# Patient Record
Sex: Female | Born: 1935 | Race: White | Hispanic: No | Smoking: Never smoker
Health system: Southern US, Community
[De-identification: ages and names within clinical notes are randomized; demographics above are authoritative.]

## PROBLEM LIST (undated history)

## (undated) DIAGNOSIS — I6789 Other cerebrovascular disease: Secondary | ICD-10-CM

## (undated) DIAGNOSIS — R159 Full incontinence of feces: Secondary | ICD-10-CM

## (undated) DIAGNOSIS — K623 Rectal prolapse: Secondary | ICD-10-CM

## (undated) DIAGNOSIS — Z860101 Personal history of adenomatous and serrated colon polyps: Secondary | ICD-10-CM

## (undated) DIAGNOSIS — I10 Essential (primary) hypertension: Secondary | ICD-10-CM

## (undated) DIAGNOSIS — Z87440 Personal history of urinary (tract) infections: Secondary | ICD-10-CM

## (undated) DIAGNOSIS — E039 Hypothyroidism, unspecified: Secondary | ICD-10-CM

## (undated) DIAGNOSIS — E7800 Pure hypercholesterolemia, unspecified: Secondary | ICD-10-CM

## (undated) DIAGNOSIS — F329 Major depressive disorder, single episode, unspecified: Secondary | ICD-10-CM

## (undated) DIAGNOSIS — I639 Cerebral infarction, unspecified: Secondary | ICD-10-CM

## (undated) DIAGNOSIS — K760 Fatty (change of) liver, not elsewhere classified: Secondary | ICD-10-CM

## (undated) DIAGNOSIS — F32A Depression, unspecified: Secondary | ICD-10-CM

## (undated) DIAGNOSIS — Z86010 Personal history of colonic polyps: Secondary | ICD-10-CM

## (undated) DIAGNOSIS — G459 Transient cerebral ischemic attack, unspecified: Secondary | ICD-10-CM

## (undated) DIAGNOSIS — K648 Other hemorrhoids: Secondary | ICD-10-CM

## (undated) HISTORY — DX: Other hemorrhoids: K64.8

## (undated) HISTORY — PX: CHOLECYSTECTOMY: SHX55

## (undated) HISTORY — DX: Rectal prolapse: K62.3

## (undated) HISTORY — DX: Hypothyroidism, unspecified: E03.9

## (undated) HISTORY — DX: Personal history of adenomatous and serrated colon polyps: Z86.0101

## (undated) HISTORY — DX: Fatty (change of) liver, not elsewhere classified: K76.0

## (undated) HISTORY — DX: Transient cerebral ischemic attack, unspecified: G45.9

## (undated) HISTORY — DX: Full incontinence of feces: R15.9

## (undated) HISTORY — DX: Personal history of colonic polyps: Z86.010

## (undated) HISTORY — DX: Cerebral infarction, unspecified: I63.9

## (undated) HISTORY — PX: COLON SURGERY: SHX602

## (undated) HISTORY — PX: BACK SURGERY: SHX140

---

## 1998-02-06 ENCOUNTER — Other Ambulatory Visit: Admission: RE | Admit: 1998-02-06 | Discharge: 1998-02-06 | Payer: Self-pay | Admitting: Obstetrics and Gynecology

## 1999-04-25 ENCOUNTER — Other Ambulatory Visit: Admission: RE | Admit: 1999-04-25 | Discharge: 1999-04-25 | Payer: Self-pay | Admitting: Obstetrics and Gynecology

## 2000-01-14 ENCOUNTER — Emergency Department (HOSPITAL_COMMUNITY): Admission: EM | Admit: 2000-01-14 | Discharge: 2000-01-14 | Payer: Self-pay

## 2000-01-20 ENCOUNTER — Other Ambulatory Visit: Admission: RE | Admit: 2000-01-20 | Discharge: 2000-01-20 | Payer: Self-pay | Admitting: Obstetrics and Gynecology

## 2000-01-22 ENCOUNTER — Encounter: Payer: Self-pay | Admitting: Obstetrics and Gynecology

## 2000-01-22 ENCOUNTER — Encounter: Admission: RE | Admit: 2000-01-22 | Discharge: 2000-01-22 | Payer: Self-pay | Admitting: Obstetrics and Gynecology

## 2001-03-24 ENCOUNTER — Other Ambulatory Visit: Admission: RE | Admit: 2001-03-24 | Discharge: 2001-03-24 | Payer: Self-pay | Admitting: Obstetrics and Gynecology

## 2001-11-24 ENCOUNTER — Encounter: Payer: Self-pay | Admitting: Otolaryngology

## 2001-11-24 ENCOUNTER — Encounter: Admission: RE | Admit: 2001-11-24 | Discharge: 2001-11-24 | Payer: Self-pay | Admitting: Otolaryngology

## 2002-04-25 ENCOUNTER — Other Ambulatory Visit: Admission: RE | Admit: 2002-04-25 | Discharge: 2002-04-25 | Payer: Self-pay | Admitting: Gynecology

## 2003-04-15 ENCOUNTER — Emergency Department (HOSPITAL_COMMUNITY): Admission: EM | Admit: 2003-04-15 | Discharge: 2003-04-15 | Payer: Self-pay | Admitting: Emergency Medicine

## 2003-04-27 ENCOUNTER — Ambulatory Visit (HOSPITAL_COMMUNITY): Admission: RE | Admit: 2003-04-27 | Discharge: 2003-04-27 | Payer: Self-pay | Admitting: *Deleted

## 2003-04-27 ENCOUNTER — Encounter (INDEPENDENT_AMBULATORY_CARE_PROVIDER_SITE_OTHER): Payer: Self-pay | Admitting: Specialist

## 2003-04-30 ENCOUNTER — Emergency Department (HOSPITAL_COMMUNITY): Admission: EM | Admit: 2003-04-30 | Discharge: 2003-04-30 | Payer: Self-pay | Admitting: Emergency Medicine

## 2003-04-30 ENCOUNTER — Encounter: Payer: Self-pay | Admitting: Emergency Medicine

## 2004-05-01 ENCOUNTER — Other Ambulatory Visit: Admission: RE | Admit: 2004-05-01 | Discharge: 2004-05-01 | Payer: Self-pay | Admitting: Gynecology

## 2004-05-29 ENCOUNTER — Encounter: Admission: RE | Admit: 2004-05-29 | Discharge: 2004-05-29 | Payer: Self-pay | Admitting: Otolaryngology

## 2006-02-12 ENCOUNTER — Encounter (INDEPENDENT_AMBULATORY_CARE_PROVIDER_SITE_OTHER): Payer: Self-pay | Admitting: *Deleted

## 2006-02-13 ENCOUNTER — Encounter (INDEPENDENT_AMBULATORY_CARE_PROVIDER_SITE_OTHER): Payer: Self-pay | Admitting: Specialist

## 2006-02-13 ENCOUNTER — Inpatient Hospital Stay (HOSPITAL_COMMUNITY): Admission: EM | Admit: 2006-02-13 | Discharge: 2006-02-16 | Payer: Self-pay | Admitting: Emergency Medicine

## 2006-06-29 ENCOUNTER — Other Ambulatory Visit: Admission: RE | Admit: 2006-06-29 | Discharge: 2006-06-29 | Payer: Self-pay | Admitting: Gynecology

## 2006-09-01 HISTORY — PX: APPENDECTOMY: SHX54

## 2006-12-21 ENCOUNTER — Ambulatory Visit (HOSPITAL_BASED_OUTPATIENT_CLINIC_OR_DEPARTMENT_OTHER): Admission: RE | Admit: 2006-12-21 | Discharge: 2006-12-21 | Payer: Self-pay | Admitting: Gynecology

## 2006-12-21 ENCOUNTER — Encounter (INDEPENDENT_AMBULATORY_CARE_PROVIDER_SITE_OTHER): Payer: Self-pay | Admitting: Specialist

## 2007-01-14 ENCOUNTER — Emergency Department (HOSPITAL_COMMUNITY): Admission: EM | Admit: 2007-01-14 | Discharge: 2007-01-15 | Payer: Self-pay | Admitting: Emergency Medicine

## 2007-01-16 ENCOUNTER — Inpatient Hospital Stay (HOSPITAL_COMMUNITY): Admission: EM | Admit: 2007-01-16 | Discharge: 2007-01-19 | Payer: Self-pay | Admitting: Emergency Medicine

## 2007-05-03 HISTORY — PX: ABDOMINAL HYSTERECTOMY: SHX81

## 2007-05-17 ENCOUNTER — Encounter (INDEPENDENT_AMBULATORY_CARE_PROVIDER_SITE_OTHER): Payer: Self-pay | Admitting: Gynecology

## 2007-05-17 ENCOUNTER — Inpatient Hospital Stay (HOSPITAL_COMMUNITY): Admission: RE | Admit: 2007-05-17 | Discharge: 2007-05-18 | Payer: Self-pay | Admitting: Gynecology

## 2007-09-16 ENCOUNTER — Ambulatory Visit: Payer: Self-pay | Admitting: Internal Medicine

## 2007-09-21 ENCOUNTER — Ambulatory Visit: Payer: Self-pay | Admitting: Internal Medicine

## 2007-09-21 ENCOUNTER — Encounter: Payer: Self-pay | Admitting: Internal Medicine

## 2008-07-11 DIAGNOSIS — G4730 Sleep apnea, unspecified: Secondary | ICD-10-CM | POA: Insufficient documentation

## 2008-07-11 DIAGNOSIS — R159 Full incontinence of feces: Secondary | ICD-10-CM | POA: Insufficient documentation

## 2008-07-11 DIAGNOSIS — Z860100 Personal history of colon polyps, unspecified: Secondary | ICD-10-CM | POA: Insufficient documentation

## 2008-07-11 DIAGNOSIS — I10 Essential (primary) hypertension: Secondary | ICD-10-CM

## 2008-07-11 DIAGNOSIS — K623 Rectal prolapse: Secondary | ICD-10-CM | POA: Insufficient documentation

## 2008-07-11 DIAGNOSIS — E039 Hypothyroidism, unspecified: Secondary | ICD-10-CM

## 2008-07-14 ENCOUNTER — Ambulatory Visit: Payer: Self-pay | Admitting: Internal Medicine

## 2008-09-01 HISTORY — PX: HEMORRHOID SURGERY: SHX153

## 2008-09-10 ENCOUNTER — Encounter (INDEPENDENT_AMBULATORY_CARE_PROVIDER_SITE_OTHER): Payer: Self-pay | Admitting: Surgery

## 2008-09-11 ENCOUNTER — Ambulatory Visit (HOSPITAL_COMMUNITY): Admission: RE | Admit: 2008-09-11 | Discharge: 2008-09-12 | Payer: Self-pay | Admitting: Surgery

## 2009-06-04 ENCOUNTER — Encounter: Admission: RE | Admit: 2009-06-04 | Discharge: 2009-06-04 | Payer: Self-pay | Admitting: Neurology

## 2010-05-02 ENCOUNTER — Encounter: Payer: Self-pay | Admitting: Emergency Medicine

## 2010-05-03 ENCOUNTER — Encounter: Admission: RE | Admit: 2010-05-03 | Discharge: 2010-05-03 | Payer: Self-pay | Admitting: Neurology

## 2010-05-03 ENCOUNTER — Inpatient Hospital Stay (HOSPITAL_COMMUNITY): Admission: AD | Admit: 2010-05-03 | Discharge: 2010-05-04 | Payer: Self-pay | Admitting: Neurology

## 2010-05-04 ENCOUNTER — Encounter (INDEPENDENT_AMBULATORY_CARE_PROVIDER_SITE_OTHER): Payer: Self-pay | Admitting: Diagnostic Neuroimaging

## 2010-05-04 ENCOUNTER — Encounter (INDEPENDENT_AMBULATORY_CARE_PROVIDER_SITE_OTHER): Payer: Self-pay | Admitting: Neurology

## 2010-05-07 ENCOUNTER — Ambulatory Visit (HOSPITAL_COMMUNITY): Admission: RE | Admit: 2010-05-07 | Discharge: 2010-05-07 | Payer: Self-pay | Admitting: Neurology

## 2010-05-07 ENCOUNTER — Encounter (INDEPENDENT_AMBULATORY_CARE_PROVIDER_SITE_OTHER): Payer: Self-pay | Admitting: Neurology

## 2010-07-13 ENCOUNTER — Emergency Department (HOSPITAL_COMMUNITY): Admission: EM | Admit: 2010-07-13 | Discharge: 2010-07-13 | Payer: Self-pay | Admitting: Emergency Medicine

## 2010-11-14 LAB — LIPID PANEL
HDL: 56 mg/dL (ref >39)
LDL Cholesterol: 115 mg/dL — ABNORMAL HIGH (ref 0–99)
Triglycerides: 151 mg/dL — ABNORMAL HIGH (ref <150)
VLDL: 30 mg/dL (ref 0–40)

## 2010-11-14 LAB — COMPREHENSIVE METABOLIC PANEL
ALT: 17 U/L (ref 0–35)
AST: 26 U/L (ref 0–37)
Alkaline Phosphatase: 43 U/L (ref 39–117)
BUN: 14 mg/dL (ref 6–23)
CO2: 29 mEq/L (ref 19–32)
Calcium: 9.7 mg/dL (ref 8.4–10.5)
Chloride: 103 mEq/L (ref 96–112)
Creatinine, Ser: 0.78 mg/dL (ref 0.4–1.2)
GFR calc Af Amer: >60 mL/min (ref >60)
GFR calc non Af Amer: >60 mL/min (ref >60)
Glucose, Bld: 100 mg/dL — ABNORMAL HIGH (ref 70–99)
Glucose, Bld: 107 mg/dL — ABNORMAL HIGH (ref 70–99)
Potassium: 3.4 mEq/L — ABNORMAL LOW (ref 3.5–5.1)
Sodium: 138 mEq/L (ref 135–145)
Sodium: 139 mEq/L (ref 135–145)
Total Bilirubin: 0.9 mg/dL (ref 0.3–1.2)
Total Bilirubin: 0.6 mg/dL (ref 0.3–1.2)

## 2010-11-14 LAB — URINALYSIS, ROUTINE W REFLEX MICROSCOPIC
Bilirubin Urine: NEGATIVE
Glucose, UA: NEGATIVE mg/dL
Hgb urine dipstick: NEGATIVE
Ketones, ur: NEGATIVE mg/dL
Urobilinogen, UA: 0.2 mg/dL (ref 0.0–1.0)

## 2010-11-14 LAB — CBC
HCT: 38.4 % (ref 36.0–46.0)
Hemoglobin: 13.2 g/dL (ref 12.0–15.0)
MCH: 31.3 pg (ref 26.0–34.0)
MCV: 89.8 fL (ref 78.0–100.0)
Platelets: 187 10*3/uL (ref 150–400)
Platelets: 168 10*3/uL (ref 150–400)
WBC: 5.1 10*3/uL (ref 4.0–10.5)

## 2010-11-14 LAB — DIFFERENTIAL
Basophils Absolute: 0 10*3/uL (ref 0.0–0.1)
Eosinophils Absolute: 0.1 10*3/uL (ref 0.0–0.7)
Lymphocytes Relative: 17 % (ref 12–46)
Monocytes Absolute: 0.5 10*3/uL (ref 0.1–1.0)
Monocytes Relative: 8 % (ref 3–12)
Neutro Abs: 4.4 10*3/uL (ref 1.7–7.7)

## 2010-11-14 LAB — PROTIME-INR: Prothrombin Time: 12.8 seconds (ref 11.6–15.2)

## 2010-11-18 ENCOUNTER — Emergency Department (HOSPITAL_COMMUNITY)
Admission: EM | Admit: 2010-11-18 | Discharge: 2010-11-18 | Disposition: A | Discharge status: Home / Self Care | Payer: Medicare Other | Attending: Emergency Medicine | Admitting: Emergency Medicine

## 2010-11-18 ENCOUNTER — Emergency Department (HOSPITAL_COMMUNITY): Payer: Medicare Other

## 2010-11-18 DIAGNOSIS — R209 Unspecified disturbances of skin sensation: Secondary | ICD-10-CM | POA: Insufficient documentation

## 2010-11-18 DIAGNOSIS — E789 Disorder of lipoprotein metabolism, unspecified: Secondary | ICD-10-CM | POA: Insufficient documentation

## 2010-11-18 DIAGNOSIS — R5381 Other malaise: Secondary | ICD-10-CM | POA: Insufficient documentation

## 2010-11-18 DIAGNOSIS — I2510 Atherosclerotic heart disease of native coronary artery without angina pectoris: Secondary | ICD-10-CM | POA: Insufficient documentation

## 2010-11-18 DIAGNOSIS — Z8673 Personal history of transient ischemic attack (TIA), and cerebral infarction without residual deficits: Secondary | ICD-10-CM | POA: Insufficient documentation

## 2010-11-18 DIAGNOSIS — E871 Hypo-osmolality and hyponatremia: Secondary | ICD-10-CM | POA: Insufficient documentation

## 2010-11-18 DIAGNOSIS — R5383 Other fatigue: Secondary | ICD-10-CM | POA: Insufficient documentation

## 2010-11-18 DIAGNOSIS — R51 Headache: Secondary | ICD-10-CM | POA: Insufficient documentation

## 2010-11-18 LAB — COMPREHENSIVE METABOLIC PANEL
Albumin: 3.9 g/dL (ref 3.5–5.2)
Calcium: 9.6 mg/dL (ref 8.4–10.5)
Chloride: 96 mEq/L (ref 96–112)
Creatinine, Ser: 0.78 mg/dL (ref 0.4–1.2)
Potassium: 3.7 mEq/L (ref 3.5–5.1)

## 2010-11-18 LAB — DIFFERENTIAL
Basophils Absolute: 0 10*3/uL (ref 0.0–0.1)
Basophils Relative: 0 % (ref 0–1)
Eosinophils Relative: 3 % (ref 0–5)
Lymphs Abs: 1.2 10*3/uL (ref 0.7–4.0)
Monocytes Absolute: 0.8 10*3/uL (ref 0.1–1.0)
Monocytes Relative: 14 % — ABNORMAL HIGH (ref 3–12)
Neutro Abs: 3.2 10*3/uL (ref 1.7–7.7)

## 2010-11-18 LAB — POCT CARDIAC MARKERS
Myoglobin, poc: 49.1 ng/mL (ref 12–200)
Troponin i, poc: <0.05 ng/mL (ref 0.00–0.09)

## 2010-11-18 LAB — CBC
HCT: 39.7 % (ref 36.0–46.0)
Hemoglobin: 13.6 g/dL (ref 12.0–15.0)
MCH: 30.3 pg (ref 26.0–34.0)
MCV: 88.4 fL (ref 78.0–100.0)
Platelets: 199 10*3/uL (ref 150–400)
WBC: 5.4 10*3/uL (ref 4.0–10.5)

## 2010-12-16 LAB — BASIC METABOLIC PANEL
BUN: 11 mg/dL (ref 6–23)
Chloride: 97 mEq/L (ref 96–112)
Creatinine, Ser: 0.79 mg/dL (ref 0.4–1.2)
GFR calc Af Amer: >60 mL/min (ref >60)
GFR calc non Af Amer: >60 mL/min (ref >60)
Potassium: 4 mEq/L (ref 3.5–5.1)

## 2010-12-16 LAB — HEMOGLOBIN AND HEMATOCRIT, BLOOD: HCT: 39.3 % (ref 36.0–46.0)

## 2011-01-14 NOTE — Assessment & Plan Note (Signed)
[facility]                                 ON-CALL NOTE   NAME:JONESAndrell, Tallman                           MRN:          [id number]  DATE:09/21/2007                            DOB:          [birth year]    CHIEF COMPLAINT:  Fever.   Ms. Boulier had a colonoscopy this morning at about 8 o'clock she tells. A  polyp was removed. She has some slight chills, headache and took her  temperature and it was 100.1 and 100.2.  There is no abdominal pain,  vomiting or bowel habit changes at this point, she has not vomited.  There are no other focal signs or symptoms.   ASSESSMENT:  Low grade temperature elevation, not really a fever at this  point. I wonder if she is not developing a viral syndrome. A lack of  abdominal pain is reassuring.   PLAN:  I told her it was okay to take some Tylenol. She is to call back  if she has further fever, IE, true fever with elevation above this,  abdominal pain or any other questions or concerns at this point.     Iva Boop, MD,FACG  Electronically Signed    CEG/MedQ  DD: 09/21/2007  DT: 09/21/2007  Job #: [id number]   cc:   Hedwig Morton. Juanda Chance, MD

## 2011-01-14 NOTE — Discharge Summary (Signed)
NAME:  Kathryn [name], Kathryn [name] NO.:  [id number]   MEDICAL RECORD NO.:  [id number]          PATIENT TYPE:  INP   LOCATION:  1526                         FACILITY:  [facility]   PHYSICIAN:  Andres Shad. Rudean Curt, MD     DATE OF BIRTH:  February 18, 1931   DATE OF ADMISSION:  01/16/2007  DATE OF DISCHARGE:  01/19/2007                               DISCHARGE SUMMARY   DISCHARGE DIAGNOSES:  1. Hyponatremia.  2. Syndrome of inappropriate antidiuretic hormone secretion.  3. Hallucinations.  4. Metabolic encephalopathy.  5. Anxiety.  6. Insomnia.   DISCHARGE MEDICATIONS:  1. Ambien 5 to 10 mg p.o. at bedtime as needed.  2. Trazodone 50 mg p.o. at bedtime.  3. Zyrtec.  4. Prinivil 20 mg daily.  5. Klonopin 0.5 mg p.o. at bedtime as needed.  6. The patient has been told to discontinue Prinzide and to take      Prinivil instead.   SUMMARY OF HOSPITALIZATION:  Ms. Bors is a female with a past medical  history that includes hypertension and insomnia.  She presented to the  emergency department on Jan 16, 2007 complaining of visual and auditory  hallucinations.  Her evaluation was notable for hyponatremia with a  serum sodium of 126.  Additionally, she complained of insomnia, not  having slept for the past week.  Of note, the patient says that she  chronically takes Klonopin, but she has been out of it for 3 weeks.   The patient was admitted to the floor where Klonopin was restarted and  she was also given Ambien, which she also normally takes at home.  She  was treated with intravenous saline and her diuretic was held and her  sodium improved to 133 by the time of discharge.  Her hallucinations  lasted another day or 2 before completely resolving.   I began the patient on trazodone 50 mg nightly for the treatment of  insomnia.  This seemed to help, and by the night before discharge the  patient had 7 hours of continuous, good quality sleep.  The following  morning, she denied any  hallucinations and felt ready for discharge.  A  urine osmolality was checked, and this was 352 as compared to a  calculated serum osmolality of approximately 270.  This, in the context  of euvolemic state, suggested the diagnosis of SIADH.  Thus, I felt that  she should avoid using the Dyazide diuretic and be treated only with  Prinivil and possibly other blood pressure medications once she is an  outpatient.   I consulted with Dr. Jeanie Sewer from psychiatry who agreed with the use  of trazodone to assist with sleep and expressed a goal of getting the  patient off of Klonopin at some point.  He felt that her hallucinations  were not due to an underlying psychiatric disorder, but more likely due  to her metabolic condition at the time of admission.  The details of his  evaluation can be found in his dictated consult note.   ASSESSMENT/PLAN:  1. Insomnia.  The patient will continue Ambien and  trazodone at home.      I have not refilled her Klonopin.  I will leave this up to her      primary care physician.  2. Hyponatremia and syndrome of inappropriate antidiuretic hormone      secretion.  I have given the patient a prescription for Prinivil 20      mg daily and I have instructed her to stop taking the Prinzide      because the diuretic component may be worsening her hyponatremia.   FOLLOW-UP:  The patient has an appointment with Dr. Georgina Pillion the day  following discharge.  I will give Dr. Rosanne Ashing office a call and suggest  her electrolytes be rechecked in the morning.      Andres Shad. Rudean Curt, MD  Electronically Signed     PML/MEDQ  D:  01/19/2007  T:  01/19/2007  Job:  [id number]   cc:   Oley Balm. Georgina Pillion, M.D.  Fax: [phone]

## 2011-01-14 NOTE — Op Note (Signed)
NAMETORIANNA, JUNIO NO.:  [id number]   MEDICAL RECORD NO.:  [id number]          PATIENT TYPE:  INP   LOCATION:  1540                         FACILITY:  [facility]   PHYSICIAN:  Gretta Cool, M.D. DATE OF BIRTH:  [birth year]   DATE OF PROCEDURE:  05/17/2007  DATE OF DISCHARGE:                               OPERATIVE REPORT   PREOPERATIVE DIAGNOSIS:  Complex atypical hyperplasia of the  endometrium.   POSTOPERATIVE DIAGNOSIS:  Complex atypical hyperplasia of the  endometrium.   PROCEDURE:  1. Laparoscopy-assisted hysterectomy.  2. Bilateral salpingo-oophorectomy.   SURGEON:  Beather Arbour, MD   ASSISTANT:  Jeanine Luz, MD   ANESTHESIA:  General orotracheal.   DESCRIPTION OF PROCEDURE:  Under excellent anesthesia as above, with the  patient prepped and draped in Allen stirrups and a Foley catheter  draining her bladder, a subumbilical incision was made.  Veress cannula  was then introduced and pneumoperitoneum obtained.  At this point, the  Optiview port site was placed under direct vision.  There was no  evidence of any injury or abnormality at placement.  The 2 lateral ports  were then placed, one right and one left, under direct vision, also  Optiview-type port sites.  At this point, the adnexal pedicles were  grasped by grasping forceps and the Gyrus tripolar forceps used to  cauterize the infundibulopelvic pedicle.  Once the pedicle was  completely sealed, it was transected.  The round ligament was then also  transected and the dissection taken to the level of the uterine vessels.  At this point, the attention was turned to the vaginal portion of the  procedure, the pneumoperitoneum was allowed to escape and the patient  repositioned for the vaginal portion of the procedure.  The weighted  speculum was placed in the vagina and the cervix grasped with a single-  tooth tenaculum.  It was then progressively infiltrated with Xylocaine  without  epinephrine.  The mucosa was then pushed off the lower uterine  segment.  At this point, the cul-de-sac was entered and the uterosacral  ligaments clamped, cut and suture-tied.  At this point, the bladder was  advanced off the lower segment.  The cardinal ligament were then  clamped, cut, sutured and tied with 0 Vicryl.  At this point, the  uterine vessels were clamped, cut, sutured and tied with 0 Vicryl..  At  this point, a straight Masterson clamp was used to communicate the lower  dissection with the upper port sites and the uterus and tubes and  ovaries were removed.  The pedicle was then tied.  At this point, the  pelvic peritoneum was closed with a pursestring suture of 0 Novofil.  At  this point, cardinal uterosacral colposuspension was performed to  support the vaginal cuff with a single suture on each side of 0 Novofil.  At this point, the fascia was approximated anterior to posterior with a  running suture of #0 Vicryl.  At the end of the procedure, sponge needle  and lap counts were correct.  There were no complications.  The  attention was then turned to the abdominal portion of the procedure.  Again, the pelvic floor was evaluated again by laparoscopy; there was no  significant bleeding; all the  pedicles were dry.  The gas was then allowed to escape and incisions  closed with deep suture of 0 Vicryl and subcuticular closure of 5-0  Vicryl.  At the end of the procedure, sponge and lap counts were  correct.  No complications.  The patient was taken to the recovery room  in stable condition.           ______________________________  Gretta Cool, M.D.     CWL/MEDQ  D:  05/17/2007  T:  05/18/2007  Job:  [id number]

## 2011-01-14 NOTE — H&P (Signed)
NAMEMarland Kitchen  Kathryn [name], Kathryn [name] NO.:  [id number]   MEDICAL RECORD NO.:  [id number]          PATIENT TYPE:  INP   LOCATION:  1540                         [facility]ACILITY:  [facility]   PHYSICIAN:  Gretta Cool, M.D. DATE OF BIRTH:  [birth year]   DATE OF ADMISSION:  05/17/2007  DATE OF DISCHARGE:                              HISTORY & PHYSICAL   CHIEF COMPLAINT:  Atypical endometrial hyperplasia, complex hyperplasia   HISTORY OF PRESENT ILLNESS:  A 77 year old gravida 1, para 1.  She has a  history of unopposed estrogen therapy for a prolonged period of time by  her own choice.  She developed abnormal uterine bleeding, subsequently  had evaluation that revealed by D&C complex hyperplasia.  She had no  atypia on the initial endometrial sampling.  She was treated then with A  Justin  continuous for 3 months.  She had persistence of thickening.  She subsequently had hysteroscopy resection ablation.  On resection  ablation she had focal atypia  along with the complex hyperplasia.  She  now is admitted for definitive therapy by laparoscopic-assisted  hysterectomy, salpingo-oophorectomy.  She is under the primary care of  Dr. Georgina Pillion and Dr. [place] Callas.   PAST MEDICAL HISTORY:  Usual childhood diseases without sequelae.   MEDICAL ILLNESSES:  The patient has hypertension treated effectively on  Prinizide 24.5.  She also has a long history of anxiety.  She is on  Klonopin 0.5 for sleep and Ambien CR 12.5 q.h.s. for sleep.  She was  recently hospitalized for treatment and had an acute withdrawal syndrome  when her Klonopin  was discontinued abruptly.  She has also recently had  significant difficulty with jaw problem and dental problems requiring  extensive surgeries.  It is still in the process of treatment.  She has  osteopenia but is on no therapy other than low-dose B2 and calcium  magnesium and D.   PAST MEDICAL HISTORY:  History is otherwise unremarkable.  She has a  history of one  uneventful delivery.  She has history of cholecystectomy  in 1989 by Dr. Loralyn Freshwater, sinus surgery 2003 Dr. Lazarus Salines.   FAMILY HISTORY:  Father had stroke at advanced age 53.  Mother had a  aneurysm of the brain at age 62.  No other known familial tendency.   SOCIAL HISTORY:  The patient's husband died approximately 6 months ago.  She has had marked exacerbation of her emotional difficulties since that  time.   REVIEW OF SYSTEMS:  HEENT: Denies symptoms.  Denies asthma, cough,  bronchitis, shortness of breath.  GI: Denies frequency, urgency,  dysuria, change in bowel habits, food intolerance.   PHYSICAL EXAM:  Well-developed, well-nourished thin white female, weight  147, height 5 feet 2 inches.  HEENT: Pupils equal, react to light and  accommodation.  Fundi not examined.  Oropharynx clear.  NECK: Supple without masses or enlargement.  CHEST: Clear P2A.  HEART:  Regular rhythm without murmur or cardiac enlargement.  BREASTS: Without mass.  NOSE no discharge.  ABDOMEN: Soft, scaphoid without mass or organomegaly.  PELVIC:  External genitalia normal female,  vagina atrophic, diminished  estrogen effect.  Cervix is large and parous.  Uterus is tiny, fundus is  tiny, adnexa clear, nonpalpable.  Rectovaginal confirms.  EXTREMITIES: Negative.  NEUROLOGIC:  Physiologic.   IMPRESSION:  1. Complex atypical endometrial hyperplasia, persistent with high risk      of progression to endometrial adenocarcinoma.  2. Hypertension.  3. Overactive bladder  4. History of acute withdrawal syndrome from benzodiazepines.  5. History of low dose estrogen unopposed self-directed therapy.           ______________________________  Gretta Cool, M.D.     CWL/MEDQ  D:  05/17/2007  T:  05/17/2007  Job:  [id number]   cc:   Oley Balm. Georgina Pillion, M.D.  Fax: [phone]   Sidney Ace, M.D. [facility]  [address]  [place]  [place] [address]

## 2011-01-14 NOTE — Assessment & Plan Note (Signed)
[facility]                         GASTROENTEROLOGY OFFICE NOTE   NAME:JONESNeylan, Koroma                         MRN:          [id number]  DATE:09/16/2007                            DOB:          1931/01/21    Ms. Bunyan is a 77 year old white female who is here for evaluation of  rectal pain and irritation, which has been occurring with increased  frequency since her total abdominal hysterectomy and BSO in May 29, 2007.  Prior to the surgery, in 05/29/23, the patient was having some  rectal fullness and occasional bulging of the rectal mucosa, but it has  become more pronounced since May 29, 2023.  She has used hemorrhoidal  suppository given to her by Dr. Nicholas Lose, and has used probiotics and  Activa, which regulates her bowel movements, but she has daily problems  with rectal irritation, prolapse, which does not always reduce itself  spontaneously.  The patient had a full colonoscopy exam in 2004 by Dr.  Luther Parody.  She was told she had a twisted colon, but no polyps.  She  has occasional lower abdominal pain and occasional rectal bleeding.   MEDICATIONS:  1. Lisinopril hydrochlorothiazide 20/12.5 one p.o. daily.  2. Zyrtec 10 mg p.r.n.  3. Vitamin D with calcium.  4. Vivelle Dots.  5. Allergy shots.  6. Hydrocortisone suppositories nightly.  7. Ambien CR 12.5 mg at bedtime.  8. Klonopin 0.5 mg at bedtime.   PAST HISTORY:  Significant for appendectomy by Dr. Gerrit Friends in 2007.  She  had TAH/BSO in 09-27-2008cholecystectomy 1987.  The patient has  high blood pressure, allergies, sleep apnea.   FAMILY HISTORY:  Negative for colon cancer.   SOCIAL HISTORY:  She became a widow on August 24, 2007.  She works in  a [employer].  She does not smoke and does not drink alcohol.   REVIEW OF SYSTEMS:  Positive for allergies, sleeping problems, back  pain.   PHYSICAL EXAM:  Blood pressure 140/72, pulse 72, and weight 140 pounds.  She was alert and  oriented, in no distress.  She was somewhat depressed  when she talked about her husband.  LUNGS:  Clear to auscultation.  COR:  Normal S1, normal S2.  ABDOMEN:  Soft with minimal tenderness in the left lower quadrant.  Post  laparoscopic scars around the umbilicus.  There was no distension.  Rectal and anoscopic exam reveals normal-appearing anal area, somewhat  decreased rectal tone.  No evidence of hemorrhoids.  Prolapse of the  rectal tissue toward the rectal os, but not complete prolapse.  With  straining, the rectum did not come out as she describes at times.  Stool  was strongly Hemoccult positive.  The prolapsing mucosa was quite  erythematous and hyperemic.   IMPRESSION:  A 77 year old white female who has partial rectal prolapse,  which has become worse since hysterectomy, which has not responded so  far to topical treatment and avoidance of straining.  She is interested  in proceeding with further evaluation and treatment, even if it is  surgical treatment.   PLAN:  1. Resume Analpram  2.5% rectal cream to use daily.  2. Anusol HC suppositories nightly.  3. Colonoscopy scheduled to evaluate heme positive stool to make sure      there is no other structural problem in the left or transverse      colon.  4. The patient will need a most likely surgical repair of the rectal      prolapse.  I am not sure if Dr. Nicholas Lose will do that, or if she      wanted to be referred to Dr. Gerrit Friends, who did her appendectomy in      July 2007.  I was not also sure if possibly a vaginal pessary would      have an effect on reducing her rectal prolapse.     Hedwig Morton. Juanda Chance, MD  Electronically Signed    DMB/MedQ  DD: 09/16/2007  DT: 09/16/2007  Job #: [id number]   cc:   Oley Balm. Georgina Pillion, M.D.  Gretta Cool, M.D.

## 2011-01-14 NOTE — H&P (Signed)
NAME:  Kathryn [name], STEGEMAN NO.:  [id number]   MEDICAL RECORD NO.:  [id number]          PATIENT TYPE:  INP   LOCATION:  0102                         [facility]AC[facility]   PHYSICIAN:  Deirdre Peer. Polite, M.D. DATE OF BIRTH:  1931-03-21   DATE OF ADMISSION:  01/16/2007  DATE OF DISCHARGE:                              HISTORY & PHYSICAL   CHIEF COMPLAINT:  Hallucinations.   HISTORY OF PRESENT ILLNESS:  A 77 year old female with a known history  of hypertension, insomnia who comes to the ED with the above chief  complaint.  Note, the patient was seen in the ED on Thursday, because of  visual hallucinations, seeing things on the television that were not  there.  The patient states she had a CT of her head and had some labs  that also showed a mild hyponatremia.  The patient was discharged.  She  states that she saw someone at the [facility] office yesterday, had more  blood work, and was discharged.  The patient continued to have visual  hallucinations.  Denied any fever or chills.  The patient denies any new  medications, however, on further discussion with her shows that she has  been off her medicine, Klonopin, x3 weeks, a medicine that she has been  taking for approximately 10 years.  The patient also has been on  Prinizide which has HCTZ which may be responsible for her mild  hyponatremia.  In the ED today, she was evaluated.  She was afebrile.  She was  hypertensive with a BP of 178/88, pulse 81, respiratory rate of 20.  Labs showed a CBC within normal limits.  UA within normal limits.  B-MET  significant for a sodium of 126.  Creatinine 0.8.  Currently the E-chart  computer system is down and we are unable to review previous labs that  were done at [facility] last Thursday.  Admission is deemed  necessary for further evaluation and treatment.   PAST MEDICAL HISTORY:  1. Hypertension.  2. History of migraines.   MEDICATIONS:  1. Prinizide 20/12.5 mg.  2.  Ambien.  3. Zyrtec.  4. Klonopin, of note she has been without this x3 weeks.   SOCIAL HISTORY:  Negative for tobacco, alcohol, or drugs.   PAST SURGICAL HISTORY:  1. Cholecystectomy in the past.  2. States she had a GYN procedure done by Dr. Nicholas Lose approximately 2      weeks ago where she thinks the lining of her uterus was stripped.  3. She had an appendectomy one year ago.   ALLERGIES:  1. CODEINE.  2. IBUPROFEN.   REVIEW OF SYSTEMS:  As noted in HPI.   FAMILY HISTORY:  Noncontributory.   PHYSICAL EXAMINATION:  GENERAL:  The patient is alert and oriented x3.  VITAL SIGNS:  Temp 98.1, BP 170/84, pulse 68, respiratory rate of 16,  sat 96%.  HEENT:  Within normal limits.  CHEST:  Clear to auscultation bilateral, without rales, rhonchi, or  wheeze.  CARDIOVASCULAR;  Regular, no S3.  ABDOMEN:  Soft, nontender.  EXTREMITIES:  No clubbing,  cyanosis, or edema.  Two plus pulses.  NEUROLOGIC:  Cranial nerves II-XII intact.  Motor 5/5.  Deep tendon  reflexes symmetrical.  Tandem gait intact.  Negative Romberg.   ASSESSMENT:  1. Visual hallucination, currently without any in the emergency      department.  2. Mild hyponatremia with a sodium of 126, which does not appear to be      low enough for the above complaint.  3. History of migraines.  4. Insomnia x1 week, per the patient she has only slept five hours in      the last week.  5. History of chronic Klonopin use x10 years, without it x3 weeks.  6. Hypertension.   1. Recommend the patient be admitted to a medicine floor bed, where      she will be monitored.  2. We will hold her diuretic.  3. We will give gentle IV fluids.  4. If persistent hyponatremia, we will consider hyponatremia      evaluation.  However, her diuretic was more than likely the cause.  5. The patient's mental status change may be related to her going      without her Klonopin which she had been on chronically for greater      than 10 years, we will give  her a trial of Klonopin and also treat      her insomnia.  6. We will have a followup B-MET in the a.m.  7. Make further recommendations as deemed necessary.   Once E-chart is up, we will try to obtain old records from [facility], where she had blood work and CT of her head.  Per discussion  with ED doctor at that time it was presumed that her sodium was  approximately 129.      Deirdre Peer. Polite, M.D.  Electronically Signed     RDP/MEDQ  D:  01/16/2007  T:  01/16/2007  Job:  [id number]   cc:   Oley Balm. Georgina Pillion, M.D.  Fax: [phone]

## 2011-01-14 NOTE — Op Note (Signed)
NAME:  [name], [name] NO.:  [id number]   MEDICAL RECORD NO.:  [id number]          PATIENT TYPE:  AMB   LOCATION:  DAY                          FACILITY:  [facility]   PHYSICIAN:  Thornton Park. Daphine Deutscher, MD  DATE OF BIRTH:  October 25, 1930   DATE OF PROCEDURE:  09/11/2008  DATE OF DISCHARGE:                               OPERATIVE REPORT   PREOPERATIVE DIAGNOSIS:  Prolapsing hemorrhoids.   POSTOPERATIVE DIAGNOSIS:  Prolapsing hemorrhoids.   PROCEDURE:  Procedure for prolapsing hemorrhoids (PPH).   SURGEON:  Luretha Murphy, M.D.   ASSISTANT:  Baruch Merl, M.D.   ANESTHESIA:  General endotracheal.   DESCRIPTION OF PROCEDURE:  Ms. Kathryn [name] is a 76 year old white lady  taken to [facility] on Monday, September 11, 2008 and given general anesthesia.  She was placed in the prone position, jackknife and her perineal region  was first taped apart and then the perianal region and vagina were  prepped with a chlorhexidine equivalent.  I first injected the  hemorrhoidal columns with 9:1 lidocaine Wydase solution, 10 mL total.  Then I used 20 mL of just lidocaine into the anal sphincter with a  separate needle.   We then positioned the pursestring device within the anus and then I  made a pursestring at about 4 cm, taking just mucosal thickness bites.  Getting mucosa and submucosa, no muscle.  Once it was in place, it  looked like it was a good concentric ring and I felt this area through  the vagina and it did not appear to be impinging on the vagina.  I  checked it again after the stapler had been inserted across this and  tied down.  The device was pulled up and then closed to a maximum  compression of the staples, held for minute and then fired.  The staple  line was examined and was not bleeding and a piece of the Gelfoam was  placed.  The patient was awakened and taken to recovery room in  satisfactory addition.  We examined the ring and it was a nice complete  ring that had the  one large prolapsing hemorrhoid with it.  The patient  tolerated the procedure well and was taken to the recovery room in  satisfactory addition.      Thornton Park Daphine Deutscher, MD  Electronically Signed     MBM/MEDQ  D:  09/11/2008  T:  09/11/2008  Job:  [id number]   cc:   Oley Balm. Georgina Pillion, M.D.  Fax: [phone]   Hedwig Morton. Juanda Chance, MD  [address]  [place]  [place] [address]

## 2011-01-14 NOTE — Consult Note (Signed)
NAME:  SISTER, CARBONE NO.:  [id number]   MEDICAL RECORD NO.:  [id number]          PATIENT TYPE:  INP   LOCATION:  1526                         FACILITY:  [facility]   PHYSICIAN:  Antonietta Breach, M.D.  DATE OF BIRTH:  [birth year]   DATE OF CONSULTATION:  01/18/2007  DATE OF DISCHARGE:  01/19/2007                                 CONSULTATION   REQUESTING PHYSICIAN:  Andres Shad. Lantos, MD.   REASON FOR CONSULTATION:  Anxiety and rule out psychotic disorder.   HISTORY OF PRESENT ILLNESS:  Kathryn [name] is a 77 year old female  admitted to the [facility] on Jan 16, 2007, due to  hallucinations.  She was having visual hallucinations, seeing things on  the television that were not there.  She was having hallucinations of  the TV on when it was not on. She had good insight into these  experiences. She knew that they could not possibly be occurring, and she  was very disturbed by them.  She has not had depressed mood or  anhedonia.  She has not had difficulty concentrating.  She does have  intact memory. She is socially appropriate.  Her orientation is also  intact   A fading and resolution of the hallucinations has occurred while the  patient has had fluid replacement and correction of electrolyte  abnormalities.   PAST PSYCHIATRIC HISTORY:  The patient has no history of major  depression.  She has no history of mania, hallucinations or delusions.  She has had difficulty with excessive worry, feeling on edge, and  insomnia. She has not undergone psychiatric care or psychiatric  hospitalization. She has no history of suicide attempts.   She has been treated with Ambien for insomnia.  Also, she has been  treated with Klonopin at 0.5 mg daily for anti-anxiety. The Klonopin has  been discontinued for 3 weeks.   FAMILY PSYCHIATRIC HISTORY:  None known.   SOCIAL HISTORY:  Kathryn [name] has one son.  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  She does not use alcohol,  illegal drugs or tobacco. Marital status:  Widowed. She has been living  by herself comfortably.   GENERAL MEDICAL PROBLEMS:  Surgical history includes cholecystectomy and  dilatation and curettage. She has a history of appendectomy. Other  general medical problems include hypertension, history of migraines.   MEDICATIONS:  The MAR is reviewed.  1. The patient is on Klonopin 0.5 mg daily.  2. She is also on trazodone 50 mg nightly.  3. Ambien 10 mg nightly p.r.n.   ALLERGIES:  She has a allergies to CODEINE, PREDNISONE, IBUPROFEN,  CELEBREX and LATEX   LABORATORY DATA:  WBC 5.4, hemoglobin 13.1, platelet count 286.  TSH  1.55 which is within normal limits.  Her urinalysis was unremarkable.  The basic metabolic panel did reveal a decreased sodium at 129 and a  decreased potassium at 3.3.  These are being corrected.  The potassium  is now in normal range, and the sodium is increased to 133. This has  been  correlated with a resolution of her visual hallucinations.   Her head CT without contrast on May 15 showed no acute intracranial  abnormality.  There were chronic small vessel disease changes.   REVIEW OF SYSTEMS:  CONSTITUTIONAL:  Afebrile.  HEAD:  No trauma.  EYES:  No visual changes.  EARS:  No hearing impairment.  NOSE:  No rhinorrhea.  MOUTH/THROAT:  No sore throat.  NEUROLOGIC:  Unremarkable.  PSYCHIATRIC:  As above.  CARDIOVASCULAR:  No chest pain, palpitations or edema.  RESPIRATORY:  No coughing or wheezing.  GASTROINTESTINAL:  No nausea,  vomiting, diarrhea.  GENITOURINARY:  No dysuria.  SKIN:  Unremarkable.  ENDOCRINE/METABOLIC:  As above.  Electrolytes are correcting.  MUSCULOSKELETAL:  No deformities.  HEMATOLOGIC LYMPHATIC:  Unremarkable.   PHYSICAL EXAMINATION:  VITAL SIGNS:  Temperature 97.5, pulse 62,  respiration 20, blood pressure 122/68.   MENTAL STATUS EXAM:  Kathryn [name] is an elderly female appearing her  chronologic age  of 77, sitting up in her hospital bed with good eye  contact and appropriate grooming. She had is oriented completely to all  spheres.  Her memory is intact to immediate, recent, and remote.  Her  fund of knowledge and intelligence are within normal limits.  Her speech  involves normal rate and prosody. Thought process is logical, coherent,  goal directed.  No looseness of associations.  Thought content:  No  thoughts of harming herself, no thoughts of harming others. No  hallucinations, no delusions.  Affect is slightly anxious. Mood is  slightly anxious.  Her interests and future goals are within normal  limits.  Her insight is good.  Her judgment is intact.   ASSESSMENT:  AXIS I:  1. 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  2. (293.84) anxiety disorder not otherwise specified.  AXIS II:  None.  AXIS III:  See General Medical Problems.  AXIS IV:  General medical.  AXIS V:  55.   At this point, Kathryn [name]' psychotic experience appears to be secondary  to an older cholinergic deficient central nervous system exposed to  decreases in potassium and sodium in the blood stream   However, would check other reversible causes which will have unlikely  yield but are easy to assess such his RPR, liver function tests, B12,  and folic acid levels   The patient concurs with the utilization of trazodone for eliminating  insomnia as well as helping to eliminate the use of Klonopin eventually.  The trazodone does have serotonin reuptake inhibition. If the trazodone  is given long enough at dosing titrated to between 200  and 300 mg as  tolerated, excessive worry and feeling on edge could be reduced by 12-16  weeks.   The patient understands the risks of taking Klonopin. Would continue the  Klonopin low dose as is done now with the goal of eliminating it as the  trazodone takes affect  Would increase trazodone as tolerated by 25  mg nightly to the initial  trial dose of 200 mg nightly.   Would utilize Ambien nightly p.r.n. insomnia while the trazodone is  titrated and then discontinue the Ambien once the insomnia is resolved.   Concerning outpatient treatment, the patient could be followed by  psychiatry at one of the clinics attached to [facility], [place], or [facility].   Of note, the patient would benefit from psychotropic medication  management and would particularly benefit from  psychotherapy involving  cognitive behavioral therapy and progressive muscle relaxation as well  as deep breathing. These psychotherapy techniques could help reduce the  patient's need for Klonopin.      Antonietta Breach, M.D.  Electronically Signed     JW/MEDQ  D:  01/24/2007  T:  01/24/2007  Job:  [id number]

## 2011-01-17 NOTE — Op Note (Signed)
NAMEMarland Kitchen  BLONDINE, HOTTEL NO.:  [id number]   MEDICAL RECORD NO.:  [id number]          PATIENT TYPE:  INP   LOCATION:  3012                         FACILITY:  [facility]   PHYSICIAN:  Velora Heckler, MD      DATE OF BIRTH:  1931/08/16   DATE OF PROCEDURE:  02/13/2006  DATE OF DISCHARGE:                                 OPERATIVE REPORT   PREOPERATIVE DIAGNOSIS:  Acute appendicitis.   POSTOPERATIVE DIAGNOSIS:  Acute appendicitis.   PROCEDURE:  Laparoscopic appendectomy.   SURGEON:  Velora Heckler, MD, FACS   ANESTHESIA:  General per Dr. Randa Evens.   ESTIMATED BLOOD LOSS:  Minimal.   PREPARATION:  Betadine.   COMPLICATIONS:  None.   INDICATIONS:  The patient is a 77 year old white female who presents to the  emergency department with a 24-hour history of abdominal pain localized to  the right lower quadrant.  CT scan of abdomen and pelvis confirms acute  appendicitis.  The patient is now prepared and brought to the operating  room.   BODY OF REPORT:  Procedure is done in OR #16 at [facility].  The patient is brought to the operating room and placed in a  supine position on the operating room table.  Following the administration  of general anesthesia, the patient is prepped and draped in the usual strict  aseptic fashion.  After ascertaining that an adequate level of anesthesia  had been obtained, an infraumbilical incision is made with a #15 blade.  Dissection is carried down to the fascia.  The fascia is incised in the  midline and the peritoneal cavity is entered cautiously.  A 0 Vicryl  pursestring suture is placed in the fascia.  An Hasson cannula is introduced  and secured with a pursestring suture.  The abdomen is insufflated with  carbon dioxide.  The laparoscope is introduced and the abdomen explored.  Operative ports are placed in the right upper quadrant and left lower  quadrant.  Cecum is mobilized.  There is an acutely inflamed,  partially  necrotic appendix which is adherent to the right pelvic sidewall.  With  gentle blunt dissection, it is mobilized.  Mesoappendix is then taken down  using the harmonic scalpel.  The base of the appendix is moderately  necrotic, but after some gentle dissection, I was able to expose what  appears to be a relatively normal appendix at its junction with the cecal  wall.  The mesoappendix is completely dissected off using the harmonic  scalpel.  The base of the appendix is then transected using an Endo-GIA  stapler with a vascular cartridge.  Staple line shows good hemostasis.  Appendix is placed into an EndoCatch bag and withdrawn through the left  lower quadrant port without difficulty.  Abdomen and pelvis are irrigated  with warm saline, which is evacuated.  There is good hemostasis noted.  Ports are removed under direct vision and pneumoperitoneum is released.  The  0 Vicryl pursestring suture is tied securely.  Port sites are anesthetized  with local anesthetic.  All wounds are closed with  interrupted 4-0 Vicryl subcuticular sutures.  Wounds are washed and dried  and Benzoin and Steri-Strips are applied.  Sterile dressings are applied.  The patient is awakened from anesthesia and brought to the recovery room in  stable condition.  The patient tolerated the procedure well.      Velora Heckler, MD  Electronically Signed     TMG/MEDQ  D:  02/13/2006  T:  02/13/2006  Job:  [id number]   cc:   Oley Balm. Georgina Pillion, M.D.  Fax: [phone]

## 2011-01-17 NOTE — Discharge Summary (Signed)
NAME:  DECIE, VERNE NO.:  [id number]   MEDICAL RECORD NO.:  [id number]          PATIENT TYPE:  INP   LOCATION:  1540                         [facility]ACILITY:  [facility]   PHYSICIAN:  Gretta Cool, M.D. DATE OF BIRTH:  Sep 26, 1930   DATE OF ADMISSION:  05/17/2007  DATE OF DISCHARGE:  05/18/2007                               DISCHARGE SUMMARY   HISTORY OF PRESENT ILLNESS:  Kathryn [name] is a 77 year old female, gravida  1, para 1, who has a history of unopposed estrogen therapy for a  prolonged period of time, by her own choice.  She developed abnormal  uterine bleeding and has had evaluation by D and C, which showed complex  hyperplasia.  There is no atypia on the initial endometrial sampling.  She was treated with Aygestin continuously for three months.  She had  persistent endometrial thickening.  She subsequently had hysteroscopy  with resection/ablation.  On resection, there was focal atypia, along  with a complex hyperplasia.  She is now admitted for definitive therapy  by laparoscopically-assisted hysterectomy, salpingo-oophorectomy.   She is under the primary care of Dr. Georgina Pillion and Dr.  Callas for  hypertension.   ADMISSION EXAM:  Well-developed, well-nourished, thin, white female.  HEAD, EYES, EARS, NOSE AND THROAT:  Pupils were equal, round and  reactive to light and accommodation.  Fundi not examined.  Oropharynx  was clear.  NECK:  The neck is supple without masses or enlargement.  CHEST:  Clear to auscultation and percussion.  HEART:  Rate and rhythm were regular, without murmur, gallop or cardiac  enlargement.  BREASTS:  Soft without masses, nodes or nipple discharge.  ABDOMEN:  Soft, scaphoid, without masses or organomegaly.  PELVIC EXAM:  External genitalia within normal limits for female.  Vagina is atrophic with diminished estrogen effect.  The cervix is large  and parous.  The uterus is tiny.  Adnexa bilaterally clear, nonpalpable.  RECTOVAGINAL EXAM:   Confirms.   IMPRESSION:  1. Complex atypical endometrial hyperplasia, persistent, with high      risk of progesterone to endometrial adenocarcinoma.  2. Hypertension.  3. Overactive bladder.  4. History of acute withdrawal syndrome from benzodiazepines.  5. History of low-dose estrogen, unopposed, which is self-directed.   PLAN:  Definitive therapy by laparoscopically-assisted hysterectomy and  salpingo-oophorectomy.  Risks and benefits were discussed with the  patient.  She accepts these procedures.   LABORATORY DATA:  Admission hemoglobin 12.9, hematocrit 37.1.  The  remainder of her preoperative laboratory work was within normal limits,  with the exception of a mildly elevated glucose of 101.  Postoperative  hemoglobin was 11.3, hematocrit 32.3.   ECG:  Normal sinus rhythm with septal infarct, age undetermined.   HOSPITAL COURSE:  Patient underwent laparoscopically-assisted vaginal  hysterectomy, bilateral salpingo-oophorectomy under general anesthesia.  Procedures were completed without any complications and the patient was  returned to the recovery room in excellent condition.  Pathology report:  Focal complex and simple hyperplasia without atypia.  No malignancy  identified.  Endometrium entirely submitted for evaluation.  Adenomyosis, Nabothian cyst at the cervix and  the bilateral ovaries and  tubes were unremarkable.   Her postoperative course was without complications and she was  discharged on the first postoperative day, in excellent condition.   FINAL DISCHARGE INSTRUCTIONS:  Included no heavy lifting or straining,  no vaginal entrance, and to increase ambulation as tolerated.  She is to  call if any fever of over 100.4 or failure of daily improvement.   DIET:  Regular.   MEDICATIONS:  She is to resume her preoperative medications.  Pain  medication given was Tylox one p.o. q. 4 hours p.r.n. discomfort.   She is to return to the office in one week for  followup.   CONDITION ON DISCHARGE:  Excellent.   FINAL DISCHARGE DIAGNOSES:  1. Complex atypical endometrial hyperplasia, persistent.  2. Both complex and simple hyperplasia, without atypia, on the      pathology report.   PROCEDURES PERFORMED:  Laparoscopically-assisted hysterectomy, bilateral  salpingo-oophorectomy, under general anesthesia.      Matt Holmes, N.P.    ______________________________  Gretta Cool, M.D.    EMK/MEDQ  D:  06/09/2007  T:  06/09/2007  Job:  [id number]   cc:   Oley Balm. Georgina Pillion, M.D.  Fax: [phone]   Sidney Ace, M.D. [facility]  [address]  [place]  [place] [address]

## 2011-01-17 NOTE — Op Note (Signed)
NAME:  Kathryn, [name] NO.:  [id number]   MEDICAL RECORD NO.:  [id number]          PATIENT TYPE:  AMB   LOCATION:  NESC                         FACILITY:  [facility]   PHYSICIAN:  Gretta Cool, M.D. DATE OF BIRTH:  11/15/1930   DATE OF PROCEDURE:  12/21/2006  DATE OF DISCHARGE:                               OPERATIVE REPORT   PREOPERATIVE DIAGNOSIS:  Complex hyperplasia, persistent after long-term  progesterone therapy.   POSTOPERATIVE DIAGNOSIS:  Complex hyperplasia, persistent after long-  term progesterone therapy.   PROCEDURES:  Hysteroscopy resection, total endometrial resection and  ablation.   SURGEON:  Gretta Cool, M.D.   ANESTHESIA:  LMA General.   DESCRIPTION OF PROCEDURE:  Under excellent anesthesia as above with the  patient prepped and draped in lithotomy position in Waller stirrups, the  cervix was grasped with single-tooth tenaculum, pulled down and viewed.  The cervix was then progressively dilated with a series of Pratt  dilators to accommodate and 7 mm resectoscope.  The resectoscope was  then introduced. There was no residual polyp or significant thickening  of the uterine lining noted anywhere.  The cornu areas were resected  first. The entire endometrial cavity was then also resected so as to  remove all the endometrial tissue out into the muscle wall of the uterus  an estimated 5 mm.  The cornual areas were treated by VaporTrode touch  technique so as to eliminate any endometrial tissue remaining in the  cornual areas. The entire cavity was then also treated by VaporTrode so  as to eliminate any residual endometrial tissue in the superficial  myometrium.  Once the entire cavity was treated at reduced pressure  under 30 mL, there was no residual bleeding of significance.  At this  point, the procedure was terminated without complication.  The patient  was returned to the recovery room in excellent condition.     ______________________________  Gretta Cool, M.D.     CWL/MEDQ  D:  12/21/2006  T:  12/21/2006  Job:  [id number]   cc:   Oley Balm. Georgina Pillion, M.D.  Fax: [phone]

## 2011-01-17 NOTE — Discharge Summary (Signed)
NAME:  Kathryn [name], Kathryn [name] NO.:  [id number]   MEDICAL RECORD NO.:  [id number]          PATIENT TYPE:  INP   LOCATION:  3012                         FACILITY:  [facility]   PHYSICIAN:  Revonda Standard L. Rennis Harding, N.P. DATE OF BIRTH:  04-27-1931   DATE OF ADMISSION:  02/12/2006  DATE OF DISCHARGE:  02/16/2006                                 DISCHARGE SUMMARY   CHIEF COMPLAINT/REASON FOR ADMISSION:  Kathryn [name] is a 77 year old female  patient who presented to the ER after 24 hours worth of abdominal pain  radiating to the right lower quadrant.  The patient has been anorexic but  she has not had any vomiting.  She has noted sweats and chills and headache.  She presented to the ER where she was found to have a white count of 11,000  with a left shift of 85% neutrophils.  CT scan of the abdomen and pelvis was  done which demonstrated findings consistent with acute appendicitis.   Upon Dr. Kristen Cardinal evaluation of the patient in the ER, the patient had a  temperature of 101.9.  Vital signs otherwise were stable.  Her abdominal  exam demonstrated an abdomen that was soft without distention.  Bowel sounds  were present.  There was tenderness to percussion and palpation particularly  in the right lower quadrant, voluntary guarding, no mass, no rebound  tenderness.  The patient was admitted by Dr. Karle Starch with a diagnosis of  acute appendicitis.   HOSPITAL COURSE:  The patient was taken to the OR from the ER on the date of  admission for an urgent laparoscopic cholecystectomy.  This was done for a  pre and postoperative diagnosis of acute appendicitis.  The patient was in  stable condition and sent initially to PACU and subsequently to the general  floor to recover.  The patient did well in the immediate postoperative  period.  She was having difficulty with urinary retention and required I and  O cath x1.  Post this she was only able to avoid an additional 100 cc with a  feeling of continued  bladder fullness and therefore a Foley catheter was  inserted.   The patient continued to slowly improve over the next few days.  She had  some nausea and constipation and still had some difficulty with headache.  She had a mild postoperative ileus.  Her diet was advanced slowly.  By  postoperative day #2 the patient's incisions were clean, dry and intact.  Her Foley catheter was discontinued and there was some consideration she  would be appropriate for discharge on postoperative day #3.   On postoperative day #3, the patient's vital signs were stable.  She was  afebrile.  She was on room air.  She had gone to the bathroom with bowel  movements x3.  She was having some loose stools but no abdominal pain.  She  was tolerating her diet without nausea or vomiting.  The patient had also  received Dulcolax and Milk of Magnesia 24 hours prior and this explained the  loose stools.  Her incisions were clean,  dry and intact.  The patient was  deemed appropriate for discharge home by Dr. Abbey Chatters.   FINAL DISCHARGE DIAGNOSIS:  Acute nonperforated appendicitis.   DISCHARGE MEDICATIONS:  1.  Please resume any preadmission medications.  2.  Vicodin 1-2 every 6 hours as needed for pain.   FOLLOWUP:  The patient needs to call Dr. Karle Starch to be seen in the next 2  weeks.  She is to call our office if she has any increasing abdominal pain,  redness or drainage from the surgical wounds or fever more than 101 degrees  by mouth.   She may shower, apply lotion.  No lifting for 2 weeks.  No driving for 2  weeks.  No lifting for 2-4 weeks.  Return to work and usual activities after  followup with Dr. Karle Starch.      Allison L. Rennis Harding, N.P.     ALE/MEDQ  D:  03/09/2006  T:  03/09/2006  Job:  [id number]

## 2011-01-17 NOTE — H&P (Signed)
NAME:  Kathryn [name], Kathryn [name] NO.:  [id number]   MEDICAL RECORD NO.:  [id number]          PATIENT TYPE:  EMS   LOCATION:  [facility]                         FACILITY:  [facility]   PHYSICIAN:  Velora Heckler, MD      DATE OF BIRTH:  26-Apr-1931   DATE OF ADMISSION:  02/12/2006  DATE OF DISCHARGE:                                HISTORY & PHYSICAL   CHIEF COMPLAINT:  Abdominal pain, anorexia   HISTORY OF PRESENT ILLNESS:  The patient is a 77 year old white female who  presents to the emergency department with a 24-hour history of abdominal  pain radiating to the right lower quadrant.  The patient has been anorectic.  She has had no emesis.  She has noted sweats and chills.  She complains of  headache.  The patient was evaluated in the emergency department.  She was  noted to have an elevated white blood cell count greater than 11,000.  Differential shows 85% segmented neutrophils.  CT scan abdomen and pelvis  was obtained which showed findings consistent with acute appendicitis.  General surgery is now consulted for management.   PAST MEDICAL HISTORY:  Status post open cholecystectomy by Dr. Jerelene Redden, status post sinus surgery by Dr. Zola Button T. Wolicki, history of anxiety  disorder, history of hypertension.   PRIMARY CARE PHYSICIAN:  Dr. Lajean Manes.   SOCIAL HISTORY:  The patient is widowed.  She has one son.  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  She denies tobacco use.  She denies alcohol use.   MEDICATIONS:  Prinizide, Klonopin, Ambien, Zyrtec, multivitamins.   ALLERGIES:  1.  CODEINE.  2.  PREDNISONE.   FAMILY HISTORY:  Noncontributory.   REVIEW OF SYSTEMS:  A 15-system review without significant other finding  except as noted above.   EXAM:  A 77 year old bright, alert white female on a stretcher in the  emergency department in mild discomfort.  Temperature 101.9, pulse 92,  respirations 20, blood pressure 123/67.  HEENT shows to be  normocephalic,  atraumatic.  Sclerae clear.  Conjunctiva clear.  Pupils equal and reactive.  Dentition fair.  Mucous membranes moist.  Voice normal.  The patient has  some soft tissue swelling and abrasion over the nasal bridge from a recent  fall and injury from her glasses.  Neck is supple, nontender without mass.  Thyroid normal without nodularity.  Lungs were clear to auscultation  bilaterally without rales, rhonchi or wheeze.  Cardiac exam shows regular  rate and rhythm without murmur.  Peripheral pulses are full.  Abdomen is  soft without distension.  Bowel sounds are present.  There is tenderness to  percussion and palpation, particularly in the right lower quadrant.  There  is voluntary guarding.  There is no palpable mass.  There is no rebound  tenderness.  Extremities are nontender without edema.  Neurologically, the  patient is alert and oriented without focal deficit.   LABORATORY STUDIES:  White count 11.1, differential showing 85% segmented  neutrophils, hemoglobin 13.4, platelet count 261,000.  Sodium slightly low  at 131.  Electrolytes otherwise normal.  Prothrombin time 13.0 with an INR  of 1.0.   RADIOGRAPHIC STUDIES:  CT scan abdomen and pelvis demonstrated findings  consistent with acute appendicitis.  Benign hemangiomas are noted in the  liver.  Cystic process in the adnexa and cervical region.   IMPRESSION:  Acute appendicitis.   PLAN:  1.  Admission to [facility].  2.  Initiation of intravenous antibiotics.  3.  To operating room for appendectomy.  4.  Routine postoperative care.   I discussed with [name] the indications for admission and urgent surgery.  I explained laparoscopic cholecystectomy versus the open technique.  I  quoted her approximately an 85-90% chance of success by laparoscopic  procedure versus 10-15% chance of conversion to open surgery.  We discussed  the hospital stay to be expected and her recovery.  She understands and   wishes to proceed.      Velora Heckler, MD  Electronically Signed     TMG/MEDQ  D:  02/13/2006  T:  02/13/2006  Job:  [id number]   cc:   Oley Balm. Georgina Pillion, M.D.  Fax: [phone]

## 2011-06-04 ENCOUNTER — Other Ambulatory Visit: Payer: Self-pay | Admitting: Dermatology

## 2011-06-12 LAB — HEMOGLOBIN AND HEMATOCRIT, BLOOD: Hemoglobin: 11.3 — ABNORMAL LOW

## 2011-06-13 LAB — BASIC METABOLIC PANEL
BUN: 10
CO2: 31
GFR calc non Af Amer: >60
Glucose, Bld: 101 — ABNORMAL HIGH
Potassium: 5
Sodium: 139

## 2011-10-17 ENCOUNTER — Emergency Department (HOSPITAL_COMMUNITY)
Admission: EM | Admit: 2011-10-17 | Discharge: 2011-10-17 | Disposition: A | Discharge status: Home / Self Care | Payer: Medicare Other | Attending: Emergency Medicine | Admitting: Emergency Medicine

## 2011-10-17 ENCOUNTER — Encounter (HOSPITAL_COMMUNITY): Payer: Self-pay | Admitting: Emergency Medicine

## 2011-10-17 ENCOUNTER — Emergency Department (HOSPITAL_COMMUNITY): Payer: Medicare Other

## 2011-10-17 DIAGNOSIS — E7800 Pure hypercholesterolemia, unspecified: Secondary | ICD-10-CM | POA: Insufficient documentation

## 2011-10-17 DIAGNOSIS — F329 Major depressive disorder, single episode, unspecified: Secondary | ICD-10-CM | POA: Insufficient documentation

## 2011-10-17 DIAGNOSIS — Z79899 Other long term (current) drug therapy: Secondary | ICD-10-CM | POA: Insufficient documentation

## 2011-10-17 DIAGNOSIS — I10 Essential (primary) hypertension: Secondary | ICD-10-CM | POA: Insufficient documentation

## 2011-10-17 DIAGNOSIS — F3289 Other specified depressive episodes: Secondary | ICD-10-CM | POA: Insufficient documentation

## 2011-10-17 DIAGNOSIS — Z7982 Long term (current) use of aspirin: Secondary | ICD-10-CM | POA: Insufficient documentation

## 2011-10-17 DIAGNOSIS — R109 Unspecified abdominal pain: Secondary | ICD-10-CM | POA: Insufficient documentation

## 2011-10-17 HISTORY — DX: Pure hypercholesterolemia, unspecified: E78.00

## 2011-10-17 HISTORY — DX: Major depressive disorder, single episode, unspecified: F32.9

## 2011-10-17 HISTORY — DX: Essential (primary) hypertension: I10

## 2011-10-17 HISTORY — DX: Depression, unspecified: F32.A

## 2011-10-17 LAB — CBC
HCT: 38.6 % (ref 36.0–46.0)
Hemoglobin: 13.4 g/dL (ref 12.0–15.0)
MCV: 88.9 fL (ref 78.0–100.0)
RBC: 4.34 MIL/uL (ref 3.87–5.11)
WBC: 5.4 10*3/uL (ref 4.0–10.5)

## 2011-10-17 LAB — URINALYSIS, ROUTINE W REFLEX MICROSCOPIC
Glucose, UA: NEGATIVE mg/dL
Hgb urine dipstick: NEGATIVE
Leukocytes, UA: NEGATIVE
pH: 6.5 (ref 5.0–8.0)

## 2011-10-17 LAB — COMPREHENSIVE METABOLIC PANEL
AST: 23 U/L (ref 0–37)
BUN: 11 mg/dL (ref 6–23)
CO2: 28 mEq/L (ref 19–32)
Chloride: 90 mEq/L — ABNORMAL LOW (ref 96–112)
Creatinine, Ser: 0.73 mg/dL (ref 0.50–1.10)
GFR calc Af Amer: >90 mL/min (ref >90)
GFR calc non Af Amer: 79 mL/min — ABNORMAL LOW (ref >90)
Glucose, Bld: 88 mg/dL (ref 70–99)
Total Bilirubin: 0.9 mg/dL (ref 0.3–1.2)

## 2011-10-17 LAB — DIFFERENTIAL
Basophils Absolute: 0 10*3/uL (ref 0.0–0.1)
Eosinophils Relative: 1 % (ref 0–5)
Lymphocytes Relative: 26 % (ref 12–46)
Lymphs Abs: 1.4 10*3/uL (ref 0.7–4.0)
Monocytes Absolute: 0.6 10*3/uL (ref 0.1–1.0)
Monocytes Relative: 11 % (ref 3–12)
Neutro Abs: 3.3 10*3/uL (ref 1.7–7.7)

## 2011-10-17 MED ORDER — TRAMADOL HCL 50 MG PO TABS: 50.0000 mg | ORAL_TABLET | Freq: Three times a day (TID) | ORAL | Status: AC | PRN

## 2011-10-17 NOTE — ED Provider Notes (Signed)
History     CSN: [id number]  Arrival date & time 10/17/11  1451   First MD Initiated Contact with Patient 10/17/11 1702      Chief Complaint  Patient presents with  . Abdominal Pain    (Consider location/radiation/quality/duration/timing/severity/associated sxs/prior treatment) Patient is a 78 y.o. female presenting with abdominal pain. The history is provided by the patient (Patient complains of chronic lower abdominal pain for a number of weeks. She seen a doctor numerous times.). No language interpreter was used.  Abdominal Pain The primary symptoms of the illness include abdominal pain. The primary symptoms of the illness do not include fatigue or diarrhea. The current episode started more than 2 days ago. The onset of the illness was gradual. The problem has not changed since onset. The abdominal pain began more than 2 days ago. The pain came on gradually. The abdominal pain has been unchanged since its onset. The abdominal pain is generalized. The abdominal pain does not radiate. The severity of the abdominal pain is 2/10. The abdominal pain is relieved by nothing.  The patient states that she believes she is currently not pregnant. The patient has not had a change in bowel habit. Symptoms associated with the illness do not include hematuria, frequency or back pain.    Past Medical History  Diagnosis Date  . Renal disorder   . Depression   . Hypertension   . High cholesterol     Past Surgical History  Procedure Date  . Abdominal hysterectomy   . Colon surgery   . Cholecystectomy   . Appendectomy     No family history on file.  History  Substance Use Topics  . Smoking status: Never Smoker   . Smokeless tobacco: Not on file  . Alcohol Use: No    OB History    Grav Para Term Preterm Abortions TAB SAB Ect Mult Living                  Review of Systems  Constitutional: Negative for fatigue.  HENT: Negative for congestion, sinus pressure and ear discharge.     Eyes: Negative for discharge.  Respiratory: Negative for cough.   Cardiovascular: Negative for chest pain.  Gastrointestinal: Positive for abdominal pain. Negative for diarrhea.  Genitourinary: Negative for frequency and hematuria.  Musculoskeletal: Negative for back pain.  Skin: Negative for rash.  Neurological: Negative for seizures and headaches.  Hematological: Negative.   Psychiatric/Behavioral: Negative for hallucinations.    Allergies  Codeine; Septra; Citalopram; Ibuprofen; Lisinopril; Vicodin; and Miconazole  Home Medications   Current Outpatient Rx  Name Route Sig Dispense Refill  . ASPIRIN EC 81 MG PO TBEC Oral Take 81 mg by mouth daily.    Marland Kitchen CLONAZEPAM 0.5 MG PO TABS Oral Take 0.5 mg by mouth at bedtime as needed. anxiety    . CYANOCOBALAMIN 100 MCG PO TABS Oral Take 100 mcg by mouth daily.    Marland Kitchen ESTRADIOL 0.0375 MG/24HR TD PTTW Transdermal Place 1 patch onto the skin 2 (two) times a week.    Marland Kitchen PANTOPRAZOLE SODIUM 40 MG PO TBEC Oral Take 40 mg by mouth daily.    Marland Kitchen SIMVASTATIN 40 MG PO TABS Oral Take 40 mg by mouth every evening.    . TERCONAZOLE 0.4 % VA CREA Vaginal Place 1 applicator vaginally at bedtime.    Marland Kitchen VALSARTAN-HYDROCHLOROTHIAZIDE 160-12.5 MG PO TABS Oral Take 1 tablet by mouth daily.    Marland Kitchen ZOLPIDEM TARTRATE ER 12.5 MG PO TBCR Oral Take 12.5  mg by mouth at bedtime.    . TRAMADOL HCL 50 MG PO TABS Oral Take 1 tablet (50 mg total) by mouth every 8 (eight) hours as needed for pain. 20 tablet 0    BP 150/81  Pulse 77  Temp 98.7 F (37.1 C)  Resp 16  SpO2 98%  Physical Exam  Constitutional: She is oriented to person, place, and time. She appears well-developed.  HENT:  Head: Normocephalic and atraumatic.  Eyes: Conjunctivae and EOM are normal. No scleral icterus.  Neck: Neck supple. No thyromegaly present.  Cardiovascular: Normal rate and regular rhythm.  Exam reveals no gallop and no friction rub.   No murmur heard. Pulmonary/Chest: No stridor. She has  no wheezes. She has no rales. She exhibits no tenderness.  Abdominal: She exhibits no distension. There is tenderness. There is no rebound.       Mild suprapubic tendernous  Musculoskeletal: Normal range of motion. She exhibits no edema.  Lymphadenopathy:    She has no cervical adenopathy.  Neurological: She is oriented to person, place, and time. Coordination normal.  Skin: No rash noted. No erythema.  Psychiatric: She has a normal mood and affect. Her behavior is normal.    ED Course  Procedures (including critical care time)  Labs Reviewed  COMPREHENSIVE METABOLIC PANEL - Abnormal; Notable for the following:    Sodium 130 (*)    Potassium 3.4 (*)    Chloride 90 (*)    Calcium 10.9 (*)    GFR calc non Af Amer 79 (*)    All other components within normal limits  URINALYSIS, ROUTINE W REFLEX MICROSCOPIC  CBC  DIFFERENTIAL   Dg Abd Acute W/chest  10/17/2011  *RADIOLOGY REPORT*  Clinical Data: Abdominal pain for 2 months.  ACUTE ABDOMEN SERIES (ABDOMEN 2 VIEW & CHEST 1 VIEW)  Comparison: CT of 02/12/2006 and chest film of 05/12/2007.  Findings: Frontal view of the chest demonstrates midline trachea. Normal heart size and mediastinal contours for age.  Probable pleural thickening at the right lung base blunts the costophrenic angle. No pneumothorax.  There is mild bibasilar scarring.  Abominal films demonstrate mild convex right lumbar spine curvature. No free intraperitoneal air.  No significant air fluid levels on upright positioning.  No small bowel dilatation.  Distal gas identified.  Cholecystectomy clips. No abnormal abdominal calcifications.   No appendicolith.  IMPRESSION: No acute findings.  Original Report Authenticated By: Consuello Bossier, M.D.     1. Abdominal pain       MDM  Abdominal pain unknown etiology possibly scar tissue from previous surgery. Patient will followup with her and        Benny Lennert, MD 10/17/11 [phone]

## 2011-10-17 NOTE — ED Notes (Signed)
Lower abd pain  For days has been being tx for yeast infection since  Aug

## 2011-10-17 NOTE — ED Notes (Signed)
Patient states onset one day ago LLQ and RLQ pain worsening over time. States watching TV with pressure of hands resting on stomach make pain increase. Patient states has abdominal fullness soft non distended.  Denies n/v/d.  Airway intact bilateral equal chest rise and fall.  Patient concerned also of yeast infection vaginal area. Seen Doctor in the past applied medication with slight relief.

## 2011-12-22 ENCOUNTER — Ambulatory Visit (INDEPENDENT_AMBULATORY_CARE_PROVIDER_SITE_OTHER): Payer: Medicare Other | Admitting: Nurse Practitioner

## 2011-12-22 ENCOUNTER — Other Ambulatory Visit (INDEPENDENT_AMBULATORY_CARE_PROVIDER_SITE_OTHER): Payer: Medicare Other

## 2011-12-22 ENCOUNTER — Telehealth: Payer: Self-pay | Admitting: Internal Medicine

## 2011-12-22 ENCOUNTER — Encounter: Payer: Self-pay | Admitting: Nurse Practitioner

## 2011-12-22 VITALS — BP 142/82 | HR 76 | Ht 66.0 in | Wt 144.6 lb

## 2011-12-22 DIAGNOSIS — R198 Other specified symptoms and signs involving the digestive system and abdomen: Secondary | ICD-10-CM

## 2011-12-22 DIAGNOSIS — K649 Unspecified hemorrhoids: Secondary | ICD-10-CM

## 2011-12-22 DIAGNOSIS — R194 Change in bowel habit: Secondary | ICD-10-CM

## 2011-12-22 DIAGNOSIS — R140 Abdominal distension (gaseous): Secondary | ICD-10-CM

## 2011-12-22 DIAGNOSIS — Z860100 Personal history of colon polyps, unspecified: Secondary | ICD-10-CM

## 2011-12-22 DIAGNOSIS — R1030 Lower abdominal pain, unspecified: Secondary | ICD-10-CM

## 2011-12-22 DIAGNOSIS — R109 Unspecified abdominal pain: Secondary | ICD-10-CM

## 2011-12-22 DIAGNOSIS — R143 Flatulence: Secondary | ICD-10-CM

## 2011-12-22 LAB — BASIC METABOLIC PANEL WITH GFR
BUN: 11 mg/dL (ref 6–23)
CO2: 29 meq/L (ref 19–32)
Calcium: 9.9 mg/dL (ref 8.4–10.5)
Chloride: 95 meq/L — ABNORMAL LOW (ref 96–112)
Creatinine, Ser: 0.9 mg/dL (ref 0.4–1.2)
GFR: 67.61 mL/min
Glucose, Bld: 91 mg/dL (ref 70–99)
Potassium: 4.2 meq/L (ref 3.5–5.1)
Sodium: 133 meq/L — ABNORMAL LOW (ref 135–145)

## 2011-12-22 LAB — CBC WITH DIFFERENTIAL/PLATELET
Basophils Absolute: 0 K/uL (ref 0.0–0.1)
Basophils Relative: 0.3 % (ref 0.0–3.0)
Eosinophils Absolute: 0.1 K/uL (ref 0.0–0.7)
Eosinophils Relative: 1 % (ref 0.0–5.0)
HCT: 38.8 % (ref 36.0–46.0)
Hemoglobin: 13.3 g/dL (ref 12.0–15.0)
Lymphocytes Relative: 18.5 % (ref 12.0–46.0)
Lymphs Abs: 1 K/uL (ref 0.7–4.0)
MCHC: 34.3 g/dL (ref 30.0–36.0)
MCV: 92.5 fl (ref 78.0–100.0)
Monocytes Absolute: 0.8 K/uL (ref 0.1–1.0)
Monocytes Relative: 13.7 % — ABNORMAL HIGH (ref 3.0–12.0)
Neutro Abs: 3.8 K/uL (ref 1.4–7.7)
Neutrophils Relative %: 66.5 % (ref 43.0–77.0)
Platelets: 197 K/uL (ref 150.0–400.0)
RBC: 4.2 Mil/uL (ref 3.87–5.11)
RDW: 12.3 % (ref 11.5–14.6)
WBC: 5.7 K/uL (ref 4.5–10.5)

## 2011-12-22 MED ORDER — DICYCLOMINE HCL 10 MG PO CAPS: ORAL_CAPSULE | ORAL | Status: DC

## 2011-12-22 MED ORDER — HYDROCORTISONE ACETATE 25 MG RE SUPP: RECTAL | Status: DC

## 2011-12-22 MED ORDER — RESTORA PO CAPS: 1.0000 | ORAL_CAPSULE | Freq: Every day | ORAL | Status: DC

## 2011-12-22 NOTE — Telephone Encounter (Signed)
Patient states she has had diarrhea and lower abdominal pain since Friday. States she is having diarrhea 4/day. She is only drinking liquids because it makes her stomach hurt more when she eats solids. Abdominal pain is below her belly button and is all across the abdomen. She is having nausea but denies vomiting. States she does not know if she has a temperature but she is having chills and is weak. States she saw her neurologist last week and he told her to call her GI MD. Scheduled patient with Willette Cluster, NP today at 10:30 AM.

## 2011-12-22 NOTE — Patient Instructions (Addendum)
Please go to the basement level to have your labs drawn.  We made you a follow up appointment with Dr Juanda Chance for 01-23-2012 at 1:45 PM.    We sent prescriptions to [facility] for Suppositories and Bentyl ( Dicyclomine).  We have given you samples of a probiotic Restora. Take 1 capsule daily until finished. ( 11 days).

## 2011-12-24 ENCOUNTER — Other Ambulatory Visit: Payer: Medicare Other

## 2011-12-24 ENCOUNTER — Encounter: Payer: Self-pay | Admitting: Nurse Practitioner

## 2011-12-24 DIAGNOSIS — R1030 Lower abdominal pain, unspecified: Secondary | ICD-10-CM

## 2011-12-24 DIAGNOSIS — R140 Abdominal distension (gaseous): Secondary | ICD-10-CM

## 2011-12-24 DIAGNOSIS — R194 Change in bowel habit: Secondary | ICD-10-CM

## 2011-12-24 NOTE — Progress Notes (Signed)
12/24/2011 Kathryn [name] [id number] 07-Feb-1931   HISTORY OF PRESENT ILLNESS: Patient is a 78 year old female seen in the past by Dr. Juanda Chance for history of rectal prolapse and a history of adenomatous colon polyps. She has not been seen here in at least three years. Patient presents with multiple generalized medical complaints. She complains of abdominal pain and increased frequency of stools. She was evaluated by the ED in mid February for this same lower abdominal pain. Abdominal exam at that time remarkable only for mild suprapubic tenderness. Her urinalysis was unremarkable, CBC was unremarkable, and her acute abdominal series was negative. Her abdominal pain was felt to be secondary to adhesions, she was discharged home from the emergency department. Kathryn [name] has continued to have diffuse lower abdominal discomfort. When the patient called for an appointment it was documented that her pain was worse with eating but patient tells me that her pain is not related to meals. In addition to the abdominal pain patient has had a change in her bowel habits. Stools are soft but have become more frequent. Her baseline is 2-3 bowel movements a day, she's having 4-5 soft stools a day now, most of which are postprandial. Patient was evaluated by her PCP in early March. Her thyroid-stimulating hormone was normal at 2.09. Serum sodium low at 131, potassium normal at 4.2. BUN and creatinine were normal. She was given a one week trial of Restora which greatly improved her symptoms. Patient is interested in another course of Restora.    Patient complains of occasional hemorrhoidal bleeding.   Past Medical History  Diagnosis Date  . Renal disorder   . Depression   . Hypertension   . High cholesterol   . Stroke   . Migraine    Past Surgical History  Procedure Date  . Abdominal hysterectomy   . Colon surgery   . Cholecystectomy   . Appendectomy     reports that she has never smoked. She has never used  smokeless tobacco. She reports that she does not drink alcohol or use illicit drugs. family history includes Hypertension in her maternal aunt and Stroke in her father. Allergies  Allergen Reactions  . Codeine Anaphylaxis  . Septra (Bactrim) Anaphylaxis  . Citalopram Swelling  . Ibuprofen   . Lisinopril Cough  . Vicodin (Hydrocodone-Acetaminophen) Other (See Comments)    stroke  . Miconazole Rash      Outpatient Encounter Prescriptions as of 12/22/2011  Medication Sig Dispense Refill  . aspirin EC 81 MG tablet Take 81 mg by mouth daily.      . Calcium Carb-Cholecalciferol (CALCIUM 500/D) 500-400 MG-UNIT CHEW Chew 300 mg by mouth 2 (two) times daily.      . clonazePAM (KLONOPIN) 0.5 MG tablet Take 0.5 mg by mouth at bedtime as needed. anxiety      . cyanocobalamin 100 MCG tablet Take 100 mcg by mouth daily.      . diazepam (VALIUM) 2 MG tablet Take 2 mg by mouth every 6 (six) hours as needed.      Marland Kitchen estradiol (VIVELLE-DOT) 0.0375 MG/24HR Place 1 patch onto the skin 2 (two) times a week.      . fluticasone (FLONASE) 50 MCG/ACT nasal spray Place 2 sprays into the nose as needed.      . hydrOXYzine (ATARAX/VISTARIL) 25 MG tablet Take 25 mg by mouth 3 (three) times daily as needed.      . Multiple Vitamin (MULTIVITAMIN) capsule Take 1 capsule by mouth daily.      Marland Kitchen  pantoprazole (PROTONIX) 40 MG tablet Take 40 mg by mouth daily.      . simvastatin (ZOCOR) 40 MG tablet Take 40 mg by mouth every evening.      Marland Kitchen terconazole (TERAZOL 7) 0.4 % vaginal cream Place 1 applicator vaginally at bedtime.      . valsartan-hydrochlorothiazide (DIOVAN-HCT) 160-12.5 MG per tablet Take 1 tablet by mouth daily.      Marland Kitchen zolpidem (AMBIEN CR) 12.5 MG CR tablet Take 12.5 mg by mouth at bedtime.      . dicyclomine (BENTYL) 10 MG capsule Take 1 tab twice daily for cramping and abdominal pain  60 capsule  1  . hydrocortisone (ANUSOL-HC) 25 MG suppository Use 1 supp rectally at bedtime for 10 nights.  10 suppository   1  . Probiotic Product (RESTORA) CAPS Take 1 capsule by mouth daily.  11 capsule  0     REVIEW OF SYSTEMS  : Positive for allergies, anxiety, arthritis, back pain, breast changes, vision changes, confusion, depression, headaches, itching, skin rash, sleeping problems, swelling of her feet and legs, excessive urination, All other systems reviewed and negative except where noted in the History of Present Illness.   PHYSICAL EXAM: BP 142/82  Pulse 76  Ht 5\' 6"  (1.676 m)  Wt 144 lb 9.6 oz (65.59 kg)  BMI 23.34 kg/m2 General: Well developed white female in no acute distress Head: Normocephalic and atraumatic Eyes:  sclerae anicteric,conjunctive pink. Ears: Normal auditory acuity Mouth: No deformity or lesions Neck: Supple, no masses.  Lungs: Clear throughout to auscultation Heart: Regular rate and rhythm; no murmurs heard Abdomen: Soft, non distended, nontender. No masses or hepatomegaly noted. Normal Bowel sounds Rectal: Mildly inflamed internal hemorrhoids on anoscopy Musculoskeletal: Symmetrical with no gross deformities  Skin: No lesions on visible extremities Extremities: No edema or deformities noted Neurological: Alert oriented, grossly nonfocal Cervical Nodes:  No significant cervical adenopathy Psychological:  Alert and cooperative. Normal mood and affect  ASSESSMENT AND PLAN; 1. several week history of diffuse lower abdominal discomfort associated with a slight increase in frequency of stools. Etiology not clear, possibly IBS. Not likely to be infectious but certainly Clostridium difficile should be excluded so will check stool studies. Will check some basic labs as well. For the abdominal discomfort will try twice daily dicyclomine. Patient was recently treated with a one-week course of Restora and her symptoms did improve. Will retreat her with another week of Restora.  Patient will return for reevaluation in 3-4 weeks. In the meantime she will be called with test results and  any further recommendations based on those results.  2. Multiple medical complaints, will defer to PCP.  3. History of adenomatous polyps. Her last surveillance colonoscopy was January 2009. She is due for surveillance colonoscopy January 2014.   4. Internal hemorrhoids with intermittent low-volume bleeding. Dr. Daphine Deutscher did surgery for prolapsing hemorrhoids  in January 2010. Will try Anusol-HC suppositories. If no improvement, she may need banding at some point.

## 2011-12-25 NOTE — Progress Notes (Signed)
Agree with initial assessment and plans 

## 2011-12-26 LAB — CLOSTRIDIUM DIFFICILE BY PCR: Toxigenic C. Difficile by PCR: NOT DETECTED

## 2012-01-09 ENCOUNTER — Encounter: Payer: Self-pay | Admitting: *Deleted

## 2012-01-23 ENCOUNTER — Ambulatory Visit (INDEPENDENT_AMBULATORY_CARE_PROVIDER_SITE_OTHER): Payer: Medicare Other | Admitting: Internal Medicine

## 2012-01-23 ENCOUNTER — Encounter: Payer: Self-pay | Admitting: Internal Medicine

## 2012-01-23 VITALS — BP 122/60 | HR 75 | Ht 66.0 in | Wt 144.0 lb

## 2012-01-23 DIAGNOSIS — R197 Diarrhea, unspecified: Secondary | ICD-10-CM

## 2012-01-23 DIAGNOSIS — K589 Irritable bowel syndrome without diarrhea: Secondary | ICD-10-CM

## 2012-01-23 MED ORDER — HYOSCYAMINE SULFATE 0.125 MG SL SUBL: SUBLINGUAL_TABLET | SUBLINGUAL | Status: DC

## 2012-01-23 MED ORDER — CLONAZEPAM 1 MG PO TABS: ORAL_TABLET | ORAL | Status: DC

## 2012-01-23 NOTE — Patient Instructions (Signed)
We have sent the following medications to your pharmacy for you to pick up at your convenience: Klonopin 1 mg- Take 1/2 tablet every morning and 1 tablet every night. Levsin SL (in place of Bentyl)- 1 tablet under the tongue up to 3 times daily as needed for colon spasm. CC: Dr Mila Palmer

## 2012-01-23 NOTE — Progress Notes (Signed)
Kathryn [name] May 24, 1931 MRN [id number]   History of Present Illness:  This is a 78 year old white female with diarrhea predominant irritable bowel syndrome. She saw Willette Cluster  on 12/24/2011 for generalized abdominal pain and increased stool frequency and was started on Bentyl 10 mg 3 times a day which has helped  but has caused a dry mouth, blurred vision and fatigue. She had a colonoscopy in January 2009 with findings of internal hemorrhoids. She subsequently underwent a hemorrhoidectomy by Dr. Daphine Deutscher. She feels that she still has problems with her rectum. She was recently seen in the emergency room in February 2013 for generalized abdominal pain and high blood pressure. She has been very depressed because of the death of her female friend from pancreatic cancer. She has been teary-eyed. She feels very anxious and she cannot sleep at night. She currently takes Klonopin 0.5 mg at bedtime but feels that she needs more. Diarrhea has improved on dicyclomine and Imodium. She also takes a probiotic.   Past Medical History  Diagnosis Date  . Renal disorder   . Depression   . Hypertension   . High cholesterol   . Stroke   . Migraine   . Rectal prolapse   . Hx of adenomatous colonic polyps   . Internal hemorrhoids   . Hypothyroidism   . Fecal incontinence   . Sleep apnea   . TIA (transient ischemic attack)    Past Surgical History  Procedure Date  . Abdominal hysterectomy   . Colon surgery   . Cholecystectomy   . Appendectomy     reports that she has never smoked. She has never used smokeless tobacco. She reports that she does not drink alcohol or use illicit drugs. family history includes Hypertension in her maternal aunt; Migraines in her father; and Stroke in her father.  There is no history of Colon cancer. Allergies  Allergen Reactions  . Codeine Anaphylaxis  . Septra (Bactrim) Anaphylaxis  . Citalopram Swelling  . Ibuprofen   . Lisinopril Cough  . Vicodin  (Hydrocodone-Acetaminophen) Other (See Comments)    stroke  . Miconazole Rash        Review of Systems: Negative for dysphagia odynophagia or chest pain  The remainder of the 10 point ROS is negative except as outlined in H&P   Physical Exam: General appearance  Well developed, in no distress anxious appearing. Eyes- non icteric. HEENT nontraumatic, normocephalic. Mouth no lesions, tongue papillated, no cheilosis. Neck supple without adenopathy, thyroid not enlarged, no carotid bruits, no JVD. Lungs Clear to auscultation bilaterally. Cor normal S1, normal S2, regular rhythm, no murmur,  quiet precordium. Abdomen: Diffuse abdominal tenderness in all quadrants. Normal active bowel sounds. Soft abdomen without distention. Postcholecystectomy scars. Rectal: Not repeated Extremities no pedal edema. Skin no lesions. Neurological alert and oriented x 3. Psychological normal mood and affect.  Assessment and Plan:  Problem #1 Irritable bowel syndrome with predominant diarrhea. Patient has generalized anxiety syndrome and reactive depression from the death of her female friend. We will increase her Klonopin to 1 mg at bedtime with an additional 0.5 mg in the morning. She will also be switched from dicyclomine to Levsin sublingual 0.125 mg  when necessary before meals. I encouraged her to take Imodium as needed for diarrhea.  Pproblem #2- anxiety - see above comment   01/23/2012 Kathryn [name]

## 2012-05-03 ENCOUNTER — Encounter (HOSPITAL_COMMUNITY): Payer: Self-pay

## 2012-05-03 ENCOUNTER — Emergency Department (HOSPITAL_COMMUNITY): Payer: Medicare Other

## 2012-05-03 ENCOUNTER — Emergency Department (HOSPITAL_COMMUNITY)
Admission: EM | Admit: 2012-05-03 | Discharge: 2012-05-03 | Disposition: A | Discharge status: Home / Self Care | Payer: Medicare Other | Attending: Emergency Medicine | Admitting: Emergency Medicine

## 2012-05-03 DIAGNOSIS — E7800 Pure hypercholesterolemia, unspecified: Secondary | ICD-10-CM | POA: Insufficient documentation

## 2012-05-03 DIAGNOSIS — R51 Headache: Secondary | ICD-10-CM | POA: Insufficient documentation

## 2012-05-03 DIAGNOSIS — Z79899 Other long term (current) drug therapy: Secondary | ICD-10-CM | POA: Insufficient documentation

## 2012-05-03 DIAGNOSIS — I10 Essential (primary) hypertension: Secondary | ICD-10-CM | POA: Insufficient documentation

## 2012-05-03 DIAGNOSIS — E039 Hypothyroidism, unspecified: Secondary | ICD-10-CM | POA: Insufficient documentation

## 2012-05-03 DIAGNOSIS — Z8673 Personal history of transient ischemic attack (TIA), and cerebral infarction without residual deficits: Secondary | ICD-10-CM | POA: Insufficient documentation

## 2012-05-03 LAB — CBC WITH DIFFERENTIAL/PLATELET
Basophils Absolute: 0 10*3/uL (ref 0.0–0.1)
Eosinophils Absolute: 0.1 10*3/uL (ref 0.0–0.7)
Eosinophils Relative: 3 % (ref 0–5)
MCH: 31.4 pg (ref 26.0–34.0)
MCHC: 35.4 g/dL (ref 30.0–36.0)
MCV: 88.6 fL (ref 78.0–100.0)
Monocytes Absolute: 0.6 10*3/uL (ref 0.1–1.0)
Platelets: 199 10*3/uL (ref 150–400)
RDW: 12.6 % (ref 11.5–15.5)

## 2012-05-03 LAB — BASIC METABOLIC PANEL
Calcium: 10.5 mg/dL (ref 8.4–10.5)
Creatinine, Ser: 0.79 mg/dL (ref 0.50–1.10)
GFR calc non Af Amer: 77 mL/min — ABNORMAL LOW (ref >90)
Sodium: 125 mEq/L — ABNORMAL LOW (ref 135–145)

## 2012-05-03 NOTE — ED Provider Notes (Signed)
History     CSN: [id number]  Arrival date & time 05/03/12  2125   First MD Initiated Contact with Patient 05/03/12 2125      Chief Complaint  Patient presents with  . Headache    (Consider location/radiation/quality/duration/timing/severity/associated sxs/prior treatment) HPI Comments: Patient is a 78 yo female with PMH as listed below and relevant for prior CVA, HTN, and migraine HAs who presents to the ED with complaints of HA and HTN.  Patient reports that she has had intermittent HAs for years.  However, over the last few weeks HAs have been more frequent and more severe.  Yesterday she had another onset of HA c/w prior migraine HAs although more severe.  Location is "top of head", sharp, and mild in severity currently,  She reports HA improved after taking propanolol prior to arriving in the ED.  However, at home her BPs have been 160-190s/70-80s and thus she decided to come to the ED.  She denies any fevers, SOB, CP, blurry vision, or other symptoms.  When asked about recent falls, patent states that she struck head on a tree 3 weeks ago but she is unsure if event is correlated with worsening HAs.    Patient is a 78 y.o. female presenting with hypertension. The history is provided by the patient. No language interpreter was used.  Hypertension This is a chronic problem. The current episode started more than 1 year ago. The problem occurs constantly. The problem has been gradually worsening. Associated symptoms include headaches. Nothing aggravates the symptoms. Treatments tried: prescribed medications. The treatment provided significant relief.    Past Medical History  Diagnosis Date  . Renal disorder   . Depression   . Hypertension   . High cholesterol   . Stroke   . Migraine   . Rectal prolapse   . Hx of adenomatous colonic polyps   . Internal hemorrhoids   . Hypothyroidism   . Fecal incontinence   . Sleep apnea   . TIA (transient ischemic attack)     Past Surgical  History  Procedure Date  . Abdominal hysterectomy   . Colon surgery   . Cholecystectomy   . Appendectomy     Family History  Problem Relation Age of Onset  . Stroke Father   . Hypertension Maternal Aunt     x3  . Colon cancer Neg Hx   . Migraines Father     History  Substance Use Topics  . Smoking status: Never Smoker   . Smokeless tobacco: Never Used  . Alcohol Use: No    OB History    Grav Para Term Preterm Abortions TAB SAB Ect Mult Living                  Review of Systems  Neurological: Positive for headaches.  All other systems reviewed and are negative.    Allergies  Codeine; Septra; Citalopram; Ibuprofen; Lisinopril; Vicodin; and Miconazole  Home Medications   Current Outpatient Rx  Name Route Sig Dispense Refill  . ASPIRIN EC 81 MG PO TBEC Oral Take 81 mg by mouth daily.    Marland Kitchen CALCIUM CARB-CHOLECALCIFEROL 500-400 MG-UNIT PO CHEW Oral Chew 300 mg by mouth 2 (two) times daily.    Marland Kitchen CLONAZEPAM 1 MG PO TABS  Take 1/2 tablet by mouth every morning and 1 tablet by mouth every night. 135 tablet 0  . CYANOCOBALAMIN 100 MCG PO TABS Oral Take 100 mcg by mouth daily.    Marland Kitchen DIAZEPAM 2 MG PO  TABS Oral Take 2 mg by mouth every 6 (six) hours as needed.    Marland Kitchen ESTRADIOL 0.0375 MG/24HR TD PTTW Transdermal Place 1 patch onto the skin 2 (two) times a week.    Marland Kitchen FLUTICASONE PROPIONATE 50 MCG/ACT NA SUSP Nasal Place 2 sprays into the nose as needed.    Marland Kitchen HYDROCORTISONE ACETATE 25 MG RE SUPP  Use 1 supp rectally at bedtime for 10 nights. 10 suppository 1  . HYDROXYZINE HCL 25 MG PO TABS Oral Take 25 mg by mouth 3 (three) times daily as needed.    Marland Kitchen HYOSCYAMINE SULFATE 0.125 MG SL SUBL  Dissolve 1 tablet under the tongue up to three times daily as needed for colon spasm. 270 tablet 0    Pharmacy-please d/c order for bentyl  . MULTIVITAMINS PO CAPS Oral Take 1 capsule by mouth daily.    Marland Kitchen PANTOPRAZOLE SODIUM 40 MG PO TBEC Oral Take 40 mg by mouth daily.    Marland Kitchen ALIGN 4 MG PO CAPS  Oral Take 1 tablet by mouth daily.    Marland Kitchen SIMVASTATIN 40 MG PO TABS Oral Take 40 mg by mouth every evening.    . TERCONAZOLE 0.4 % VA CREA Vaginal Place 1 applicator vaginally at bedtime.    Marland Kitchen VALSARTAN-HYDROCHLOROTHIAZIDE 160-12.5 MG PO TABS Oral Take 1 tablet by mouth daily.    Marland Kitchen ZOLPIDEM TARTRATE ER 12.5 MG PO TBCR Oral Take 12.5 mg by mouth at bedtime.      BP 147/62  Pulse 58  Temp 97.7 F (36.5 C) (Oral)  Resp 16  SpO2 100%  Physical Exam  Nursing note and vitals reviewed. Constitutional: She appears well-developed and well-nourished.  HENT:  Head: Normocephalic and atraumatic.  Right Ear: External ear normal.  Left Ear: External ear normal.  Nose: Nose normal.  Mouth/Throat: Oropharynx is clear and moist. No oropharyngeal exudate.  Eyes: Conjunctivae and EOM are normal. Pupils are equal, round, and reactive to light.  Neck: Normal range of motion. Neck supple. No JVD present. No tracheal deviation present. No thyromegaly present.  Cardiovascular: Normal rate, regular rhythm, normal heart sounds and intact distal pulses.   Pulmonary/Chest: Effort normal and breath sounds normal. No stridor.  Abdominal: Soft. Bowel sounds are normal.  Musculoskeletal: Normal range of motion. She exhibits no edema and no tenderness.  Lymphadenopathy:    She has no cervical adenopathy.  Neurological: She is alert. She has normal strength and normal reflexes. No cranial nerve deficit or sensory deficit. Coordination and gait normal. GCS eye subscore is 4. GCS verbal subscore is 5. GCS motor subscore is 6.  Skin: Skin is warm and dry.  Psychiatric: She has a normal mood and affect.    ED Course  Procedures (including critical care time)  Labs Reviewed  CBC WITH DIFFERENTIAL - Abnormal; Notable for the following:    HCT 35.9 (*)     All other components within normal limits  BASIC METABOLIC PANEL - Abnormal; Notable for the following:    Sodium 125 (*)     Potassium 3.4 (*)     Chloride 87  (*)     Glucose, Bld 142 (*)     GFR calc non Af Amer 77 (*)     GFR calc Af Amer 89 (*)     All other components within normal limits   Ct Head Wo Contrast  05/03/2012  *RADIOLOGY REPORT*  Clinical Data: Chronic migraine headache worse over the last few weeks.  Elevated blood pressure.  CT HEAD  WITHOUT CONTRAST  Technique:  Contiguous axial images were obtained from the base of the skull through the vertex without contrast.  Comparison: MRI brain 11/18/2010.  Head CT 07/13/2010.  Findings: There is no evidence of acute intracranial hemorrhage, mass lesion, brain edema or extra-axial fluid collection.  The ventricles and subarachnoid spaces are appropriately sized for age. There is no CT evidence of acute cortical infarction.  Old infarcts in the basal ganglia bilaterally are unchanged.  The visualized paranasal sinuses are clear. The calvarium is intact. There are dense intracranial vascular calcifications.  IMPRESSION: Stable old bilateral basal ganglial infarcts.  No acute intracranial findings.   Original Report Authenticated By: Gerrianne Scale, M.D.      1. Headache   2. High blood pressure       MDM   Patient is a 78 y.o. female who presents with complaints of worsening HAs and HTN.  Upon arrival, AF and VS remarkable for initial BP of 147/62.  On exam, patient with no focal neuro deficits, no signs of focal infection, and otherwise unremarkable.  Due to patient's age, worsening HAs, reported systolic BPs in the 190s it was felt that CT of head was warranted.  Review of results showed no acute changes.  With HA resolved, BP not needing acute intervention, and no signs of end organ damage it was felt that patient was safe for discharge home.  Patient is to follow up with PCP as soon as possible for better BP regulation and follow up of headaches.          Johnney Ou, MD 05/04/12 [phone]

## 2012-05-03 NOTE — ED Notes (Signed)
Pt has been having migraine HA for 2 years, past few weeks has been worse.  BP elevated last few weeks 180's.  Pt also having abd spasms that she takes bentyl for.  Pt has maxed out on BP meds.

## 2012-05-03 NOTE — ED Notes (Signed)
MD at bedside. 

## 2012-05-03 NOTE — ED Notes (Signed)
Patient transported to CT 

## 2012-05-04 NOTE — ED Provider Notes (Signed)
I saw and evaluated the patient, reviewed the resident's note and I agree with the findings and plan.  Likely migraine headache for the pt. Non focal neuro exam. No recent head trauma. No fever. Doubt meningitis. Head CT obtained given hx of HTN at home. Improved with treatment in ER. Close pcp follow up      Lyanne Co, MD 05/04/12 [phone]

## 2012-09-08 ENCOUNTER — Encounter: Payer: Self-pay | Admitting: Internal Medicine

## 2012-11-26 ENCOUNTER — Other Ambulatory Visit: Payer: Self-pay | Admitting: Orthopedic Surgery

## 2012-11-26 DIAGNOSIS — M4310 Spondylolisthesis, site unspecified: Secondary | ICD-10-CM

## 2012-12-14 ENCOUNTER — Ambulatory Visit
Admission: RE | Admit: 2012-12-14 | Discharge: 2012-12-14 | Disposition: A | Discharge status: Home / Self Care | Payer: Medicare Other | Source: Ambulatory Visit | Attending: Orthopedic Surgery | Admitting: Orthopedic Surgery

## 2012-12-14 VITALS — BP 141/64 | HR 61

## 2012-12-14 DIAGNOSIS — M4310 Spondylolisthesis, site unspecified: Secondary | ICD-10-CM

## 2012-12-14 MED ORDER — MEPERIDINE HCL 100 MG/ML IJ SOLN
50.0000 mg | Freq: Once | INTRAMUSCULAR | Status: AC
  Administered 2012-12-14: 50 mg via INTRAMUSCULAR

## 2012-12-14 MED ORDER — IOHEXOL 180 MG/ML  SOLN
15.0000 mL | Freq: Once | INTRAMUSCULAR | Status: AC | PRN
  Administered 2012-12-14: 15 mL via INTRATHECAL

## 2012-12-14 MED ORDER — ONDANSETRON HCL 4 MG/2ML IJ SOLN
4.0000 mg | Freq: Once | INTRAMUSCULAR | Status: AC
  Administered 2012-12-14: 4 mg via INTRAMUSCULAR

## 2012-12-14 MED ORDER — DIAZEPAM 5 MG PO TABS: 5.0000 mg | ORAL_TABLET | Freq: Once | ORAL | Status: DC

## 2012-12-14 NOTE — Progress Notes (Signed)
Allergies confirmed.  Discharge instructions explained to patient.  Donell Sievert, RN

## 2012-12-27 NOTE — Progress Notes (Signed)
Dr Darrelyn Hillock-  Need PRE OP ORDERS PLEASE-has appt PST 01/03/13  [facility]

## 2012-12-28 ENCOUNTER — Encounter (HOSPITAL_COMMUNITY): Payer: Self-pay | Admitting: Pharmacy Technician

## 2012-12-31 ENCOUNTER — Other Ambulatory Visit (HOSPITAL_COMMUNITY): Payer: Self-pay | Admitting: Orthopedic Surgery

## 2012-12-31 NOTE — Progress Notes (Signed)
lov dr love neurology 12-24-2012 on chart Cardiac clearance note dr Alanda Amass on chart ekg And lov note dr Alanda Amass 12-02-2012 on chart Stress test 12-17-2010 on chart

## 2012-12-31 NOTE — Patient Instructions (Addendum)
20 Kathryn [name]  12/31/2012   Your procedure is scheduled on: 01-07-2013  Report to [facility] at  115 PM.  Call this number if you have problems the morning of surgery [phone]   Remember:   Do not eat food :After Midnight.   Clear liquids midnight until 0945 am day of surgery, then nothing by mouth   Take these medicines the morning of surgery with A SIP OF WATER: norvasc, flonase nasal spray, pantaprazole, propranolol, symvastatin, zytrec if needed                                SEE [facility] PREPARING FOR SURGERY SHEET   Do not wear jewelry, make-up or nail polish.  Do not wear lotions, powders, or perfumes. You may wear deodorant.   Men may shave face and neck.  Do not bring valuables to the hospital.  Contacts, dentures or bridgework may not be worn into surgery.  Leave suitcase in the car. After surgery it may be brought to your room.  For patients admitted to the hospital, checkout time is 11:00 AM the day of discharge.   Patients discharged the day of surgery will not be allowed to drive home.  Name and phone number of your driver:  Special Instructions: N/A   Please read over the following fact sheets that you were given: MRSA Information.  Call Cain Sieve RN pre op nurse if needed [phone]    FAILURE TO FOLLOW THESE INSTRUCTIONS MAY RESULT IN THE CANCELLATION OF YOUR SURGERY. PATIENT SIGNATURE___________________________________________

## 2013-01-03 ENCOUNTER — Encounter (HOSPITAL_COMMUNITY): Payer: Self-pay

## 2013-01-03 ENCOUNTER — Encounter (HOSPITAL_COMMUNITY)
Admission: RE | Admit: 2013-01-03 | Discharge: 2013-01-03 | Disposition: A | Discharge status: Home / Self Care | Payer: Medicare Other | Source: Ambulatory Visit | Attending: Orthopedic Surgery | Admitting: Orthopedic Surgery

## 2013-01-03 ENCOUNTER — Ambulatory Visit (HOSPITAL_COMMUNITY)
Admission: RE | Admit: 2013-01-03 | Discharge: 2013-01-03 | Disposition: A | Discharge status: Home / Self Care | Payer: Medicare Other | Source: Ambulatory Visit | Attending: Surgical | Admitting: Surgical

## 2013-01-03 DIAGNOSIS — Q762 Congenital spondylolisthesis: Secondary | ICD-10-CM | POA: Insufficient documentation

## 2013-01-03 DIAGNOSIS — Z01812 Encounter for preprocedural laboratory examination: Secondary | ICD-10-CM | POA: Insufficient documentation

## 2013-01-03 DIAGNOSIS — Z01818 Encounter for other preprocedural examination: Secondary | ICD-10-CM | POA: Insufficient documentation

## 2013-01-03 DIAGNOSIS — M48061 Spinal stenosis, lumbar region without neurogenic claudication: Secondary | ICD-10-CM | POA: Insufficient documentation

## 2013-01-03 DIAGNOSIS — I700 Atherosclerosis of aorta: Secondary | ICD-10-CM | POA: Insufficient documentation

## 2013-01-03 DIAGNOSIS — I10 Essential (primary) hypertension: Secondary | ICD-10-CM | POA: Insufficient documentation

## 2013-01-03 HISTORY — DX: Personal history of urinary (tract) infections: Z87.440

## 2013-01-03 LAB — PROTIME-INR
INR: 0.94 (ref 0.00–1.49)
Prothrombin Time: 12.5 seconds (ref 11.6–15.2)

## 2013-01-03 LAB — URINALYSIS, ROUTINE W REFLEX MICROSCOPIC
Bilirubin Urine: NEGATIVE
Glucose, UA: NEGATIVE mg/dL
Hgb urine dipstick: NEGATIVE
Ketones, ur: NEGATIVE mg/dL
Leukocytes, UA: NEGATIVE
Nitrite: NEGATIVE
Protein, ur: NEGATIVE mg/dL
Specific Gravity, Urine: 1.015 (ref 1.005–1.030)
Urobilinogen, UA: 0.2 mg/dL (ref 0.0–1.0)
pH: 8 (ref 5.0–8.0)

## 2013-01-03 LAB — COMPREHENSIVE METABOLIC PANEL
ALT: 12 U/L (ref 0–35)
AST: 18 U/L (ref 0–37)
Albumin: 4.1 g/dL (ref 3.5–5.2)
Alkaline Phosphatase: 53 U/L (ref 39–117)
BUN: 12 mg/dL (ref 6–23)
CO2: 32 mEq/L (ref 19–32)
Calcium: 10.3 mg/dL (ref 8.4–10.5)
Chloride: 96 mEq/L (ref 96–112)
Creatinine, Ser: 0.82 mg/dL (ref 0.50–1.10)
GFR calc Af Amer: 76 mL/min — ABNORMAL LOW (ref >90)
GFR calc non Af Amer: 66 mL/min — ABNORMAL LOW (ref >90)
Glucose, Bld: 101 mg/dL — ABNORMAL HIGH (ref 70–99)
Potassium: 3.9 mEq/L (ref 3.5–5.1)
Sodium: 135 mEq/L (ref 135–145)
Total Bilirubin: 0.8 mg/dL (ref 0.3–1.2)
Total Protein: 7.4 g/dL (ref 6.0–8.3)

## 2013-01-03 LAB — CBC
HCT: 38.3 % (ref 36.0–46.0)
MCV: 90.3 fL (ref 78.0–100.0)
RDW: 12.8 % (ref 11.5–15.5)
WBC: 4.1 10*3/uL (ref 4.0–10.5)

## 2013-01-03 LAB — APTT: aPTT: 37 seconds (ref 24–37)

## 2013-01-05 NOTE — H&P (Signed)
Kathryn [name] is an 79 y.o. female.   Chief Complaint: back pain HPI: Kathryn [name] presents with the chief complaint of back pain and leg weakness. She has been having trouble with this for that past 3 months. She reports that she first noticed the weakness on her legs after she was working in her yard. She reports that her legs gave out on her and she fell against a tree. She denies hitting her head or losing consciousness. She has had trouble with low back pain in the past. She reports that the back pain is not neceassarily any worse than before but she has noticed that she is having pain radiate into the backs of her legs. She denies having trouble with numbness or tingling in the legs. Bladder and bowel function is normal. She has had to other falls in her home since the orginial incident, both times feeling as if the legs were giving out on her. She has been walking with a cane as a result of these incidents. She has been applying heat to her back for relief which does relieve her back pain some, but she is continuing to have weakness in the legs. She has a history of a stroke which has caused some weakness on the left side. She denies headaches but does occasionally have trouble with dizziness. CT myelogram revealed that she has a complete block at L4-5 and she has rather severe lateral recess stenosis at L5-S1 on the left.  Past Medical History  Diagnosis Date  . Depression   . Hypertension   . High cholesterol   . Rectal prolapse   . Hx of adenomatous colonic polyps   . Internal hemorrhoids   . Hypothyroidism   . Fecal incontinence   . TIA (transient ischemic attack)   . Stroke , sept 1, 2010sept 12, 2011    x 2  . History of frequent urinary tract infections     recent  . Migraine     Past Surgical History  Procedure Laterality Date  . Colon surgery  2010, for prolapsed organs after hystecrtomy surgery  . Appendectomy  2008  . Abdominal hysterectomy  2010  . Cholecystectomy      Family  History  Problem Relation Age of Onset  . Stroke Father   . Hypertension Maternal Aunt     x3  . Colon cancer Neg Hx   . Migraines Father    Social History:  reports that she has never smoked. She has never used smokeless tobacco. She reports that she does not drink alcohol or use illicit drugs.  Allergies:  Allergies  Allergen Reactions  . Augmentin (Amoxicillin-Pot Clavulanate) Swelling    Throat   . Citalopram Swelling    Throat and eyes   . Codeine Anaphylaxis  . Ibuprofen Swelling    Throat and eyes   . Latex Swelling and Rash    Swelling to troat  . Lipitor (Atorvastatin) Swelling    throat  . Septra (Bactrim) Anaphylaxis  . Vicodin (Hydrocodone-Acetaminophen) Other (See Comments)    stroke  . Ciprofloxacin Nausea And Vomiting  . Depakene (Valproate Sodium) Hives    All over body   . Isoptin Sr (Verapamil Hcl Er) Cough  . Lisinopril Cough  . Miconazole Rash     Current outpatient prescriptions: amLODipine (NORVASC) 2.5 MG tablet, Take 2.5 mg by mouth 2 (two) times daily. , Disp: , Rfl: ;   aspirin EC 81 MG tablet, Take 81 mg by mouth daily., Disp: ,  Rfl: ;   Bepotastine Besilate (BEPREVE) 1.5 % SOLN, Place 1 drop into both eyes daily as needed (for itchy eyes)., Disp: , Rfl: ;   Calcium Carb-Cholecalciferol (CALCIUM 500/D) 500-400 MG-UNIT CHEW, Chew 300 mg by mouth 2 (two) times daily., Disp: , Rfl:  cetirizine (ZYRTEC) 10 MG tablet, Take 10 mg by mouth as needed for allergies., Disp: , Rfl: ;   clonazePAM (KLONOPIN) 1 MG tablet, Take 0.5-1 mg by mouth at bedtime as needed for anxiety. , Disp: , Rfl: ;   cyanocobalamin 100 MCG tablet, Take 100 mcg by mouth daily., Disp: , Rfl: ;  diazepam (VALIUM) 2 MG tablet, Take 2 mg by mouth every 6 (six) hours as needed. For anxiety, Disp: , Rfl:  docusate sodium (COLACE) 100 MG capsule, Take 100 mg by mouth daily., Disp: , Rfl: ;   estradiol (VIVELLE-DOT) 0.0375 MG/24HR, Place 1 patch onto the skin 2 (two) times a week.,  Disp: , Rfl: ;   fluticasone (FLONASE) 50 MCG/ACT nasal spray, Place 2 sprays into the nose daily as needed. For allergies/congestion, Disp: , Rfl:  hydrocortisone (ANUSOL-HC) 25 MG suppository, Place 25 mg rectally at bedtime. Use 1 supp rectally at bedtime for 10 nights., Disp: , Rfl: ;   hydrOXYzine (ATARAX/VISTARIL) 25 MG tablet, Take 25 mg by mouth 3 (three) times daily as needed. For itching, Disp: , Rfl: ;   hyoscyamine (LEVSIN SL) 0.125 MG SL tablet, Take 0.125 mg by mouth every 8 (eight) hours as needed. For colon spasms, Disp: , Rfl:  losartan (COZAAR) 100 MG tablet, Take 100 mg by mouth every morning., Disp: , Rfl: ;   Multiple Vitamin (MULTIVITAMIN) capsule, Take 1 capsule by mouth daily., Disp: , Rfl: ;   NON FORMULARY, Weekly allergy shots with dr [place] Callas, Disp: , Rfl: ;   pantoprazole (PROTONIX) 40 MG tablet, Take 40 mg by mouth every morning., Disp: , Rfl: ;   Probiotic Product (ALIGN) 4 MG CAPS, Take 1 tablet by mouth daily., Disp: , Rfl:  propranolol (INDERAL) 40 MG tablet, Take 40 mg by mouth 2 (two) times daily. , Disp: , Rfl: ;   simvastatin (ZOCOR) 40 MG tablet, Take 40 mg by mouth every morning. , Disp: , Rfl: ;   traMADol-acetaminophen (ULTRACET) 37.5-325 MG per tablet, Take 1 tablet by mouth every 6 (six) hours as needed for pain., Disp: , Rfl: ;  vitamin E 100 UNIT capsule, Take 100 Units by mouth daily., Disp: , Rfl:  zolpidem (AMBIEN CR) 12.5 MG CR tablet, Take 12.5 mg by mouth at bedtime., Disp: , Rfl:   Results for orders placed during the hospital encounter of 01/03/13 (from the past 48 hour(s))  URINALYSIS, ROUTINE W REFLEX MICROSCOPIC     Status: None   Collection Time    01/03/13 11:44 AM      Result Value Range   Color, Urine YELLOW  YELLOW   APPearance CLEAR  CLEAR   Specific Gravity, Urine 1.015  1.005 - 1.030   pH 8.0  5.0 - 8.0   Glucose, UA NEGATIVE  NEGATIVE mg/dL   Hgb urine dipstick NEGATIVE  NEGATIVE   Bilirubin Urine NEGATIVE  NEGATIVE   Ketones,  ur NEGATIVE  NEGATIVE mg/dL   Protein, ur NEGATIVE  NEGATIVE mg/dL   Urobilinogen, UA 0.2  0.0 - 1.0 mg/dL   Nitrite NEGATIVE  NEGATIVE   Leukocytes, UA NEGATIVE  NEGATIVE   Comment: MICROSCOPIC NOT DONE ON URINES WITH NEGATIVE PROTEIN, BLOOD, LEUKOCYTES, NITRITE, OR GLUCOSE <1000  mg/dL.  SURGICAL PCR SCREEN     Status: None   Collection Time    01/03/13 11:45 AM      Result Value Range   MRSA, PCR NEGATIVE  NEGATIVE   Staphylococcus aureus NEGATIVE  NEGATIVE   Comment:            The Xpert SA Assay (FDA     approved for NASAL specimens     in patients over 51 years of age),     is one component of     a comprehensive surveillance     program.  Test performance has     been validated by [employer] for patients greater     than or equal to 49 year old.     It is not intended     to diagnose infection nor to     guide or monitor treatment.  APTT     Status: None   Collection Time    01/03/13 12:00 PM      Result Value Range   aPTT 37  24 - 37 seconds   Comment:            IF BASELINE aPTT IS ELEVATED,     SUGGEST PATIENT RISK ASSESSMENT     BE USED TO DETERMINE APPROPRIATE     ANTICOAGULANT THERAPY.  COMPREHENSIVE METABOLIC PANEL     Status: Abnormal   Collection Time    01/03/13 12:00 PM      Result Value Range   Sodium 135  135 - 145 mEq/L   Potassium 3.9  3.5 - 5.1 mEq/L   Chloride 96  96 - 112 mEq/L   CO2 32  19 - 32 mEq/L   Glucose, Bld 101 (*) 70 - 99 mg/dL   BUN 12  6 - 23 mg/dL   Creatinine, Ser 1.61  0.50 - 1.10 mg/dL   Calcium 09.6  8.4 - 04.5 mg/dL   Total Protein 7.4  6.0 - 8.3 g/dL   Albumin 4.1  3.5 - 5.2 g/dL   AST 18  0 - 37 U/L   ALT 12  0 - 35 U/L   Alkaline Phosphatase 53  39 - 117 U/L   Total Bilirubin 0.8  0.3 - 1.2 mg/dL   GFR calc non Af Amer 66 (*) >90 mL/min   GFR calc Af Amer 76 (*) >90 mL/min   Comment:            The eGFR has been calculated     using the CKD EPI equation.     This calculation has not been     validated in all  clinical     situations.     eGFR's persistently     <90 mL/min signify     possible Chronic Kidney Disease.  PROTIME-INR     Status: None   Collection Time    01/03/13 12:00 PM      Result Value Range   Prothrombin Time 12.5  11.6 - 15.2 seconds   INR 0.94  0.00 - 1.49  CBC     Status: None   Collection Time    01/03/13 12:00 PM      Result Value Range   WBC 4.1  4.0 - 10.5 K/uL   RBC 4.24  3.87 - 5.11 MIL/uL   Hemoglobin 12.9  12.0 - 15.0 g/dL   HCT 40.9  81.1 - 91.4 %   MCV 90.3  78.0 - 100.0  fL   MCH 30.4  26.0 - 34.0 pg   MCHC 33.7  30.0 - 36.0 g/dL   RDW 47.8  29.5 - 62.1 %   Platelets 206  150 - 400 K/uL   Dg Chest 2 View  01/03/2013  *RADIOLOGY REPORT*  Clinical Data: Hypertension  CHEST - 2 VIEW  Comparison: May 12, 2007.  Findings: Cardiomediastinal silhouette appears normal.  No acute pulmonary disease is noted.  Bony thorax is intact.  IMPRESSION: No acute cardiopulmonary abnormality seen.   Original Report Authenticated By: Lupita Raider.,  M.D.    Dg Lumbar Spine 2-3 Views  01/03/2013  *RADIOLOGY REPORT*  Clinical Data: Spinal stenosis, preop  LUMBAR SPINE - 2-3 VIEW  Comparison: CT myelogram 12/14/2012  Findings: Mild dextroscoliosis of the lumbar spine apex L2.  Stable anterolisthesis of L3-4 and L4-5.  Negative for fracture.  Patchy aortic and pelvic vascular calcifications.  Vascular clips in the right upper abdomen.  IMPRESSION:  1.  Stable mild lumbar dextroscoliosis and anterolisthesis, L3-4 and L4-5.   Original Report Authenticated By: D. Andria Rhein, MD     Review of Systems  Constitutional: Negative for fever, chills, weight loss, malaise/fatigue and diaphoresis.  HENT: Negative.  Negative for neck pain.   Eyes: Negative.   Respiratory: Negative.   Cardiovascular: Negative.   Gastrointestinal: Negative.   Genitourinary: Negative.   Musculoskeletal: Positive for back pain and falls. Negative for myalgias and joint pain.  Skin: Negative.    Neurological: Positive for dizziness, tingling and weakness. Negative for tremors, sensory change, speech change, focal weakness, seizures and loss of consciousness.  Endo/Heme/Allergies: Negative.   Psychiatric/Behavioral: Negative.    Vitals BP: 120/70 (Sitting, Left Arm, Standard)  HR: 68  Physical Exam  Constitutional: She is oriented to person, place, and time. She appears well-developed and well-nourished. No distress.  HENT:  Head: Normocephalic and atraumatic.  Right Ear: External ear normal.  Left Ear: External ear normal.  Nose: Nose normal.  Mouth/Throat: Oropharynx is clear and moist.  Eyes: Conjunctivae and EOM are normal.  Neck: Normal range of motion. Neck supple. No thyromegaly present.  Cardiovascular: Normal rate, regular rhythm, normal heart sounds and intact distal pulses.   No murmur heard. Respiratory: Effort normal and breath sounds normal. No respiratory distress. She has no wheezes. She exhibits no tenderness.  GI: Soft. Bowel sounds are normal. She exhibits no distension and no mass. There is no tenderness.  Musculoskeletal:       Right hip: Normal.       Left hip: Normal.       Right knee: Normal.       Left knee: Normal.       Lumbar back: She exhibits decreased range of motion, tenderness and pain.       Right lower leg: She exhibits no tenderness and no swelling.       Left lower leg: She exhibits no tenderness and no swelling.  SLR is slightly positive on the left, Suprisingly, neurologically intact in LE  Lymphadenopathy:    She has no cervical adenopathy.  Neurological: She is alert and oriented to person, place, and time. She has normal reflexes. No sensory deficit.  Skin: No rash noted. She is not diaphoretic. No erythema.  Psychiatric: She has a normal mood and affect. Her behavior is normal.     Assessment/Plan Spinal stenosis, lumbar spine She needs to have a central decompressive lumbar laminectomy at L4-5 and at L5-S1 and possibly  at 3-4. The  possible complications of spinal surgery number one could be infection, which is extremely rare. We do use antibiotics prior to the surgery and during surgery and after surgery. Number two is always a slight degree of probability that you could develop a blood clot in your leg after any type of surgery and we try our best to prevent that with aspirin post op when it is safe to begin. The third is a dural leak. That is the spinal fluid leak that could occur. At certain rare times the bone or the disc could literally stick to the dura which is the lining which contains the spinal fluid and we could develop a small tear in that lining which we then patch up. That is an extremely rare complication. The last and final complication is a recurrent disc rupture. That means that you could rupture another small piece of disc later on down the road and there is about a 2% chance of that.  Frans Valente LAUREN 01/05/2013, 8:23 AM

## 2013-01-06 NOTE — Progress Notes (Signed)
PT INFORMED OF TIME CHANGE TO 2:30 PM AND TO ARRIVE AT [facility] AT 12:00 NOON  INSTRUCTED MAY HAVE CLEAR LIQUIDS 12:00 AM TO 8:30 AM THEN NPO TO TAKE MEDS AS PREVIOUSLY INSTRUCTED  

## 2013-01-07 ENCOUNTER — Ambulatory Visit (HOSPITAL_COMMUNITY): Payer: Medicare Other

## 2013-01-07 ENCOUNTER — Ambulatory Visit (HOSPITAL_COMMUNITY): Payer: Medicare Other | Admitting: Registered Nurse

## 2013-01-07 ENCOUNTER — Encounter (HOSPITAL_COMMUNITY): Payer: Self-pay | Admitting: *Deleted

## 2013-01-07 ENCOUNTER — Inpatient Hospital Stay (HOSPITAL_COMMUNITY)
Admission: RE | Admit: 2013-01-07 | Discharge: 2013-01-10 | DRG: 490 | Disposition: A | Discharge status: Skilled Nursing Facility | Payer: Medicare Other | Source: Ambulatory Visit | Attending: Orthopedic Surgery | Admitting: Orthopedic Surgery

## 2013-01-07 ENCOUNTER — Encounter (HOSPITAL_COMMUNITY)
Admission: RE | Disposition: A | Discharge status: Skilled Nursing Facility | Payer: Self-pay | Source: Ambulatory Visit | Attending: Orthopedic Surgery

## 2013-01-07 ENCOUNTER — Encounter (HOSPITAL_COMMUNITY): Payer: Self-pay | Admitting: Registered Nurse

## 2013-01-07 DIAGNOSIS — Z8673 Personal history of transient ischemic attack (TIA), and cerebral infarction without residual deficits: Secondary | ICD-10-CM

## 2013-01-07 DIAGNOSIS — Z79899 Other long term (current) drug therapy: Secondary | ICD-10-CM

## 2013-01-07 DIAGNOSIS — Z791 Long term (current) use of non-steroidal anti-inflammatories (NSAID): Secondary | ICD-10-CM

## 2013-01-07 DIAGNOSIS — I10 Essential (primary) hypertension: Secondary | ICD-10-CM | POA: Diagnosis present

## 2013-01-07 DIAGNOSIS — E7800 Pure hypercholesterolemia, unspecified: Secondary | ICD-10-CM | POA: Diagnosis present

## 2013-01-07 DIAGNOSIS — E039 Hypothyroidism, unspecified: Secondary | ICD-10-CM | POA: Diagnosis present

## 2013-01-07 DIAGNOSIS — M48062 Spinal stenosis, lumbar region with neurogenic claudication: Principal | ICD-10-CM | POA: Diagnosis present

## 2013-01-07 DIAGNOSIS — F329 Major depressive disorder, single episode, unspecified: Secondary | ICD-10-CM | POA: Diagnosis present

## 2013-01-07 DIAGNOSIS — Z7982 Long term (current) use of aspirin: Secondary | ICD-10-CM

## 2013-01-07 DIAGNOSIS — F3289 Other specified depressive episodes: Secondary | ICD-10-CM | POA: Diagnosis present

## 2013-01-07 DIAGNOSIS — R000 Tachycardia, unspecified: Secondary | ICD-10-CM | POA: Diagnosis not present

## 2013-01-07 DIAGNOSIS — D62 Acute posthemorrhagic anemia: Secondary | ICD-10-CM | POA: Diagnosis not present

## 2013-01-07 HISTORY — PX: LUMBAR LAMINECTOMY/DECOMPRESSION MICRODISCECTOMY: SHX5026

## 2013-01-07 LAB — ABO/RH: ABO/RH(D): A POS

## 2013-01-07 LAB — TYPE AND SCREEN
ABO/RH(D): A POS
Antibody Screen: NEGATIVE

## 2013-01-07 SURGERY — LUMBAR LAMINECTOMY/DECOMPRESSION MICRODISCECTOMY 2 LEVELS
Anesthesia: General | Site: Back | Wound class: Clean

## 2013-01-07 MED ORDER — LACTATED RINGERS IV SOLN: INTRAVENOUS | Status: DC

## 2013-01-07 MED ORDER — BUPIVACAINE LIPOSOME 1.3 % IJ SUSP
INTRAMUSCULAR | Status: DC | PRN
  Administered 2013-01-07: 20 mL

## 2013-01-07 MED ORDER — CLINDAMYCIN PHOSPHATE 900 MG/50ML IV SOLN
INTRAVENOUS | Status: AC
  Filled 2013-01-07: qty 50

## 2013-01-07 MED ORDER — CLONAZEPAM 0.5 MG PO TABS
0.5000 mg | ORAL_TABLET | Freq: Every day | ORAL | Status: DC
  Administered 2013-01-07 – 2013-01-09 (×3): 0.5 mg via ORAL
  Filled 2013-01-07 (×3): qty 1

## 2013-01-07 MED ORDER — HYDROMORPHONE HCL PF 1 MG/ML IJ SOLN: 0.5000 mg | INTRAMUSCULAR | Status: DC | PRN

## 2013-01-07 MED ORDER — BACITRACIN-NEOMYCIN-POLYMYXIN 400-5-5000 EX OINT
TOPICAL_OINTMENT | CUTANEOUS | Status: AC
  Filled 2013-01-07: qty 1

## 2013-01-07 MED ORDER — BUPIVACAINE LIPOSOME 1.3 % IJ SUSP
20.0000 mL | Freq: Once | INTRAMUSCULAR | Status: DC
  Filled 2013-01-07: qty 20

## 2013-01-07 MED ORDER — PROPOFOL 10 MG/ML IV EMUL
INTRAVENOUS | Status: DC | PRN
  Administered 2013-01-07: 100 mg via INTRAVENOUS

## 2013-01-07 MED ORDER — MEPERIDINE HCL 25 MG/ML IJ SOLN
12.5000 mg | INTRAMUSCULAR | Status: DC | PRN
  Administered 2013-01-07: 12.5 mg via INTRAVENOUS
  Filled 2013-01-07: qty 1

## 2013-01-07 MED ORDER — MEPERIDINE HCL 50 MG PO TABS
50.0000 mg | ORAL_TABLET | ORAL | Status: DC | PRN
  Administered 2013-01-07 – 2013-01-10 (×10): 50 mg via ORAL
  Filled 2013-01-07 (×10): qty 1

## 2013-01-07 MED ORDER — HYDROMORPHONE HCL PF 1 MG/ML IJ SOLN: 0.2500 mg | INTRAMUSCULAR | Status: DC | PRN

## 2013-01-07 MED ORDER — GLYCOPYRROLATE 0.2 MG/ML IJ SOLN
INTRAMUSCULAR | Status: DC | PRN
  Administered 2013-01-07: .5 mg via INTRAVENOUS

## 2013-01-07 MED ORDER — PANTOPRAZOLE SODIUM 40 MG PO TBEC
40.0000 mg | DELAYED_RELEASE_TABLET | Freq: Every morning | ORAL | Status: DC
  Administered 2013-01-08 – 2013-01-10 (×3): 40 mg via ORAL
  Filled 2013-01-07 (×3): qty 1

## 2013-01-07 MED ORDER — HYDROMORPHONE HCL 2 MG PO TABS: 2.0000 mg | ORAL_TABLET | Freq: Four times a day (QID) | ORAL | Status: DC | PRN

## 2013-01-07 MED ORDER — LIDOCAINE HCL (CARDIAC) 20 MG/ML IV SOLN
INTRAVENOUS | Status: DC | PRN
  Administered 2013-01-07: 50 mg via INTRAVENOUS

## 2013-01-07 MED ORDER — BISACODYL 10 MG RE SUPP: 10.0000 mg | Freq: Every day | RECTAL | Status: DC | PRN

## 2013-01-07 MED ORDER — SIMVASTATIN 40 MG PO TABS
40.0000 mg | ORAL_TABLET | Freq: Every day | ORAL | Status: DC
  Administered 2013-01-09: 40 mg via ORAL
  Filled 2013-01-07 (×3): qty 1

## 2013-01-07 MED ORDER — FLUTICASONE PROPIONATE 50 MCG/ACT NA SUSP
2.0000 | Freq: Every day | NASAL | Status: DC | PRN
  Filled 2013-01-07: qty 16

## 2013-01-07 MED ORDER — THROMBIN 5000 UNITS EX SOLR
CUTANEOUS | Status: DC | PRN
  Administered 2013-01-07: 5000 [IU] via TOPICAL

## 2013-01-07 MED ORDER — LACTATED RINGERS IV SOLN
INTRAVENOUS | Status: DC
  Administered 2013-01-07 (×2): via INTRAVENOUS

## 2013-01-07 MED ORDER — CLINDAMYCIN PHOSPHATE 900 MG/50ML IV SOLN
900.0000 mg | INTRAVENOUS | Status: DC
  Filled 2013-01-07: qty 50

## 2013-01-07 MED ORDER — NEOSTIGMINE METHYLSULFATE 1 MG/ML IJ SOLN
INTRAMUSCULAR | Status: DC | PRN
  Administered 2013-01-07: 3 mg via INTRAVENOUS

## 2013-01-07 MED ORDER — METHOCARBAMOL 500 MG PO TABS
500.0000 mg | ORAL_TABLET | Freq: Four times a day (QID) | ORAL | Status: DC | PRN
  Administered 2013-01-07 – 2013-01-09 (×6): 500 mg via ORAL
  Filled 2013-01-07 (×6): qty 1

## 2013-01-07 MED ORDER — BEPOTASTINE BESILATE 1.5 % OP SOLN: 1.0000 [drp] | Freq: Every day | OPHTHALMIC | Status: DC | PRN

## 2013-01-07 MED ORDER — AMLODIPINE BESYLATE 2.5 MG PO TABS
2.5000 mg | ORAL_TABLET | Freq: Two times a day (BID) | ORAL | Status: DC
  Administered 2013-01-08 – 2013-01-10 (×4): 2.5 mg via ORAL
  Filled 2013-01-07 (×6): qty 1

## 2013-01-07 MED ORDER — CLINDAMYCIN PHOSPHATE 900 MG/50ML IV SOLN
INTRAVENOUS | Status: DC | PRN
  Administered 2013-01-07: 900 mg via INTRAVENOUS

## 2013-01-07 MED ORDER — BUPIVACAINE-EPINEPHRINE 0.25% -1:200000 IJ SOLN
INTRAMUSCULAR | Status: AC
  Filled 2013-01-07: qty 1

## 2013-01-07 MED ORDER — POLYETHYLENE GLYCOL 3350 17 G PO PACK
17.0000 g | PACK | Freq: Every day | ORAL | Status: DC | PRN
  Administered 2013-01-09: 17 g via ORAL
  Filled 2013-01-07: qty 1

## 2013-01-07 MED ORDER — ACETAMINOPHEN 10 MG/ML IV SOLN
INTRAVENOUS | Status: AC
  Filled 2013-01-07: qty 100

## 2013-01-07 MED ORDER — AMLODIPINE BESYLATE 2.5 MG PO TABS
2.5000 mg | ORAL_TABLET | Freq: Two times a day (BID) | ORAL | Status: DC
  Filled 2013-01-07: qty 1

## 2013-01-07 MED ORDER — PROPRANOLOL HCL 40 MG PO TABS
40.0000 mg | ORAL_TABLET | Freq: Two times a day (BID) | ORAL | Status: DC
  Filled 2013-01-07: qty 1

## 2013-01-07 MED ORDER — THROMBIN 5000 UNITS EX SOLR
CUTANEOUS | Status: AC
  Filled 2013-01-07: qty 10000

## 2013-01-07 MED ORDER — ACETAMINOPHEN 10 MG/ML IV SOLN
INTRAVENOUS | Status: DC | PRN
  Administered 2013-01-07: 1000 mg via INTRAVENOUS

## 2013-01-07 MED ORDER — PROPRANOLOL HCL 40 MG PO TABS
40.0000 mg | ORAL_TABLET | Freq: Two times a day (BID) | ORAL | Status: DC
  Administered 2013-01-08 – 2013-01-10 (×5): 40 mg via ORAL
  Filled 2013-01-07 (×6): qty 1

## 2013-01-07 MED ORDER — METHOCARBAMOL 100 MG/ML IJ SOLN: 500.0000 mg | Freq: Four times a day (QID) | INTRAVENOUS | Status: DC | PRN

## 2013-01-07 MED ORDER — ONDANSETRON HCL 4 MG/2ML IJ SOLN: 4.0000 mg | INTRAMUSCULAR | Status: DC | PRN

## 2013-01-07 MED ORDER — SUCCINYLCHOLINE CHLORIDE 20 MG/ML IJ SOLN
INTRAMUSCULAR | Status: DC | PRN
  Administered 2013-01-07: 100 mg via INTRAVENOUS

## 2013-01-07 MED ORDER — LOSARTAN POTASSIUM 50 MG PO TABS: 100.0000 mg | ORAL_TABLET | Freq: Every morning | ORAL | Status: DC

## 2013-01-07 MED ORDER — PHENYLEPHRINE HCL 10 MG/ML IJ SOLN
INTRAMUSCULAR | Status: DC | PRN
  Administered 2013-01-07 (×3): 40 ug via INTRAVENOUS
  Administered 2013-01-07: 80 ug via INTRAVENOUS

## 2013-01-07 MED ORDER — MIDAZOLAM HCL 5 MG/5ML IJ SOLN
INTRAMUSCULAR | Status: DC | PRN
  Administered 2013-01-07 (×2): 0.5 mg via INTRAVENOUS

## 2013-01-07 MED ORDER — ONDANSETRON HCL 4 MG/2ML IJ SOLN
INTRAMUSCULAR | Status: DC | PRN
  Administered 2013-01-07 (×2): 2 mg via INTRAVENOUS

## 2013-01-07 MED ORDER — ZOLPIDEM TARTRATE 5 MG PO TABS
5.0000 mg | ORAL_TABLET | Freq: Every day | ORAL | Status: DC
  Administered 2013-01-07 – 2013-01-09 (×3): 5 mg via ORAL
  Filled 2013-01-07 (×3): qty 1

## 2013-01-07 MED ORDER — LOSARTAN POTASSIUM 50 MG PO TABS
100.0000 mg | ORAL_TABLET | Freq: Every day | ORAL | Status: DC
  Administered 2013-01-08 – 2013-01-10 (×3): 100 mg via ORAL
  Filled 2013-01-07 (×3): qty 2

## 2013-01-07 MED ORDER — FLEET ENEMA 7-19 GM/118ML RE ENEM: 1.0000 | ENEMA | Freq: Once | RECTAL | Status: AC | PRN

## 2013-01-07 MED ORDER — VITAMIN B-12 100 MCG PO TABS
100.0000 ug | ORAL_TABLET | Freq: Every day | ORAL | Status: DC
  Administered 2013-01-08 – 2013-01-10 (×3): 100 ug via ORAL
  Filled 2013-01-07 (×3): qty 1

## 2013-01-07 MED ORDER — FENTANYL CITRATE 0.05 MG/ML IJ SOLN
INTRAMUSCULAR | Status: DC | PRN
  Administered 2013-01-07: 50 ug via INTRAVENOUS
  Administered 2013-01-07: 100 ug via INTRAVENOUS
  Administered 2013-01-07 (×2): 50 ug via INTRAVENOUS

## 2013-01-07 MED ORDER — ROCURONIUM BROMIDE 100 MG/10ML IV SOLN
INTRAVENOUS | Status: DC | PRN
  Administered 2013-01-07: 40 mg via INTRAVENOUS

## 2013-01-07 MED ORDER — VITAMIN B-12 100 MCG PO TABS: 100.0000 ug | ORAL_TABLET | Freq: Every day | ORAL | Status: DC

## 2013-01-07 SURGICAL SUPPLY — 47 items
BAG SPEC THK2 15X12 ZIP CLS (MISCELLANEOUS) ×1
BAG ZIPLOCK 12X15 (MISCELLANEOUS) ×2 IMPLANT
BENZOIN TINCTURE PRP APPL 2/3 (GAUZE/BANDAGES/DRESSINGS) ×2 IMPLANT
CATH FOLEY LATEX FREE 16FR (CATHETERS) ×2 IMPLANT
CLEANER TIP ELECTROSURG 2X2 (MISCELLANEOUS) ×2 IMPLANT
CLOTH BEACON ORANGE TIMEOUT ST (SAFETY) ×2 IMPLANT
CONT SPECI 4OZ STER CLIK (MISCELLANEOUS) ×2 IMPLANT
DRAIN PENROSE 18X1/4 LTX STRL (WOUND CARE) IMPLANT
DRAPE MICROSCOPE LEICA (MISCELLANEOUS) ×2 IMPLANT
DRAPE POUCH INSTRU U-SHP 10X18 (DRAPES) ×2 IMPLANT
DRAPE SURG 17X11 SM STRL (DRAPES) ×2 IMPLANT
DRSG ADAPTIC 3X8 NADH LF (GAUZE/BANDAGES/DRESSINGS) ×2 IMPLANT
DRSG EMULSION OIL 3X16 NADH (GAUZE/BANDAGES/DRESSINGS) ×2 IMPLANT
DRSG PAD ABDOMINAL 8X10 ST (GAUZE/BANDAGES/DRESSINGS) ×2 IMPLANT
DURAPREP 26ML APPLICATOR (WOUND CARE) ×2 IMPLANT
ELECT REM PT RETURN 9FT ADLT (ELECTROSURGICAL) ×2
ELECTRODE REM PT RTRN 9FT ADLT (ELECTROSURGICAL) ×1 IMPLANT
GLOVE BIOGEL PI IND STRL 8 (GLOVE) ×2 IMPLANT
GLOVE BIOGEL PI INDICATOR 8 (GLOVE) ×2
GLOVE ECLIPSE 8.0 STRL XLNG CF (GLOVE) ×4 IMPLANT
GOWN PREVENTION PLUS LG XLONG (DISPOSABLE) ×4 IMPLANT
GOWN STRL REIN XL XLG (GOWN DISPOSABLE) ×4 IMPLANT
KIT BASIN OR (CUSTOM PROCEDURE TRAY) ×2 IMPLANT
KIT POSITIONING SURG ANDREWS (MISCELLANEOUS) ×2 IMPLANT
MANIFOLD NEPTUNE II (INSTRUMENTS) ×2 IMPLANT
NEEDLE SPNL 18GX3.5 QUINCKE PK (NEEDLE) ×4 IMPLANT
NS IRRIG 1000ML POUR BTL (IV SOLUTION) ×2 IMPLANT
PATTIES SURGICAL .5 X.5 (GAUZE/BANDAGES/DRESSINGS) IMPLANT
PATTIES SURGICAL .75X.75 (GAUZE/BANDAGES/DRESSINGS) IMPLANT
PATTIES SURGICAL 1X1 (DISPOSABLE) IMPLANT
PIN SAFETY NICK PLATE  2 MED (MISCELLANEOUS)
PIN SAFETY NICK PLATE 2 MED (MISCELLANEOUS) IMPLANT
POSITIONER SURGICAL ARM (MISCELLANEOUS) ×2 IMPLANT
SPONGE GAUZE 4X4 12PLY (GAUZE/BANDAGES/DRESSINGS) ×2 IMPLANT
SPONGE LAP 4X18 X RAY DECT (DISPOSABLE) IMPLANT
SPONGE SURGIFOAM ABS GEL 100 (HEMOSTASIS) ×2 IMPLANT
STAPLER VISISTAT 35W (STAPLE) IMPLANT
SUT VIC AB 0 CT1 27 (SUTURE) ×2
SUT VIC AB 0 CT1 27XBRD ANTBC (SUTURE) ×1 IMPLANT
SUT VIC AB 1 CT1 27 (SUTURE) ×8
SUT VIC AB 1 CT1 27XBRD ANTBC (SUTURE) ×4 IMPLANT
SUT VIC AB 2-0 CT1 27 (SUTURE) ×2
SUT VIC AB 2-0 CT1 27XBRD (SUTURE) ×1 IMPLANT
TAPE CLOTH SURG 4X10 WHT LF (GAUZE/BANDAGES/DRESSINGS) ×2 IMPLANT
TOWEL OR 17X26 10 PK STRL BLUE (TOWEL DISPOSABLE) ×4 IMPLANT
TRAY LAMINECTOMY (CUSTOM PROCEDURE TRAY) ×2 IMPLANT
WATER STERILE IRR 1500ML POUR (IV SOLUTION) ×2 IMPLANT

## 2013-01-07 NOTE — Interval H&P Note (Signed)
History and Physical Interval Note:  01/07/2013 2:15 PM  Kathryn [name]  has presented today for surgery, with the diagnosis of SPINAL STENOSIS   The various methods of treatment have been discussed with the patient and family. After consideration of risks, benefits and other options for treatment, the patient has consented to  Procedure(s): LUMBAR LAMINECTOMY CENTRAL DECOMPRESSION L5-S1, POSSIBLE L3-L4      (N/A) as a surgical intervention .  The patient's history has been reviewed, patient examined, no change in status, stable for surgery.  I have reviewed the patient's chart and labs.  Questions were answered to the patient's satisfaction.     Lakedra Washington A

## 2013-01-07 NOTE — Anesthesia Preprocedure Evaluation (Addendum)
Anesthesia Evaluation  Patient identified by MRN, date of birth, ID band Patient awake    Reviewed: Allergy & Precautions, H&P , NPO status , Patient's Chart, lab work & pertinent test results, reviewed documented beta blocker date and time   Airway Mallampati: III TM Distance: >3 FB Neck ROM: full    Dental  (+) Caps and Dental Advisory Given All front teeth are capped:   Pulmonary neg pulmonary ROS, sleep apnea ,  breath sounds clear to auscultation  Pulmonary exam normal       Cardiovascular Exercise Tolerance: Good hypertension, Pt. 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  Only some balance problems TIACVA, Residual Symptoms negative neurological ROS  negative psych ROS   GI/Hepatic negative GI ROS, Neg liver ROS,   Endo/Other  negative endocrine ROSHypothyroidism   Renal/GU negative Renal ROS  negative genitourinary   Musculoskeletal   Abdominal   Peds  Hematology negative hematology ROS (+)   Anesthesia Other Findings   Reproductive/Obstetrics negative OB ROS                          Anesthesia Physical Anesthesia Plan  ASA: III  Anesthesia Plan: General   Post-op Pain Management:    Induction: Intravenous  Airway Management Planned: Oral ETT  Additional Equipment:   Intra-op Plan:   Post-operative Plan: Extubation in OR  Informed Consent: I have reviewed the patients History and Physical, chart, labs and discussed the procedure including the risks, benefits and alternatives for the proposed anesthesia with the patient or authorized representative who has indicated his/her understanding and acceptance.   Dental Advisory Given  Plan Discussed with: CRNA and Surgeon  Anesthesia Plan Comments:         Anesthesia Quick Evaluation

## 2013-01-07 NOTE — Transfer of Care (Signed)
Immediate Anesthesia Transfer of Care Note  Patient: Kathryn [name]  Procedure(s) Performed: Procedure(s) (LRB): LUMBAR LAMINECTOMY CENTRAL DECOMPRESSION L4-L5, BILATERAL FORAMENOTOMY L4,L5     (N/A)  Patient Location: PACU  Anesthesia Type: General  Level of Consciousness: sedated, patient cooperative and responds to stimulaton  Airway & Oxygen Therapy: Patient Spontanous Breathing and Patient connected to face mask oxgen  Post-op Assessment: Report given to PACU RN and Post -op Vital signs reviewed and stable  Post vital signs: Reviewed and stable  Complications: No apparent anesthesia complications

## 2013-01-07 NOTE — Anesthesia Postprocedure Evaluation (Signed)
  Anesthesia Post-op Note  Patient: Kathryn [name]  Procedure(s) Performed: Procedure(s) (LRB): LUMBAR LAMINECTOMY CENTRAL DECOMPRESSION L4-L5, BILATERAL FORAMENOTOMY L4,L5     (N/A)  Patient Location: PACU  Anesthesia Type: General  Level of Consciousness: awake and alert   Airway and Oxygen Therapy: Patient Spontanous Breathing  Post-op Pain: mild  Post-op Assessment: Post-op Vital signs reviewed, Patient's Cardiovascular Status Stable, Respiratory Function Stable, Patent Airway and No signs of Nausea or vomiting  Last Vitals:  Filed Vitals:   01/07/13 1831  BP: 125/71  Pulse: 51  Temp: 36.8 C  Resp: 16    Post-op Vital Signs: stable   Complications: No apparent anesthesia complications

## 2013-01-07 NOTE — Brief Op Note (Signed)
01/07/2013  4:36 PM  PATIENT:  Kathryn [name]  79 y.o. female  PRE-OPERATIVE DIAGNOSIS:  SPINAL STENOSIS L-3-L-4,L-4-L-5  POST-OPERATIVE DIAGNOSIS:  spinal stenosis,Two levels,L-3-L-4,L-4-L-5  PROCEDURE:  Procedure(s): LUMBAR LAMINECTOMY CENTRAL DECOMPRESSION L4-L5, BILATERAL FORAMENOTOMY L4,L5     (N/A) and L-3-L-4 with Bilateral Foraminotomies for L-3 and L-4 roots,Two Levels.  SURGEON:  Surgeon(s) and Role:    * Jacki Cones, MD - Primary    * Drucilla Schmidt, MD - Assisting     ASSISTANTS: Marlowe Kays MD   ANESTHESIA:   general  EBL:  Total I/O In: 1000 [I.V.:1000] Out: 350 [Urine:350]  BLOOD ADMINISTERED:none  DRAINS: none   LOCAL MEDICATIONS USED:  BUPIVICAINE 20cc.  SPECIMEN:  No Specimen  DISPOSITION OF SPECIMEN:  N/A  COUNTS:  YES  TOURNIQUET:  * No tourniquets in log *  DICTATION: .Other Dictation: Dictation Number [id number]  PLAN OF CARE: Admit to inpatient   PATIENT DISPOSITION:  PACU - hemodynamically stable.   Delay start of Pharmacological VTE agent (>24hrs) due to surgical blood loss or risk of bleeding: yes

## 2013-01-07 NOTE — H&P (View-Only) (Signed)
PT INFORMED OF TIME CHANGE TO 2:30 PM AND TO ARRIVE AT SHORT STAY CENTER AT 12:00 NOON  INSTRUCTED MAY HAVE CLEAR LIQUIDS 12:00 AM TO 8:30 AM THEN NPO TO TAKE MEDS AS PREVIOUSLY INSTRUCTED

## 2013-01-08 ENCOUNTER — Inpatient Hospital Stay (HOSPITAL_COMMUNITY): Payer: Medicare Other

## 2013-01-08 LAB — BASIC METABOLIC PANEL
CO2: 33 mEq/L — ABNORMAL HIGH (ref 19–32)
Chloride: 101 mEq/L (ref 96–112)
Creatinine, Ser: 0.78 mg/dL (ref 0.50–1.10)
GFR calc Af Amer: 89 mL/min — ABNORMAL LOW (ref >90)
Potassium: 3.3 mEq/L — ABNORMAL LOW (ref 3.5–5.1)

## 2013-01-08 LAB — CBC
HCT: 29.8 % — ABNORMAL LOW (ref 36.0–46.0)
MCV: 89 fL (ref 78.0–100.0)
Platelets: 135 10*3/uL — ABNORMAL LOW (ref 150–400)
RBC: 3.35 MIL/uL — ABNORMAL LOW (ref 3.87–5.11)
RDW: 12.7 % (ref 11.5–15.5)
WBC: 3.9 10*3/uL — ABNORMAL LOW (ref 4.0–10.5)

## 2013-01-08 LAB — D-DIMER, QUANTITATIVE: D-Dimer, Quant: 1.35 ug/mL-FEU — ABNORMAL HIGH (ref 0.00–0.48)

## 2013-01-08 MED ORDER — SODIUM CHLORIDE 0.9 % IV BOLUS (SEPSIS)
250.0000 mL | Freq: Once | INTRAVENOUS | Status: AC
  Administered 2013-01-08: 250 mL via INTRAVENOUS

## 2013-01-08 MED ORDER — POTASSIUM CHLORIDE CRYS ER 20 MEQ PO TBCR
40.0000 meq | EXTENDED_RELEASE_TABLET | Freq: Two times a day (BID) | ORAL | Status: AC
  Administered 2013-01-08 (×2): 40 meq via ORAL
  Filled 2013-01-08 (×2): qty 2

## 2013-01-08 MED ORDER — IOHEXOL 350 MG/ML SOLN
100.0000 mL | Freq: Once | INTRAVENOUS | Status: AC | PRN
  Administered 2013-01-08: 100 mL via INTRAVENOUS

## 2013-01-08 MED ORDER — METHOCARBAMOL 500 MG PO TABS: 500.0000 mg | ORAL_TABLET | Freq: Four times a day (QID) | ORAL | Status: DC | PRN

## 2013-01-08 MED ORDER — BLISTEX EX OINT
TOPICAL_OINTMENT | CUTANEOUS | Status: AC
  Administered 2013-01-08: 02:00:00
  Filled 2013-01-08: qty 10

## 2013-01-08 NOTE — Evaluation (Signed)
Occupational Therapy Evaluation Patient Details Name: Kathryn [name] MRN: [id number] DOB: 1930-10-07 Today's Date: 01/08/2013 Time: 4098-1191 OT Time Calculation (min): 35 min  OT Assessment / Plan / Recommendation Clinical Impression  This 79 year old female was admitted for L3-4 and L4-5 decompression.  She is very independent at home, managing all adls, iadls and driving.  She will benefit from skilled OT to increase independence and safety with adls, with supervision level goals following back precautions.      OT Assessment  Patient needs continued OT Services    Follow Up Recommendations  SNF    Barriers to Discharge Decreased caregiver support    Equipment Recommendations  3 in 1 bedside comode    Recommendations for Other Services    Frequency  Min 2X/week    Precautions / Restrictions Precautions Precautions: Back Precaution Booklet Issued: Yes (comment) Precaution Comments: sign in room Restrictions Weight Bearing Restrictions: No   Pertinent Vitals/Pain Pt was premedicated and reports pain was "not bad".  Repositioned    ADL  Grooming: Teeth care;Set up Where Assessed - Grooming: Supported sitting Upper Body Bathing: Set up Where Assessed - Upper Body Bathing: Supported sitting Lower Body Bathing: Minimal assistance (with ae) Where Assessed - Lower Body Bathing: Supported sit to stand Upper Body Dressing: Minimal assistance Where Assessed - Upper Body Dressing: Unsupported sitting Lower Body Dressing: Moderate assistance (with ae) Where Assessed - Lower Body Dressing: Supported sit to stand Toilet Transfer: Mining engineer Method: Sit to Barista:  (bed to chair) Toileting - Clothing Manipulation and Hygiene: Minimal assistance Where Assessed - Engineer, mining and Hygiene: Sit to stand from 3-in-1 or toilet Equipment Used: Rolling walker;Reacher;Sock aid Transfers/Ambulation Related to ADLs:  pt ambulated with and without walker.  Tends to favor/lean towards R side.  H/o CVA.  Pt is very independent and prefers not to use walker ADL Comments: Educated on alternative positions for adls.  She will likely benefit from use of AE.  Began education with this    OT Diagnosis: Generalized weakness  OT Problem List: Decreased strength;Decreased activity tolerance;Decreased knowledge of use of DME or AE;Decreased knowledge of precautions OT Treatment Interventions: Self-care/ADL training;DME and/or AE instruction;Patient/family education   OT Goals Acute Rehab OT Goals OT Goal Formulation: With patient Time For Goal Achievement: 01/15/13 Potential to Achieve Goals: Good ADL Goals Pt Will Perform Lower Body Bathing: with supervision;Sit to stand from chair;with adaptive equipment ADL Goal: Lower Body Bathing - Progress: Goal set today Pt Will Perform Lower Body Dressing: with supervision;Sit to stand from chair;with adaptive equipment ADL Goal: Lower Body Dressing - Progress: Goal set today Pt Will Transfer to Toilet: with supervision;Ambulation;3-in-1 ADL Goal: Toilet Transfer - Progress: Goal set today Pt Will Perform Toileting - Hygiene: with supervision;Standing at 3-in-1/toilet (with ae, prn) ADL Goal: Toileting - Hygiene - Progress: Goal set today  Visit Information  Last OT Received On: 01/08/13 Assistance Needed: +1    Subjective Data  Subjective: I changed the spark plugs in my mower.  I love to mow and it's not one of those driving things Patient Stated Goal: get home.  Son is looking at [employer] prior to home   Prior Functioning     Home Living Lives With: Alone Type of Home: House Home Access: Level entry Home Layout: One level Bathroom Shower/Tub: Engineer, manufacturing systems: Handicapped height Home Adaptive Equipment: None Prior Function Level of Independence: Independent Driving: Yes Communication Communication: No difficulties Dominant Hand:  Right         Vision/Perception     Cognition  Cognition Arousal/Alertness: Awake/alert Behavior During Therapy: WFL for tasks assessed/performed Overall Cognitive Status: Within Functional Limits for tasks assessed    Extremity/Trunk Assessment Right Upper Extremity Assessment RUE ROM/Strength/Tone: [facility] for tasks assessed Left Upper Extremity Assessment LUE ROM/Strength/Tone: WFL for tasks assessed     Mobility Bed Mobility Bed Mobility: Right Sidelying to Sit;Rolling Right Rolling Right: 4: Min assist;With rail Right Sidelying to Sit: 4: Min assist;With rails;HOB flat Details for Bed Mobility Assistance: cues for technique and min A for trunk/legs due to getting caught on sheet Transfers Transfers: Sit to Stand Sit to Stand: 4: Min assist;From bed;With upper extremity assist     Exercise     Balance     End of Session OT - End of Session Activity Tolerance: Patient tolerated treatment well Patient left: in chair;with call bell/phone within reach;with nursing in room Nurse Communication: Mobility status  GO     Kathryn [name] 01/08/2013, 4:15 PM Marica Otter, OTR/L [phone] 01/08/2013

## 2013-01-08 NOTE — Progress Notes (Signed)
Pt sudden SOB 60% then back to 100%.  Pulse suddenly 155 then pulse normalizes in minutes.  Blood tinged sputum.  MD notified and orders given.

## 2013-01-08 NOTE — Evaluation (Signed)
Physical Therapy Evaluation Patient Details Name: Kathryn [name] MRN: [id number] DOB: 1931-07-05 Today's Date: 01/08/2013 Time: 1478-2956 PT Time Calculation (min): 39 min  PT Assessment / Plan / Recommendation Clinical Impression  This 79 year old female was admitted for L3-4 and L4-5 decompression.  She is very independent at home with all activities and would like to return to this lifestyle; Pt would benefit from post acute rehab.     PT Assessment  Patient needs continued PT services    Follow Up Recommendations  SNF    Does the patient have the potential to tolerate intense rehabilitation      Barriers to Discharge        Equipment Recommendations  Rolling walker with 5" wheels    Recommendations for Other Services     Frequency Min 6X/week    Precautions / Restrictions Precautions Precautions: Back Precaution Booklet Issued: Yes (comment) Precaution Comments: sign in room Restrictions Weight Bearing Restrictions: No   Pertinent Vitals/Pain Min c/o pain      Mobility  Bed Mobility Bed Mobility: Right Sidelying to Sit;Rolling Right Rolling Right: 4: Min assist;With rail Right Sidelying to Sit: 4: Min assist;With rails;HOB flat Details for Bed Mobility Assistance: cues for technique and min A for trunk/legs due to getting caught on sheet Transfers Sit to Stand: 4: Min assist;From bed;With upper extremity assist Details for Transfer Assistance: cues for safety and hand placement Ambulation/Gait Ambulation/Gait Assistance: 4: Min assist Ambulation Distance (Feet): 140 Feet Assistive device: Rolling walker;1 person hand held assist Ambulation/Gait Assistance Details: cues for safety, use of RW/distance from self, posture Gait Pattern: Step-to pattern;Step-through pattern;Lateral trunk lean to right    Exercises     PT Diagnosis: Difficulty walking  PT Problem List: Decreased strength;Decreased range of motion;Decreased activity tolerance;Decreased  balance;Decreased mobility;Decreased knowledge of use of DME;Decreased safety awareness;Decreased knowledge of precautions PT Treatment Interventions: DME instruction;Gait training;Functional mobility training;Therapeutic activities;Therapeutic exercise;Patient/family education;Balance training   PT Goals Acute Rehab PT Goals PT Goal Formulation: With patient Time For Goal Achievement: 01/22/13 Potential to Achieve Goals: Good Pt will Roll Supine to Right Side: with supervision PT Goal: Rolling Supine to Right Side - Progress: Goal set today Pt will go Supine/Side to Sit: with supervision PT Goal: Supine/Side to Sit - Progress: Goal set today Pt will go Sit to Stand: with supervision PT Goal: Sit to Stand - Progress: Goal set today Pt will go Stand to Sit: with supervision PT Goal: Stand to Sit - Progress: Goal set today Pt will Ambulate: 51 - 150 feet;with least restrictive assistive device;with supervision PT Goal: Ambulate - Progress: Goal set today  Visit Information  Last PT Received On: 01/08/13 Assistance Needed: +1 PT/OT Co-Evaluation/Treatment: Yes    Subjective Data  Subjective: I  will give it a try Patient Stated Goal: home   Prior Functioning  Home Living Lives With: Alone Type of Home: House Home Access: Level entry Home Layout: One level Bathroom Shower/Tub: Engineer, manufacturing systems: Handicapped height Home Adaptive Equipment: None Prior Function Level of Independence: Independent Driving: Yes Communication Communication: No difficulties Dominant Hand: Right    Cognition  Cognition Arousal/Alertness: Awake/alert Behavior During Therapy: WFL for tasks assessed/performed Overall Cognitive Status: Within Functional Limits for tasks assessed    Extremity/Trunk Assessment Right Upper Extremity Assessment RUE ROM/Strength/Tone: [facility] for tasks assessed Left Upper Extremity Assessment LUE ROM/Strength/Tone: [facility] for tasks assessed Right Lower  Extremity Assessment RLE ROM/Strength/Tone: [facility] for tasks assessed Left Lower Extremity Assessment LLE ROM/Strength/Tone: [facility] for tasks  assessed;Deficits   Balance    End of Session PT - End of Session Equipment Utilized During Treatment: Gait belt Activity Tolerance: Patient tolerated treatment well Patient left: in chair;with call bell/phone within reach Nurse Communication: Mobility status  GP     [facility] 01/08/2013, 4:50 PM

## 2013-01-08 NOTE — Progress Notes (Signed)
Subjective: 1 Day Post-Op Procedure(s) (LRB): LUMBAR LAMINECTOMY CENTRAL DECOMPRESSION L4-L5, BILATERAL FORAMENOTOMY L4,L5     (N/A) Patient reports pain as mild and moderate.   Pain in the buttock area but does not have pain in the legs. Has not been up yet.  She lives alone and needs to be up and moving before going home.  Will start therapy and maybe tomorrow.  Objective: Vital signs in last 24 hours: Temp:  [97.4 F (36.3 C)-98.4 F (36.9 C)] 98.3 F (36.8 C) (05/10 0648) Pulse Rate:  [51-75] 65 (05/10 0648) Resp:  [13-18] 16 (05/10 0648) BP: (111-149)/(47-74) 130/70 mmHg (05/10 0648) SpO2:  [94 %-100 %] 100 % (05/10 0648) Weight:  [64.592 kg (142 lb 6.4 oz)] 64.592 kg (142 lb 6.4 oz) (05/09 1740)  Intake/Output from previous day: 05/09 0701 - 05/10 0700 In: 2700 [I.V.:2700] Out: 1650 [Urine:1650]  No results found for this basename: HGB,  in the last 72 hours No results found for this basename: WBC, RBC, HCT, PLT,  in the last 72 hours No results found for this basename: NA, K, CL, CO2, BUN, CREATININE, GLUCOSE, CALCIUM,  in the last 72 hours No results found for this basename: LABPT, INR,  in the last 72 hours  Exam Neurovascular intact Sensation intact distally Dorsiflexion/Plantar flexion intact  Assessment/Plan: 1 Day Post-Op Procedure(s) (LRB): LUMBAR LAMINECTOMY CENTRAL DECOMPRESSION L4-L5, BILATERAL FORAMENOTOMY L4,L5     (N/A) Up with therapy Plan for discharge tomorrow Discharge home with home health  Kathryn [name], [name] 01/08/2013, 7:30 AM

## 2013-01-08 NOTE — Op Note (Signed)
NAMEMarland Kitchen  Kathryn, [name] NO.:  [id number]  MEDICAL RECORD NO.:  [id number]  LOCATION:  1615                         FACILITY:  [facility]  PHYSICIAN:  Georges Lynch. Blanca Carreon, M.D.DATE OF BIRTH:  [birth year]  DATE OF PROCEDURE:  01/07/2013 DATE OF DISCHARGE:                              OPERATIVE REPORT   SURGEON:  Georges Lynch. Darrelyn Hillock, M.D.  OPERATIVE ASSISTANT:  Marlowe Kays, M.D.  PREOPERATIVE DIAGNOSES: 1. Spinal stenosis, L3-L4. 2. Spinal stenosis L4-5. 3. Pseudospondylolisthesis at L4-5.  POSTOPERATIVE DIAGNOSES: 1. Spinal stenosis, L3-L4. 2. Spinal stenosis L4-5. 3. Pseudospondylolisthesis at L4-5.  OPERATION: 1. Complete decompressive lumbar laminectomy at L4-5 and partial     hemilaminectomy at L3-4. 2. Foraminotomies for the L3 and the L4 roots bilaterally.  PROCEDURE:  Under general anesthesia, routine orthopedic prep and draping was carried out.  Appropriate time-out was carried out.  I also marked the appropriate left side because left side was bothering her the greatest of either side.  At this time, with the patient on a spinal frame and after the prep and draping; after 2 needles were placed in the back, x-ray was taken.  Following this, incision was made over the L3-4, L4-5 spaces.  At this particular point, we then went down did a complete decompressive lumbar laminectomy by separating the muscle from the lamina and spinous process bilaterally.  We then inserted the [facility] retractors.  We took another x-ray to identify the L4 and the L3 spinous process.  We then removed a portion of spinous process of L3 and in the entire process of L4.  We went down and did a complete decompressive lumbar laminectomy at this time.  The microscope was brought in.  We then carefully protected the underlying dura and we then completed our decompression.  We then removed the ligamentum flavum, it was severely thickened and compressed at the L4-5 space.  We then  continued to dissect proximally until the dura was totally free proximally and the foramina for the L3 to the L4 roots were free proximally.  We then proceeded laterally on both sides and recesses decompressed.  We went down distally and great care was taken to protect the dura which took an upward turn.  We then went up under the lamina distally until the dura was free.  We were able now to easily pass the hockey-stick out the foramina for the L4 root and L5 root bilaterally.  We were able to easily pass the hockey stick distally and proximally.  We continued our dissection distally and proximally until we had complete freedom of the dura.  We thoroughly irrigated out the area.  Following that before closing, we then had instruments in to check her spaces again.  At this time, I loosely applied some thrombin-soaked Gelfoam.  I closed the wound layers in usual fashion, but I left this small deep distal and proximal part of the wound open for drainage purposes.  Subcu was closed 2-0 Vicryl after I injected 20 mL of Exparel into the wound site.  I then closed the subcu and then the skin was closed with metal staples.  She had clindamycin preop, 900 mL.  ______________________________ Georges Lynch Darrelyn Hillock, M.D.     RAG/MEDQ  D:  01/07/2013  T:  01/08/2013  Job:  [id number]

## 2013-01-09 NOTE — Progress Notes (Signed)
Physical Therapy Treatment Patient Details Name: Kathryn [name] MRN: [id number] DOB: Jul 03, 1931 Today's Date: 01/09/2013 Time: 4098-1191 PT Time Calculation (min): 34 min  PT Assessment / Plan / Recommendation Comments on Treatment Session  POD # 2 L4 L5 Lumbar Laminectomy/decompression and B formenotomy.  Pt unable to recall any back percautions other than "no lifting" so re educated by using Teach Back method and given BAT handout.  Assisted pt with Log Roll OOB to amb twice.  Pt progressing slowly and plans to D/C to SNF.    Follow Up Recommendations  SNF     Does the patient have the potential to tolerate intense rehabilitation     Barriers to Discharge        Equipment Recommendations  Rolling walker with 5" wheels    Recommendations for Other Services    Frequency Min 6X/week   Plan Discharge plan remains appropriate    Precautions / Restrictions Precautions Precautions: Back Precaution Comments: Pt unable to recall any back percautions other than "no lifting" so re educated and made her a BAT sign Restrictions Weight Bearing Restrictions: No   Pertinent Vitals/Pain C/o 5/10 back pain with amb esp pain in L buttock repositioned    Mobility  Bed Mobility Bed Mobility: Right Sidelying to Sit;Rolling Right Rolling Right: 3: Mod assist Right Sidelying to Sit: 3: Mod assist Details for Bed Mobility Assistance: Pt required 100% VC's and mod assist to perform log roll correctly. Transfers Sit to Stand: 4: Min assist;From bed;With upper extremity assist Details for Transfer Assistance: 50% VC's on proper tech and hand placement. Ambulation/Gait Ambulation/Gait Assistance: 4: Min assist Ambulation Distance (Feet): 225 Feet (120' then 88' with one sitting rest break) Assistive device: Rolling walker Ambulation/Gait Assistance Details: 50% VC's on proper advancement of RW and tactile cueing to correct posture of L hip hike and lateral R lean. Unsteady gait with difficulty  correctly using RW (hx CVA) Gait Pattern: Step-to pattern;Step-through pattern;Lateral trunk lean to right Gait velocity: decreased     PT Goals                                                           Progressing     Visit Information  Last PT Received On: 01/09/13 Assistance Needed: +1    Subjective Data  Subjective: My left butt hurts   Cognition    impaired   Balance   poor  End of Session PT - End of Session Equipment Utilized During Treatment: Gait belt Activity Tolerance: Patient tolerated treatment well Patient left: in chair;with call bell/phone within reach;with family/visitor present Nurse Communication: Mobility status   Felecia Shelling  PTA [facility]  Acute  Rehab Pager      [phone]

## 2013-01-09 NOTE — Progress Notes (Signed)
   Subjective: 2 Days Post-Op Procedure(s) (LRB): LUMBAR LAMINECTOMY CENTRAL DECOMPRESSION L4-L5, BILATERAL FORAMENOTOMY L4,L5     (N/A)  Pt c/o continued pain to bilateral lower legs from the knees to her hips Pt having several episodes of tachycardia Chest CT negative for PE Pt not SOB during exam today, just c/o pain  Patient reports pain as moderate.  Objective:   VITALS:   Filed Vitals:   01/09/13 0550  BP: 133/73  Pulse: 68  Temp: 97.8 F (36.6 C)  Resp: 16    Lumbar incision healing well nv intact distally bilateral lower extremities  LABS  Recent Labs  01/08/13 1127  HGB 10.0*  HCT 29.8*  WBC 3.9*  PLT 135*     Recent Labs  01/08/13 1127  NA 138  K 3.3*  BUN 8  CREATININE 0.78  GLUCOSE 127*     Assessment/Plan: 2 Days Post-Op Procedure(s) (LRB): LUMBAR LAMINECTOMY CENTRAL DECOMPRESSION L4-L5, BILATERAL FORAMENOTOMY L4,L5     (N/A)  Pain control Recommend better pain control before d/c home Possible d/c tomorrow Up with therapy   Alphonsa Overall, MPAS, PA-C  01/09/2013, 7:10 AM

## 2013-01-09 NOTE — Progress Notes (Signed)
Clinical Social Work Department BRIEF PSYCHOSOCIAL ASSESSMENT 01/09/2013  Patient:  Kathryn [name], Kathryn [name]     Account Number:  [id number]     Admit date:  01/07/2013  Clinical Social Worker:  Doroteo Glassman  Date/Time:  01/09/2013 01:12 PM  Referred by:  Physician  Date Referred:  01/09/2013 Referred for  SNF Placement   Other Referral:   Interview type:  Patient Other interview type:    PSYCHOSOCIAL DATA Living Status:  ALONE Admitted from facility:   Level of care:   Primary support name:  Delorse Lek Primary support relationship to patient:  CHILD, ADULT Degree of support available:   adequate    CURRENT CONCERNS Current Concerns  Post-Acute Placement   Other Concerns:    SOCIAL WORK ASSESSMENT / PLAN Met with Pt to discuss d/c plans.    Pt stated that her MD recommended SNF and that, although she doesn't want to go to SNF, she understands it's not safe for her to return to her home alone.    Pt is interested in [place] but agreeable to having her information sent to all [employer].    CSW provided Pt with SNF list.    CSW thanked Pt for her time.   Assessment/plan status:  Psychosocial Support/Ongoing Assessment of Needs Other assessment/ plan:   Information/referral to community resources:   SNF list    PATIENT'S/FAMILY'S RESPONSE TO PLAN OF CARE: Pt thanked CSW for time and assistance.   Providence Crosby, LCSWA Clinical Social Work [phone]

## 2013-01-09 NOTE — Progress Notes (Signed)
Clinical Social Work Department CLINICAL SOCIAL WORK PLACEMENT NOTE 01/09/2013  Patient:  Kathryn [name], Kathryn [name]  Account Number:  [id number] Admit date:  01/07/2013  Clinical Social Worker:  Doroteo Glassman  Date/time:  01/09/2013 01:15 PM  Clinical Social Work is seeking post-discharge placement for this patient at the following level of care:   SKILLED NURSING   (*CSW will update this form in Epic as items are completed)   01/09/2013  Patient/family provided with [facility] Department of Clinical Social Work's list of facilities offering this level of care within the geographic area requested by the patient (or if unable, by the patient's family).  01/09/2013  Patient/family informed of their freedom to choose among providers that offer the needed level of care, that participate in Medicare, Medicaid or managed care program needed by the patient, have an available bed and are willing to accept the patient.  01/09/2013  Patient/family informed of [facility]' ownership interest in [facility], as well as of the fact that they are under no obligation to receive care at this facility.  PASARR submitted to EDS on 01/09/2013 PASARR number received from EDS on 01/09/2013  FL2 transmitted to all facilities in geographic area requested by pt/family on  01/09/2013 FL2 transmitted to all facilities within larger geographic area on   Patient informed that his/her managed care company has contracts with or will negotiate with  certain facilities, including the following:     Patient/family informed of bed offers received:   Patient chooses bed at  Physician recommends and patient chooses bed at    Patient to be transferred to  on   Patient to be transferred to facility by   The following physician request were entered in Epic:   Additional Comments:  Providence Crosby, Theresia Majors Clinical Social Work [phone]

## 2013-01-10 ENCOUNTER — Encounter (HOSPITAL_COMMUNITY): Payer: Self-pay | Admitting: Orthopedic Surgery

## 2013-01-10 DIAGNOSIS — D62 Acute posthemorrhagic anemia: Secondary | ICD-10-CM | POA: Diagnosis not present

## 2013-01-10 MED ORDER — MEPERIDINE HCL 50 MG PO TABS: 50.0000 mg | ORAL_TABLET | Freq: Four times a day (QID) | ORAL | Status: DC | PRN

## 2013-01-10 MED ORDER — TRAMADOL-ACETAMINOPHEN 37.5-325 MG PO TABS: 1.0000 | ORAL_TABLET | Freq: Four times a day (QID) | ORAL | Status: DC | PRN

## 2013-01-10 MED ORDER — METHOCARBAMOL 500 MG PO TABS: 500.0000 mg | ORAL_TABLET | Freq: Four times a day (QID) | ORAL | Status: DC | PRN

## 2013-01-10 NOTE — Discharge Summary (Signed)
Physician Discharge Summary  Patient ID: Kathryn [name] MRN: [id number] DOB/AGE: 1930-09-18 79 y.o.  Admit date: 01/07/2013 Discharge date: 01/10/2013  Admission Diagnoses:Spinal Stenosis at two Levels.  Discharge Diagnoses: Spinal Stenosis,Lumbar, at two levels, Active Problems:   Spinal stenosis, lumbar region, with neurogenic claudication   Acute blood loss anemia   Discharged Condition: Improved  Hospital Course: No post-Op Problems.  Consults: Child psychotherapist.  Significant Diagnostic Studies: radiology: Xray in OR  Treatments: antibiotics: Ancef  Discharge Exam: Blood pressure 130/73, pulse 70, temperature 98.7 F (37.1 C), temperature source Oral, resp. rate 16, height 5\' 3"  (1.6 m), weight 64.592 kg (142 lb 6.4 oz), SpO2 100.00%. Extremities: extremities normal, atraumatic, no cyanosis or edema  Disposition: 01-Home or Self Care  Discharge Orders   Future Appointments Provider Department Dept Phone   05/17/2013 2:00 PM Suanne Marker, MD [facility] [phone]   Future Orders Complete By Expires     Call MD / Call 911  As directed     Comments:      If you experience chest pain or shortness of breath, CALL 911 and be transported to the hospital emergency room.  If you develope a fever above 101 F, pus (white drainage) or increased drainage or redness at the wound, or calf pain, call your surgeon's office.    Constipation Prevention  As directed     Comments:      Drink plenty of fluids.  Prune juice may be helpful.  You may use a stool softener, such as Colace (over the counter) 100 mg twice a day.  Use MiraLax (over the counter) for constipation as needed.    Diet - low sodium heart healthy  As directed     Discharge instructions  As directed     Comments:      Change your dressing daily. Shower only, no tub bath. Call if any temperatures greater than 101 or any wound complications: [phone] during the day and ask for Dr. Jeannetta Ellis nurse, [facility] [place].    Driving restrictions  As directed     Comments:      No driving    Increase activity slowly as tolerated  As directed     Lifting restrictions  As directed     Comments:      No lifting        Medication List    TAKE these medications       ALIGN 4 MG Caps  Take 1 tablet by mouth daily.     amLODipine 2.5 MG tablet  Commonly known as:  NORVASC  Take 2.5 mg by mouth 2 (two) times daily.     aspirin EC 81 MG tablet  Take 81 mg by mouth daily.     BEPREVE 1.5 % Soln  Generic drug:  Bepotastine Besilate  Place 1 drop into both eyes daily as needed (for itchy eyes).     CALCIUM 500/D 500-400 MG-UNIT Chew  Generic drug:  Calcium Carb-Cholecalciferol  Chew 300 mg by mouth 2 (two) times daily.     cetirizine 10 MG tablet  Commonly known as:  ZYRTEC  Take 10 mg by mouth as needed for allergies.     clonazePAM 1 MG tablet  Commonly known as:  KLONOPIN  Take 0.5-1 mg by mouth at bedtime as needed for anxiety.     cyanocobalamin 100 MCG tablet  Take 100 mcg by mouth daily.     docusate sodium 100 MG capsule  Commonly known  as:  COLACE  Take 100 mg by mouth daily.     estradiol 0.0375 MG/24HR  Commonly known as:  VIVELLE-DOT  Place 1 patch onto the skin 2 (two) times a week.     fluticasone 50 MCG/ACT nasal spray  Commonly known as:  FLONASE  Place 2 sprays into the nose daily as needed. For allergies/congestion     hydrocortisone 25 MG suppository  Commonly known as:  ANUSOL-HC  Place 25 mg rectally at bedtime. Use 1 supp rectally at bedtime for 10 nights.     hydrOXYzine 25 MG tablet  Commonly known as:  ATARAX/VISTARIL  Take 25 mg by mouth 3 (three) times daily as needed. For itching     hyoscyamine 0.125 MG SL tablet  Commonly known as:  LEVSIN SL  Take 0.125 mg by mouth every 8 (eight) hours as needed. For colon spasms     losartan 100 MG tablet  Commonly known as:  COZAAR  Take 100 mg by mouth every morning.     meperidine 50 MG tablet   Commonly known as:  DEMEROL  Take 1 tablet (50 mg total) by mouth every 6 (six) hours as needed for pain.     methocarbamol 500 MG tablet  Commonly known as:  ROBAXIN  Take 1 tablet (500 mg total) by mouth every 6 (six) hours as needed.     multivitamin capsule  Take 1 capsule by mouth daily.     NON FORMULARY  Weekly allergy shots with dr [place]     pantoprazole 40 MG tablet  Commonly known as:  PROTONIX  Take 40 mg by mouth every morning.     propranolol 40 MG tablet  Commonly known as:  INDERAL  Take 40 mg by mouth 2 (two) times daily.     simvastatin 40 MG tablet  Commonly known as:  ZOCOR  Take 40 mg by mouth every morning.     traMADol-acetaminophen 37.5-325 MG per tablet  Commonly known as:  ULTRACET  Take 1 tablet by mouth every 6 (six) hours as needed for pain.     VALIUM 2 MG tablet  Generic drug:  diazepam  Take 2 mg by mouth every 6 (six) hours as needed. For anxiety     vitamin E 100 UNIT capsule  Take 100 Units by mouth daily.     zolpidem 12.5 MG CR tablet  Commonly known as:  AMBIEN CR  Take 12.5 mg by mouth at bedtime.           Follow-up Information   Follow up with Cristie Mckinney A, MD. Schedule an appointment as soon as possible for a visit in 2 weeks.   Contact information:   [address] [place] [address] [phone]       Signed: Jacki Cones 01/10/2013, 7:12 AM

## 2013-01-10 NOTE — Progress Notes (Addendum)
Occupational Therapy Treatment Patient Details Name: Kathryn [name] MRN: [id number] DOB: 06/07/1931 Today's Date: 01/10/2013 Time: 4098-1191 OT Time Calculation (min): 31 min  OT Assessment / Plan / Recommendation Comments on Treatment Session Pt tolerated session well. Needs frequent verbal and demo cues for adhering to back precautions. Planning SNF.     Follow Up Recommendations  SNF;Supervision/Assistance - 24 hour    Barriers to Discharge       Equipment Recommendations  3 in 1 bedside comode    Recommendations for Other Services    Frequency Min 2X/week   Plan Discharge plan remains appropriate    Precautions / Restrictions Precautions Precautions: Back Precaution Comments: Pt able to recall 2/3 precautions with min ?cues for the second. Reviewed all precautions with pt Restrictions Weight Bearing Restrictions: No       ADL  Toilet Transfer: Performed;Minimal assistance Toilet Transfer Method: Other (comment) (hand held assist. leans to R) Toilet Transfer Equipment: Comfort height toilet;Grab bars Toileting - Clothing Manipulation and Hygiene: Performed;Minimal assistance Where Assessed - Toileting Clothing Manipulation and Hygiene: Sit to stand from 3-in-1 or toilet ADL Comments: Pt declines using RW. States it "gets in her way" but pt unsteady and tends to lean to R without it. Pt states she has trouble with buttons at home. Showed pt button hook and she states she already has one but doesnt feel it works well for her. Pt needs frequent cues to adhere to back precautions. At the sink she tends to twist to reach for the paper towel and also tends to lean forward to reach for clothing to pull up for clothing manipulation at the toilet. Cues to stand straight and turn to face paper towel holder and to pull up underpants higher as she sits so that she doesnt have to reach and bend when she stands. Will benefit from SNF to reinforce back precautions for OT. Pt has a high toilet at  home but needs UE support to help stand and sit on the commode. She used grab bar and therapist hand held support to sit on and stand from toilet.     OT Diagnosis:    OT Problem List:   OT Treatment Interventions:     OT Goals ADL Goals ADL Goal: Toilet Transfer - Progress: Progressing toward goals ADL Goal: Toileting - Hygiene - Progress: Progressing toward goals  Visit Information  Last OT Received On: 01/10/13 Assistance Needed: +1    Subjective Data  Subjective: I want to brush my teeth Patient Stated Goal: get back home after rehab   Prior Functioning       Cognition  Cognition Arousal/Alertness: Awake/alert Behavior During Therapy: WFL for tasks assessed/performed    Mobility  Bed Mobility Bed Mobility: Rolling Left;Left Sidelying to Sit Rolling Left: 4: Min assist Left Sidelying to Sit: 4: Min assist Transfers Transfers: Sit to Stand;Stand to Sit Sit to Stand: 4: Min assist;With upper extremity assist;From bed;From toilet Stand to Sit: 4: Min assist;With upper extremity assist;To toilet;To chair/3-in-1 Details for Transfer Assistance: verbal cues for posture and hand placement    Exercises      Balance Balance Balance Assessed: Yes Dynamic Standing Balance Dynamic Standing - Level of Assistance: 4: Min assist   End of Session OT - End of Session Activity Tolerance: Patient tolerated treatment well Patient left: in chair;with call bell/phone within reach  GO     Kathryn [name] [phone] 01/10/2013, 9:44 AM

## 2013-01-10 NOTE — Progress Notes (Signed)
Clinical Social Work Department CLINICAL SOCIAL WORK PLACEMENT NOTE 01/10/2013  Patient:  Kathryn [name], Kathryn [name]  Account Number:  [id number] Admit date:  01/07/2013  Clinical Social Worker:  Doroteo Glassman  Date/time:  01/09/2013 01:15 PM  Clinical Social Work is seeking post-discharge placement for this patient at the following level of care:   SKILLED NURSING   (*CSW will update this form in Epic as items are completed)   01/09/2013  Patient/family provided with [facility] Department of Clinical Social Work's list of facilities offering this level of care within the geographic area requested by the patient (or if unable, by the patient's family).  01/09/2013  Patient/family informed of their freedom to choose among providers that offer the needed level of care, that participate in Medicare, Medicaid or managed care program needed by the patient, have an available bed and are willing to accept the patient.  01/09/2013  Patient/family informed of [facility]' ownership interest in [facility], as well as of the fact that they are under no obligation to receive care at this facility.  PASARR submitted to EDS on 01/09/2013 PASARR number received from EDS on 01/09/2013  FL2 transmitted to all facilities in geographic area requested by pt/family on  01/09/2013 FL2 transmitted to all facilities within larger geographic area on   Patient informed that his/her managed care company has contracts with or will negotiate with  certain facilities, including the following:     Patient/family informed of bed offers received:  01/10/2013 Patient chooses bed at [facility] Physician recommends and patient chooses bed at    Patient to be transferred to [facility] on  01/10/2013 Patient to be transferred to facility by [facility]  The following physician request were entered in Epic:   Additional Comments:  Cori Razor LCSW [phone]

## 2013-01-10 NOTE — Progress Notes (Signed)
Subjective: 3 Days Post-Op Procedure(s) (LRB): LUMBAR LAMINECTOMY CENTRAL DECOMPRESSION L4-L5, BILATERAL FORAMENOTOMY L4,L5     (N/A) Patient reports pain as 1 on 0-10 scale. Doing well this A.M. She has Acute Blood Loss Anemia but is Stable. Awaiting Transfer to SNF.  Objective: Vital signs in last 24 hours: Temp:  [97.9 F (36.6 C)-98.7 F (37.1 C)] 98.7 F (37.1 C) (05/12 0522) Pulse Rate:  [67-70] 70 (05/12 0522) Resp:  [16] 16 (05/12 0522) BP: (97-130)/(62-73) 130/73 mmHg (05/12 0522) SpO2:  [100 %] 100 % (05/12 0522)  Intake/Output from previous day: 05/11 0701 - 05/12 0700 In: 1382.5 [P.O.:900; I.V.:482.5] Out: 1550 [Urine:1550] Intake/Output this shift:     Recent Labs  01/08/13 1127  HGB 10.0*    Recent Labs  01/08/13 1127  WBC 3.9*  RBC 3.35*  HCT 29.8*  PLT 135*    Recent Labs  01/08/13 1127  NA 138  K 3.3*  CL 101  CO2 33*  BUN 8  CREATININE 0.78  GLUCOSE 127*  CALCIUM 9.0   No results found for this basename: LABPT, INR,  in the last 72 hours  Neurologically intact Dorsiflexion/Plantar flexion intact  Assessment/Plan: 3 Days Post-Op Procedure(s) (LRB): LUMBAR LAMINECTOMY CENTRAL DECOMPRESSION L4-L5, BILATERAL FORAMENOTOMY L4,L5     (N/A) Discharge to SNF  Rhemi Balbach A 01/10/2013, 7:10 AM

## 2013-01-11 ENCOUNTER — Non-Acute Institutional Stay (SKILLED_NURSING_FACILITY): Payer: Medicare Other | Admitting: Internal Medicine

## 2013-01-11 DIAGNOSIS — D62 Acute posthemorrhagic anemia: Secondary | ICD-10-CM

## 2013-01-11 DIAGNOSIS — I10 Essential (primary) hypertension: Secondary | ICD-10-CM

## 2013-01-11 DIAGNOSIS — E039 Hypothyroidism, unspecified: Secondary | ICD-10-CM

## 2013-01-11 DIAGNOSIS — M48062 Spinal stenosis, lumbar region with neurogenic claudication: Secondary | ICD-10-CM

## 2013-01-17 ENCOUNTER — Encounter: Payer: Self-pay | Admitting: Adult Health

## 2013-01-17 ENCOUNTER — Non-Acute Institutional Stay (SKILLED_NURSING_FACILITY): Payer: Medicare Other | Admitting: Adult Health

## 2013-01-17 DIAGNOSIS — M48062 Spinal stenosis, lumbar region with neurogenic claudication: Secondary | ICD-10-CM

## 2013-01-17 DIAGNOSIS — G4700 Insomnia, unspecified: Secondary | ICD-10-CM

## 2013-01-17 DIAGNOSIS — F411 Generalized anxiety disorder: Secondary | ICD-10-CM

## 2013-01-17 DIAGNOSIS — E785 Hyperlipidemia, unspecified: Secondary | ICD-10-CM

## 2013-01-17 DIAGNOSIS — I10 Essential (primary) hypertension: Secondary | ICD-10-CM

## 2013-01-17 DIAGNOSIS — K219 Gastro-esophageal reflux disease without esophagitis: Secondary | ICD-10-CM

## 2013-01-17 DIAGNOSIS — D62 Acute posthemorrhagic anemia: Secondary | ICD-10-CM

## 2013-01-17 NOTE — Progress Notes (Signed)
  Subjective:    Patient ID: Kathryn [name], female    DOB: 06-29-1931, 79 y.o.   MRN: [id number]  HPI  This is an 79 year old female who is for discharge home with Home health PT and Nursing. She has been admitted to [facility] on 01/10/13 from [facility] with Spinal stenosis, Lumbar at 2 levels S/P Lumbar laminectomy central decompression. She has been admitted for a short-term rehabilitation. Patient complaining of being anxious at bedtime and requesting to be on Klonopin which she has been taking for years.   Review of Systems  Constitutional: Negative.   HENT: Negative.   Eyes: Negative.   Respiratory: Negative for cough and shortness of breath.   Cardiovascular: Negative for leg swelling.  Gastrointestinal: Negative.   Endocrine: Negative.   Genitourinary: Negative.   Neurological: Negative.   Hematological: Negative for adenopathy. Does not bruise/bleed easily.  Psychiatric/Behavioral: The patient is nervous/anxious.        Objective:   Physical Exam  Constitutional: She is oriented to person, place, and time. She appears well-developed and well-nourished.  HENT:  Head: Normocephalic and atraumatic.  Right Ear: External ear normal.  Left Ear: External ear normal.  Eyes: Conjunctivae are normal. Pupils are equal, round, and reactive to light.  Neck: Neck supple.  Cardiovascular: Normal rate, regular rhythm and normal heart sounds.   Pulmonary/Chest: Effort normal and breath sounds normal.  Abdominal: Soft. Bowel sounds are normal. She exhibits no distension.  Musculoskeletal: Normal range of motion. She exhibits no edema and no tenderness.  Neurological: She is alert and oriented to person, place, and time.  Skin: Skin is warm and dry.  Psychiatric: She has a normal mood and affect. Her behavior is normal. Judgment and thought content normal.   LABS: 5/14  Iron 33 vitB12 855  Folate >20.0  Medications reviewed per [facility]     Assessment & Plan:   Generalized  anxiety disorder - start Klonopin 1 mg 1 tab PO Q HS x 1 tonight then Klonopin 0.5 mg PO Q HS PRN   GERD (gastroesophageal reflux disease) - stable; continue Protonix  Other and unspecified hyperlipidemia - continue Zocor  Insomnia - no complaints of; continue Ambien  Acute blood loss anemia - stable; continue FeSO4  Spinal stenosis, lumbar region, with neurogenic claudication S/PLumbar laminectomy central decompression - for Home health PT and Nursing   HYPERTENSION - well-controlled; continue Inderal, Losartan and Amlodipine

## 2013-02-01 DIAGNOSIS — I10 Essential (primary) hypertension: Secondary | ICD-10-CM | POA: Insufficient documentation

## 2013-02-01 DIAGNOSIS — E039 Hypothyroidism, unspecified: Secondary | ICD-10-CM | POA: Insufficient documentation

## 2013-02-01 NOTE — Progress Notes (Signed)
Patient ID: Kathryn [name], female   DOB: 26-Mar-1931, 79 y.o.   MRN: [id number]        HISTORY & PHYSICAL  DATE: 01/11/2013   FACILITY: [facility]  LEVEL OF CARE: SNF (31)  ALLERGIES:  Allergies  Allergen Reactions  . Augmentin (Amoxicillin-Pot Clavulanate) Swelling    Throat   . Citalopram Swelling    Throat and eyes   . Codeine Anaphylaxis  . Ibuprofen Swelling    Throat and eyes   . Latex Swelling and Rash    Swelling to troat  . Lipitor (Atorvastatin) Swelling    throat  . Septra (Bactrim) Anaphylaxis  . Vicodin (Hydrocodone-Acetaminophen) Other (See Comments)    stroke  . Ciprofloxacin Nausea And Vomiting  . Depakene (Valproate Sodium) Hives    All over body   . Isoptin Sr (Verapamil Hcl Er) Cough  . Lisinopril Cough  . Miconazole Rash    CHIEF COMPLAINT:  Manage lumbar spinal stenosis, acute blood loss anemia, and hypothyroidism.    HISTORY OF PRESENT ILLNESS:  The patient is an 79 year-old, Caucasian female.    SPINAL STENOSIS: Patient was having lumbar spinal stenosis with neurogenic claudication.  Therefore, she underwent lumbar laminectomy with central decompression at L4-5, bilateral foraminotomy at L4-5.  She tolerated the procedure well and is admitted to this facility for short-term rehabilitation.   Patient's spinal stenoses remains stable. Patient denies ongoing low back pain, numbness, tingling or weakness. No complications reported from the medications currently being used.   ANEMIA: Postoperatively, patient suffered acute blood loss.  The anemia has been stable. The patient denies fatigue, melena or hematochezia.   She is currently not on iron.    HYPOTHYROIDISM: The hypothyroidism remains stable. No complications noted from the medications presently being used.  The patient denies fatigue or constipation.  Last TSH:  A recent TSH is not available.   PAST MEDICAL HISTORY :  Past Medical History  Diagnosis Date  . Depression   .  Hypertension   . High cholesterol   . Rectal prolapse   . Hx of adenomatous colonic polyps   . Internal hemorrhoids   . Hypothyroidism   . Fecal incontinence   . TIA (transient ischemic attack)   . Stroke , sept 1, 2010sept 12, 2011    x 2  . History of frequent urinary tract infections     recent  . Migraine     PAST SURGICAL HISTORY: Past Surgical History  Procedure Laterality Date  . Colon surgery  2010, for prolapsed organs after hystecrtomy surgery  . Appendectomy  2008  . Abdominal hysterectomy  2010  . Cholecystectomy    . Lumbar laminectomy/decompression microdiscectomy N/A 01/07/2013    Procedure: LUMBAR LAMINECTOMY CENTRAL DECOMPRESSION L4-L5, BILATERAL FORAMENOTOMY L4,L5    ;  Surgeon: Jacki Cones, MD;  Location: [facility];  Service: Orthopedics;  Laterality: N/A;    SOCIAL HISTORY:  reports that she has never smoked. She has never used smokeless tobacco. She reports that she does not drink alcohol or use illicit drugs.  FAMILY HISTORY:  Family History  Problem Relation Age of Onset  . Stroke Father   . Hypertension Maternal Aunt     x3  . Colon cancer Neg Hx   . Migraines Father     CURRENT MEDICATIONS: Reviewed per [facility]  REVIEW OF SYSTEMS:  See HPI otherwise 14 point ROS is negative.  PHYSICAL EXAMINATION  VS:  T 98.7  P 70      RR 16      BP 130/73      POX 100% room air        WT (Lb) 142  GENERAL: no acute distress, normal body habitus EYES: conjunctivae normal, sclerae normal, normal eye lids MOUTH/THROAT: lips without lesions,no lesions in the mouth,tongue is without lesions,uvula elevates in midline NECK: supple, trachea midline, no neck masses, no thyroid tenderness, no thyromegaly LYMPHATICS: no LAN in the neck, no supraclavicular LAN RESPIRATORY: breathing is even & unlabored, BS CTAB CARDIAC: RRR, no murmur,no extra heart sounds, no edema GI:  ABDOMEN: abdomen soft, normal BS, no masses, no tenderness  LIVER/SPLEEN: no hepatomegaly,  no splenomegaly MUSCULOSKELETAL: HEAD: normal to inspection & palpation BACK: no kyphosis, scoliosis or spinal processes tenderness EXTREMITIES: LEFT UPPER EXTREMITY: full range of motion, normal strength & tone RIGHT UPPER EXTREMITY:  full range of motion, normal strength & tone LEFT LOWER EXTREMITY: strength intact, range of motion moderate  RIGHT LOWER EXTREMITY: strength intact, range of motion moderate  PSYCHIATRIC: the patient is alert & oriented to person, affect & behavior appropriate  LABS/RADIOLOGY: Hemoglobin 10, white count 3.9, platelets 135.    Potassium 3.3, glucose 127, otherwise BMP normal.   ASSESSMENT/PLAN:  Lumbar spinal stenosis with neurogenic claudication.  Status post surgery.  Continue rehabilitation.   Acute blood loss anemia.  Reassess hemoglobin level.   Hypothyroidism.   Currently off of levothyroxine.    Hypertension.  Well controlled.   GERD.  Well controlled.    Check CBC and BMP.   I have reviewed patient's medical records received at admission/from hospitalization.  CPT CODE: 62130

## 2013-02-09 ENCOUNTER — Telehealth: Payer: Self-pay | Admitting: Internal Medicine

## 2013-02-09 NOTE — Telephone Encounter (Signed)
Pt states she recently had back surgery and she is home now. Pt states she is having bad abdominal spasms, nausea and no appetite. Pt states she is taking the Levsin but it is not helping with the spasms. Dr. Juanda Chance please advise.

## 2013-02-09 NOTE — Telephone Encounter (Signed)
Please send GI cocktail, I have prescription in [facility] or if you have one , you may use it  Disp.. 120cc, 1 tablespoon q4-6 hrs prn cramps

## 2013-02-10 ENCOUNTER — Other Ambulatory Visit: Payer: Self-pay | Admitting: *Deleted

## 2013-02-10 ENCOUNTER — Telehealth: Payer: Self-pay | Admitting: *Deleted

## 2013-02-10 MED ORDER — AMBULATORY NON FORMULARY MEDICATION: Status: DC

## 2013-02-10 NOTE — Telephone Encounter (Signed)
Patient notified of Dr. Brodie's recommendations. 

## 2013-02-10 NOTE — Telephone Encounter (Signed)
Rx sent to pharmacy. Patient given Dr. Regino Schultze recommendation.

## 2013-03-10 ENCOUNTER — Other Ambulatory Visit: Payer: Self-pay | Admitting: Internal Medicine

## 2013-03-20 ENCOUNTER — Encounter (HOSPITAL_COMMUNITY): Payer: Self-pay

## 2013-03-20 ENCOUNTER — Emergency Department (HOSPITAL_COMMUNITY): Payer: Medicare Other

## 2013-03-20 ENCOUNTER — Emergency Department (HOSPITAL_COMMUNITY)
Admission: EM | Admit: 2013-03-20 | Discharge: 2013-03-20 | Disposition: A | Discharge status: Home / Self Care | Payer: Medicare Other | Attending: Emergency Medicine | Admitting: Emergency Medicine

## 2013-03-20 DIAGNOSIS — K760 Fatty (change of) liver, not elsewhere classified: Secondary | ICD-10-CM

## 2013-03-20 DIAGNOSIS — F329 Major depressive disorder, single episode, unspecified: Secondary | ICD-10-CM | POA: Insufficient documentation

## 2013-03-20 DIAGNOSIS — Z79899 Other long term (current) drug therapy: Secondary | ICD-10-CM | POA: Insufficient documentation

## 2013-03-20 DIAGNOSIS — Z8673 Personal history of transient ischemic attack (TIA), and cerebral infarction without residual deficits: Secondary | ICD-10-CM | POA: Insufficient documentation

## 2013-03-20 DIAGNOSIS — Z9089 Acquired absence of other organs: Secondary | ICD-10-CM | POA: Insufficient documentation

## 2013-03-20 DIAGNOSIS — Z87440 Personal history of urinary (tract) infections: Secondary | ICD-10-CM | POA: Insufficient documentation

## 2013-03-20 DIAGNOSIS — R109 Unspecified abdominal pain: Secondary | ICD-10-CM | POA: Insufficient documentation

## 2013-03-20 DIAGNOSIS — Z90710 Acquired absence of both cervix and uterus: Secondary | ICD-10-CM | POA: Insufficient documentation

## 2013-03-20 DIAGNOSIS — E7800 Pure hypercholesterolemia, unspecified: Secondary | ICD-10-CM | POA: Insufficient documentation

## 2013-03-20 DIAGNOSIS — Z9889 Other specified postprocedural states: Secondary | ICD-10-CM | POA: Insufficient documentation

## 2013-03-20 DIAGNOSIS — Z8639 Personal history of other endocrine, nutritional and metabolic disease: Secondary | ICD-10-CM | POA: Insufficient documentation

## 2013-03-20 DIAGNOSIS — Z860100 Personal history of colon polyps, unspecified: Secondary | ICD-10-CM | POA: Insufficient documentation

## 2013-03-20 DIAGNOSIS — I10 Essential (primary) hypertension: Secondary | ICD-10-CM | POA: Insufficient documentation

## 2013-03-20 DIAGNOSIS — F3289 Other specified depressive episodes: Secondary | ICD-10-CM | POA: Insufficient documentation

## 2013-03-20 DIAGNOSIS — Z91040 Latex allergy status: Secondary | ICD-10-CM | POA: Insufficient documentation

## 2013-03-20 DIAGNOSIS — Z7982 Long term (current) use of aspirin: Secondary | ICD-10-CM | POA: Insufficient documentation

## 2013-03-20 DIAGNOSIS — Z862 Personal history of diseases of the blood and blood-forming organs and certain disorders involving the immune mechanism: Secondary | ICD-10-CM | POA: Insufficient documentation

## 2013-03-20 DIAGNOSIS — R197 Diarrhea, unspecified: Secondary | ICD-10-CM | POA: Insufficient documentation

## 2013-03-20 DIAGNOSIS — Z8679 Personal history of other diseases of the circulatory system: Secondary | ICD-10-CM | POA: Insufficient documentation

## 2013-03-20 DIAGNOSIS — Z8719 Personal history of other diseases of the digestive system: Secondary | ICD-10-CM | POA: Insufficient documentation

## 2013-03-20 HISTORY — DX: Fatty (change of) liver, not elsewhere classified: K76.0

## 2013-03-20 LAB — URINALYSIS, ROUTINE W REFLEX MICROSCOPIC
Bilirubin Urine: NEGATIVE
Glucose, UA: NEGATIVE mg/dL
Hgb urine dipstick: NEGATIVE
Specific Gravity, Urine: 1.013 (ref 1.005–1.030)
Urobilinogen, UA: 0.2 mg/dL (ref 0.0–1.0)

## 2013-03-20 LAB — CBC WITH DIFFERENTIAL/PLATELET
Basophils Absolute: 0 10*3/uL (ref 0.0–0.1)
Basophils Relative: 0 % (ref 0–1)
Lymphocytes Relative: 25 % (ref 12–46)
Neutro Abs: 3.1 10*3/uL (ref 1.7–7.7)
Neutrophils Relative %: 60 % (ref 43–77)
Platelets: 187 10*3/uL (ref 150–400)
RDW: 12.4 % (ref 11.5–15.5)
WBC: 5.2 10*3/uL (ref 4.0–10.5)

## 2013-03-20 LAB — COMPREHENSIVE METABOLIC PANEL
ALT: 10 U/L (ref 0–35)
AST: 18 U/L (ref 0–37)
Albumin: 4 g/dL (ref 3.5–5.2)
CO2: 30 mEq/L (ref 19–32)
Chloride: 96 mEq/L (ref 96–112)
GFR calc non Af Amer: 58 mL/min — ABNORMAL LOW (ref >90)
Sodium: 135 mEq/L (ref 135–145)
Total Bilirubin: 0.5 mg/dL (ref 0.3–1.2)

## 2013-03-20 MED ORDER — MEPERIDINE HCL 50 MG PO TABS: 25.0000 mg | ORAL_TABLET | ORAL | Status: DC | PRN

## 2013-03-20 MED ORDER — FENTANYL CITRATE 0.05 MG/ML IJ SOLN
50.0000 ug | Freq: Once | INTRAMUSCULAR | Status: AC
  Administered 2013-03-20: 50 ug via INTRAVENOUS
  Filled 2013-03-20: qty 2

## 2013-03-20 MED ORDER — IOHEXOL 300 MG/ML  SOLN
50.0000 mL | Freq: Once | INTRAMUSCULAR | Status: AC | PRN
  Administered 2013-03-20: 50 mL via ORAL

## 2013-03-20 MED ORDER — ONDANSETRON HCL 4 MG/2ML IJ SOLN
4.0000 mg | Freq: Once | INTRAMUSCULAR | Status: AC
  Administered 2013-03-20: 4 mg via INTRAVENOUS
  Filled 2013-03-20: qty 2

## 2013-03-20 MED ORDER — IOHEXOL 300 MG/ML  SOLN
100.0000 mL | Freq: Once | INTRAMUSCULAR | Status: AC | PRN
  Administered 2013-03-20: 100 mL via INTRAVENOUS

## 2013-03-20 NOTE — ED Notes (Signed)
Patient still taking antibiotics for a UTI.

## 2013-03-20 NOTE — ED Notes (Signed)
Patient unsure if she is taking antibiotic now

## 2013-03-20 NOTE — ED Notes (Signed)
Patient is alert and oriented x3.  She was given DC instructions and follow up visit instructions.  Patient gave verbal understanding. She was DC ambulatory under her own power to home.  V/S stable.  He was not showing any signs of distress on DC 

## 2013-03-20 NOTE — ED Notes (Signed)
Pt returned from CT °

## 2013-03-20 NOTE — ED Provider Notes (Signed)
History    CSN: [id number] Arrival date & time 03/20/13  1642  First MD Initiated Contact with Patient 03/20/13 1649     Chief Complaint  Patient presents with  . Abdominal Pain    HPI She c/o low abd. Pain and "spasms" x ~ 2 weeks. Seen at her [facility] PCP office today; she is in no distress.  Patient denies fever chills.  Has had some loose stools. Try different medications for pain.  Has history of bladder spasms.  Is taking medication for bladder spasms.  Past Medical History  Diagnosis Date  . Depression   . Hypertension   . High cholesterol   . Rectal prolapse   . Hx of adenomatous colonic polyps   . Internal hemorrhoids   . Hypothyroidism   . Fecal incontinence   . TIA (transient ischemic attack)   . Stroke , sept 1, 2010sept 12, 2011    x 2  . History of frequent urinary tract infections     recent  . Migraine    Past Surgical History  Procedure Laterality Date  . Colon surgery  2010, for prolapsed organs after hystecrtomy surgery  . Appendectomy  2008  . Abdominal hysterectomy  2010  . Cholecystectomy    . Lumbar laminectomy/decompression microdiscectomy N/A 01/07/2013    Procedure: LUMBAR LAMINECTOMY CENTRAL DECOMPRESSION L4-L5, BILATERAL FORAMENOTOMY L4,L5    ;  Surgeon: Jacki Cones, MD;  Location: [facility];  Service: Orthopedics;  Laterality: N/A;   Family History  Problem Relation Age of Onset  . Stroke Father   . Hypertension Maternal Aunt     x3  . Colon cancer Neg Hx   . Migraines Father    History  Substance Use Topics  . Smoking status: Never Smoker   . Smokeless tobacco: Never Used  . Alcohol Use: No   OB History   Grav Para Term Preterm Abortions TAB SAB Ect Mult Living                 Review of Systems All other systems reviewed and are negative Allergies  Augmentin; Citalopram; Codeine; Ibuprofen; Latex; Lipitor; Septra; Vicodin; Ciprofloxacin; Depakene; Isoptin sr; Lisinopril; and Miconazole  Home Medications   Current  Outpatient Rx  Name  Route  Sig  Dispense  Refill  . amLODipine (NORVASC) 2.5 MG tablet   Oral   Take 2.5 mg by mouth 2 (two) times daily.          Marland Kitchen aspirin EC 81 MG tablet   Oral   Take 81 mg by mouth daily.         . Bepotastine Besilate (BEPREVE) 1.5 % SOLN   Both Eyes   Place 1 drop into both eyes daily as needed (for itchy eyes).         . Calcium Carb-Cholecalciferol (CALCIUM 500/D) 500-400 MG-UNIT CHEW   Oral   Chew 1 tablet by mouth 2 (two) times daily.          . cholecalciferol (VITAMIN D) 400 UNITS TABS   Oral   Take 400 Units by mouth daily.         . clonazePAM (KLONOPIN) 1 MG tablet   Oral   Take 0.5-1 mg by mouth at bedtime as needed for anxiety.          . diazepam (VALIUM) 2 MG tablet   Oral   Take 2 mg by mouth every 6 (six) hours as needed. For anxiety         .  docusate sodium (COLACE) 100 MG capsule   Oral   Take 100 mg by mouth daily.         Marland Kitchen estradiol (VIVELLE-DOT) 0.0375 MG/24HR   Transdermal   Place 1 patch onto the skin 2 (two) times a week.         . fluticasone (FLONASE) 50 MCG/ACT nasal spray   Nasal   Place 2 sprays into the nose daily. For allergies/congestion         . hydrocortisone (ANUSOL-HC) 25 MG suppository   Rectal   Place 25 mg rectally at bedtime.          . hydrOXYzine (ATARAX/VISTARIL) 25 MG tablet   Oral   Take 25 mg by mouth 3 (three) times daily as needed. For itching         . hyoscyamine (LEVSIN SL) 0.125 MG SL tablet   Oral   Take 0.125 mg by mouth every 8 (eight) hours as needed. For colon spasms         . losartan (COZAAR) 100 MG tablet   Oral   Take 100 mg by mouth every morning.         . Multiple Vitamin (MULTIVITAMIN) capsule   Oral   Take 1 capsule by mouth daily.         . nitrofurantoin (MACRODANTIN) 100 MG capsule   Oral   Take 100 mg by mouth 2 (two) times daily.         . NON FORMULARY      Weekly allergy shots with dr [place] Callas         . pantoprazole  (PROTONIX) 40 MG tablet   Oral   Take 40 mg by mouth every morning.         . Probiotic Product (ALIGN) 4 MG CAPS   Oral   Take 1 tablet by mouth daily.         . simvastatin (ZOCOR) 40 MG tablet   Oral   Take 40 mg by mouth every morning.          . traMADol-acetaminophen (ULTRACET) 37.5-325 MG per tablet   Oral   Take 1 tablet by mouth every 6 (six) hours as needed for pain.   60 tablet   0   . vitamin B-12 (CYANOCOBALAMIN) 1000 MCG tablet   Oral   Take 1,000 mcg by mouth daily.         . vitamin E 200 UNIT capsule   Oral   Take 200 Units by mouth daily.         Marland Kitchen zolpidem (AMBIEN CR) 12.5 MG CR tablet   Oral   Take 12.5 mg by mouth at bedtime.         . meperidine (DEMEROL) 50 MG tablet   Oral   Take 0.5 tablets (25 mg total) by mouth every 4 (four) hours as needed for pain.   15 tablet   0    BP 133/60  Pulse 59  Temp(Src) 98.6 F (37 C) (Oral)  Resp 20  SpO2 98% Physical Exam  Nursing note and vitals reviewed. Constitutional: She is oriented to person, place, and time. She appears well-developed and well-nourished. No distress.  HENT:  Head: Normocephalic and atraumatic.  Eyes: Pupils are equal, round, and reactive to light.  Neck: Normal range of motion.  Cardiovascular: Normal rate and intact distal pulses.   Pulmonary/Chest: No respiratory distress.  Abdominal: Normal appearance. She exhibits no distension. There is no tenderness. There is no  rebound and no guarding.  Musculoskeletal: Normal range of motion.  Neurological: She is alert and oriented to person, place, and time. No cranial nerve deficit.  Skin: Skin is warm and dry. No rash noted.  Psychiatric: She has a normal mood and affect. Her behavior is normal.    ED Course  Procedures (including critical care time) Medications  fentaNYL (SUBLIMAZE) injection 50 mcg (not administered)  ondansetron (ZOFRAN) injection 4 mg (not administered)  iohexol (OMNIPAQUE) 300 MG/ML solution  50 mL (50 mLs Oral Contrast Given 03/20/13 1751)  iohexol (OMNIPAQUE) 300 MG/ML solution 100 mL (100 mLs Intravenous Contrast Given 03/20/13 1837)    Labs Reviewed  CBC WITH DIFFERENTIAL - Abnormal; Notable for the following:    HCT 35.8 (*)    All other components within normal limits  COMPREHENSIVE METABOLIC PANEL - Abnormal; Notable for the following:    GFR calc non Af Amer 58 (*)    GFR calc Af Amer 67 (*)    All other components within normal limits  URINE CULTURE  URINALYSIS, ROUTINE W REFLEX MICROSCOPIC   Ct Abdomen Pelvis W Contrast  03/20/2013   *RADIOLOGY REPORT*  Clinical Data: Lower abdominal pain for approximately 2 weeks.  CT ABDOMEN AND PELVIS WITH CONTRAST  Technique:  Multidetector CT imaging of the abdomen and pelvis was performed following the standard protocol during bolus administration of intravenous contrast.  Contrast: 50mL OMNIPAQUE IOHEXOL 300 MG/ML  SOLN, OMNIPAQUE IOHEXOL 300 MG/ML  SOLN  Comparison: CT abdomen and pelvis 02/12/2006.  Findings: There is some scarring in the right middle lobe and lingula.  Lung bases are otherwise clear.  No pleural or pericardial effusion.  Two hepatic hemangiomas are identified as on the prior study, unchanged.  The liver is low attenuating compatible with fatty infiltration.  The gallbladder has been removed.  The spleen, adrenal glands, pancreas and right kidney appear normal.  A small cyst is seen off the lower pole of the left kidney.  Surgical anastomoses is seen in the rectum.  The colon is otherwise unremarkable.  The appendix is not visualized and may have been removed.  Stomach and small bowel appear normal.  The patient has undergone hysterectomy since the prior examination.  No lymphadenopathy or fluid is seen.  Bones demonstrate postoperative change of lower lumbar decompression.  Facet mediated grade 1 anterolisthesis of L4 on L5 appears slightly increased since the prior study.  No lytic or sclerotic lesion is  identified.  IMPRESSION:  1.  No acute finding or finding to explain the patient's symptoms. 2.  Fatty infiltration of the liver. 3.  Postoperative change as detailed above.   Original Report Authenticated By: Holley Dexter, M.D.   1. Lower abdominal pain, unspecified laterality     MDM    Nelia Shi, MD 03/30/13 [phone]

## 2013-03-20 NOTE — ED Notes (Signed)
Patient transported to CT 

## 2013-03-20 NOTE — ED Notes (Signed)
She c/o low abd. Pain and "spasms" x ~ 2 weeks.  Seen at her [facility] PCP office today; she is in no distress.

## 2013-03-22 LAB — URINE CULTURE
Colony Count: NO GROWTH
Culture: NO GROWTH

## 2013-04-03 ENCOUNTER — Emergency Department (HOSPITAL_COMMUNITY): Payer: Medicare Other

## 2013-04-03 ENCOUNTER — Emergency Department (HOSPITAL_COMMUNITY)
Admission: EM | Admit: 2013-04-03 | Discharge: 2013-04-03 | Disposition: A | Discharge status: Home / Self Care | Payer: Medicare Other | Attending: Emergency Medicine | Admitting: Emergency Medicine

## 2013-04-03 ENCOUNTER — Encounter (HOSPITAL_COMMUNITY): Payer: Self-pay

## 2013-04-03 DIAGNOSIS — IMO0002 Reserved for concepts with insufficient information to code with codable children: Secondary | ICD-10-CM | POA: Insufficient documentation

## 2013-04-03 DIAGNOSIS — E7800 Pure hypercholesterolemia, unspecified: Secondary | ICD-10-CM | POA: Insufficient documentation

## 2013-04-03 DIAGNOSIS — Z862 Personal history of diseases of the blood and blood-forming organs and certain disorders involving the immune mechanism: Secondary | ICD-10-CM | POA: Insufficient documentation

## 2013-04-03 DIAGNOSIS — Z860100 Personal history of colon polyps, unspecified: Secondary | ICD-10-CM | POA: Insufficient documentation

## 2013-04-03 DIAGNOSIS — F329 Major depressive disorder, single episode, unspecified: Secondary | ICD-10-CM | POA: Insufficient documentation

## 2013-04-03 DIAGNOSIS — R110 Nausea: Secondary | ICD-10-CM | POA: Insufficient documentation

## 2013-04-03 DIAGNOSIS — Z87440 Personal history of urinary (tract) infections: Secondary | ICD-10-CM | POA: Insufficient documentation

## 2013-04-03 DIAGNOSIS — Z8719 Personal history of other diseases of the digestive system: Secondary | ICD-10-CM | POA: Insufficient documentation

## 2013-04-03 DIAGNOSIS — Z91040 Latex allergy status: Secondary | ICD-10-CM | POA: Insufficient documentation

## 2013-04-03 DIAGNOSIS — Z7982 Long term (current) use of aspirin: Secondary | ICD-10-CM | POA: Insufficient documentation

## 2013-04-03 DIAGNOSIS — I10 Essential (primary) hypertension: Secondary | ICD-10-CM | POA: Insufficient documentation

## 2013-04-03 DIAGNOSIS — Z79899 Other long term (current) drug therapy: Secondary | ICD-10-CM | POA: Insufficient documentation

## 2013-04-03 DIAGNOSIS — Z8639 Personal history of other endocrine, nutritional and metabolic disease: Secondary | ICD-10-CM | POA: Insufficient documentation

## 2013-04-03 DIAGNOSIS — Z8679 Personal history of other diseases of the circulatory system: Secondary | ICD-10-CM | POA: Insufficient documentation

## 2013-04-03 DIAGNOSIS — F3289 Other specified depressive episodes: Secondary | ICD-10-CM | POA: Insufficient documentation

## 2013-04-03 DIAGNOSIS — Z8673 Personal history of transient ischemic attack (TIA), and cerebral infarction without residual deficits: Secondary | ICD-10-CM | POA: Insufficient documentation

## 2013-04-03 DIAGNOSIS — R109 Unspecified abdominal pain: Secondary | ICD-10-CM | POA: Insufficient documentation

## 2013-04-03 LAB — COMPREHENSIVE METABOLIC PANEL
ALT: 11 U/L (ref 0–35)
AST: 15 U/L (ref 0–37)
Albumin: 3.7 g/dL (ref 3.5–5.2)
Alkaline Phosphatase: 52 U/L (ref 39–117)
Chloride: 93 mEq/L — ABNORMAL LOW (ref 96–112)
Potassium: 3.8 mEq/L (ref 3.5–5.1)
Sodium: 130 mEq/L — ABNORMAL LOW (ref 135–145)
Total Bilirubin: 0.6 mg/dL (ref 0.3–1.2)
Total Protein: 7 g/dL (ref 6.0–8.3)

## 2013-04-03 LAB — URINALYSIS, ROUTINE W REFLEX MICROSCOPIC
Bilirubin Urine: NEGATIVE
Glucose, UA: NEGATIVE mg/dL
Ketones, ur: NEGATIVE mg/dL
Nitrite: NEGATIVE
Specific Gravity, Urine: 1.016 (ref 1.005–1.030)
pH: 7.5 (ref 5.0–8.0)

## 2013-04-03 LAB — CBC WITH DIFFERENTIAL/PLATELET
Basophils Absolute: 0 10*3/uL (ref 0.0–0.1)
Basophils Relative: 1 % (ref 0–1)
Eosinophils Absolute: 0.1 10*3/uL (ref 0.0–0.7)
Hemoglobin: 12.1 g/dL (ref 12.0–15.0)
MCH: 30.6 pg (ref 26.0–34.0)
MCHC: 33.7 g/dL (ref 30.0–36.0)
Monocytes Relative: 15 % — ABNORMAL HIGH (ref 3–12)
Neutro Abs: 1.9 10*3/uL (ref 1.7–7.7)
Neutrophils Relative %: 54 % (ref 43–77)
Platelets: 183 10*3/uL (ref 150–400)
RDW: 12.6 % (ref 11.5–15.5)

## 2013-04-03 LAB — LIPASE, BLOOD: Lipase: 39 U/L (ref 11–59)

## 2013-04-03 MED ORDER — MEPERIDINE HCL 50 MG PO TABS
25.0000 mg | ORAL_TABLET | ORAL | Status: DC | PRN
Start: 2013-04-03 — End: 2013-05-17

## 2013-04-03 NOTE — ED Notes (Signed)
Patient ate a ham sandwich, tolerating without difficulty.

## 2013-04-03 NOTE — ED Provider Notes (Signed)
CSN: [id number]     Arrival date & time 04/03/13  0915 History     First MD Initiated Contact with Patient 04/03/13 [phone]     Chief Complaint  Patient presents with  . Abdominal Pain   (Consider location/radiation/quality/duration/timing/severity/associated sxs/prior Treatment) HPI Comments: Patient complains of abdominal pain. She's had a history of ongoing abdominal pain for the last several months. She states that she seen Dr. Juanda Chance with GI and see her primary care physician. She states that no one has diagnosed her condition. She was seen here recently on July 20 for abdominal pain and had a CT scan was unremarkable. She was given a prescription for oral Demerol. She states this did great for controlling her pain. She came in today because she wants a more of the Demerol pills. She states the pain is a crampy pain it's mostly in her left lower abdomen. If the same type pain that she's had for the last few months. She states she's having normal bowel movements currently. She had a recent episode of some diarrhea and took Imodium which resulted diarrhea. She's had no vomiting or fevers. She has no urinary symptoms.  Patient is a 79 y.o. female presenting with abdominal pain.  Abdominal Pain Associated symptoms include abdominal pain. Pertinent negatives include no chest pain, no headaches and no shortness of breath.    Past Medical History  Diagnosis Date  . Depression   . Hypertension   . High cholesterol   . Rectal prolapse   . Hx of adenomatous colonic polyps   . Internal hemorrhoids   . Hypothyroidism   . Fecal incontinence   . TIA (transient ischemic attack)   . Stroke , sept 1, 2010sept 12, 2011    x 2  . History of frequent urinary tract infections     recent  . Migraine    Past Surgical History  Procedure Laterality Date  . Colon surgery  2010, for prolapsed organs after hystecrtomy surgery  . Appendectomy  2008  . Abdominal hysterectomy  2010  . Cholecystectomy    .  Lumbar laminectomy/decompression microdiscectomy N/A 01/07/2013    Procedure: LUMBAR LAMINECTOMY CENTRAL DECOMPRESSION L4-L5, BILATERAL FORAMENOTOMY L4,L5    ;  Surgeon: Jacki Cones, MD;  Location: [facility];  Service: Orthopedics;  Laterality: N/A;   Family History  Problem Relation Age of Onset  . Stroke Father   . Hypertension Maternal Aunt     x3  . Colon cancer Neg Hx   . Migraines Father    History  Substance Use Topics  . Smoking status: Never Smoker   . Smokeless tobacco: Never Used  . Alcohol Use: No   OB History   Grav Para Term Preterm Abortions TAB SAB Ect Mult Living                 Review of Systems  Constitutional: Negative for fever, chills, diaphoresis and fatigue.  HENT: Negative for congestion, rhinorrhea and sneezing.   Eyes: Negative.   Respiratory: Negative for cough, chest tightness and shortness of breath.   Cardiovascular: Negative for chest pain and leg swelling.  Gastrointestinal: Positive for nausea and abdominal pain. Negative for vomiting, diarrhea and blood in stool.  Genitourinary: Negative for frequency, hematuria, flank pain and difficulty urinating.  Musculoskeletal: Negative for back pain and arthralgias.  Skin: Negative for rash.  Neurological: Negative for dizziness, speech difficulty, weakness, numbness and headaches.    Allergies  Augmentin; Citalopram; Codeine; Ibuprofen; Latex; Lipitor; Septra; Keflet; Vicodin;  Ciprofloxacin; Depakene; Isoptin sr; Lisinopril; and Miconazole  Home Medications   Current Outpatient Rx  Name  Route  Sig  Dispense  Refill  . amLODipine (NORVASC) 2.5 MG tablet   Oral   Take 2.5 mg by mouth 2 (two) times daily.          Marland Kitchen aspirin EC 81 MG tablet   Oral   Take 81 mg by mouth daily.         . Bepotastine Besilate (BEPREVE) 1.5 % SOLN   Both Eyes   Place 1 drop into both eyes daily as needed (for itchy eyes).         . Calcium Carb-Cholecalciferol (CALCIUM 500/D) 500-400 MG-UNIT CHEW    Oral   Chew 1 tablet by mouth 2 (two) times daily.          . cholecalciferol (VITAMIN D) 400 UNITS TABS   Oral   Take 400 Units by mouth daily.         . clonazePAM (KLONOPIN) 1 MG tablet   Oral   Take 0.5-1 mg by mouth at bedtime as needed for anxiety.          . diazepam (VALIUM) 2 MG tablet   Oral   Take 2 mg by mouth every 6 (six) hours as needed. For anxiety         . docusate sodium (COLACE) 100 MG capsule   Oral   Take 100 mg by mouth daily.         Marland Kitchen estradiol (VIVELLE-DOT) 0.0375 MG/24HR   Transdermal   Place 1 patch onto the skin 2 (two) times a week.         . hyoscyamine (LEVSIN SL) 0.125 MG SL tablet   Oral   Take 0.125 mg by mouth every 8 (eight) hours as needed. For colon spasms         . losartan (COZAAR) 100 MG tablet   Oral   Take 100 mg by mouth every morning.         . meperidine (DEMEROL) 50 MG tablet   Oral   Take 0.5 tablets (25 mg total) by mouth every 4 (four) hours as needed for pain.   15 tablet   0   . Multiple Vitamin (MULTIVITAMIN) capsule   Oral   Take 1 capsule by mouth daily.         . nitrofurantoin (MACRODANTIN) 100 MG capsule   Oral   Take 100 mg by mouth 2 (two) times daily.         . NON FORMULARY      Weekly allergy shots with dr [place] Callas         . pantoprazole (PROTONIX) 40 MG tablet   Oral   Take 40 mg by mouth every morning.         . Probiotic Product (ALIGN) 4 MG CAPS   Oral   Take 1 tablet by mouth daily.         . propranolol (INDERAL) 40 MG tablet   Oral   Take 40 mg by mouth 3 (three) times daily.         . simvastatin (ZOCOR) 40 MG tablet   Oral   Take 40 mg by mouth every morning.          . vitamin B-12 (CYANOCOBALAMIN) 1000 MCG tablet   Oral   Take 1,000 mcg by mouth daily.         . vitamin E 200 UNIT capsule  Oral   Take 200 Units by mouth daily.         Marland Kitchen zolpidem (AMBIEN CR) 12.5 MG CR tablet   Oral   Take 12.5 mg by mouth at bedtime.         .  fluticasone (FLONASE) 50 MCG/ACT nasal spray   Nasal   Place 2 sprays into the nose daily. For allergies/congestion         . hydrocortisone (ANUSOL-HC) 25 MG suppository   Rectal   Place 25 mg rectally at bedtime.          . hydrOXYzine (ATARAX/VISTARIL) 25 MG tablet   Oral   Take 25 mg by mouth 3 (three) times daily as needed. For itching         . meperidine (DEMEROL) 50 MG tablet   Oral   Take 0.5 tablets (25 mg total) by mouth every 4 (four) hours as needed for pain.   15 tablet   0    BP 147/62  Pulse 54  Temp(Src) 98.5 F (36.9 C) (Oral)  Resp 16  SpO2 100% Physical Exam  Constitutional: She is oriented to person, place, and time. She appears well-developed and well-nourished.  HENT:  Head: Normocephalic and atraumatic.  Eyes: Pupils are equal, round, and reactive to light.  Neck: Normal range of motion. Neck supple.  Cardiovascular: Normal rate, regular rhythm and normal heart sounds.   Pulmonary/Chest: Effort normal and breath sounds normal. No respiratory distress. She has no wheezes. She has no rales. She exhibits no tenderness.  Abdominal: Soft. Bowel sounds are normal. There is tenderness (Mild tenderness to left lower quadrant). There is no rebound and no guarding.  Musculoskeletal: Normal range of motion. She exhibits no edema.  Lymphadenopathy:    She has no cervical adenopathy.  Neurological: She is alert and oriented to person, place, and time.  Skin: Skin is warm and dry. No rash noted.  Psychiatric: She has a normal mood and affect.    ED Course   Procedures (including critical care time)  Results for orders placed during the hospital encounter of 04/03/13  CBC WITH DIFFERENTIAL      Result Value Range   WBC 3.6 (*) 4.0 - 10.5 K/uL   RBC 3.95  3.87 - 5.11 MIL/uL   Hemoglobin 12.1  12.0 - 15.0 g/dL   HCT 16.1 (*) 09.6 - 04.5 %   MCV 90.9  78.0 - 100.0 fL   MCH 30.6  26.0 - 34.0 pg   MCHC 33.7  30.0 - 36.0 g/dL   RDW 40.9  81.1 - 91.4 %    Platelets 183  150 - 400 K/uL   Neutrophils Relative % 54  43 - 77 %   Neutro Abs 1.9  1.7 - 7.7 K/uL   Lymphocytes Relative 26  12 - 46 %   Lymphs Abs 0.9  0.7 - 4.0 K/uL   Monocytes Relative 15 (*) 3 - 12 %   Monocytes Absolute 0.6  0.1 - 1.0 K/uL   Eosinophils Relative 3  0 - 5 %   Eosinophils Absolute 0.1  0.0 - 0.7 K/uL   Basophils Relative 1  0 - 1 %   Basophils Absolute 0.0  0.0 - 0.1 K/uL  COMPREHENSIVE METABOLIC PANEL      Result Value Range   Sodium 130 (*) 135 - 145 mEq/L   Potassium 3.8  3.5 - 5.1 mEq/L   Chloride 93 (*) 96 - 112 mEq/L   CO2 29  19 - 32 mEq/L   Glucose, Bld 107 (*) 70 - 99 mg/dL   BUN 12  6 - 23 mg/dL   Creatinine, Ser 1.61  0.50 - 1.10 mg/dL   Calcium 9.9  8.4 - 09.6 mg/dL   Total Protein 7.0  6.0 - 8.3 g/dL   Albumin 3.7  3.5 - 5.2 g/dL   AST 15  0 - 37 U/L   ALT 11  0 - 35 U/L   Alkaline Phosphatase 52  39 - 117 U/L   Total Bilirubin 0.6  0.3 - 1.2 mg/dL   GFR calc non Af Amer 78 (*) >90 mL/min   GFR calc Af Amer 90 (*) >90 mL/min  LIPASE, BLOOD      Result Value Range   Lipase 39  11 - 59 U/L  URINALYSIS, ROUTINE W REFLEX MICROSCOPIC      Result Value Range   Color, Urine YELLOW  YELLOW   APPearance CLOUDY (*) CLEAR   Specific Gravity, Urine 1.016  1.005 - 1.030   pH 7.5  5.0 - 8.0   Glucose, UA NEGATIVE  NEGATIVE mg/dL   Hgb urine dipstick NEGATIVE  NEGATIVE   Bilirubin Urine NEGATIVE  NEGATIVE   Ketones, ur NEGATIVE  NEGATIVE mg/dL   Protein, ur NEGATIVE  NEGATIVE mg/dL   Urobilinogen, UA 0.2  0.0 - 1.0 mg/dL   Nitrite NEGATIVE  NEGATIVE   Leukocytes, UA NEGATIVE  NEGATIVE   Ct Abdomen Pelvis W Contrast  03/20/2013   *RADIOLOGY REPORT*  Clinical Data: Lower abdominal pain for approximately 2 weeks.  CT ABDOMEN AND PELVIS WITH CONTRAST  Technique:  Multidetector CT imaging of the abdomen and pelvis was performed following the standard protocol during bolus administration of intravenous contrast.  Contrast: 50mL OMNIPAQUE IOHEXOL 300  MG/ML  SOLN, OMNIPAQUE IOHEXOL 300 MG/ML  SOLN  Comparison: CT abdomen and pelvis 02/12/2006.  Findings: There is some scarring in the right middle lobe and lingula.  Lung bases are otherwise clear.  No pleural or pericardial effusion.  Two hepatic hemangiomas are identified as on the prior study, unchanged.  The liver is low attenuating compatible with fatty infiltration.  The gallbladder has been removed.  The spleen, adrenal glands, pancreas and right kidney appear normal.  A small cyst is seen off the lower pole of the left kidney.  Surgical anastomoses is seen in the rectum.  The colon is otherwise unremarkable.  The appendix is not visualized and may have been removed.  Stomach and small bowel appear normal.  The patient has undergone hysterectomy since the prior examination.  No lymphadenopathy or fluid is seen.  Bones demonstrate postoperative change of lower lumbar decompression.  Facet mediated grade 1 anterolisthesis of L4 on L5 appears slightly increased since the prior study.  No lytic or sclerotic lesion is identified.  IMPRESSION:  1.  No acute finding or finding to explain the patient's symptoms. 2.  Fatty infiltration of the liver. 3.  Postoperative change as detailed above.   Original Report Authenticated By: Holley Dexter, M.D.   Dg Abd Acute W/chest  04/03/2013   *RADIOLOGY REPORT*  Clinical Data: 63-month history of lower abdominal pain, nausea, and diarrhea.  ACUTE ABDOMEN SERIES (ABDOMEN 2 VIEW & CHEST 1 VIEW)  Comparison: CT abdomen and pelvis 03/20/2013.  Two-view chest x-ray 01/03/2013.  Findings: Bowel gas pattern unremarkable without evidence of obstruction or significant ileus.  No evidence of free air or significant air fluid levels on the erect image.  Moderate  stool burden throughout the colon.  Surgical clips in the right upper quadrant from prior cholecystectomy.  No visible opaque urinary tract calculi.  Aorto-iliac atherosclerosis without aneurysm. Visible psoas margins.   Slight thoracolumbar scoliosis convex right.  Degenerative changes involving the lower lumbar spine.  Cardiomediastinal silhouette unremarkable, unchanged. Stable chronic scarring at the lung bases.  Lungs otherwise clear. Bronchovascular markings normal.  Pulmonary vascularity normal.  No pneumothorax.  No pleural effusions.  IMPRESSION:  1.  No acute abdominal abnormality. 2.  No acute cardiopulmonary disease.  Stable scarring at the lung bases.   Original Report Authenticated By: Hulan Saas, M.D.     Dg Abd Acute W/chest  04/03/2013   *RADIOLOGY REPORT*  Clinical Data: 54-month history of lower abdominal pain, nausea, and diarrhea.  ACUTE ABDOMEN SERIES (ABDOMEN 2 VIEW & CHEST 1 VIEW)  Comparison: CT abdomen and pelvis 03/20/2013.  Two-view chest x-ray 01/03/2013.  Findings: Bowel gas pattern unremarkable without evidence of obstruction or significant ileus.  No evidence of free air or significant air fluid levels on the erect image.  Moderate stool burden throughout the colon.  Surgical clips in the right upper quadrant from prior cholecystectomy.  No visible opaque urinary tract calculi.  Aorto-iliac atherosclerosis without aneurysm. Visible psoas margins.  Slight thoracolumbar scoliosis convex right.  Degenerative changes involving the lower lumbar spine.  Cardiomediastinal silhouette unremarkable, unchanged. Stable chronic scarring at the lung bases.  Lungs otherwise clear. Bronchovascular markings normal.  Pulmonary vascularity normal.  No pneumothorax.  No pleural effusions.  IMPRESSION:  1.  No acute abdominal abnormality. 2.  No acute cardiopulmonary disease.  Stable scarring at the lung bases.   Original Report Authenticated By: Hulan Saas, M.D.   1. Abdominal pain     MDM  Patient with ongoing abdominal pain. She's had multiple evaluations for same. She had a recent CT scan of her abdomen which was unremarkable. Today there is no signs of obstruction on an acute abdominal series. Her  labs are similar to baseline. Her urine is unremarkable. She was discharged home in good condition. She is adamant about getting another prescription for by mouth Demerol which she says works great for treatment of her abdominal pain. I advised her that I can't only give her a prescription for a few more pills and that she will need a followup with her primary care physician or her gastroenterologist for ongoing treatment of this chronic abdominal pain.  Rolan Bucco, MD 04/03/13 [phone]

## 2013-04-03 NOTE — ED Notes (Signed)
Patient sitting quietly in room. Patient was asked if she wanted pain medication. She stated that she wanted some water.

## 2013-04-03 NOTE — ED Notes (Signed)
Per EMS patient has had abdominal pain and cramping for several weeks. Has nausea and diarrhea. No vomiting. NO underlying dx that can explain her symptoms. Was here a few weeks for the same reason. Was given demerol which helped her stomach and migraines, patient requesting more demerol. Was to fu with pcp but pcp is out of town. Was given antibiotic for unknown reason by [facility]. Prescription was called in was not evaluated by anyone. Marland Kitchen

## 2013-04-03 NOTE — ED Notes (Signed)
Patient states that she has experienced burning when urinating. Patient now stating that she is also here for migraines.

## 2013-04-04 ENCOUNTER — Encounter: Payer: Self-pay | Admitting: *Deleted

## 2013-04-04 ENCOUNTER — Telehealth: Payer: Self-pay | Admitting: Internal Medicine

## 2013-04-04 NOTE — Telephone Encounter (Signed)
Pt reports she is still having abdominal pain; went to ED yesterday. According to ED notes, pain was controlled with Demerol. ER informed her they could not give her Demerol, she would have to see GI. CT was negative 03/30/13 and Abd Xray showed no GI abnormalities yesterday. Pt will see Dr Juanda Chance in am.

## 2013-04-05 ENCOUNTER — Ambulatory Visit (INDEPENDENT_AMBULATORY_CARE_PROVIDER_SITE_OTHER): Payer: Medicare Other | Admitting: Internal Medicine

## 2013-04-05 ENCOUNTER — Encounter: Payer: Self-pay | Admitting: Internal Medicine

## 2013-04-05 VITALS — BP 118/60 | HR 72 | Ht 61.5 in | Wt 142.5 lb

## 2013-04-05 DIAGNOSIS — R1031 Right lower quadrant pain: Secondary | ICD-10-CM

## 2013-04-05 DIAGNOSIS — Z86010 Personal history of colonic polyps: Secondary | ICD-10-CM

## 2013-04-05 MED ORDER — HYOSCYAMINE SULFATE 0.125 MG SL SUBL: 0.1250 mg | SUBLINGUAL_TABLET | Freq: Three times a day (TID) | SUBLINGUAL | Status: DC | PRN

## 2013-04-05 MED ORDER — MOVIPREP 100 G PO SOLR: 1.0000 | Freq: Once | ORAL | Status: DC

## 2013-04-05 NOTE — Progress Notes (Signed)
Kathryn [name] Jun 26, 1931 MRN [id number]   History of Present Illness:  This is an 79 year old white female who was seen in the emergency room on 2 separate occasions in the last several weeks for severe lower abdominal pain. She woke up at night with severe crampy abdominal pain.  CT scan of the abdomen and her blood chemistries done in ED  were normal. She denied any rectal bleeding. We have seen her in the past for irritable bowel syndrome and history of colon polyps. She had a remote cholecystectomy and surgery for rectal prolapse in 2010. CT scan of the abdomen also showed fatty liver but no acute changes in her colon. Her last colonoscopy in January 2009 showed adenomatous polyps but no other abnormality. She has high blood pressure and is followed by Dr. Alanda Amass. She has been on Norvasc, Cozaar and Inderal. Her blood pressure fluctuates during the day as well as at night. There is no history of ischemic colitis. Her bowel habits are regular, having several bowel movements a day. She takes Levsin sublingually when necessary for colon spasm. After having an attack of abdominal pain, she usually feels somewhat weak the next day. She denies any fever or rectal bleeding. She has a history of severe migraine headaches and history of CVA. She is on aspirin daily.  Past Medical History  Diagnosis Date  . Depression   . Hypertension   . High cholesterol   . Rectal prolapse   . Hx of adenomatous colonic polyps   . Internal hemorrhoids   . Hypothyroidism   . Fecal incontinence   . TIA (transient ischemic attack)   . Stroke , sept 1, 2010sept 12, 2011    x 2  . History of frequent urinary tract infections     recent  . Migraine   . Fatty liver 03/20/13   Past Surgical History  Procedure Laterality Date  . Colon surgery  2010, for prolapsed organs after hystecrtomy surgery  . Appendectomy  2008  . Abdominal hysterectomy  2010  . Cholecystectomy    . Lumbar laminectomy/decompression  microdiscectomy N/A 01/07/2013    Procedure: LUMBAR LAMINECTOMY CENTRAL DECOMPRESSION L4-L5, BILATERAL FORAMENOTOMY L4,L5    ;  Surgeon: Jacki Cones, MD;  Location: [facility];  Service: Orthopedics;  Laterality: N/A;    reports that she has never smoked. She has never used smokeless tobacco. She reports that she does not drink alcohol or use illicit drugs. family history includes Hypertension in her maternal aunt; Migraines in her father; and Stroke in her father.  There is no history of Colon cancer. Allergies  Allergen Reactions  . Augmentin (Amoxicillin-Pot Clavulanate) Swelling    Throat   . Citalopram Swelling    Throat and eyes   . Codeine Anaphylaxis  . Ibuprofen Swelling    Throat and eyes   . Latex Swelling and Rash    Swelling to troat  . Lipitor (Atorvastatin) Swelling    throat  . Septra (Bactrim) Anaphylaxis  . Keflet (Cephalexin) Diarrhea    Upset stomach  . Vicodin (Hydrocodone-Acetaminophen) Other (See Comments)    stroke  . Ciprofloxacin Nausea And Vomiting  . Depakene (Valproate Sodium) Hives    All over body   . Isoptin Sr (Verapamil Hcl Er) Cough  . Lisinopril Cough  . Miconazole Rash        Review of Systems: Negative for reflux, dysphagia, weight loss  The remainder of the 10 point ROS is negative except as outlined in H&P  Physical Exam: General appearance  Well developed, in no distress. Eyes- non icteric. HEENT nontraumatic, normocephalic. Mouth no lesions, tongue papillated, no cheilosis. Neck supple without adenopathy, thyroid not enlarged, no carotid bruits, no JVD. Lungs Clear to auscultation bilaterally. Cor normal S1, normal S2, regular rhythm, no murmur,  quiet precordium. Abdomen: Soft nontender with normoactive bowel sounds. No distention. No bruit no tympany. Rectal: Small amount also of Hemoccult negative stool. Extremities no pedal edema. Skin no lesions. Neurological alert and oriented x 3. Psychological normal mood and  affect.  Assessment and Plan:  Problem #34 79 year old white female with acute episodes of lower abdominal pain of unclear etiology. The pain seems to occur mostly at night which raises a question of low flow state. She is on 3 separate high blood pressure medications and I wonder if she gets hypotensive during the night. Her blood pressure today is 118/60. Other possibilities include irritable bowel syndrome.IBS typically presents problems during the day rather than at night. Constipation would be another source although her bowel habits have been regular 3 times a day and they are soft or loose. Cecal volvulus or symptomatic diverticulosis or diverticulitis are additional possibilities. Her CT scan was negative for acute process and a white cell count was normal. We will proceed with a colonoscopy to look for anatomic abnormalities or any ischemic changes. I asked her to check her blood pressure at night to see if it drops below 100 systolic and I will send a letter to Dr. Alanda Amass to assess the situation from the hypertension standpoint. She is mildly anemic with a hemoglobin of 12.1 and a hematocrit of 35.9.  Problem #2 History of adenomatous polyps of the colon. She is due for a colonoscopy. Her last exam was in January 2009.   04/05/2013 Lina Sar

## 2013-04-05 NOTE — Patient Instructions (Addendum)
You have been scheduled for a colonoscopy with propofol. Please follow written instructions given to you at your visit today.  Please pick up your prep kit at the pharmacy within the next 1-3 days. If you use inhalers (even only as needed), please bring them with you on the day of your procedure. Your physician has requested that you go to www.startemmi.com and enter the access code given to you at your visit today. This web site gives a general overview about your procedure. However, you should still follow specific instructions given to you by our office regarding your preparation for the procedure.  We have sent the following medications to your pharmacy for you to pick up at your convenience: Levsin SL  CC:Dr Mila Palmer, Dr Alanda Amass

## 2013-04-18 ENCOUNTER — Other Ambulatory Visit: Payer: Self-pay | Admitting: *Deleted

## 2013-04-18 MED ORDER — PANTOPRAZOLE SODIUM 40 MG PO TBEC: 40.0000 mg | DELAYED_RELEASE_TABLET | Freq: Every morning | ORAL | Status: DC

## 2013-04-18 NOTE — Telephone Encounter (Signed)
Rx was sent to pharmacy electronically via [employer] 

## 2013-04-27 ENCOUNTER — Encounter: Payer: Self-pay | Admitting: Internal Medicine

## 2013-05-10 ENCOUNTER — Encounter: Payer: Medicare Other | Admitting: Internal Medicine

## 2013-05-17 ENCOUNTER — Ambulatory Visit (INDEPENDENT_AMBULATORY_CARE_PROVIDER_SITE_OTHER): Payer: Medicare Other | Admitting: Diagnostic Neuroimaging

## 2013-05-17 ENCOUNTER — Encounter: Payer: Self-pay | Admitting: Diagnostic Neuroimaging

## 2013-05-17 VITALS — BP 121/70 | HR 61 | Ht 63.0 in | Wt 143.0 lb

## 2013-05-17 DIAGNOSIS — R51 Headache: Secondary | ICD-10-CM

## 2013-05-17 NOTE — Patient Instructions (Signed)
Follow up as needed

## 2013-05-17 NOTE — Progress Notes (Signed)
[facility]  PATIENT: Kathryn [name] DOB: 1931/08/09  REFERRING CLINICIAN:  HISTORY FROM: patient  REASON FOR VISIT: follow up (transfer, Dr. Sandria Manly)   HISTORICAL  CHIEF COMPLAINT:  Chief Complaint  Patient presents with  . Numbness    HISTORY OF PRESENT ILLNESS:   UPDATE 05/17/13: Still with daily, nagging headaches. Poor sleep. Back pain, s/p surgery. Recent ER visit for unexplained abdominal pain. Demerol seems to help stomach pains.  PRIOR HPI (10/28/12, Dr. Sandria Manly): 79 year old  right-handed white widowed female with a  hx of an episode of dizziness and visual disturbance to her left,  like "lightening to the left". I saw her 05/25/2009. Examination revealed no definite field cut and MRI study of the brain 06/04/09 showed a remote infarct in the right caudate head and left lentiform nucleus. No changes of an acute stroke. There was evidence of chronic microvascular ischemia. MRA of the  intracranial vessels showed no significant stenosis 06/04/2009. She was in her yard  trimming shrubery 05/03/2010 and fell to the ground. She lost consciousness and awoke with numbness in her left arm and left leg.  She noticed that she was dragging  her left leg and she had numbness in her left foot. She walked into her home was diaphoretic, and got into the bathtub. Her sister called and  came to see her. She noted difficulty seeing in her left visual field. She went to [facility] emergency room about 12 noon.  She had a CT scan of the brain without contrast CPK MB, urinalysis, CBC, and CMP which were unremarkable. She was evaluated for continued visual field loss with weakness and numbness of her left foot in the office. She denies chest pain or palpitations. On 77/1/11she had a headache that was vaguely located. She has a history of risk factors for stroke including hypertension and high cholesterol .She lives by herself. She is independent in activities of daily living. MRI study of  the brain 05/03/10 without contrast showed an acute stroke in the right posterior limb of the internal capsule and right thalamic region.She has frequent headaches since childhood thought to represent migraine.MRA of the neck with 40% right internal artery stenosis. MRA of the head mild to moderate intracranial atherosclerotic disease. She was placed on  ASA and Zocor  Her numbness and visual complaints cleared during the hospitalization a 2-D echocardiogram was normal . Doppler study of the carotids by Dr. Alanda Amass 04/2011 was normal She was seen in ER  11/18/10 for headaches. MRI of the brain was without acute intracranial anormality and showed  chronic SVD. August of 2012, she  complained of chronic pelvic pain. Blood work 10/17/2011  in the ER revealed sodium 130 and Urinalysis was negative. Acute abdomen series showed no acute findings. She has been treated by Dr. Nicholas Lose for urinary tract and yeast infections. She was unable to tolerate vaginal estrogen cream. She has seen  Dr. Wanda Plump, Urologist.She has  migraine and I have placed her on propranolol 40 mg tid without benefit and Topamax 25 mg with side effects .Lives alone, is independent in activities of daily living ,and instrumental activit of daily living.It gets worse after lunch. She has  diarrhea with simvastatin. 04/01/2012 she fell striking her head. A neighbor helped her up. She felt again backwards 04/02/2012.She has a history of headaches for 2 years that are constant and neck pain. The headache occurs in both temporal and vertex regionsis and is  decreased using an ice pack. It  occurs daily. She awakens  at night with both hands tingling. At times she stands up and both legs shake. She has difficulty taking Advil for neck pain because it upsets her stomach. She has dizziness "all the time" sitting, lying,and standing. It can be associated with nausea but no vomiting.04/19/2012=( MMSE 26/30. Clock drawing task 4/4. Animal fluency test 15. Myrtis Ser index of  independence in activities of daily living 5. Lawton-Brody instrumental  activities of daily living scale 8.Geriatric depression scale 9/15. Falls assessment tool score 18). 05/03/12 she went to the ER for headache with negative CT scan except for old strokes. She had been taking 2 Tylenol per day for headaches, but her headaches have resolved.  REVIEW OF SYSTEMS: Full 14 system review of systems performed and notable only for weakness dizziness passing out insomnia.  ALLERGIES: Allergies  Allergen Reactions  . Augmentin [Amoxicillin-Pot Clavulanate] Swelling    Throat   . Citalopram Swelling    Throat and eyes   . Codeine Anaphylaxis  . Ibuprofen Swelling    Throat and eyes   . Latex Swelling and Rash    Swelling to troat  . Lipitor [Atorvastatin] Swelling    throat  . Septra [Bactrim] Anaphylaxis  . Keflet [Cephalexin] Diarrhea    Upset stomach  . Vicodin [Hydrocodone-Acetaminophen] Other (See Comments)    stroke  . Ciprofloxacin Nausea And Vomiting  . Depakene [Valproate Sodium] Hives    All over body   . Isoptin Sr [Verapamil Hcl Er] Cough  . Lisinopril Cough  . Miconazole Rash    HOME MEDICATIONS: Outpatient Prescriptions Prior to Visit  Medication Sig Dispense Refill  . amLODipine (NORVASC) 2.5 MG tablet Take 2.5 mg by mouth daily.       Marland Kitchen aspirin EC 81 MG tablet Take 81 mg by mouth daily.      . clonazePAM (KLONOPIN) 1 MG tablet Take 0.5-1 mg by mouth at bedtime as needed for anxiety.       . diazepam (VALIUM) 2 MG tablet Take 2 mg by mouth every 6 (six) hours as needed. For anxiety      . hyoscyamine (LEVSIN SL) 0.125 MG SL tablet Take 1 tablet (0.125 mg total) by mouth every 8 (eight) hours as needed. For colon spasms  30 tablet  1  . losartan (COZAAR) 100 MG tablet Take 100 mg by mouth every morning.      . pantoprazole (PROTONIX) 40 MG tablet Take 1 tablet (40 mg total) by mouth every morning.  90 tablet  2  . propranolol (INDERAL) 40 MG tablet Take 40 mg by  mouth 3 (three) times daily.      . simvastatin (ZOCOR) 40 MG tablet Take 40 mg by mouth every morning.       . traMADol-acetaminophen (ULTRACET) 37.5-325 MG per tablet Take 0.5 tablets by mouth every 4 (four) hours as needed.       . vitamin E 200 UNIT capsule Take 400 Units by mouth daily.       Marland Kitchen zolpidem (AMBIEN CR) 12.5 MG CR tablet Take 12.5 mg by mouth at bedtime.      . Bepotastine Besilate (BEPREVE) 1.5 % SOLN Place 1 drop into both eyes daily as needed (for itchy eyes).      . Calcium Carb-Cholecalciferol (CALCIUM 500/D) 500-400 MG-UNIT CHEW Chew 1 tablet by mouth 2 (two) times daily.       . cholecalciferol (VITAMIN D) 400 UNITS TABS Take 400 Units by mouth daily.      Marland Kitchen  dicyclomine (BENTYL) 10 MG capsule Take 10 mg by mouth 2 (two) times daily.      Marland Kitchen docusate sodium (COLACE) 100 MG capsule Take 100 mg by mouth daily.      Marland Kitchen estradiol (VIVELLE-DOT) 0.0375 MG/24HR Place 1 patch onto the skin 2 (two) times a week.      . fluticasone (FLONASE) 50 MCG/ACT nasal spray Place 2 sprays into the nose daily. For allergies/congestion      . hydrocortisone (ANUSOL-HC) 25 MG suppository Place 25 mg rectally at bedtime.       . hydrOXYzine (ATARAX/VISTARIL) 25 MG tablet Take 25 mg by mouth 3 (three) times daily as needed. For itching      . Loperamide HCl (IMODIUM PO) Take by mouth as needed.      . meperidine (DEMEROL) 50 MG tablet Take 0.5 tablets (25 mg total) by mouth every 4 (four) hours as needed for pain.  15 tablet  0  . MOVIPREP 100 G SOLR Take 1 kit (200 g total) by mouth once.  1 kit  0  . Multiple Vitamin (MULTIVITAMIN) capsule Take 1 capsule by mouth daily.      . NON FORMULARY Weekly allergy shots with dr Felton Callas      . Probiotic Product (ALIGN) 4 MG CAPS Take 1 tablet by mouth daily.      . vitamin B-12 (CYANOCOBALAMIN) 1000 MCG tablet Take 1,000 mcg by mouth daily.       No facility-administered medications prior to visit.    PAST MEDICAL HISTORY: Past Medical History    Diagnosis Date  . Depression   . Hypertension   . High cholesterol   . Rectal prolapse   . Hx of adenomatous colonic polyps   . Internal hemorrhoids   . Hypothyroidism   . Fecal incontinence   . TIA (transient ischemic attack)   . Stroke , sept 1, 2010sept 12, 2011    x 2  . History of frequent urinary tract infections     recent  . Migraine   . Fatty liver 03/20/13    PAST SURGICAL HISTORY: Past Surgical History  Procedure Laterality Date  . Colon surgery  2010, for prolapsed organs after hystecrtomy surgery  . Appendectomy  2008  . Abdominal hysterectomy  2010  . Cholecystectomy    . Lumbar laminectomy/decompression microdiscectomy N/A 01/07/2013    Procedure: LUMBAR LAMINECTOMY CENTRAL DECOMPRESSION L4-L5, BILATERAL FORAMENOTOMY L4,L5    ;  Surgeon: Jacki Cones, MD;  Location: [facility];  Service: Orthopedics;  Laterality: N/A;    FAMILY HISTORY: Family History  Problem Relation Age of Onset  . Stroke Father   . Migraines Father   . CVA Father   . Heart attack Father   . Hypertension Maternal Aunt     x3  . Colon cancer Neg Hx   . CVA Mother     SOCIAL HISTORY:  History   Social History  . Marital Status: Widowed    Spouse Name: N/A    Number of Children: 1  . Years of Education: HS   Occupational History  . Retired    Social History Main Topics  . Smoking status: Never Smoker   . Smokeless tobacco: Never Used  . Alcohol Use: No  . Drug Use: No  . Sexual Activity: Not on file   Other Topics Concern  . Not on file   Social History Narrative   Patient lives at home alone.   Caffeine Use: quit in 1959  PHYSICAL EXAM  Filed Vitals:   05/17/13 1341  BP: 121/70  Pulse: 61  Height: 5\' 3"  (1.6 m)  Weight: 143 lb (64.864 kg)    Not recorded    Body mass index is 25.34 kg/(m^2).  GENERAL EXAM: Patient is in no distress  CARDIOVASCULAR: Regular rate and rhythm, no murmurs, no carotid bruits  NEUROLOGIC: MENTAL STATUS: awake,  alert, language fluent, comprehension intact, naming intact CRANIAL NERVE: POST SURGICAL PUPILS. Visual fields full to confrontation, extraocular muscles intact, no nystagmus, facial sensation and strength symmetric, uvula midline, shoulder shrug symmetric, tongue midline. MOTOR: normal bulk and tone, DIFFUSE 4/5 strength in the BUE, BLE SENSORY: normal and symmetric to light touch COORDINATION: finger-nose-finger, fine finger movements normal REFLEXES: deep tendon reflexes present and symmetric GAIT/STATION: narrow based gait; ANTALGIC. SHORT STEPS.    DIAGNOSTIC DATA (LABS, IMAGING, TESTING) - I reviewed patient records, labs, notes, testing and imaging myself where available.  Lab Results  Component Value Date   WBC 3.6* 04/03/2013   HGB 12.1 04/03/2013   HCT 35.9* 04/03/2013   MCV 90.9 04/03/2013   PLT 183 04/03/2013      Component Value Date/Time   NA 130* 04/03/2013 0949   K 3.8 04/03/2013 0949   CL 93* 04/03/2013 0949   CO2 29 04/03/2013 0949   GLUCOSE 107* 04/03/2013 0949   BUN 12 04/03/2013 0949   CREATININE 0.76 04/03/2013 0949   CALCIUM 9.9 04/03/2013 0949   PROT 7.0 04/03/2013 0949   ALBUMIN 3.7 04/03/2013 0949   AST 15 04/03/2013 0949   ALT 11 04/03/2013 0949   ALKPHOS 52 04/03/2013 0949   BILITOT 0.6 04/03/2013 0949   GFRNONAA 78* 04/03/2013 0949   GFRAA 90* 04/03/2013 0949   Lab Results  Component Value Date   CHOL  Value: 201        ATP III CLASSIFICATION:  <200     mg/dL   Desirable  161-096  mg/dL   Borderline High  >=045    mg/dL   High       * 4/0/9811   HDL 56 05/03/2010   LDLCALC  Value: 115        Total Cholesterol/HDL:CHD Risk Coronary Heart Disease Risk Table                     Men   Women  1/2 Average Risk   3.4   3.3  Average Risk       5.0   4.4  2 X Average Risk   9.6   7.1  3 X Average Risk  23.4   11.0        Use the calculated Patient Ratio above and the CHD Risk Table to determine the patient's CHD Risk.        ATP III CLASSIFICATION (LDL):  <100     mg/dL   Optimal  914-782  mg/dL    Near or Above                    Optimal  130-159  mg/dL   Borderline  956-213  mg/dL   High  >086     mg/dL   Very High* 01/06/8468   TRIG 151* 05/03/2010   CHOLHDL 3.6 05/03/2010   No results found for this basename: HGBA1C   No results found for this basename: VITAMINB12   No results found for this basename: TSH     ASSESSMENT AND PLAN  79 y.o. year old female  here with chronic headache, abdominal pain, insomnia. Intolerant of numerous medications tried by Dr. Sandria Manly in the past. Challenging situation. Patient not that interested in trying new medications at this time. Advised to follow up with PCP.  Return for return to PCP.    Suanne Marker, MD 05/17/2013, 3:39 PM Certified in Neurology, Neurophysiology and Neuroimaging  [facility] [address] [place], [place] [address] [phone]

## 2013-05-20 ENCOUNTER — Inpatient Hospital Stay (HOSPITAL_COMMUNITY)
Admission: EM | Admit: 2013-05-20 | Discharge: 2013-05-22 | DRG: 312 | Disposition: A | Discharge status: Home / Self Care | Payer: Medicare Other | Attending: Internal Medicine | Admitting: Internal Medicine

## 2013-05-20 ENCOUNTER — Emergency Department (HOSPITAL_COMMUNITY): Payer: Medicare Other

## 2013-05-20 ENCOUNTER — Other Ambulatory Visit: Payer: Self-pay | Admitting: *Deleted

## 2013-05-20 ENCOUNTER — Encounter (HOSPITAL_COMMUNITY): Payer: Self-pay | Admitting: *Deleted

## 2013-05-20 DIAGNOSIS — Z860100 Personal history of colon polyps, unspecified: Secondary | ICD-10-CM

## 2013-05-20 DIAGNOSIS — I10 Essential (primary) hypertension: Secondary | ICD-10-CM | POA: Diagnosis present

## 2013-05-20 DIAGNOSIS — K7689 Other specified diseases of liver: Secondary | ICD-10-CM | POA: Diagnosis present

## 2013-05-20 DIAGNOSIS — K648 Other hemorrhoids: Secondary | ICD-10-CM | POA: Diagnosis present

## 2013-05-20 DIAGNOSIS — E7800 Pure hypercholesterolemia, unspecified: Secondary | ICD-10-CM | POA: Diagnosis present

## 2013-05-20 DIAGNOSIS — G43909 Migraine, unspecified, not intractable, without status migrainosus: Secondary | ICD-10-CM | POA: Diagnosis present

## 2013-05-20 DIAGNOSIS — E039 Hypothyroidism, unspecified: Secondary | ICD-10-CM | POA: Diagnosis present

## 2013-05-20 DIAGNOSIS — R296 Repeated falls: Secondary | ICD-10-CM

## 2013-05-20 DIAGNOSIS — Z23 Encounter for immunization: Secondary | ICD-10-CM

## 2013-05-20 DIAGNOSIS — Z7982 Long term (current) use of aspirin: Secondary | ICD-10-CM

## 2013-05-20 DIAGNOSIS — W19XXXA Unspecified fall, initial encounter: Secondary | ICD-10-CM | POA: Diagnosis present

## 2013-05-20 DIAGNOSIS — F329 Major depressive disorder, single episode, unspecified: Secondary | ICD-10-CM | POA: Diagnosis present

## 2013-05-20 DIAGNOSIS — F3289 Other specified depressive episodes: Secondary | ICD-10-CM | POA: Diagnosis present

## 2013-05-20 DIAGNOSIS — Z79899 Other long term (current) drug therapy: Secondary | ICD-10-CM

## 2013-05-20 DIAGNOSIS — Z9181 History of falling: Secondary | ICD-10-CM

## 2013-05-20 DIAGNOSIS — Z8673 Personal history of transient ischemic attack (TIA), and cerebral infarction without residual deficits: Secondary | ICD-10-CM

## 2013-05-20 DIAGNOSIS — I951 Orthostatic hypotension: Principal | ICD-10-CM | POA: Diagnosis present

## 2013-05-20 DIAGNOSIS — E871 Hypo-osmolality and hyponatremia: Secondary | ICD-10-CM | POA: Diagnosis present

## 2013-05-20 DIAGNOSIS — R55 Syncope and collapse: Secondary | ICD-10-CM | POA: Diagnosis present

## 2013-05-20 DIAGNOSIS — W503XXA Accidental bite by another person, initial encounter: Secondary | ICD-10-CM

## 2013-05-20 DIAGNOSIS — S32009A Unspecified fracture of unspecified lumbar vertebra, initial encounter for closed fracture: Secondary | ICD-10-CM | POA: Diagnosis present

## 2013-05-20 DIAGNOSIS — R32 Unspecified urinary incontinence: Secondary | ICD-10-CM | POA: Diagnosis present

## 2013-05-20 LAB — URINALYSIS, ROUTINE W REFLEX MICROSCOPIC
Hgb urine dipstick: NEGATIVE
Leukocytes, UA: NEGATIVE
Nitrite: NEGATIVE
Specific Gravity, Urine: 1.006 (ref 1.005–1.030)
Urobilinogen, UA: 0.2 mg/dL (ref 0.0–1.0)

## 2013-05-20 LAB — COMPREHENSIVE METABOLIC PANEL
ALT: 12 U/L (ref 0–35)
AST: 24 U/L (ref 0–37)
CO2: 25 mEq/L (ref 19–32)
Calcium: 9.9 mg/dL (ref 8.4–10.5)
Chloride: 89 mEq/L — ABNORMAL LOW (ref 96–112)
GFR calc non Af Amer: 78 mL/min — ABNORMAL LOW (ref >90)
Sodium: 125 mEq/L — ABNORMAL LOW (ref 135–145)

## 2013-05-20 LAB — DIFFERENTIAL
Basophils Absolute: 0 10*3/uL (ref 0.0–0.1)
Eosinophils Relative: 1 % (ref 0–5)
Lymphocytes Relative: 11 % — ABNORMAL LOW (ref 12–46)
Neutro Abs: 5.4 10*3/uL (ref 1.7–7.7)
Neutrophils Relative %: 80 % — ABNORMAL HIGH (ref 43–77)

## 2013-05-20 LAB — APTT: aPTT: 31 seconds (ref 24–37)

## 2013-05-20 LAB — PROTIME-INR
INR: 1.01 (ref 0.00–1.49)
Prothrombin Time: 13.1 seconds (ref 11.6–15.2)

## 2013-05-20 LAB — POCT I-STAT TROPONIN I: Troponin i, poc: 0 ng/mL (ref 0.00–0.08)

## 2013-05-20 LAB — CBC
Platelets: 169 10*3/uL (ref 150–400)
RDW: 12.9 % (ref 11.5–15.5)
WBC: 6.8 10*3/uL (ref 4.0–10.5)

## 2013-05-20 MED ORDER — PANTOPRAZOLE SODIUM 40 MG PO TBEC
40.0000 mg | DELAYED_RELEASE_TABLET | Freq: Every morning | ORAL | Status: DC
  Administered 2013-05-21 – 2013-05-22 (×2): 40 mg via ORAL
  Filled 2013-05-20 (×2): qty 1

## 2013-05-20 MED ORDER — TRAMADOL-ACETAMINOPHEN 37.5-325 MG PO TABS
0.5000 | ORAL_TABLET | ORAL | Status: DC | PRN
  Administered 2013-05-21 – 2013-05-22 (×2): 0.5 via ORAL
  Filled 2013-05-20 (×2): qty 1

## 2013-05-20 MED ORDER — TRAMADOL HCL 50 MG PO TABS
50.0000 mg | ORAL_TABLET | Freq: Once | ORAL | Status: AC
  Administered 2013-05-20: 50 mg via ORAL
  Filled 2013-05-20: qty 1

## 2013-05-20 MED ORDER — HEPARIN SODIUM (PORCINE) 5000 UNIT/ML IJ SOLN
5000.0000 [IU] | Freq: Three times a day (TID) | INTRAMUSCULAR | Status: DC
  Administered 2013-05-21 – 2013-05-22 (×4): 5000 [IU] via SUBCUTANEOUS
  Filled 2013-05-20 (×7): qty 1

## 2013-05-20 MED ORDER — PROPRANOLOL HCL 40 MG PO TABS
40.0000 mg | ORAL_TABLET | Freq: Three times a day (TID) | ORAL | Status: DC
  Administered 2013-05-21 (×2): 40 mg via ORAL
  Filled 2013-05-20 (×5): qty 1

## 2013-05-20 MED ORDER — SODIUM CHLORIDE 0.9 % IJ SOLN
3.0000 mL | Freq: Two times a day (BID) | INTRAMUSCULAR | Status: DC
  Administered 2013-05-21: 3 mL via INTRAVENOUS

## 2013-05-20 MED ORDER — SODIUM CHLORIDE 0.9 % IV SOLN
INTRAVENOUS | Status: DC
  Administered 2013-05-21: via INTRAVENOUS
  Administered 2013-05-21: 100 mL/h via INTRAVENOUS
  Administered 2013-05-22: 06:00:00 via INTRAVENOUS

## 2013-05-20 MED ORDER — SODIUM CHLORIDE 0.9 % IV SOLN: INTRAVENOUS | Status: DC

## 2013-05-20 MED ORDER — MORPHINE SULFATE 2 MG/ML IJ SOLN
2.0000 mg | INTRAMUSCULAR | Status: DC | PRN
  Administered 2013-05-21 (×3): 2 mg via INTRAVENOUS
  Administered 2013-05-22: 4 mg via INTRAVENOUS
  Filled 2013-05-20: qty 1
  Filled 2013-05-20: qty 2
  Filled 2013-05-20 (×2): qty 1

## 2013-05-20 MED ORDER — VITAMIN C 500 MG PO TABS
500.0000 mg | ORAL_TABLET | Freq: Every day | ORAL | Status: DC
  Administered 2013-05-21 – 2013-05-22 (×2): 500 mg via ORAL
  Filled 2013-05-20 (×2): qty 1

## 2013-05-20 MED ORDER — LOSARTAN POTASSIUM 50 MG PO TABS
100.0000 mg | ORAL_TABLET | Freq: Every morning | ORAL | Status: DC
  Administered 2013-05-21: 100 mg via ORAL
  Filled 2013-05-20: qty 2

## 2013-05-20 MED ORDER — HYOSCYAMINE SULFATE 0.125 MG SL SUBL
0.1250 mg | SUBLINGUAL_TABLET | Freq: Three times a day (TID) | SUBLINGUAL | Status: DC | PRN
  Administered 2013-05-21 – 2013-05-22 (×2): 0.125 mg via ORAL
  Filled 2013-05-20 (×4): qty 1

## 2013-05-20 MED ORDER — AMLODIPINE BESYLATE 2.5 MG PO TABS
2.5000 mg | ORAL_TABLET | Freq: Every day | ORAL | Status: DC
  Administered 2013-05-21: 2.5 mg via ORAL
  Filled 2013-05-20: qty 1

## 2013-05-20 MED ORDER — NITROGLYCERIN 0.4 MG SL SUBL
0.4000 mg | SUBLINGUAL_TABLET | SUBLINGUAL | Status: DC | PRN
  Administered 2013-05-20: 0.4 mg via SUBLINGUAL

## 2013-05-20 MED ORDER — DIAZEPAM 2 MG PO TABS
2.0000 mg | ORAL_TABLET | Freq: Four times a day (QID) | ORAL | Status: DC | PRN
  Administered 2013-05-21: 2 mg via ORAL
  Filled 2013-05-20: qty 1

## 2013-05-20 MED ORDER — SIMVASTATIN 40 MG PO TABS
40.0000 mg | ORAL_TABLET | Freq: Every day | ORAL | Status: DC
  Administered 2013-05-21: 40 mg via ORAL
  Filled 2013-05-20 (×2): qty 1

## 2013-05-20 MED ORDER — FLUTICASONE PROPIONATE 50 MCG/ACT NA SUSP
2.0000 | Freq: Every day | NASAL | Status: DC
  Administered 2013-05-21 – 2013-05-22 (×2): 2 via NASAL
  Filled 2013-05-20: qty 16

## 2013-05-20 MED ORDER — ASPIRIN EC 81 MG PO TBEC
81.0000 mg | DELAYED_RELEASE_TABLET | Freq: Every day | ORAL | Status: DC
  Administered 2013-05-21 – 2013-05-22 (×2): 81 mg via ORAL
  Filled 2013-05-20 (×2): qty 1

## 2013-05-20 MED ORDER — GI COCKTAIL ~~LOC~~: 30.0000 mL | Freq: Once | ORAL | Status: DC

## 2013-05-20 MED ORDER — MORPHINE SULFATE 4 MG/ML IJ SOLN
4.0000 mg | Freq: Once | INTRAMUSCULAR | Status: AC
  Administered 2013-05-20: 4 mg via INTRAVENOUS
  Filled 2013-05-20: qty 1

## 2013-05-20 MED ORDER — ZOLPIDEM TARTRATE 5 MG PO TABS
5.0000 mg | ORAL_TABLET | Freq: Every evening | ORAL | Status: DC | PRN
  Administered 2013-05-21 (×2): 5 mg via ORAL
  Filled 2013-05-20 (×2): qty 1

## 2013-05-20 NOTE — ED Notes (Addendum)
At 12 pm pt was getting out of car, blacked out, and fell back, hitting her head on the cement, does not know how long.  States she lost control of bladder during syncopal episode.  Bystanders gave her water and crackers.  She drove herself home.  C/o headache (her normal headache - sleeps with ice pack at night) and pain to sacrum.  Takes asa only.  Hx of 2 strokes.  States blocked vessel in brain and is supposed to "seek medical attention immediately if sbp over 140".  Presently 160/87.  CBG 104

## 2013-05-20 NOTE — H&P (Addendum)
[facility] History and Physical  Kathryn [name] ZOX:[id number] DOB: [birth year] DOA: 05/20/2013  Referring physician: ED PCP: Emeterio Reeve, MD   Chief Complaint: Fall  HPI: Kathryn [name] is a 79 y.o. female who had a fall while standing today.  The patients history is very shifty and seems to change with each telling.  She claims at this time that there was no LOC associated with the fall, that she is falling at a rate of about once a month or so for the past couple of years.  Note that this does differ somewhat from what she told Dr. Ethelda Chick and BOTH histories are different from what she told her son.  Regardless, she did cause a mild compression fracture in the fall, there was loss of urine in the fall.  She denies that the falls occur when going from a seated to a standing position to me but does describe several other events which sound suspicious for this.  Overall history taking is very difficult and patient is a very poor historian.  Review of Systems: 12 systems reviewed and otherwise negative.   Past Medical History  Diagnosis Date  . Depression   . Hypertension   . High cholesterol   . Rectal prolapse   . Hx of adenomatous colonic polyps   . Internal hemorrhoids   . Hypothyroidism   . Fecal incontinence   . TIA (transient ischemic attack)   . Stroke , sept 1, 2010sept 12, 2011    x 2  . History of frequent urinary tract infections     recent  . Migraine   . Fatty liver 03/20/13   Past Surgical History  Procedure Laterality Date  . Colon surgery  2010, for prolapsed organs after hystecrtomy surgery  . Appendectomy  2008  . Abdominal hysterectomy  2010  . Cholecystectomy    . Lumbar laminectomy/decompression microdiscectomy N/A 01/07/2013    Procedure: LUMBAR LAMINECTOMY CENTRAL DECOMPRESSION L4-L5, BILATERAL FORAMENOTOMY L4,L5    ;  Surgeon: Jacki Cones, MD;  Location: [facility];  Service: Orthopedics;  Laterality: N/A;   Social History:  reports that she  has never smoked. She has never used smokeless tobacco. She reports that she does not drink alcohol or use illicit drugs.  Allergies  Allergen Reactions  . Augmentin [Amoxicillin-Pot Clavulanate] Swelling    Throat   . Citalopram Swelling    Throat and eyes   . Codeine Anaphylaxis  . Ibuprofen Swelling    Throat and eyes   . Latex Swelling and Rash    Swelling to troat  . Lipitor [Atorvastatin] Swelling    throat  . Septra [Bactrim] Anaphylaxis  . Keflet [Cephalexin] Diarrhea    Upset stomach  . Vicodin [Hydrocodone-Acetaminophen] Other (See Comments)    stroke  . Ciprofloxacin Nausea And Vomiting  . Depakene [Valproate Sodium] Hives    All over body   . Isoptin Sr [Verapamil Hcl Er] Cough  . Lisinopril Cough  . Miconazole Rash    Family History  Problem Relation Age of Onset  . Stroke Father   . Migraines Father   . CVA Father   . Heart attack Father   . Hypertension Maternal Aunt     x3  . Colon cancer Neg Hx   . CVA Mother     Prior to Admission medications   Medication Sig Start Date End Date Taking? Authorizing Provider  amLODipine (NORVASC) 2.5 MG tablet Take 2.5 mg by mouth daily.    Yes Historical  Provider, MD  aspirin EC 81 MG tablet Take 81 mg by mouth daily.   Yes Historical Provider, MD  clonazePAM (KLONOPIN) 1 MG tablet Take 0.5-1 mg by mouth at bedtime as needed for anxiety.  01/23/12  Yes Hart Carwin, MD  diazepam (VALIUM) 2 MG tablet Take 2 mg by mouth every 6 (six) hours as needed. For anxiety   Yes Historical Provider, MD  fluticasone (FLONASE) 50 MCG/ACT nasal spray Place 2 sprays into the nose daily.   Yes Historical Provider, MD  losartan (COZAAR) 100 MG tablet Take 100 mg by mouth every morning.   Yes Historical Provider, MD  pantoprazole (PROTONIX) 40 MG tablet Take 1 tablet (40 mg total) by mouth every morning. 04/18/13  Yes Governor Rooks, MD  PRESCRIPTION MEDICATION Pt gets allergy shots once weekly. Dr [place] Callas.   Yes Historical  Provider, MD  propranolol (INDERAL) 40 MG tablet Take 40 mg by mouth 3 (three) times daily.   Yes Historical Provider, MD  simvastatin (ZOCOR) 40 MG tablet Take 40 mg by mouth every morning.    Yes Historical Provider, MD  traMADol-acetaminophen (ULTRACET) 37.5-325 MG per tablet Take 0.5 tablets by mouth every 4 (four) hours as needed.  04/04/13  Yes Historical Provider, MD  vitamin C (ASCORBIC ACID) 500 MG tablet Take 500 mg by mouth daily.   Yes Historical Provider, MD  vitamin E 200 UNIT capsule Take 400 Units by mouth daily.    Yes Historical Provider, MD  zolpidem (AMBIEN CR) 12.5 MG CR tablet Take 12.5 mg by mouth at bedtime.   Yes Historical Provider, MD  hyoscyamine (LEVSIN SL) 0.125 MG SL tablet Take 1 tablet (0.125 mg total) by mouth every 8 (eight) hours as needed. For colon spasms 04/05/13   Hart Carwin, MD   Physical Exam: Filed Vitals:   05/20/13 2153  BP: 133/52  Pulse: 59  Temp: 98 F (36.7 C)  Resp: 16    General:  NAD, resting comfortably in bed Eyes: PEERLA EOMI ENT: mucous membranes moist Neck: supple w/o JVD Cardiovascular: RRR w/o MRG Respiratory: CTA B Abdomen: soft, mild epigastric tenderness with guarding, no rebound, mild diffuse tenderness without guarding, nd, bs+ Skin: no rash nor lesion Musculoskeletal: MAE, full ROM all 4 extremities Psychiatric: very tangential thinking Neurologic: AAOx3, grossly non-focal  Labs on Admission:  Basic Metabolic Panel:  Recent Labs Lab 05/20/13 1725  NA 125*  K 3.8  CL 89*  CO2 25  GLUCOSE 103*  BUN 13  CREATININE 0.76  CALCIUM 9.9   Liver Function Tests:  Recent Labs Lab 05/20/13 1725  AST 24  ALT 12  ALKPHOS 49  BILITOT 0.9  PROT 7.1  ALBUMIN 3.9   No results found for this basename: LIPASE, AMYLASE,  in the last 168 hours No results found for this basename: AMMONIA,  in the last 168 hours CBC:  Recent Labs Lab 05/20/13 1725  WBC 6.8  NEUTROABS 5.4  HGB 12.2  HCT 34.4*  MCV 89.1  PLT  169   Cardiac Enzymes:  Recent Labs Lab 05/20/13 1725  TROPONINI <0.30    BNP (last 3 results) No results found for this basename: PROBNP,  in the last 8760 hours CBG:  Recent Labs Lab 05/20/13 1637  GLUCAP 104*    Radiological Exams on Admission: Dg Lumbar Spine Complete  05/20/2013   CLINICAL DATA:  Pain post trauma  EXAM: LUMBAR SPINE - COMPLETE 4+ VIEW  COMPARISON:  Postmyelogram lumbar spine CT December 14, 2012  FINDINGS: Frontal, lateral, spot lumbosacral lateral, and bilateral oblique views were obtained. There is lumbar dextrorotoscoliosis. There is a anterior wedging of the L1 vertebral body, a finding not present previously. No retropulsion of bone is seen in this area. There is no other fracture. There is 10 mm of anterolisthesis of L4 on L5, stable. There is no new spondylolisthesis.  There is mild disc space narrowing at L4-5 and L5-S1. There is facet arthropathy at all levels bilaterally. There is atherosclerotic change in the aorta.  IMPRESSION: Anterior wedging of the L1 vertebral body, a finding not present previously. No other fracture. There is stable 10 mm of anterolisthesis of L4 on L5. No new spondylolisthesis. There is dextrorotoscoliosis. There is multilevel osteoarthritic change.   Electronically Signed   By: Bretta Bang   On: 05/20/2013 19:03    EKG: Independently reviewed.  Assessment/Plan Principal Problem:   Pre-syncope Active Problems:   HYPERTENSION   1. Fall due to pre-syncope vs syncope vs loss of balance - orthostatic vitals ordered, PT/OT eval ordered, given the history of loss of urine and PMH of stroke will also check EEG though she is stating now that there was no LOC with this fall.  2d echo ordered, tele monitor, and troponin also ordered.  Patient is very difficult to get history out of. 2. HTN - continue home meds 3. Abd pain - resolving without specific treatment, still doing cardiac rule out work up but most likely this is esophageal  spasm secondary to Morphine. 4. Hyponatremia - possibly dehydration, not on meds that can cause this, will treat with NS IV and see how she responds.   Code Status: Full Code (must indicate code status--if unknown or must be presumed, indicate so) Family Communication: Son at bedside (indicate person spoken with, if applicable, with phone number if by telephone) Disposition Plan: Admit to obs (indicate anticipated LOS)  Time spent: 70 min  GARDNER, JARED M. [facility] Pager [phone]  If 7PM-7AM, please contact night-coverage [email or web address] Password [email or web address] 05/20/2013, 11:02 PM

## 2013-05-20 NOTE — ED Provider Notes (Addendum)
CSN: [id number]     Arrival date & time 05/20/13  1612 History   First MD Initiated Contact with Patient 05/20/13 1706     Chief Complaint  Patient presents with  . Loss of Consciousness  . Dizziness   (Consider location/radiation/quality/duration/timing/severity/associated sxs/prior Treatment) HPI Patient suffered syncopal event while standing today. She states she has similar events approximately twice per week for the past 2 years. She denies headache she admits to low back pain since the event. Denies chest pain denies other complaint no loss of bladder or bowel control. She reports having been evaluated by cardiologist and by 2 neurologists for similar complaints, etiology is unclear. She's also been evaluated by gastroenterologist for chronic abdominal pain, etiology unclear. She is presently asymptomatic except for mild lightheadedness with standing and low back pain, nonradiating. No treatment prior to coming here. Past Medical History  Diagnosis Date  . Depression   . Hypertension   . High cholesterol   . Rectal prolapse   . Hx of adenomatous colonic polyps   . Internal hemorrhoids   . Hypothyroidism   . Fecal incontinence   . TIA (transient ischemic attack)   . Stroke , sept 1, 2010sept 12, 2011    x 2  . History of frequent urinary tract infections     recent  . Migraine   . Fatty liver 03/20/13   Past Surgical History  Procedure Laterality Date  . Colon surgery  2010, for prolapsed organs after hystecrtomy surgery  . Appendectomy  2008  . Abdominal hysterectomy  2010  . Cholecystectomy    . Lumbar laminectomy/decompression microdiscectomy N/A 01/07/2013    Procedure: LUMBAR LAMINECTOMY CENTRAL DECOMPRESSION L4-L5, BILATERAL FORAMENOTOMY L4,L5    ;  Surgeon: Jacki Cones, MD;  Location: [facility];  Service: Orthopedics;  Laterality: N/A;   Family History  Problem Relation Age of Onset  . Stroke Father   . Migraines Father   . CVA Father   . Heart attack Father   .  Hypertension Maternal Aunt     x3  . Colon cancer Neg Hx   . CVA Mother    History  Substance Use Topics  . Smoking status: Never Smoker   . Smokeless tobacco: Never Used  . Alcohol Use: No   OB History   Grav Para Term Preterm Abortions TAB SAB Ect Mult Living                 Review of Systems  Constitutional: Negative.   HENT: Negative.   Respiratory: Negative.   Cardiovascular: Negative.   Gastrointestinal: Negative.   Musculoskeletal: Positive for back pain.  Skin: Negative.   Neurological: Positive for light-headedness.  Psychiatric/Behavioral: Negative.   All other systems reviewed and are negative.    Allergies  Augmentin; Citalopram; Codeine; Ibuprofen; Latex; Lipitor; Septra; Keflet; Vicodin; Ciprofloxacin; Depakene; Isoptin sr; Lisinopril; and Miconazole  Home Medications   Current Outpatient Rx  Name  Route  Sig  Dispense  Refill  . amLODipine (NORVASC) 2.5 MG tablet   Oral   Take 2.5 mg by mouth daily.          Marland Kitchen aspirin EC 81 MG tablet   Oral   Take 81 mg by mouth daily.         . clonazePAM (KLONOPIN) 1 MG tablet   Oral   Take 0.5-1 mg by mouth at bedtime as needed for anxiety.          . diazepam (VALIUM) 2 MG tablet  Oral   Take 2 mg by mouth every 6 (six) hours as needed. For anxiety         . fluticasone (FLONASE) 50 MCG/ACT nasal spray   Nasal   Place 2 sprays into the nose daily.         Marland Kitchen losartan (COZAAR) 100 MG tablet   Oral   Take 100 mg by mouth every morning.         . pantoprazole (PROTONIX) 40 MG tablet   Oral   Take 1 tablet (40 mg total) by mouth every morning.   90 tablet   2   . PRESCRIPTION MEDICATION      Pt gets allergy shots once weekly. Dr [place] Callas.         . propranolol (INDERAL) 40 MG tablet   Oral   Take 40 mg by mouth 3 (three) times daily.         . simvastatin (ZOCOR) 40 MG tablet   Oral   Take 40 mg by mouth every morning.          . traMADol-acetaminophen (ULTRACET) 37.5-325 MG  per tablet   Oral   Take 0.5 tablets by mouth every 4 (four) hours as needed.          . vitamin C (ASCORBIC ACID) 500 MG tablet   Oral   Take 500 mg by mouth daily.         . vitamin E 200 UNIT capsule   Oral   Take 400 Units by mouth daily.          Marland Kitchen zolpidem (AMBIEN CR) 12.5 MG CR tablet   Oral   Take 12.5 mg by mouth at bedtime.         . hyoscyamine (LEVSIN SL) 0.125 MG SL tablet   Oral   Take 1 tablet (0.125 mg total) by mouth every 8 (eight) hours as needed. For colon spasms   30 tablet   1    BP 160/87  Pulse 72  Temp(Src) 97.9 F (36.6 C) (Oral)  Resp 16  SpO2 98% Physical Exam  Nursing note and vitals reviewed. Constitutional: She is oriented to person, place, and time. She appears well-developed and well-nourished.  HENT:  There is an abrasion at the tip of her tongue otherwise normocephalic atraumatic.  Eyes: Conjunctivae are normal. Pupils are equal, round, and reactive to light.  Neck: Neck supple. No tracheal deviation present. No thyromegaly present.  Cardiovascular: Normal rate and regular rhythm.   No murmur heard. Pulmonary/Chest: Effort normal and breath sounds normal.  Abdominal: Soft. Bowel sounds are normal. She exhibits no distension. There is no tenderness.  Musculoskeletal: Normal range of motion. She exhibits no edema and no tenderness.  Midline surgical scar lumbar area with corresponding tenderness cervical spine thoracic spine nontender. Pelvis stable nontender all 4 extremities no contusion abrasion or tenderness neurovascular intact  Neurological: She is alert and oriented to person, place, and time. No cranial nerve deficit. Coordination normal.  Gait normal not lightheaded on standing  Skin: Skin is warm and dry. No rash noted.  Psychiatric: She has a normal mood and affect.    Date: 05/20/2013  Rate: 75  Rhythm: normal sinus rhythm  QRS Axis: normal  Intervals: normal  ST/T Wave abnormalities: normal  Conduction  Disutrbances: none  Narrative Interpretation: unremarkable Unchanged from 01/08/13   ED Course  Procedures (including critical care time) Labs Review Labs Reviewed  GLUCOSE, CAPILLARY - Abnormal; Notable for the following:  Glucose-Capillary 104 (*)    All other components within normal limits  PROTIME-INR  APTT  CBC  DIFFERENTIAL  COMPREHENSIVE METABOLIC PANEL  TROPONIN I   Imaging Review No results found. 7:25 PM reports she has not have adequate pain relief after one dose of tramadol, morphine ordered  740 pm pt developed epigasrtic pain after morphine administered  Which was self-limiting, lasting approx 20 minutes Results for orders placed during the hospital encounter of 05/20/13  PROTIME-INR      Result Value Range   Prothrombin Time 13.1  11.6 - 15.2 seconds   INR 1.01  0.00 - 1.49  APTT      Result Value Range   aPTT 31  24 - 37 seconds  CBC      Result Value Range   WBC 6.8  4.0 - 10.5 K/uL   RBC 3.86 (*) 3.87 - 5.11 MIL/uL   Hemoglobin 12.2  12.0 - 15.0 g/dL   HCT 16.1 (*) 09.6 - 04.5 %   MCV 89.1  78.0 - 100.0 fL   MCH 31.6  26.0 - 34.0 pg   MCHC 35.5  30.0 - 36.0 g/dL   RDW 40.9  81.1 - 91.4 %   Platelets 169  150 - 400 K/uL  DIFFERENTIAL      Result Value Range   Neutrophils Relative % 80 (*) 43 - 77 %   Neutro Abs 5.4  1.7 - 7.7 K/uL   Lymphocytes Relative 11 (*) 12 - 46 %   Lymphs Abs 0.8  0.7 - 4.0 K/uL   Monocytes Relative 8  3 - 12 %   Monocytes Absolute 0.6  0.1 - 1.0 K/uL   Eosinophils Relative 1  0 - 5 %   Eosinophils Absolute 0.1  0.0 - 0.7 K/uL   Basophils Relative 0  0 - 1 %   Basophils Absolute 0.0  0.0 - 0.1 K/uL  COMPREHENSIVE METABOLIC PANEL      Result Value Range   Sodium 125 (*) 135 - 145 mEq/L   Potassium 3.8  3.5 - 5.1 mEq/L   Chloride 89 (*) 96 - 112 mEq/L   CO2 25  19 - 32 mEq/L   Glucose, Bld 103 (*) 70 - 99 mg/dL   BUN 13  6 - 23 mg/dL   Creatinine, Ser 7.82  0.50 - 1.10 mg/dL   Calcium 9.9  8.4 - 95.6 mg/dL    Total Protein 7.1  6.0 - 8.3 g/dL   Albumin 3.9  3.5 - 5.2 g/dL   AST 24  0 - 37 U/L   ALT 12  0 - 35 U/L   Alkaline Phosphatase 49  39 - 117 U/L   Total Bilirubin 0.9  0.3 - 1.2 mg/dL   GFR calc non Af Amer 78 (*) >90 mL/min   GFR calc Af Amer 90 (*) >90 mL/min  TROPONIN I      Result Value Range   Troponin I <0.30  <0.30 ng/mL  GLUCOSE, CAPILLARY      Result Value Range   Glucose-Capillary 104 (*) 70 - 99 mg/dL  POCT I-STAT TROPONIN I      Result Value Range   Troponin i, poc 0.00  0.00 - 0.08 ng/mL   Comment 3            Dg Lumbar Spine Complete  05/20/2013   CLINICAL DATA:  Pain post trauma  EXAM: LUMBAR SPINE - COMPLETE 4+ VIEW  COMPARISON:  Postmyelogram lumbar spine CT  December 14, 2012  FINDINGS: Frontal, lateral, spot lumbosacral lateral, and bilateral oblique views were obtained. There is lumbar dextrorotoscoliosis. There is a anterior wedging of the L1 vertebral body, a finding not present previously. No retropulsion of bone is seen in this area. There is no other fracture. There is 10 mm of anterolisthesis of L4 on L5, stable. There is no new spondylolisthesis.  There is mild disc space narrowing at L4-5 and L5-S1. There is facet arthropathy at all levels bilaterally. There is atherosclerotic change in the aorta.  IMPRESSION: Anterior wedging of the L1 vertebral body, a finding not present previously. No other fracture. There is stable 10 mm of anterolisthesis of L4 on L5. No new spondylolisthesis. There is dextrorotoscoliosis. There is multilevel osteoarthritic change.   Electronically Signed   By: Bretta Bang   On: 05/20/2013 19:03    MDM  No diagnosis found. Spoke with Dr. Julian Reil. Plan 23 hour observation, telemtery iv hydration corrwect hyponatremia, pain control Dx #1 syncope #2 compression Fracture of L1 #3 hyponattremia    Doug Sou, MD 05/20/13 2135  Doug Sou, MD 05/20/13 2137

## 2013-05-20 NOTE — ED Provider Notes (Deleted)
CSN: [id number]     Arrival date & time 05/20/13  1612 History   First MD Initiated Contact with Patient 05/20/13 1706     Chief Complaint  Patient presents with  . Loss of Consciousness  . Dizziness   (Consider location/radiation/quality/duration/timing/severity/associated sxs/prior Treatment) HPI  Past Medical History  Diagnosis Date  . Depression   . Hypertension   . High cholesterol   . Rectal prolapse   . Hx of adenomatous colonic polyps   . Internal hemorrhoids   . Hypothyroidism   . Fecal incontinence   . TIA (transient ischemic attack)   . Stroke , sept 1, 2010sept 12, 2011    x 2  . History of frequent urinary tract infections     recent  . Migraine   . Fatty liver 03/20/13   Past Surgical History  Procedure Laterality Date  . Colon surgery  2010, for prolapsed organs after hystecrtomy surgery  . Appendectomy  2008  . Abdominal hysterectomy  2010  . Cholecystectomy    . Lumbar laminectomy/decompression microdiscectomy N/A 01/07/2013    Procedure: LUMBAR LAMINECTOMY CENTRAL DECOMPRESSION L4-L5, BILATERAL FORAMENOTOMY L4,L5    ;  Surgeon: Jacki Cones, MD;  Location: [facility];  Service: Orthopedics;  Laterality: N/A;   Family History  Problem Relation Age of Onset  . Stroke Father   . Migraines Father   . CVA Father   . Heart attack Father   . Hypertension Maternal Aunt     x3  . Colon cancer Neg Hx   . CVA Mother    History  Substance Use Topics  . Smoking status: Never Smoker   . Smokeless tobacco: Never Used  . Alcohol Use: No   OB History   Grav Para Term Preterm Abortions TAB SAB Ect Mult Living                 Review of Systems  Allergies  Augmentin; Citalopram; Codeine; Ibuprofen; Latex; Lipitor; Septra; Keflet; Vicodin; Ciprofloxacin; Depakene; Isoptin sr; Lisinopril; and Miconazole  Home Medications   Current Outpatient Rx  Name  Route  Sig  Dispense  Refill  . amLODipine (NORVASC) 2.5 MG tablet   Oral   Take 2.5 mg by mouth daily.           Marland Kitchen aspirin EC 81 MG tablet   Oral   Take 81 mg by mouth daily.         . clonazePAM (KLONOPIN) 1 MG tablet   Oral   Take 0.5-1 mg by mouth at bedtime as needed for anxiety.          . diazepam (VALIUM) 2 MG tablet   Oral   Take 2 mg by mouth every 6 (six) hours as needed. For anxiety         . fluticasone (FLONASE) 50 MCG/ACT nasal spray   Nasal   Place 2 sprays into the nose daily.         Marland Kitchen losartan (COZAAR) 100 MG tablet   Oral   Take 100 mg by mouth every morning.         . pantoprazole (PROTONIX) 40 MG tablet   Oral   Take 1 tablet (40 mg total) by mouth every morning.   90 tablet   2   . PRESCRIPTION MEDICATION      Pt gets allergy shots once weekly. Dr [place] Callas.         . propranolol (INDERAL) 40 MG tablet   Oral   Take  40 mg by mouth 3 (three) times daily.         . simvastatin (ZOCOR) 40 MG tablet   Oral   Take 40 mg by mouth every morning.          . traMADol-acetaminophen (ULTRACET) 37.5-325 MG per tablet   Oral   Take 0.5 tablets by mouth every 4 (four) hours as needed.          . vitamin C (ASCORBIC ACID) 500 MG tablet   Oral   Take 500 mg by mouth daily.         . vitamin E 200 UNIT capsule   Oral   Take 400 Units by mouth daily.          Marland Kitchen zolpidem (AMBIEN CR) 12.5 MG CR tablet   Oral   Take 12.5 mg by mouth at bedtime.         . hyoscyamine (LEVSIN SL) 0.125 MG SL tablet   Oral   Take 1 tablet (0.125 mg total) by mouth every 8 (eight) hours as needed. For colon spasms   30 tablet   1    BP 163/70  Pulse 72  Temp(Src) 97.9 F (36.6 C) (Oral)  Resp 23  SpO2 96% Physical Exam  ED Course  Procedures (including critical care time) Labs Review Labs Reviewed  CBC - Abnormal; Notable for the following:    RBC 3.86 (*)    HCT 34.4 (*)    All other components within normal limits  DIFFERENTIAL - Abnormal; Notable for the following:    Neutrophils Relative % 80 (*)    Lymphocytes Relative 11 (*)     All other components within normal limits  COMPREHENSIVE METABOLIC PANEL - Abnormal; Notable for the following:    Sodium 125 (*)    Chloride 89 (*)    Glucose, Bld 103 (*)    GFR calc non Af Amer 78 (*)    GFR calc Af Amer 90 (*)    All other components within normal limits  GLUCOSE, CAPILLARY - Abnormal; Notable for the following:    Glucose-Capillary 104 (*)    All other components within normal limits  PROTIME-INR  APTT  TROPONIN I  POCT I-STAT TROPONIN I   Imaging Review Dg Lumbar Spine Complete  05/20/2013   CLINICAL DATA:  Pain post trauma  EXAM: LUMBAR SPINE - COMPLETE 4+ VIEW  COMPARISON:  Postmyelogram lumbar spine CT December 14, 2012  FINDINGS: Frontal, lateral, spot lumbosacral lateral, and bilateral oblique views were obtained. There is lumbar dextrorotoscoliosis. There is a anterior wedging of the L1 vertebral body, a finding not present previously. No retropulsion of bone is seen in this area. There is no other fracture. There is 10 mm of anterolisthesis of L4 on L5, stable. There is no new spondylolisthesis.  There is mild disc space narrowing at L4-5 and L5-S1. There is facet arthropathy at all levels bilaterally. There is atherosclerotic change in the aorta.  IMPRESSION: Anterior wedging of the L1 vertebral body, a finding not present previously. No other fracture. There is stable 10 mm of anterolisthesis of L4 on L5. No new spondylolisthesis. There is dextrorotoscoliosis. There is multilevel osteoarthritic change.   Electronically Signed   By: Bretta Bang   On: 05/20/2013 19:03   X-ray viewed by me MDM  No diagnosis found. Spoke with Dr. Julian Reil In light of hyponatremia will arrange for inpatient stay 23 hour observation telemetry intravenous hydration, pain control Diagnosis #1 syncope #2 hyponatremia #3  L1 compression fracture   Doug Sou, MD 05/20/13 2020

## 2013-05-20 NOTE — ED Notes (Signed)
Spoke with pharmacy, and was told that there were medication contraindications due to additives/preservatives in the medications. Pharmacist believes that it is ok to give the GI cocktail to the pt.

## 2013-05-20 NOTE — ED Notes (Addendum)
Pt states she has these "spells" several times a year.  Pt states she fell backwards at bank and hit back of head on concrete.  Pt drove herself home from bank.  Pt's son drove pt to ED.  Pt had back surgery in May and is c/o back pain.  Pt bit tip of tongue when she fell.  Pt states she has migraine headaches all the time and has a headache at this time.

## 2013-05-21 ENCOUNTER — Observation Stay (HOSPITAL_COMMUNITY): Payer: Medicare Other

## 2013-05-21 DIAGNOSIS — Z9181 History of falling: Secondary | ICD-10-CM

## 2013-05-21 DIAGNOSIS — I10 Essential (primary) hypertension: Secondary | ICD-10-CM

## 2013-05-21 DIAGNOSIS — R296 Repeated falls: Secondary | ICD-10-CM

## 2013-05-21 LAB — BASIC METABOLIC PANEL
BUN: 9 mg/dL (ref 6–23)
CO2: 25 mEq/L (ref 19–32)
Chloride: 94 mEq/L — ABNORMAL LOW (ref 96–112)
Creatinine, Ser: 0.67 mg/dL (ref 0.50–1.10)
GFR calc Af Amer: >90 mL/min (ref >90)
GFR calc non Af Amer: 81 mL/min — ABNORMAL LOW (ref >90)
Potassium: 3.7 mEq/L (ref 3.5–5.1)

## 2013-05-21 LAB — CBC
MCHC: 34.9 g/dL (ref 30.0–36.0)
MCV: 88.8 fL (ref 78.0–100.0)
Platelets: 162 10*3/uL (ref 150–400)
RBC: 3.58 MIL/uL — ABNORMAL LOW (ref 3.87–5.11)
RDW: 12.8 % (ref 11.5–15.5)
WBC: 3.9 10*3/uL — ABNORMAL LOW (ref 4.0–10.5)

## 2013-05-21 LAB — TROPONIN I
Troponin I: <0.3 ng/mL (ref <0.30)
Troponin I: <0.3 ng/mL (ref <0.30)

## 2013-05-21 MED ORDER — CLONAZEPAM 0.5 MG PO TABS
0.5000 mg | ORAL_TABLET | Freq: Every day | ORAL | Status: DC
  Administered 2013-05-21: 1 mg via ORAL
  Filled 2013-05-21: qty 2

## 2013-05-21 MED ORDER — ENSURE COMPLETE PO LIQD
237.0000 mL | Freq: Two times a day (BID) | ORAL | Status: DC
  Administered 2013-05-21: 237 mL via ORAL

## 2013-05-21 MED ORDER — PROPRANOLOL HCL 40 MG PO TABS
40.0000 mg | ORAL_TABLET | Freq: Every day | ORAL | Status: DC
  Filled 2013-05-21: qty 1

## 2013-05-21 MED ORDER — INFLUENZA VAC SPLIT QUAD 0.5 ML IM SUSP
0.5000 mL | INTRAMUSCULAR | Status: DC
  Filled 2013-05-21: qty 0.5

## 2013-05-21 NOTE — Progress Notes (Signed)
[facility] PROGRESS NOTE  Kathryn [name] VOZ:[id number] DOB: 24-Jun-1931 DOA: 05/20/2013 PCP: Emeterio Reeve, MD  Assessment/Plan: 1-Syncope, vs near syncope. Patient had urinary incontinence and bite her tongue during episode. EEG pending. No imaging ordered on admission. I will get MRI brain. I have consulted neurology. Troponin time 3 negative. Hyponatremia improved question component dehydration. Likely related to  Patient orthostatic, orthostatic by vitals. I will hold BP medications. ECHO pending.   2-Orthostatic: Hold BP medications. Continue with IV fluids. Repeat vital in am. Propranolol change to daily.   3-Compression fracture L-1: pain management: PT consult. Pain management.  4-HTN: hold multiple BP medications.  5-Hyponatremia: improved with IV fluids. Increase from 125 to 130.  6-History of hypothyroidism: check TSH.   Code Status: full Family Communication: care discussed with patient.  Disposition Plan: to be determine   Consultants:  Neurology  Procedures:  EEG  Antibiotics:  none  HPI/Subjective: Complaining of lower back pain. No chest pain this morning. [place] the [place] when she fell backward. She bite her tongue and had urinary incontinence.   Objective: Filed Vitals:   05/21/13 0800  BP: 142/64  Pulse: 62  Temp: 98.6 F (37 C)  Resp: 18    Intake/Output Summary (Last 24 hours) at 05/21/13 1026 Last data filed at 05/21/13 0840  Gross per 24 hour  Intake    600 ml  Output    900 ml  Net   -300 ml   Filed Weights   05/20/13 2153 05/21/13 0401  Weight: 61.417 kg (135 lb 6.4 oz) 61.417 kg (135 lb 6.4 oz)    Exam:   General:  No distress.   Cardiovascular: S 1, S 2 RRR  Respiratory: CTA  Abdomen: BS present, soft, Nt  Musculoskeletal: trace edema.   Data Reviewed: Basic Metabolic Panel:  Recent Labs Lab 05/20/13 1725 05/21/13 0530  NA 125* 130*  K 3.8 3.7  CL 89* 94*  CO2 25 25  GLUCOSE 103* 97  BUN 13 9   CREATININE 0.76 0.67  CALCIUM 9.9 9.7   Liver Function Tests:  Recent Labs Lab 05/20/13 1725  AST 24  ALT 12  ALKPHOS 49  BILITOT 0.9  PROT 7.1  ALBUMIN 3.9   No results found for this basename: LIPASE, AMYLASE,  in the last 168 hours No results found for this basename: AMMONIA,  in the last 168 hours CBC:  Recent Labs Lab 05/20/13 1725 05/21/13 0530  WBC 6.8 3.9*  NEUTROABS 5.4  --   HGB 12.2 11.1*  HCT 34.4* 31.8*  MCV 89.1 88.8  PLT 169 162   Cardiac Enzymes:  Recent Labs Lab 05/20/13 1725 05/21/13 0020 05/21/13 0513  TROPONINI <0.30 <0.30 <0.30   BNP (last 3 results) No results found for this basename: PROBNP,  in the last 8760 hours CBG:  Recent Labs Lab 05/20/13 1637  GLUCAP 104*    No results found for this or any previous visit (from the past 240 hour(s)).   Studies: Dg Lumbar Spine Complete  05/20/2013   CLINICAL DATA:  Pain post trauma  EXAM: LUMBAR SPINE - COMPLETE 4+ VIEW  COMPARISON:  Postmyelogram lumbar spine CT December 14, 2012  FINDINGS: Frontal, lateral, spot lumbosacral lateral, and bilateral oblique views were obtained. There is lumbar dextrorotoscoliosis. There is a anterior wedging of the L1 vertebral body, a finding not present previously. No retropulsion of bone is seen in this area. There is no other fracture. There is 10 mm of anterolisthesis of  L4 on L5, stable. There is no new spondylolisthesis.  There is mild disc space narrowing at L4-5 and L5-S1. There is facet arthropathy at all levels bilaterally. There is atherosclerotic change in the aorta.  IMPRESSION: Anterior wedging of the L1 vertebral body, a finding not present previously. No other fracture. There is stable 10 mm of anterolisthesis of L4 on L5. No new spondylolisthesis. There is dextrorotoscoliosis. There is multilevel osteoarthritic change.   Electronically Signed   By: Bretta Bang   On: 05/20/2013 19:03    Scheduled Meds: . amLODipine  2.5 mg Oral Daily  .  aspirin EC  81 mg Oral Daily  . fluticasone  2 spray Each Nare Daily  . heparin  5,000 Units Subcutaneous Q8H  . [START ON 05/22/2013] influenza vac split quadrivalent PF  0.5 mL Intramuscular Tomorrow-1000  . losartan  100 mg Oral q morning - 10a  . pantoprazole  40 mg Oral q morning - 10a  . propranolol  40 mg Oral Q8H  . simvastatin  40 mg Oral q1800  . sodium chloride  3 mL Intravenous Q12H  . vitamin C  500 mg Oral Daily   Continuous Infusions: . sodium chloride 125 mL/hr at 05/21/13 0023    Principal Problem:   Pre-syncope Active Problems:   HYPERTENSION    Time spent: 25 minutes.     REGALADO,BELKYS  [facility] Pager [phone]. If 7PM-7AM, please contact night-coverage at [email or web address], password [facility] 05/21/2013, 10:26 AM  LOS: 1 day

## 2013-05-21 NOTE — Evaluation (Signed)
Physical Therapy Evaluation Patient Details Name: Kathryn [name] MRN: [id number] DOB: 12-20-1930 Today's Date: 05/21/2013 Time: 5621-3086 PT Time Calculation (min): 29 min  PT Assessment / Plan / Recommendation History of Present Illness  Adm. for syncope/near syncope and fall with L1 compression fracture. Orders are for up ad lib.   Clinical Impression  Presents with below impairments impacting independence and safety with mobility. Will benefit physical therapy in the acute setting to maximize strength and safety for d/c home with limited support. Recommend pt use RW as she was much safer using this device however she reports that if she has to pay for it she doesn't want it. Patient would also benefit from HHPT f/u for continued therapy concerning her balance.     PT Assessment  Patient needs continued PT services    Follow Up Recommendations  Home health PT;Supervision for mobility/OOB    Does the patient have the potential to tolerate intense rehabilitation      Barriers to Discharge Decreased caregiver support      Equipment Recommendations  Rolling walker with 5" wheels    Recommendations for Other Services     Frequency Min 3X/week    Precautions / Restrictions Precautions Precautions: Fall Restrictions Weight Bearing Restrictions: No   Pertinent Vitals/Pain Reports moderate lower back pain, repositioned for comfort      Mobility  Bed Mobility Bed Mobility: Not assessed Transfers Transfers: Sit to Stand;Stand to Sit Sit to Stand: 5: Supervision;With upper extremity assist;From bed Stand to Sit: 5: Supervision;With upper extremity assist;To bed Details for Transfer Assistance: supervision for safety, slower moving because of back pain Ambulation/Gait Ambulation/Gait Assistance: 4: Min guard Ambulation Distance (Feet): 300 Feet Assistive device: Rolling walker;None Ambulation/Gait Assistance Details: 150 ft without AD, 150 ft with RW; closer gaurding for  ambulation without AD as pt moving slower and more gaurded with shorter steps; verbal and tactile cues initially for safe technique and sequencing with RW progressing to only verbal cues, speed improved and pt reported confidence improved with use of RW however does not want to use the walker because it "gets in her way" Gait Pattern: Step-through pattern;Decreased stride length Gait velocity: 1.17 ft/sec with RW; evidence suggests gait speed less than 1.8 ft/sec is indicative of recurrent risk for falls Stairs: No    Exercises     PT Diagnosis: Generalized weakness;Difficulty walking;Acute pain  PT Problem List: Decreased strength;Decreased activity tolerance;Decreased balance;Decreased knowledge of use of DME;Decreased safety awareness;Pain PT Treatment Interventions: DME instruction;Gait training;Functional mobility training;Therapeutic activities;Balance training;Neuromuscular re-education;Patient/family education     PT Goals(Current goals can be found in the care plan section) Acute Rehab PT Goals Patient Stated Goal: home PT Goal Formulation: With patient Time For Goal Achievement: 05/28/13 Potential to Achieve Goals: Good  Visit Information  Last PT Received On: 05/21/13 Assistance Needed: +1 History of Present Illness: Adm. for syncope/near syncope and fall with L1 compression fracture. Orders are for up ad lib.        Prior Functioning  Home Living Family/patient expects to be discharged to:: Private residence Living Arrangements: Alone Available Help at Discharge: Family;Neighbor;Available PRN/intermittently Type of Home: House Home Access: Level entry Home Layout: One level Home Equipment: None Prior Function Level of Independence: Independent Communication Communication: No difficulties    Cognition  Cognition Arousal/Alertness: Awake/alert Behavior During Therapy: WFL for tasks assessed/performed Overall Cognitive Status: Within Functional Limits for tasks  assessed Area of Impairment: Attention;Safety/judgement Current Attention Level: Selective Safety/Judgement: Decreased awareness of safety General Comments: easily distracted,  tangential, needs redirection to education, decreased insight into safety    Extremity/Trunk Assessment Upper Extremity Assessment Upper Extremity Assessment: Defer to OT evaluation Lower Extremity Assessment Lower Extremity Assessment: Generalized weakness   Balance Balance Balance Assessed: Yes Static Standing Balance Static Standing - Balance Support: No upper extremity supported Single Leg Stance - Right Leg: 1 Single Leg Stance - Left Leg: 1 Rhomberg - Eyes Opened: 60 (with SBA, increased sway and ankle instability, no LOB) Rhomberg - Eyes Closed: 27 (mingaurdA, increased sway and ankle instability)  End of Session PT - End of Session Equipment Utilized During Treatment: Gait belt Activity Tolerance: Patient tolerated treatment well Patient left: in bed;with call bell/phone within reach;with bed alarm set Nurse Communication: Mobility status  GP     Ludger Nutting 05/21/2013, 11:37 AM

## 2013-05-21 NOTE — Progress Notes (Signed)
EEG completed; results pending.    

## 2013-05-21 NOTE — Procedures (Signed)
ELECTROENCEPHALOGRAM REPORT   Patient: Kathryn [name]       Room #: 4V40 EEG No. ID: [id number] Age: 79 y.o.        Sex: female Referring Physician: Regalado Report Date:  05/21/2013        Interpreting Physician: Aline Brochure  History: Kathryn [name] is an 79 y.o. female admitted for evaluation of recurrent falls. Patient denies loss of consciousness associated with the falls. History of these events is somewhat sketchy suspect.  Indications for study:  Rule out seizure disorder.  Technique: This is an 18 channel routine scalp EEG performed at the bedside with bipolar and monopolar montages arranged in accordance to the international 10/20 system of electrode placement.   Description: This EEG was recorded during wakefulness. Background activity consists of 10 Hz symmetrical alpha rhythm which attenuated well with eye opening. Photic stimulation was not performed. Hyperventilation was not performed. No epileptiform discharges were recorded. There was no abnormal slowing seen.  Interpretation: This is a normal EEG recording during wakefulness.   Venetia Maxon M.D. [facility] [phone]

## 2013-05-21 NOTE — Consult Note (Signed)
Reason for Consult: Recurrent falls.  HPI:                                                                                                                                          Kathryn [name] is an 79 y.o. female history of hypertension, hypercholesterolemia, TIA and stroke who has been experiencing recurrent falls for about 2 years. Patient indicates she always falls backwards. She gets no warning prior to falling. She's had no serious injury. The last fall occurred yesterday. She denies presyncopal symptoms. There's been no associated loss of consciousness with her falls. She has assistive devices but refuses to use them. Patient lives alone. CT scan of her head showed no acute intracranial abnormality. She had an EEG earlier today and results are pending. There was no head injury with a fall yesterday, nor with any of her previous falls.  Past Medical History  Diagnosis Date  . Depression   . Hypertension   . High cholesterol   . Rectal prolapse   . Hx of adenomatous colonic polyps   . Internal hemorrhoids   . Hypothyroidism   . Fecal incontinence   . TIA (transient ischemic attack)   . Stroke , sept 1, 2010sept 12, 2011    x 2  . History of frequent urinary tract infections     recent  . Migraine   . Fatty liver 03/20/13    Past Surgical History  Procedure Laterality Date  . Colon surgery  2010, for prolapsed organs after hystecrtomy surgery  . Appendectomy  2008  . Abdominal hysterectomy  2010  . Cholecystectomy    . Lumbar laminectomy/decompression microdiscectomy N/A 01/07/2013    Procedure: LUMBAR LAMINECTOMY CENTRAL DECOMPRESSION L4-L5, BILATERAL FORAMENOTOMY L4,L5    ;  Surgeon: Jacki Cones, MD;  Location: [facility];  Service: Orthopedics;  Laterality: N/A;    Family History  Problem Relation Age of Onset  . Stroke Father   . Migraines Father   . CVA Father   . Heart attack Father   . Hypertension Maternal Aunt     x3  . Colon cancer Neg Hx   . CVA Mother      Social History:  reports that she has never smoked. She has never used smokeless tobacco. She reports that she does not drink alcohol or use illicit drugs.  Allergies  Allergen Reactions  . Augmentin [Amoxicillin-Pot Clavulanate] Swelling    Throat   . Citalopram Swelling    Throat and eyes   . Codeine Anaphylaxis  . Ibuprofen Swelling    Throat and eyes   . Latex Swelling and Rash    Swelling to troat  . Lipitor [Atorvastatin] Swelling    throat  . Septra [Bactrim] Anaphylaxis  . Keflet [Cephalexin] Diarrhea    Upset stomach  . Vicodin [Hydrocodone-Acetaminophen] Other (See Comments)    stroke  . Ciprofloxacin Nausea And  Vomiting  . Depakene [Valproate Sodium] Hives    All over body   . Isoptin Sr [Verapamil Hcl Er] Cough  . Lisinopril Cough  . Miconazole Rash    MEDICATIONS:                                                                                                                     I have reviewed the patient's current medications.   ROS:                                                                                                                                       History obtained from child and the patient  General ROS: negative for - chills, fatigue, fever, night sweats, weight gain or weight loss Psychological ROS: negative for - behavioral disorder, hallucinations, memory difficulties, mood swings or suicidal ideation Ophthalmic ROS: negative for - blurry vision, double vision, eye pain or loss of vision ENT ROS: negative for - epistaxis, nasal discharge, oral lesions, sore throat, tinnitus or vertigo Allergy and Immunology ROS: negative for - hives or itchy/watery eyes Hematological and Lymphatic ROS: negative for - bleeding problems, bruising or swollen lymph nodes Endocrine ROS: negative for - galactorrhea, hair pattern changes, polydipsia/polyuria or temperature intolerance Respiratory ROS: negative for - cough, hemoptysis, shortness of  breath or wheezing Cardiovascular ROS: negative for - chest pain, dyspnea on exertion, edema or irregular heartbeat Gastrointestinal ROS: negative for - abdominal pain, diarrhea, hematemesis, nausea/vomiting or stool incontinence Genito-Urinary ROS: negative for - dysuria, hematuria, incontinence or urinary frequency/urgency Musculoskeletal ROS: negative for - joint swelling or muscular weakness Neurological ROS: as noted in HPI Dermatological ROS: negative for rash and skin lesion changes   Blood pressure 146/59, pulse 66, temperature 98.7 F (37.1 C), temperature source Oral, resp. rate 16, height 5\' 3"  (1.6 m), weight 61.417 kg (135 lb 6.4 oz), SpO2 98.00%.   Neurologic Examination:  Mental Status: Alert, oriented, thought content appropriate.  Speech fluent without evidence of aphasia. Able to follow commands without difficulty. Cranial Nerves: II-Visual fields were normal. III/IV/VI-Pupils were equal and reacted. Extraocular movements were full and conjugate. Moderate intention tremor of the hands noted; no tremor at rest. Muscle tone was normal throughout. V/VII-no facial numbness and no facial weakness. VIII-normal. X-normal speech and symmetrical palatal movement. Motor: 5/5 bilaterally with normal tone and bulk Sensory: Normal throughout. Deep Tendon Reflexes: 2+ and symmetric. Plantars: Flexor bilaterally Cerebellar: Normal finger-to-nose testing. Carotid auscultation: Normal  Lab Results  Component Value Date/Time   CHOL  Value: 201        ATP III CLASSIFICATION:  <200     mg/dL   Desirable  454-098  mg/dL   Borderline High  >=119    mg/dL   High       * 09/05/7827  8:01 PM    Results for orders placed during the hospital encounter of 05/20/13 (from the past 48 hour(s))  GLUCOSE, CAPILLARY     Status: Abnormal   Collection Time    05/20/13  4:37 PM      Result Value Range    Glucose-Capillary 104 (*) 70 - 99 mg/dL  PROTIME-INR     Status: None   Collection Time    05/20/13  5:25 PM      Result Value Range   Prothrombin Time 13.1  11.6 - 15.2 seconds   INR 1.01  0.00 - 1.49  APTT     Status: None   Collection Time    05/20/13  5:25 PM      Result Value Range   aPTT 31  24 - 37 seconds  CBC     Status: Abnormal   Collection Time    05/20/13  5:25 PM      Result Value Range   WBC 6.8  4.0 - 10.5 K/uL   RBC 3.86 (*) 3.87 - 5.11 MIL/uL   Hemoglobin 12.2  12.0 - 15.0 g/dL   HCT 56.2 (*) 13.0 - 86.5 %   MCV 89.1  78.0 - 100.0 fL   MCH 31.6  26.0 - 34.0 pg   MCHC 35.5  30.0 - 36.0 g/dL   RDW 78.4  69.6 - 29.5 %   Platelets 169  150 - 400 K/uL  DIFFERENTIAL     Status: Abnormal   Collection Time    05/20/13  5:25 PM      Result Value Range   Neutrophils Relative % 80 (*) 43 - 77 %   Neutro Abs 5.4  1.7 - 7.7 K/uL   Lymphocytes Relative 11 (*) 12 - 46 %   Lymphs Abs 0.8  0.7 - 4.0 K/uL   Monocytes Relative 8  3 - 12 %   Monocytes Absolute 0.6  0.1 - 1.0 K/uL   Eosinophils Relative 1  0 - 5 %   Eosinophils Absolute 0.1  0.0 - 0.7 K/uL   Basophils Relative 0  0 - 1 %   Basophils Absolute 0.0  0.0 - 0.1 K/uL  COMPREHENSIVE METABOLIC PANEL     Status: Abnormal   Collection Time    05/20/13  5:25 PM      Result Value Range   Sodium 125 (*) 135 - 145 mEq/L   Potassium 3.8  3.5 - 5.1 mEq/L   Chloride 89 (*) 96 - 112 mEq/L   CO2 25  19 - 32 mEq/L   Glucose, Bld 103 (*) 70 -  99 mg/dL   BUN 13  6 - 23 mg/dL   Creatinine, Ser 1.47  0.50 - 1.10 mg/dL   Calcium 9.9  8.4 - 82.9 mg/dL   Total Protein 7.1  6.0 - 8.3 g/dL   Albumin 3.9  3.5 - 5.2 g/dL   AST 24  0 - 37 U/L   ALT 12  0 - 35 U/L   Alkaline Phosphatase 49  39 - 117 U/L   Total Bilirubin 0.9  0.3 - 1.2 mg/dL   GFR calc non Af Amer 78 (*) >90 mL/min   GFR calc Af Amer 90 (*) >90 mL/min   Comment: (NOTE)     The eGFR has been calculated using the CKD EPI equation.     This calculation has  not been validated in all clinical situations.     eGFR's persistently <90 mL/min signify possible Chronic Kidney     Disease.  TROPONIN I     Status: None   Collection Time    05/20/13  5:25 PM      Result Value Range   Troponin I <0.30  <0.30 ng/mL   Comment:            Due to the release kinetics of cTnI,     a negative result within the first hours     of the onset of symptoms does not rule out     myocardial infarction with certainty.     If myocardial infarction is still suspected,     repeat the test at appropriate intervals.  POCT I-STAT TROPONIN I     Status: None   Collection Time    05/20/13  5:33 PM      Result Value Range   Troponin i, poc 0.00  0.00 - 0.08 ng/mL   Comment 3            Comment: Due to the release kinetics of cTnI,     a negative result within the first hours     of the onset of symptoms does not rule out     myocardial infarction with certainty.     If myocardial infarction is still suspected,     repeat the test at appropriate intervals.  URINALYSIS, ROUTINE W REFLEX MICROSCOPIC     Status: None   Collection Time    05/20/13  9:57 PM      Result Value Range   Color, Urine YELLOW  YELLOW   APPearance CLEAR  CLEAR   Specific Gravity, Urine 1.006  1.005 - 1.030   pH 7.0  5.0 - 8.0   Glucose, UA NEGATIVE  NEGATIVE mg/dL   Hgb urine dipstick NEGATIVE  NEGATIVE   Bilirubin Urine NEGATIVE  NEGATIVE   Ketones, ur NEGATIVE  NEGATIVE mg/dL   Protein, ur NEGATIVE  NEGATIVE mg/dL   Urobilinogen, UA 0.2  0.0 - 1.0 mg/dL   Nitrite NEGATIVE  NEGATIVE   Leukocytes, UA NEGATIVE  NEGATIVE   Comment: MICROSCOPIC NOT DONE ON URINES WITH NEGATIVE PROTEIN, BLOOD, LEUKOCYTES, NITRITE, OR GLUCOSE <1000 mg/dL.  TROPONIN I     Status: None   Collection Time    05/21/13 12:20 AM      Result Value Range   Troponin I <0.30  <0.30 ng/mL   Comment:            Due to the release kinetics of cTnI,     a negative result within the first hours     of the onset of  symptoms does not rule out     myocardial infarction with certainty.     If myocardial infarction is still suspected,     repeat the test at appropriate intervals.  TROPONIN I     Status: None   Collection Time    05/21/13  5:13 AM      Result Value Range   Troponin I <0.30  <0.30 ng/mL   Comment:            Due to the release kinetics of cTnI,     a negative result within the first hours     of the onset of symptoms does not rule out     myocardial infarction with certainty.     If myocardial infarction is still suspected,     repeat the test at appropriate intervals.  CBC     Status: Abnormal   Collection Time    05/21/13  5:30 AM      Result Value Range   WBC 3.9 (*) 4.0 - 10.5 K/uL   RBC 3.58 (*) 3.87 - 5.11 MIL/uL   Hemoglobin 11.1 (*) 12.0 - 15.0 g/dL   HCT 16.1 (*) 09.6 - 04.5 %   MCV 88.8  78.0 - 100.0 fL   MCH 31.0  26.0 - 34.0 pg   MCHC 34.9  30.0 - 36.0 g/dL   RDW 40.9  81.1 - 91.4 %   Platelets 162  150 - 400 K/uL  BASIC METABOLIC PANEL     Status: Abnormal   Collection Time    05/21/13  5:30 AM      Result Value Range   Sodium 130 (*) 135 - 145 mEq/L   Potassium 3.7  3.5 - 5.1 mEq/L   Chloride 94 (*) 96 - 112 mEq/L   CO2 25  19 - 32 mEq/L   Glucose, Bld 97  70 - 99 mg/dL   BUN 9  6 - 23 mg/dL   Creatinine, Ser 7.82  0.50 - 1.10 mg/dL   Calcium 9.7  8.4 - 95.6 mg/dL   GFR calc non Af Amer 81 (*) >90 mL/min   GFR calc Af Amer >90  >90 mL/min   Comment: (NOTE)     The eGFR has been calculated using the CKD EPI equation.     This calculation has not been validated in all clinical situations.     eGFR's persistently <90 mL/min signify possible Chronic Kidney     Disease.  TROPONIN I     Status: None   Collection Time    05/21/13 12:13 PM      Result Value Range   Troponin I <0.30  <0.30 ng/mL   Comment:            Due to the release kinetics of cTnI,     a negative result within the first hours     of the onset of symptoms does not rule out      myocardial infarction with certainty.     If myocardial infarction is still suspected,     repeat the test at appropriate intervals.    Dg Lumbar Spine Complete  05/20/2013   CLINICAL DATA:  Pain post trauma  EXAM: LUMBAR SPINE - COMPLETE 4+ VIEW  COMPARISON:  Postmyelogram lumbar spine CT December 14, 2012  FINDINGS: Frontal, lateral, spot lumbosacral lateral, and bilateral oblique views were obtained. There is lumbar dextrorotoscoliosis. There is a anterior wedging of the L1 vertebral body, a finding not present previously. No  retropulsion of bone is seen in this area. There is no other fracture. There is 10 mm of anterolisthesis of L4 on L5, stable. There is no new spondylolisthesis.  There is mild disc space narrowing at L4-5 and L5-S1. There is facet arthropathy at all levels bilaterally. There is atherosclerotic change in the aorta.  IMPRESSION: Anterior wedging of the L1 vertebral body, a finding not present previously. No other fracture. There is stable 10 mm of anterolisthesis of L4 on L5. No new spondylolisthesis. There is dextrorotoscoliosis. There is multilevel osteoarthritic change.   Electronically Signed   By: Bretta Bang   On: 05/20/2013 19:03   Assessment/Plan: 79 year old presenting with recurrent falls of unclear etiology. Patient has no clinical signs of dementia nor any signs of Parkinson's disease. Is also no indication of recurrent stroke.  Recommendations: 1. No further neurodiagnostic studies are indicated, if EEG is unremarkable 2. Physical therapy evaluation of patient's gait and recommendations for ambulating safely 3. Strongly emphasized to patient that she needs to use an assistive device at this point with walking to avoid serious injury from a recurrent fall  C.R. Roseanne Reno, MD [facility] [phone]  05/21/2013, 2:37 PM

## 2013-05-21 NOTE — Progress Notes (Signed)
INITIAL NUTRITION ASSESSMENT  DOCUMENTATION CODES Per approved criteria  -Not Applicable   INTERVENTION:  Ensure Complete PO BID, each supplement provides 350 kcal and 13 grams of protein.  NUTRITION DIAGNOSIS: Inadequate oral intake related to poor appetite as evidenced by 30-60% meal completion.   Goal: Intake to meet >90% of estimated nutrition needs.  Monitor:  PO intake, labs, weight trend.  Reason for Assessment: MST=2  79 y.o. female  Admitting Dx: Pre-syncope  ASSESSMENT: Patient admitted on 9/19 after having a fall while standing. Found to have a mild compression fracture. PT consulted. Neurology consulted. Work-up is ongoing. Intake of meals since admission has been poor (30-60%). Patient with 8 lb weight loss over the past week.  Height: Ht Readings from Last 1 Encounters:  05/20/13 5\' 3"  (1.6 m)    Weight: Wt Readings from Last 1 Encounters:  05/21/13 135 lb 6.4 oz (61.417 kg)    Ideal Body Weight: 52.3 kg  % Ideal Body Weight: 117%  Wt Readings from Last 10 Encounters:  05/21/13 135 lb 6.4 oz (61.417 kg)  05/17/13 143 lb (64.864 kg)  04/05/13 142 lb 8 oz (64.638 kg)  01/17/13 148 lb 12.8 oz (67.495 kg)  01/07/13 142 lb 6.4 oz (64.592 kg)  01/07/13 142 lb 6.4 oz (64.592 kg)  01/03/13 142 lb 6.4 oz (64.592 kg)  01/23/12 144 lb (65.318 kg)  12/22/11 144 lb 9.6 oz (65.59 kg)  07/14/08 144 lb (65.318 kg)    Usual Body Weight: 143 lb  % Usual Body Weight: 94%  BMI:  Body mass index is 23.99 kg/(m^2).  Estimated Nutritional Needs: Kcal: 1500-1700 Protein: 75-90 gm Fluid: 1.5-1.7 L  Skin: no wounds  Diet Order: General  EDUCATION NEEDS: -Education not appropriate at this time   Intake/Output Summary (Last 24 hours) at 05/21/13 1354 Last data filed at 05/21/13 1300  Gross per 24 hour  Intake    960 ml  Output    900 ml  Net     60 ml    Last BM: 9/19   Labs:   Recent Labs Lab 05/20/13 1725 05/21/13 0530  NA 125* 130*  K  3.8 3.7  CL 89* 94*  CO2 25 25  BUN 13 9  CREATININE 0.76 0.67  CALCIUM 9.9 9.7  GLUCOSE 103* 97    CBG (last 3)   Recent Labs  05/20/13 1637  GLUCAP 104*    Scheduled Meds: . aspirin EC  81 mg Oral Daily  . fluticasone  2 spray Each Nare Daily  . heparin  5,000 Units Subcutaneous Q8H  . [START ON 05/22/2013] influenza vac split quadrivalent PF  0.5 mL Intramuscular Tomorrow-1000  . pantoprazole  40 mg Oral q morning - 10a  . [START ON 05/22/2013] propranolol  40 mg Oral Daily  . simvastatin  40 mg Oral q1800  . sodium chloride  3 mL Intravenous Q12H  . vitamin C  500 mg Oral Daily    Continuous Infusions: . sodium chloride 100 mL/hr (05/21/13 1030)    Past Medical History  Diagnosis Date  . Depression   . Hypertension   . High cholesterol   . Rectal prolapse   . Hx of adenomatous colonic polyps   . Internal hemorrhoids   . Hypothyroidism   . Fecal incontinence   . TIA (transient ischemic attack)   . Stroke , sept 1, 2010sept 12, 2011    x 2  . History of frequent urinary tract infections     recent  .  Migraine   . Fatty liver 03/20/13    Past Surgical History  Procedure Laterality Date  . Colon surgery  2010, for prolapsed organs after hystecrtomy surgery  . Appendectomy  2008  . Abdominal hysterectomy  2010  . Cholecystectomy    . Lumbar laminectomy/decompression microdiscectomy N/A 01/07/2013    Procedure: LUMBAR LAMINECTOMY CENTRAL DECOMPRESSION L4-L5, BILATERAL FORAMENOTOMY L4,L5    ;  Surgeon: Jacki Cones, MD;  Location: [facility];  Service: Orthopedics;  Laterality: N/A;    Joaquin Courts, RD, LDN, CNSC Pager [phone] After Hours Pager [phone]

## 2013-05-22 ENCOUNTER — Inpatient Hospital Stay (HOSPITAL_COMMUNITY): Payer: Medicare Other

## 2013-05-22 DIAGNOSIS — R51 Headache: Secondary | ICD-10-CM

## 2013-05-22 DIAGNOSIS — I517 Cardiomegaly: Secondary | ICD-10-CM

## 2013-05-22 LAB — BASIC METABOLIC PANEL
BUN: 9 mg/dL (ref 6–23)
Calcium: 9.6 mg/dL (ref 8.4–10.5)
Chloride: 98 mEq/L (ref 96–112)
Creatinine, Ser: 0.66 mg/dL (ref 0.50–1.10)
GFR calc Af Amer: >90 mL/min (ref >90)
GFR calc non Af Amer: 81 mL/min — ABNORMAL LOW (ref >90)
Potassium: 3.8 mEq/L (ref 3.5–5.1)

## 2013-05-22 LAB — TSH: TSH: 2.882 u[IU]/mL (ref 0.350–4.500)

## 2013-05-22 MED ORDER — HYDRALAZINE HCL 20 MG/ML IJ SOLN: 5.0000 mg | Freq: Once | INTRAMUSCULAR | Status: DC

## 2013-05-22 MED ORDER — HYDRALAZINE HCL 20 MG/ML IJ SOLN
5.0000 mg | Freq: Once | INTRAMUSCULAR | Status: AC
  Administered 2013-05-22: 5 mg via INTRAVENOUS
  Filled 2013-05-22: qty 1

## 2013-05-22 MED ORDER — PROPRANOLOL HCL 40 MG PO TABS
40.0000 mg | ORAL_TABLET | Freq: Three times a day (TID) | ORAL | Status: DC
  Administered 2013-05-22: 40 mg via ORAL
  Filled 2013-05-22 (×4): qty 1

## 2013-05-22 MED ORDER — AMLODIPINE BESYLATE 2.5 MG PO TABS
2.5000 mg | ORAL_TABLET | Freq: Every day | ORAL | Status: DC
  Filled 2013-05-22: qty 1

## 2013-05-22 MED ORDER — LOSARTAN POTASSIUM 50 MG PO TABS: 50.0000 mg | ORAL_TABLET | Freq: Every morning | ORAL | Status: DC

## 2013-05-22 MED ORDER — LOSARTAN POTASSIUM 50 MG PO TABS
50.0000 mg | ORAL_TABLET | Freq: Every morning | ORAL | Status: DC
  Filled 2013-05-22: qty 1

## 2013-05-22 NOTE — Progress Notes (Signed)
  Echocardiogram 2D Echocardiogram has been performed.  Wilmont Olund FRANCES 05/22/2013, 12:25 PM

## 2013-05-22 NOTE — Care Management Note (Signed)
    Page 1 of 1   05/22/2013     6:25:54 PM   CARE MANAGEMENT NOTE 05/22/2013  Patient:  Kathryn [name], Kathryn [name]   Account Number:  [id number]  Date Initiated:  05/22/2013  Documentation initiated by:  [facility]  Subjective/Objective Assessment:   adm: pre-syncope     Action/Plan:   discharge planning   Anticipated DC Date:  05/22/2013   Anticipated DC Plan:  HOME W HOME HEALTH SERVICES         Choice offered to / List presented to:             Status of service:  Completed, signed off Medicare Important Message given?   (If response is "NO", the following Medicare IM given date fields will be blank) Date Medicare IM given:   Date Additional Medicare IM given:    Discharge Disposition:    Per UR Regulation:    If discussed at Long Length of Stay Meetings, dates discussed:    Comments:  05/22/13 18:20 CM follow up with RN as pt refuses home health.  Rolling walker was brought but pt refused walker and home health.  No other CM needs were communicated. Freddy Jaksch, BSN, CM [phone].

## 2013-05-22 NOTE — Discharge Summary (Signed)
Physician Discharge Summary  [name]:[id number] DOB: 01/18/1931 DOA: 05/20/2013  PCP: Emeterio Reeve, MD  Admit date: 05/20/2013 Discharge date: 05/22/2013  Time spent: 35 minutes  Recommendations for Outpatient Follow-up:  1. Please repeat orthostatic vitals, adjust BP medications as needed.  2. Repeat B-met to follow sodium level.   Discharge Diagnoses:    Pre-syncope   HYPERTENSION   Recurrent falls   Discharge Condition: Stable.   Diet recommendation: Heart healthy  Filed Weights   05/20/13 2153 05/21/13 0401  Weight: 61.417 kg (135 lb 6.4 oz) 61.417 kg (135 lb 6.4 oz)    History of present illness:  Kathryn [name] is a 79 y.o. female who had a fall while standing today. The patients history is very shifty and seems to change with each telling. She claims at this time that there was no LOC associated with the fall, that she is falling at a rate of about once a month or so for the past couple of years. Note that this does differ somewhat from what she told Dr. Ethelda Chick and BOTH histories are different from what she told her son. Regardless, she did cause a mild compression fracture in the fall, there was loss of urine in the fall. She denies that the falls occur when going from a seated to a standing position to me but does describe several other events which sound suspicious for this.  Overall history taking is very difficult and patient is a very poor historian.   Hospital Course:  1-Syncope, vs near syncope. Likely secondary to orthostatic hypotension secondary to decrease volume.  Patient had urinary incontinence and bite her tongue during episode. EEG negative for seizure.  MRI brain negative for acute stroke. Neurology recommend no further evaluation. Troponin time 3 negative. Hyponatremia improved question component dehydration. Likely related to Patient orthostatic, orthostatic by vitals. I will hold BP medications. ECHO with normal EF.   2-Orthostatic: Resolved  with  IV fluids. Repeat orthostatic vital negative for orthostatic.  3-Compression fracture L-1: pain management: PT consult. Pain management.  4-HTN: resume BP medications. BP increasing.  5-Hyponatremia: improved with IV fluids. Increase from 125 to 134.  6-History of hypothyroidism: TSH at 2.8.    Procedures:  EEG: negative for seizure.   Consultations:  neurology  Discharge Exam: Filed Vitals:   05/22/13 0605  BP: 163/70  Pulse:   Temp:   Resp:     General: no distress.  Cardiovascular: S 1, S 2 RRR Respiratory: CTA  Discharge Instructions  Discharge Orders   Future Orders Complete By Expires   Diet - low sodium heart healthy  As directed    Increase activity slowly  As directed        Medication List         amLODipine 2.5 MG tablet  Commonly known as:  NORVASC  Take 2.5 mg by mouth daily.     aspirin EC 81 MG tablet  Take 81 mg by mouth daily.     clonazePAM 1 MG tablet  Commonly known as:  KLONOPIN  Take 0.5-1 mg by mouth at bedtime as needed for anxiety.     fluticasone 50 MCG/ACT nasal spray  Commonly known as:  FLONASE  Place 2 sprays into the nose daily.     hyoscyamine 0.125 MG SL tablet  Commonly known as:  LEVSIN SL  Take 1 tablet (0.125 mg total) by mouth every 8 (eight) hours as needed. For colon spasms     losartan 50 MG  tablet  Commonly known as:  COZAAR  Take 1 tablet (50 mg total) by mouth every morning.     pantoprazole 40 MG tablet  Commonly known as:  PROTONIX  Take 1 tablet (40 mg total) by mouth every morning.     PRESCRIPTION MEDICATION  Pt gets allergy shots once weekly. Dr [place] Callas.     propranolol 40 MG tablet  Commonly known as:  INDERAL  Take 40 mg by mouth 3 (three) times daily.     simvastatin 40 MG tablet  Commonly known as:  ZOCOR  Take 40 mg by mouth every morning.     traMADol-acetaminophen 37.5-325 MG per tablet  Commonly known as:  ULTRACET  Take 0.5 tablets by mouth every 4 (four) hours as needed.      VALIUM 2 MG tablet  Generic drug:  diazepam  Take 2 mg by mouth every 6 (six) hours as needed. For anxiety     vitamin C 500 MG tablet  Commonly known as:  ASCORBIC ACID  Take 500 mg by mouth daily.     vitamin E 200 UNIT capsule  Take 400 Units by mouth daily.     zolpidem 12.5 MG CR tablet  Commonly known as:  AMBIEN CR  Take 12.5 mg by mouth at bedtime.       Allergies  Allergen Reactions  . Augmentin [Amoxicillin-Pot Clavulanate] Swelling    Throat   . Citalopram Swelling    Throat and eyes   . Codeine Anaphylaxis  . Ibuprofen Swelling    Throat and eyes   . Latex Swelling and Rash    Swelling to troat  . Lipitor [Atorvastatin] Swelling    throat  . Septra [Bactrim] Anaphylaxis  . Keflet [Cephalexin] Diarrhea    Upset stomach  . Vicodin [Hydrocodone-Acetaminophen] Other (See Comments)    stroke  . Ciprofloxacin Nausea And Vomiting  . Depakene [Valproate Sodium] Hives    All over body   . Isoptin Sr [Verapamil Hcl Er] Cough  . Lisinopril Cough  . Miconazole Rash       Follow-up Information   Follow up with Emeterio Reeve, MD In 1 week.   Specialty:  Family Medicine   Contact information:   [address] [place] [address] [phone]        The results of significant diagnostics from this hospitalization (including imaging, microbiology, ancillary and laboratory) are listed below for reference.    Significant Diagnostic Studies: Dg Lumbar Spine Complete  05/20/2013   CLINICAL DATA:  Pain post trauma  EXAM: LUMBAR SPINE - COMPLETE 4+ VIEW  COMPARISON:  Postmyelogram lumbar spine CT December 14, 2012  FINDINGS: Frontal, lateral, spot lumbosacral lateral, and bilateral oblique views were obtained. There is lumbar dextrorotoscoliosis. There is a anterior wedging of the L1 vertebral body, a finding not present previously. No retropulsion of bone is seen in this area. There is no other fracture. There is 10 mm of anterolisthesis of L4 on L5,  stable. There is no new spondylolisthesis.  There is mild disc space narrowing at L4-5 and L5-S1. There is facet arthropathy at all levels bilaterally. There is atherosclerotic change in the aorta.  IMPRESSION: Anterior wedging of the L1 vertebral body, a finding not present previously. No other fracture. There is stable 10 mm of anterolisthesis of L4 on L5. No new spondylolisthesis. There is dextrorotoscoliosis. There is multilevel osteoarthritic change.   Electronically Signed   By: Bretta Bang   On: 05/20/2013 19:03   Mr  Brain Wo Contrast  05/22/2013   CLINICAL DATA:  Syncope. Urinary incontinence. Fall  EXAM: MRI HEAD WITHOUT CONTRAST  TECHNIQUE: Multiplanar, multisequence MR imaging was performed. No intravenous contrast was administered.  COMPARISON:  MRI 10/1910  FINDINGS: Generalized atrophy. Cerebral volume is normal for age. Scattered chronic infarcts are present in the cerebral white matter extending into the internal capsule bilaterally and the right thalamus. Chronic ischemia in the pons bilaterally. These findings are stable.  Negative for acute infarct. Negative for hemorrhage or fluid collection. Negative for mass or edema. No midline shift.  Paranasal sinuses are clear.  IMPRESSION: Chronic microvascular ischemic change. No acute abnormality.   Electronically Signed   By: Marlan Palau M.D.   On: 05/22/2013 09:17    Microbiology: No results found for this or any previous visit (from the past 240 hour(s)).   Labs: Basic Metabolic Panel:  Recent Labs Lab 05/20/13 1725 05/21/13 0530 05/22/13 0545  NA 125* 130* 134*  K 3.8 3.7 3.8  CL 89* 94* 98  CO2 25 25 25   GLUCOSE 103* 97 98  BUN 13 9 9   CREATININE 0.76 0.67 0.66  CALCIUM 9.9 9.7 9.6   Liver Function Tests:  Recent Labs Lab 05/20/13 1725  AST 24  ALT 12  ALKPHOS 49  BILITOT 0.9  PROT 7.1  ALBUMIN 3.9   No results found for this basename: LIPASE, AMYLASE,  in the last 168 hours No results found for this  basename: AMMONIA,  in the last 168 hours CBC:  Recent Labs Lab 05/20/13 1725 05/21/13 0530  WBC 6.8 3.9*  NEUTROABS 5.4  --   HGB 12.2 11.1*  HCT 34.4* 31.8*  MCV 89.1 88.8  PLT 169 162   Cardiac Enzymes:  Recent Labs Lab 05/20/13 1725 05/21/13 0020 05/21/13 0513 05/21/13 1213  TROPONINI <0.30 <0.30 <0.30 <0.30   BNP: BNP (last 3 results) No results found for this basename: PROBNP,  in the last 8760 hours CBG:  Recent Labs Lab 05/20/13 1637  GLUCAP 104*       Signed:  Jamaiyah Pyle  [facility] 05/22/2013, 10:01 AM

## 2013-05-23 NOTE — Care Management (Signed)
   CARE MANAGEMENT NOTE 05/23/2013  Patient:  Kathryn [name], Kathryn [name]   Account Number:  [id number]  Date Initiated:  05/22/2013  Documentation initiated by:  [facility]  Subjective/Objective Assessment:   adm: pre-syncope     Action/Plan:   discharge planning   Anticipated DC Date:  05/22/2013   Anticipated DC Plan:  HOME W HOME HEALTH SERVICES         Choice offered to / List presented to:          [facility] arranged  HH-1 RN  HH-10 DISEASE MANAGEMENT  HH-2 PT  HH-6 SOCIAL WORKER      HH agency  [facility]   Status of service:  Completed, signed off Medicare Important Message given?   (If response is "NO", the following Medicare IM given date fields will be blank) Date Medicare IM given:   Date Additional Medicare IM given:    Discharge Disposition:  HOME W HOME HEALTH SERVICES  Per UR Regulation:  Reviewed for med. necessity/level of care/duration of stay  If discussed at Long Length of Stay Meetings, dates discussed:    Comments:  05-23-13 [address] [place], [place] [phone] CM did receive [name] call from MD [facility] in reference to pt needing assitance at home. CM did call pt this am in reference to home care and pt is concerned about finances. Pt states that she is agreeable to [facility] RN,PT and SW. she is having difficulty with transportation needs. CM did text MD for additional orders. Cm did call [facility] for referral and SOC to begin within 24-48 hrs of d/c. No further needs from CM at this time.  05/22/13 18:20 CM follow up with RN as pt refuses home health.  Rolling walker was brought but pt refused walker and home health.  No other CM needs were communicated. Freddy Jaksch, BSN, CM [phone].

## 2013-05-25 ENCOUNTER — Emergency Department (HOSPITAL_COMMUNITY): Payer: Medicare Other

## 2013-05-25 ENCOUNTER — Emergency Department (HOSPITAL_COMMUNITY)
Admission: EM | Admit: 2013-05-25 | Discharge: 2013-05-25 | Disposition: A | Discharge status: Home / Self Care | Payer: Medicare Other | Attending: Emergency Medicine | Admitting: Emergency Medicine

## 2013-05-25 ENCOUNTER — Encounter (HOSPITAL_COMMUNITY): Payer: Self-pay | Admitting: Physical Medicine and Rehabilitation

## 2013-05-25 DIAGNOSIS — Z8719 Personal history of other diseases of the digestive system: Secondary | ICD-10-CM | POA: Insufficient documentation

## 2013-05-25 DIAGNOSIS — Z8679 Personal history of other diseases of the circulatory system: Secondary | ICD-10-CM | POA: Insufficient documentation

## 2013-05-25 DIAGNOSIS — M549 Dorsalgia, unspecified: Secondary | ICD-10-CM

## 2013-05-25 DIAGNOSIS — Z860100 Personal history of colon polyps, unspecified: Secondary | ICD-10-CM | POA: Insufficient documentation

## 2013-05-25 DIAGNOSIS — R51 Headache: Secondary | ICD-10-CM | POA: Insufficient documentation

## 2013-05-25 DIAGNOSIS — Z87440 Personal history of urinary (tract) infections: Secondary | ICD-10-CM | POA: Insufficient documentation

## 2013-05-25 DIAGNOSIS — Z8673 Personal history of transient ischemic attack (TIA), and cerebral infarction without residual deficits: Secondary | ICD-10-CM | POA: Insufficient documentation

## 2013-05-25 DIAGNOSIS — E871 Hypo-osmolality and hyponatremia: Secondary | ICD-10-CM | POA: Insufficient documentation

## 2013-05-25 DIAGNOSIS — E039 Hypothyroidism, unspecified: Secondary | ICD-10-CM | POA: Insufficient documentation

## 2013-05-25 DIAGNOSIS — Z8669 Personal history of other diseases of the nervous system and sense organs: Secondary | ICD-10-CM | POA: Insufficient documentation

## 2013-05-25 DIAGNOSIS — E7800 Pure hypercholesterolemia, unspecified: Secondary | ICD-10-CM | POA: Insufficient documentation

## 2013-05-25 DIAGNOSIS — I10 Essential (primary) hypertension: Secondary | ICD-10-CM | POA: Insufficient documentation

## 2013-05-25 DIAGNOSIS — Z79899 Other long term (current) drug therapy: Secondary | ICD-10-CM | POA: Insufficient documentation

## 2013-05-25 DIAGNOSIS — F329 Major depressive disorder, single episode, unspecified: Secondary | ICD-10-CM | POA: Insufficient documentation

## 2013-05-25 DIAGNOSIS — G8921 Chronic pain due to trauma: Secondary | ICD-10-CM | POA: Insufficient documentation

## 2013-05-25 DIAGNOSIS — R32 Unspecified urinary incontinence: Secondary | ICD-10-CM | POA: Insufficient documentation

## 2013-05-25 DIAGNOSIS — F3289 Other specified depressive episodes: Secondary | ICD-10-CM | POA: Insufficient documentation

## 2013-05-25 LAB — URINALYSIS, ROUTINE W REFLEX MICROSCOPIC
Glucose, UA: NEGATIVE mg/dL
Hgb urine dipstick: NEGATIVE
Ketones, ur: NEGATIVE mg/dL
Leukocytes, UA: NEGATIVE
Nitrite: NEGATIVE
Specific Gravity, Urine: 1.005 (ref 1.005–1.030)
pH: 7 (ref 5.0–8.0)

## 2013-05-25 LAB — CBC WITH DIFFERENTIAL/PLATELET
Basophils Absolute: 0 10*3/uL (ref 0.0–0.1)
HCT: 35.5 % — ABNORMAL LOW (ref 36.0–46.0)
Hemoglobin: 12.1 g/dL (ref 12.0–15.0)
Lymphocytes Relative: 23 % (ref 12–46)
Monocytes Absolute: 0.7 10*3/uL (ref 0.1–1.0)
Neutro Abs: 3.1 10*3/uL (ref 1.7–7.7)
Neutrophils Relative %: 60 % (ref 43–77)
RDW: 12.8 % (ref 11.5–15.5)
WBC: 5.2 10*3/uL (ref 4.0–10.5)

## 2013-05-25 LAB — BASIC METABOLIC PANEL
CO2: 28 mEq/L (ref 19–32)
Chloride: 92 mEq/L — ABNORMAL LOW (ref 96–112)
Creatinine, Ser: 0.72 mg/dL (ref 0.50–1.10)
GFR calc Af Amer: >90 mL/min (ref >90)
Glucose, Bld: 95 mg/dL (ref 70–99)
Potassium: 3.9 mEq/L (ref 3.5–5.1)

## 2013-05-25 MED ORDER — ACETAMINOPHEN 325 MG PO TABS
650.0000 mg | ORAL_TABLET | Freq: Once | ORAL | Status: AC
  Administered 2013-05-25: 650 mg via ORAL
  Filled 2013-05-25: qty 2

## 2013-05-25 MED ORDER — MORPHINE SULFATE 4 MG/ML IJ SOLN
4.0000 mg | Freq: Once | INTRAMUSCULAR | Status: AC
  Administered 2013-05-25: 4 mg via INTRAVENOUS
  Filled 2013-05-25: qty 1

## 2013-05-25 MED ORDER — SODIUM CHLORIDE 0.9 % IV BOLUS (SEPSIS)
500.0000 mL | Freq: Once | INTRAVENOUS | Status: AC
  Administered 2013-05-25: 500 mL via INTRAVENOUS

## 2013-05-25 NOTE — ED Notes (Addendum)
Pt is sitting on side of bed, reporting she needs pain medicine before she goes to CT. Pt does not appear in any distress. Is resting calmly.

## 2013-05-25 NOTE — ED Provider Notes (Addendum)
79 year old female, frequent falls, recent back surgery, presents with migraine headaches and lower back pain which seems to be getting worse over the last week. MRI today reveals that she has no significant cord problem, no increased signal, no hemorrhage, no abscess and no significantly sized fracture fragments causing trouble. The patient has ambulated back and forth to the bathroom, she is not have a urinary tract infection, she may need kyphoplasty or at least possible referral for orthopedic evaluation. Will discuss with orthopedics, refer as indicated, the patient appears stable for discharge. I have also discussed this with the patient's son who states that she is gradually decline in her ability to function at home but absolutely refuses placement or even to walk with a walker despite being told multiple times. He agrees that she still has her decision-making capacity, she is not demented, at this time the best course is to have the patient follow up with family doctor for chronic pain management, orthopedic referral.  D/w Dr. Shon Baton, requets CT - has been done - pt to be d/c.    Vida Roller, MD 05/25/13 1743  Vida Roller, MD 05/25/13 2115

## 2013-05-25 NOTE — ED Provider Notes (Addendum)
TIME SEEN: 11:44 AM  CHIEF COMPLAINT: Back pain, urinary incontinence  HPI: Patient is an 79 year old female with a history of hypertension, hyperlipidemia, hypothyroidism, recurrent, daily headaches, TIA, recurrent falls who presents the emergency department with complaints of increasing back pain and urinary incontinence. Patient was seen in the emergency department approximately one week ago after a fall. She was diagnosed with an L1 compression fracture and was admitted to the hospital and she was hyponatremic. Patient reports that since discharge home, her pain has increased and she is began having urinary incontinence for the past 5 days. She describes some numbness in her left posterior hip and buttock that is chronic. No new numbness. No weakness of her lower extremities. No bowel incontinence. No fever. No new injury to her back. She reports she called her primary care physician's office today who instructed her to come to the emergency department for further evaluation.  In May of 2014, patient had a decompressive lumbar laminectomy at L4-5 and a partial hemilaminectomy at L3-4. This was performed by [facility] orthopedics.  PCP is Dr. Laurine Blazer [facility]  ROS: See HPI Constitutional: no fever  Eyes: no drainage  ENT: no runny nose   Cardiovascular:  no chest pain  Resp: no SOB  GI: no vomiting GU: no dysuria Integumentary: no rash  Allergy: no hives  Musculoskeletal: no leg swelling  Neurological: no slurred speech ROS otherwise negative  PAST MEDICAL HISTORY/PAST SURGICAL HISTORY:  Past Medical History  Diagnosis Date  . Depression   . Hypertension   . High cholesterol   . Rectal prolapse   . Hx of adenomatous colonic polyps   . Internal hemorrhoids   . Hypothyroidism   . Fecal incontinence   . TIA (transient ischemic attack)   . Stroke , sept 1, 2010sept 12, 2011    x 2  . History of frequent urinary tract infections     recent  . Migraine   . Fatty  liver 03/20/13    MEDICATIONS:  Prior to Admission medications   Medication Sig Start Date End Date Taking? Authorizing Provider  amLODipine (NORVASC) 2.5 MG tablet Take 2.5 mg by mouth daily.     Historical Provider, MD  aspirin EC 81 MG tablet Take 81 mg by mouth daily.    Historical Provider, MD  clonazePAM (KLONOPIN) 1 MG tablet Take 0.5-1 mg by mouth at bedtime as needed for anxiety.  01/23/12   Hart Carwin, MD  diazepam (VALIUM) 2 MG tablet Take 2 mg by mouth every 6 (six) hours as needed. For anxiety    Historical Provider, MD  fluticasone (FLONASE) 50 MCG/ACT nasal spray Place 2 sprays into the nose daily.    Historical Provider, MD  hyoscyamine (LEVSIN SL) 0.125 MG SL tablet Take 1 tablet (0.125 mg total) by mouth every 8 (eight) hours as needed. For colon spasms 04/05/13   Hart Carwin, MD  losartan (COZAAR) 50 MG tablet Take 1 tablet (50 mg total) by mouth every morning. 05/22/13   Belkys A Regalado, MD  pantoprazole (PROTONIX) 40 MG tablet Take 1 tablet (40 mg total) by mouth every morning. 04/18/13   Governor Rooks, MD  PRESCRIPTION MEDICATION Pt gets allergy shots once weekly. Dr [place] Callas.    Historical Provider, MD  propranolol (INDERAL) 40 MG tablet Take 40 mg by mouth 3 (three) times daily.    Historical Provider, MD  simvastatin (ZOCOR) 40 MG tablet Take 40 mg by mouth every morning.     Historical  Provider, MD  traMADol-acetaminophen (ULTRACET) 37.5-325 MG per tablet Take 0.5 tablets by mouth every 4 (four) hours as needed.  04/04/13   Historical Provider, MD  vitamin C (ASCORBIC ACID) 500 MG tablet Take 500 mg by mouth daily.    Historical Provider, MD  vitamin E 200 UNIT capsule Take 400 Units by mouth daily.     Historical Provider, MD  zolpidem (AMBIEN CR) 12.5 MG CR tablet Take 12.5 mg by mouth at bedtime.    Historical Provider, MD    ALLERGIES:  Allergies  Allergen Reactions  . Augmentin [Amoxicillin-Pot Clavulanate] Swelling    Throat   . Citalopram Swelling     Throat and eyes   . Codeine Anaphylaxis  . Ibuprofen Swelling    Throat and eyes   . Latex Swelling and Rash    Swelling to troat  . Lipitor [Atorvastatin] Swelling    throat  . Septra [Bactrim] Anaphylaxis  . Keflet [Cephalexin] Diarrhea    Upset stomach  . Vicodin [Hydrocodone-Acetaminophen] Other (See Comments)    stroke  . Ciprofloxacin Nausea And Vomiting  . Depakene [Valproate Sodium] Hives    All over body   . Isoptin Sr [Verapamil Hcl Er] Cough  . Lisinopril Cough  . Miconazole Rash    SOCIAL HISTORY:  History  Substance Use Topics  . Smoking status: Never Smoker   . Smokeless tobacco: Never Used  . Alcohol Use: No    FAMILY HISTORY: Family History  Problem Relation Age of Onset  . Stroke Father   . Migraines Father   . CVA Father   . Heart attack Father   . Hypertension Maternal Aunt     x3  . Colon cancer Neg Hx   . CVA Mother     EXAM: BP 137/73  Pulse 68  Temp(Src) 98.6 F (37 C) (Oral)  Resp 18  SpO2 100% CONSTITUTIONAL: Alert and oriented and responds appropriately to questions. Well-appearing; well-nourished HEAD: Normocephalic EYES: Conjunctivae clear, PERRL ENT: normal nose; no rhinorrhea; moist mucous membranes; pharynx without lesions noted NECK: Supple, no meningismus, no LAD  CARD: RRR; S1 and S2 appreciated; no murmurs, no clicks, no rubs, no gallops RESP: Normal chest excursion without splinting or tachypnea; breath sounds clear and equal bilaterally; no wheezes, no rhonchi, no rales,  ABD/GI: Normal bowel sounds; non-distended; soft, non-tender, no rebound, no guarding BACK:  The back appears normal; patient has lumbar spinal tenderness without step-off or deformity, no CVA tenderness EXT: Normal ROM in all joints; non-tender to palpation; no edema; normal capillary refill; no cyanosis    SKIN: Normal color for age and race; warm NEURO: Moves all extremities equally, reflexes +1 in bilateral lower extremities and upper  extremities, sensation to light touch intact diffusely PSYCH: The patient's mood and manner are appropriate. Grooming and personal hygiene are appropriate.  MEDICAL DECISION MAKING: Patient with recent fall with L1 compression fracture who presents with worsening back pain and urinary incontinence. She is neurologically intact on exam with slightly diminished reflexes in her lower extremities but are similar to her upper extremities. No new injury. My concern for cauda equina is low but I feel given her new urinary incontinence, she will need an MRI of her lumbar spine today. We'll obtain labs, urinalysis and give IV pain medication. Disposition per MRI.  ED PROGRESS: Postvoid residual is 187 mL.  2:15 PM Pt labs show mild hyponatremia with a sodium of 128. Will give IV fluids. Otherwise labs and urine unremarkable. MRI lumbar spine  pending.  3:35 PM  MRI pending.  Patient and family updated on plan. Signed out to Dr. Hyacinth Meeker.  Layla Maw Ward, DO 05/25/13 1535  Kristen N Ward, DO 05/25/13 1546

## 2013-05-25 NOTE — ED Notes (Signed)
Spoke with MRI , reporting still going to be 45 mins until they will send for patient for scan. Pt and EDP made aware.

## 2013-05-25 NOTE — ED Notes (Signed)
Patient transported to MRI 

## 2013-05-25 NOTE — ED Notes (Signed)
Returned from CT.  Pt ready for discharge home.

## 2013-05-25 NOTE — ED Notes (Signed)
Pt presents to department for evaluation of back pain and headache. States she fell x1 week ago, was admitted to hospital and discharged on sunday. States pain began after fall, now states increased back pain and throbbing headache. 8/10 pain upon arrival. Pt is conscious alert and oriented x4. Able to move all extremities. No relief of pain at home, states "they shouldn't of let me go home on Sunday."

## 2013-05-25 NOTE — ED Notes (Signed)
Pt waiting for CT exam. Pt getting undressed into a gown.

## 2013-05-26 LAB — URINE CULTURE
Colony Count: NO GROWTH
Culture: NO GROWTH

## 2013-06-23 ENCOUNTER — Telehealth: Payer: Self-pay | Admitting: *Deleted

## 2013-06-23 NOTE — Telephone Encounter (Signed)
Spoke with Nicholos Johns. She states she will have the patient call us to discuss her symptoms.

## 2013-06-23 NOTE — Telephone Encounter (Signed)
Received a VM from [facility] nurse that patient is c/o upper mid abdominal pain that is sharp/throbbing. Left a message for Nicholos Johns to call me.

## 2013-06-29 ENCOUNTER — Other Ambulatory Visit: Payer: Self-pay | Admitting: Internal Medicine

## 2013-07-14 ENCOUNTER — Other Ambulatory Visit: Payer: Self-pay | Admitting: Internal Medicine

## 2013-08-09 ENCOUNTER — Encounter: Payer: Self-pay | Admitting: Internal Medicine

## 2013-08-09 ENCOUNTER — Ambulatory Visit (INDEPENDENT_AMBULATORY_CARE_PROVIDER_SITE_OTHER): Payer: Medicare Other | Admitting: Internal Medicine

## 2013-08-09 VITALS — BP 128/62 | HR 64 | Ht 60.63 in | Wt 136.2 lb

## 2013-08-09 DIAGNOSIS — Z1211 Encounter for screening for malignant neoplasm of colon: Secondary | ICD-10-CM

## 2013-08-09 DIAGNOSIS — R1032 Left lower quadrant pain: Secondary | ICD-10-CM

## 2013-08-09 MED ORDER — ESTRADIOL 10 MCG VA TABS: ORAL_TABLET | VAGINAL | Status: DC

## 2013-08-09 MED ORDER — MOVIPREP 100 G PO SOLR: 1.0000 | Freq: Once | ORAL | Status: DC

## 2013-08-09 MED ORDER — HYOSCYAMINE SULFATE 0.125 MG SL SUBL: SUBLINGUAL_TABLET | SUBLINGUAL | Status: DC

## 2013-08-09 NOTE — Progress Notes (Signed)
Kathryn [name] 09/22/1930 [id number]   History of Present Illness: 79 year old white female with chronic lower abdominal pain which occurs the doing tonight as well as during the day. It is partially relieved by Levsin 0.125 mg when necessary, she has lost 6 pounds since last visit in August 2014. CT scan of the abdomen showed a postcholecystectomy state. Post rectal prolapse repair and fatty liver. She is due for recall colonoscopy because of personal history of adenomatous polyps in January 2009. She denies rectal bleeding. Doing her last visit I thought she may be having ischemic colitis from low-flow state at night at reducing her medications did not make any difference. She is also complaining of bulb irritated vaginal tissue and yeast infections not responsive to Diflucan  Past Medical History  Diagnosis Date  . Depression   . Hypertension   . High cholesterol   . Rectal prolapse   . Hx of adenomatous colonic polyps   . Internal hemorrhoids   . Hypothyroidism   . Fecal incontinence   . TIA (transient ischemic attack)   . Stroke , sept 1, 2010sept 12, 2011    x 2  . History of frequent urinary tract infections     recent  . Migraine   . Fatty liver 03/20/13    Past Surgical History  Procedure Laterality Date  . Colon surgery  2010, for prolapsed organs after hystecrtomy surgery  . Appendectomy  2008  . Abdominal hysterectomy  2010  . Cholecystectomy    . Lumbar laminectomy/decompression microdiscectomy N/A 01/07/2013    Procedure: LUMBAR LAMINECTOMY CENTRAL DECOMPRESSION L4-L5, BILATERAL FORAMENOTOMY L4,L5    ;  Surgeon: Jacki Cones, MD;  Location: [facility];  Service: Orthopedics;  Laterality: N/A;    Allergies  Allergen Reactions  . Augmentin [Amoxicillin-Pot Clavulanate] Swelling    Throat   . Citalopram Swelling    Throat and eyes   . Codeine Anaphylaxis  . Fish-Derived Products Anaphylaxis  . Ibuprofen Swelling    Throat and eyes   . Latex Swelling and Rash   Swelling to troat  . Lipitor [Atorvastatin] Swelling    throat  . Septra [Bactrim] Anaphylaxis  . Shellfish Allergy Anaphylaxis  . Keflet [Cephalexin] Diarrhea    Upset stomach  . Vicodin [Hydrocodone-Acetaminophen] Other (See Comments)    stroke  . Ciprofloxacin Nausea And Vomiting  . Depakene [Valproate Sodium] Hives    All over body   . Isoptin Sr [Verapamil Hcl Er] Cough  . Lisinopril Cough  . Miconazole Rash    Review of Systems: Lower abdominal pain. He denies constipation or diarrhea  The remainder of the 10 point ROS is negative except as outlined in the H&P  Physical Exam: General Appearance Well developed, in no distress Eyes  Non icteric  HEENT  Non traumatic, normocephalic  Mouth No lesion, tongue papillated, no cheilosis Neck Supple without adenopathy, thyroid not enlarged, no carotid bruits, no JVD Lungs Clear to auscultation bilaterally COR Normal S1, normal S2, regular rhythm, no murmur, quiet precordium Abdomen Soft, tender across her lower abdomen with normoactive bowel sounds no palpable mass. No scars Rectal soft Hemoccult negative stool Extremities  No pedal edema Skin No lesions Neurological Alert and oriented x 3 Psychological Normal mood and affect  Assessment and Plan:   79 year old white female with chronic lower abdominal pain of unknown etiology may be related to her colon specifically diverticulosis, redundant colon. Or spasm. She will continue Levsin sublingually 0.125 mg when necessary. I have given her samples  of all probiotics to continue to take. She will be scheduled for colonoscopy for February 2015. Atrophic vaginitis. Vagifem 10mg , vaginally daily for 2 weeks then twice a week    Lina Sar 08/09/2013

## 2013-08-09 NOTE — Patient Instructions (Addendum)
You have been scheduled for a colonoscopy with propofol. Please follow written instructions given to you at your visit today.  Please pick up your prep kit at the pharmacy within the next 1-3 days. If you use inhalers (even only as needed), please bring them with you on the day of your procedure. Your physician has requested that you go to www.startemmi.com and enter the access code given to you at your visit today. This web site gives a general overview about your procedure. However, you should still follow specific instructions given to you by our office regarding your preparation for the procedure.  We have given you samples of a probiotic. We suggest Align. This puts good bacteria back into your colon. You should take 1 capsule by mouth once daily. If this works well for you, it can be purchased over the counter.  We have sent the following medications to your pharmacy for you to pick up at your convenience: Estrogen Cream  Levsin  CC: Dr Mila Palmer

## 2013-08-10 ENCOUNTER — Telehealth: Payer: Self-pay | Admitting: Internal Medicine

## 2013-08-10 NOTE — Telephone Encounter (Signed)
Spoke with patient and gave her Dr. Brodie's recommendations. 

## 2013-08-10 NOTE — Telephone Encounter (Signed)
Spoke with patient and she states she was told not to use Estradiol 10 mcg because she has a history of strokes and a "blockage." She is not going to use the Estradiol and wanted Dr. Juanda Chance to know this.

## 2013-08-10 NOTE — Telephone Encounter (Signed)
Please let pt know to follow Dr Ali Lowe suggestions and ask her what she would recommend for the rectal and vaginal iiration.

## 2013-10-04 ENCOUNTER — Ambulatory Visit (AMBULATORY_SURGERY_CENTER): Payer: Medicare Other | Admitting: Internal Medicine

## 2013-10-04 ENCOUNTER — Encounter: Payer: Self-pay | Admitting: Internal Medicine

## 2013-10-04 ENCOUNTER — Other Ambulatory Visit: Payer: Self-pay | Admitting: Internal Medicine

## 2013-10-04 VITALS — BP 155/70 | HR 69 | Resp 23 | Ht 60.0 in | Wt 136.0 lb

## 2013-10-04 DIAGNOSIS — D126 Benign neoplasm of colon, unspecified: Secondary | ICD-10-CM

## 2013-10-04 DIAGNOSIS — Z1211 Encounter for screening for malignant neoplasm of colon: Secondary | ICD-10-CM

## 2013-10-04 DIAGNOSIS — Z86010 Personal history of colonic polyps: Secondary | ICD-10-CM

## 2013-10-04 MED ORDER — SODIUM CHLORIDE 0.9 % IV SOLN: 500.0000 mL | INTRAVENOUS | Status: DC

## 2013-10-04 NOTE — Op Note (Signed)
[facility]  [address]. [place], [id number]   COLONOSCOPY PROCEDURE REPORT  PATIENT: Kathryn, [name]  MR#: [id number] BIRTHDATE: 04-Sep-1930 , 83  yrs. old GENDER: Female ENDOSCOPIST: Lafayette Dragon, MD REFERRED HF:WYOVZC Stephanie Acre, M.D. PROCEDURE DATE:  10/04/2013 PROCEDURE:   Colonoscopy with cold biopsy polypectomy First Screening Colonoscopy - Avg.  risk and is 50 yrs.  old or older - No.  Prior Negative Screening - Now for repeat screening. N/A  History of Adenoma - Now for follow-up colonoscopy & has been > or = to 3 yrs.  Yes hx of adenoma.  Has been 3 or more years since last colonoscopy.  Polyps Removed Today? Yes. ASA CLASS:   Class II INDICATIONS:adenomatous polyp in 2004 and in January 2009 colonoscopy. MEDICATIONS: MAC sedation, administered by CRNA and propofol (Diprivan) 200mg  IV  DESCRIPTION OF PROCEDURE:   After the risks benefits and alternatives of the procedure were thoroughly explained, informed consent was obtained.  A digital rectal exam revealed no abnormalities of the rectum.   The LB [id number]  endoscope was introduced through the anus and advanced to the cecum, which was identified by both the appendix and ileocecal valve. No adverse events experienced.   The quality of the prep was good, using MoviPrep  The instrument was then slowly withdrawn as the colon was fully examined.      COLON FINDINGS: A polypoid shaped sessile polyp ranging between 3-5mm in size was found in the sigmoid colon.  A polypectomy was performed with cold forceps.  The resection was complete and the polyp tissue was completely retrieved.   Small external hemorrhoids were found.  Retroflexed views revealed no abnormalities. The time to cecum=12.  minutes 11 seconds.  Withdrawal time=8 minutes 270 seconds.  The scope was withdrawn and the procedure completed. COMPLICATIONS: There were no complications.  ENDOSCOPIC IMPRESSION: 1.   Sessile polyp ranging  between 3-35mm in size was found in the sigmoid colon at 25 cm, polypectomy was performed with cold forceps  2.   Small external hemorrhoids , likely source of low volume hematochezia  RECOMMENDATIONS: 1.  Await biopsy results 2.  high fiber diet Rectal care including preparation H.  when necessary external hemorrhoids No recall colonoscopy due to age   eSigned:  Lafayette Dragon, MD 10/04/2013 3:43 PM   cc:

## 2013-10-04 NOTE — Progress Notes (Signed)
Pt. States her face became very "red" after taking prep lat evening.  Did not itch.

## 2013-10-04 NOTE — Progress Notes (Addendum)
Patient states that her abd hurts.   She had levsin, and passed large amounts.  Patient still telling Dr. Olevia Perches that she can't stand the gas pain.  She states that she has these at home, and she uses a lot of gas X.   Dr. Olevia Perches Arrived to speak with pateint.   She explained that she didn't find anything wrong with the patient that would be causing the pain..   Nothing else ordered.  Patient ready to be discharged.  States pain has lessoned #4.   Patient had this pain before coming to [place].  Patient smiling and thanking me for taking care of her.  Dr. Olevia Perches aware of situation and patient's bp.

## 2013-10-04 NOTE — Progress Notes (Signed)
Report to pacu rn, vss, bbs=clear 

## 2013-10-04 NOTE — Progress Notes (Signed)
Called to room to assist during endoscopic procedure.  Patient ID and intended procedure confirmed with present staff. Received instructions for my participation in the procedure from the performing physician.  

## 2013-10-04 NOTE — Patient Instructions (Signed)
YOU HAD AN ENDOSCOPIC PROCEDURE TODAY AT THE [facility]: Refer to the procedure report that was given to you for any specific questions about what was found during the examination.  If the procedure report does not answer your questions, please call your gastroenterologist to clarify.  If you requested that your care partner not be given the details of your procedure findings, then the procedure report has been included in a sealed envelope for you to review at your convenience later.  YOU SHOULD EXPECT: Some feelings of bloating in the abdomen. Passage of more gas than usual.  Walking can help get rid of the air that was put into your GI tract during the procedure and reduce the bloating. If you had a lower endoscopy (such as a colonoscopy or flexible sigmoidoscopy) you may notice spotting of blood in your stool or on the toilet paper. If you underwent a bowel prep for your procedure, then you may not have a normal bowel movement for a few days.  DIET: Your first meal following the procedure should be a light meal and then it is ok to progress to your normal diet.  A half-sandwich or bowl of soup is an example of a good first meal.  Heavy or fried foods are harder to digest and may make you feel nauseous or bloated.  Likewise meals heavy in dairy and vegetables can cause extra gas to form and this can also increase the bloating.  Drink plenty of fluids but you should avoid alcoholic beverages for 24 hours.  ACTIVITY: Your care partner should take you home directly after the procedure.  You should plan to take it easy, moving slowly for the rest of the day.  You can resume normal activity the day after the procedure however you should NOT DRIVE or use heavy machinery for 24 hours (because of the sedation medicines used during the test).    SYMPTOMS TO REPORT IMMEDIATELY: A gastroenterologist can be reached at any hour.  During normal business hours, 8:30 AM to 5:00 PM Monday through Friday,  call [phone].  After hours and on weekends, please call the GI answering service at [phone] who will take a message and have the physician on call contact you.   Following lower endoscopy (colonoscopy or flexible sigmoidoscopy):  Excessive amounts of blood in the stool  Significant tenderness or worsening of abdominal pains  Swelling of the abdomen that is new, acute  Fever of 100F or higher  FOLLOW UP: If any biopsies were taken you will be contacted by phone or by letter within the next 1-3 weeks.  Call your gastroenterologist if you have not heard about the biopsies in 3 weeks.  Our staff will call the home number listed on your records the next business day following your procedure to check on you and address any questions or concerns that you may have at that time regarding the information given to you following your procedure. This is a courtesy call and so if there is no answer at the home number and we have not heard from you through the emergency physician on call, we will assume that you have returned to your regular daily activities without incident.  SIGNATURES/CONFIDENTIALITY: You and/or your care partner have signed paperwork which will be entered into your electronic medical record.  These signatures attest to the fact that that the information above on your After Visit Summary has been reviewed and is understood.  Full responsibility of the confidentiality of this   discharge information lies with you and/or your care-partner.  Rectal care will include preparation H.

## 2013-10-05 ENCOUNTER — Telehealth: Payer: Self-pay | Admitting: *Deleted

## 2013-10-05 NOTE — Telephone Encounter (Signed)
  Follow up Call-  No flowsheet data found.   Patient questions:  Do you have a fever, pain , or abdominal swelling? no Pain Score  0   Have you tolerated food without any problems? yes  Have you been able to return to your normal activities? yes  Do you have any questions about your discharge instructions: Diet   no Medications  no Follow up visit  no  Do you have questions or concerns about your Care? no  Actions: * If pain score is 4 or above: No action needed, pain <4.  Pt states she passed some blood, a small amount when she got home, it had a small clot in it, pt will call back if she see anymore today. Pt denies pain states she was still gassy and took some gas-x, pt did c/o getting out of bed and her leg hurting this am which she took a pain pill for-advise pt to call back if she sees anymore blood or has any increase in pain.-adm

## 2013-10-10 ENCOUNTER — Encounter: Payer: Self-pay | Admitting: Internal Medicine

## 2013-10-10 ENCOUNTER — Telehealth: Payer: Self-pay | Admitting: Internal Medicine

## 2013-10-10 DIAGNOSIS — R109 Unspecified abdominal pain: Secondary | ICD-10-CM

## 2013-10-11 ENCOUNTER — Encounter: Payer: Self-pay | Admitting: *Deleted

## 2013-10-11 MED ORDER — GLYCOPYRROLATE 2 MG PO TABS: 2.0000 mg | ORAL_TABLET | Freq: Two times a day (BID) | ORAL | Status: DC

## 2013-10-11 NOTE — Telephone Encounter (Signed)
Tramadol 50 mg, #30, 1 po q am. 1 refill. Also change from Levsin SL.125 to [name] Forte 2 mg, 1 po bid, #60. Schedule pelvic ultrasound, please.

## 2013-10-11 NOTE — Telephone Encounter (Signed)
Patient is allergic to codeine and Vicodin. Allergy warning with Tramadol. Please, advise.

## 2013-10-11 NOTE — Telephone Encounter (Signed)
Rx sent to pharmacy. Scheduled US Pelvis at [facility] radiology on 10/13/13 at 9:30 AM.(Cheryl) Patient to drink 32 oz of water and come with a full bladder. Patient notified fo rx change and appointment, date, time and instructions.

## 2013-10-11 NOTE — Telephone Encounter (Signed)
Patient states she is still having "spasms of the stomach." Pain is in all across her lower abdomen. Levsin and GasEx is not helping. No fever. Does have nausea but no vomiting. States everything tastes salty. States she had 4 bowel movements on Sunday and 2 bowel movements yesterday. No diarrhea or constipation. Please, advise.

## 2013-10-11 NOTE — Telephone Encounter (Signed)
Cancel Tramadol, there is no substitute.

## 2013-10-13 ENCOUNTER — Ambulatory Visit (HOSPITAL_COMMUNITY)
Admission: RE | Admit: 2013-10-13 | Discharge: 2013-10-13 | Disposition: A | Discharge status: Home / Self Care | Payer: Medicare Other | Source: Ambulatory Visit | Attending: Internal Medicine | Admitting: Internal Medicine

## 2013-10-13 ENCOUNTER — Other Ambulatory Visit: Payer: Self-pay | Admitting: Internal Medicine

## 2013-10-13 DIAGNOSIS — R109 Unspecified abdominal pain: Secondary | ICD-10-CM

## 2013-10-21 ENCOUNTER — Encounter: Payer: Self-pay | Admitting: *Deleted

## 2013-11-15 ENCOUNTER — Encounter: Payer: Self-pay | Admitting: Internal Medicine

## 2013-11-15 ENCOUNTER — Ambulatory Visit (INDEPENDENT_AMBULATORY_CARE_PROVIDER_SITE_OTHER): Payer: Medicare Other | Admitting: Internal Medicine

## 2013-11-15 VITALS — BP 132/70 | HR 68 | Ht 61.5 in | Wt 132.4 lb

## 2013-11-15 DIAGNOSIS — R1032 Left lower quadrant pain: Secondary | ICD-10-CM

## 2013-11-15 DIAGNOSIS — K589 Irritable bowel syndrome without diarrhea: Secondary | ICD-10-CM

## 2013-11-15 DIAGNOSIS — R339 Retention of urine, unspecified: Secondary | ICD-10-CM

## 2013-11-15 MED ORDER — HYOSCYAMINE SULFATE 0.125 MG SL SUBL: 0.1250 mg | SUBLINGUAL_TABLET | Freq: Four times a day (QID) | SUBLINGUAL | Status: DC | PRN

## 2013-11-15 NOTE — Patient Instructions (Addendum)
Please discontinue taking [name].  We have sent the following medications to your pharmacy for you to pick up at your convenience: [place] have been scheduled for an appointment with Dr Diona Fanti @ [facility] on 12/14/13 @ 12:45 pm.  CC:Dr Jonathon Jordan, Dr Diona Fanti

## 2013-11-15 NOTE — Progress Notes (Signed)
Kathryn [name] 06/23/1931 [id number]  Note: This dictation was prepared with Dragon digital system. Any transcriptional errors that result from this procedure are unintentional.   History of Present Illness:  This is an 80 year old white female with lower abdominal pain which wakes her up in the morning. She has urinary hesitancy. She had an appointment with Dr.Dahlstedt recently but missed it because she had a fall and injured her back. Patient has had a GI evaluation including a colonoscopy in February 2015 which showed a tubular adenoma. She had a pelvic ultrasound in December 2014 which did not show any active pprocess. She is status post total abdominal hysterectomy. A CT scan of the abdomen last summer showed fatty liver but no acute findings. She has mild constipation. She has been on [name] Forte 2 mg by mouth twice a day which may be contributing to the urinary retention.    Past Medical History  Diagnosis Date  . Depression   . Hypertension   . High cholesterol   . Rectal prolapse   . Hx of adenomatous colonic polyps   . Internal hemorrhoids   . Hypothyroidism   . Fecal incontinence   . TIA (transient ischemic attack)   . Stroke , sept 1, 2010sept 12, 2011    x 2  . History of frequent urinary tract infections     recent  . Migraine   . Fatty liver 03/20/13    Past Surgical History  Procedure Laterality Date  . Colon surgery  2010, for prolapsed organs after hystecrtomy surgery  . Appendectomy  2008  . Abdominal hysterectomy  2010  . Cholecystectomy    . Lumbar laminectomy/decompression microdiscectomy N/A 01/07/2013    Procedure: LUMBAR LAMINECTOMY CENTRAL DECOMPRESSION L4-L5, BILATERAL FORAMENOTOMY L4,L5    ;  Surgeon: Tobi Bastos, MD;  Location: [facility];  Service: Orthopedics;  Laterality: N/A;    Allergies  Allergen Reactions  . Augmentin [Amoxicillin-Pot Clavulanate] Swelling    Throat   . Citalopram Swelling    Throat and eyes   . Codeine Anaphylaxis   . Fish-Derived Products Anaphylaxis  . Ibuprofen Swelling    Throat and eyes   . Latex Swelling and Rash    Swelling to troat  . Lipitor [Atorvastatin] Swelling    throat  . Septra [Bactrim] Anaphylaxis  . Shellfish Allergy Anaphylaxis  . Keflet [Cephalexin] Diarrhea    Upset stomach  . Vicodin [Hydrocodone-Acetaminophen] Other (See Comments)    stroke  . Ciprofloxacin Nausea And Vomiting  . Depakene [Valproate Sodium] Hives    All over body   . Isoptin Sr [Verapamil Hcl Er] Cough  . Lisinopril Cough  . Miconazole Rash    Family history and social history have been reviewed.  Review of Systems: Denies rectal bleeding chest pain nausea vomiting  The remainder of the 10 point ROS is negative except as outlined in the H&P  Physical Exam: General Appearance Well developed, in no distress Eyes  Non icteric  HEENT  Non traumatic, normocephalic  Mouth No lesion, tongue papillated, no cheilosis Neck Supple without adenopathy, thyroid not enlarged, no carotid bruits, no JVD Lungs Clear to auscultation bilaterally COR Normal S1, normal S2, regular rhythm, no murmur, quiet precordium Abdomen Soft nontender with normoactive bowel sounds. Bladder not distended.  Rectal Not done  Extremities  No pedal edema Skin No lesions Neurological Alert and oriented x 3 Psychological Normal mood and affect  Assessment and Plan:   Problem #51 80 year old white female with persistent suprapubic discomfort  which may be related to bladder distention. We will make an appointment for her to see the urologist again. Urinary causes of abdominal pain needs to be ruled out. She will continue on a stool softener. She will discontinue the antispasmodic, [name] Forte which may be contributing to the urinary retention.    Kathryn [name] 11/15/2013

## 2013-11-17 ENCOUNTER — Telehealth: Payer: Self-pay | Admitting: Internal Medicine

## 2013-11-17 MED ORDER — GLYCOPYRROLATE 2 MG PO TABS: ORAL_TABLET | ORAL | Status: DC

## 2013-11-17 NOTE — Telephone Encounter (Signed)
Please refill [name] forte 2mg , #60, 1 po bid,3 refills

## 2013-11-17 NOTE — Telephone Encounter (Signed)
Patient tried the hyoscyamine and it made her sick, weak, blurred vision, felt like she was going to pass out. She did not take anymore of it. Last night, her stomach was hurting so she took [name] and felt better. She is asking for rx for [name]. Please, advise.

## 2013-11-17 NOTE — Telephone Encounter (Signed)
Rx sent to pharmacy. Patient notified. 

## 2013-12-20 ENCOUNTER — Telehealth: Payer: Self-pay | Admitting: Internal Medicine

## 2013-12-20 NOTE — Telephone Encounter (Signed)
ERROR

## 2013-12-27 ENCOUNTER — Ambulatory Visit (INDEPENDENT_AMBULATORY_CARE_PROVIDER_SITE_OTHER): Payer: Medicare Other | Admitting: Internal Medicine

## 2013-12-27 ENCOUNTER — Encounter: Payer: Self-pay | Admitting: Internal Medicine

## 2013-12-27 VITALS — BP 152/62 | HR 60 | Ht 61.5 in | Wt 134.0 lb

## 2013-12-27 DIAGNOSIS — M25559 Pain in unspecified hip: Secondary | ICD-10-CM

## 2013-12-27 DIAGNOSIS — R1033 Periumbilical pain: Secondary | ICD-10-CM

## 2013-12-27 NOTE — Patient Instructions (Signed)
Dr Sharon Wolters 

## 2013-12-27 NOTE — Progress Notes (Signed)
Kathryn [name] 03-17-1931 [id number]  Note: This dictation was prepared with Dragon digital system. Any transcriptional errors that result from this procedure are unintentional.   History of Present Illness: This is a 80 year old white female with chronic lower abdominal pain  across  lower abdomen which wakes her up in the mornings. She just had a urologic evaluation by Dr. Diona Fanti and was found to have for 1+ cystocele and residuals urine of 53 cc. He did not feel that  her abdominal pain is  urological  She had a negative pelvic ultrasound in December 2014 which showed status post total abdominal hysterectomy. Recent colonoscopy in February 2015 showed tubular adenoma. She denies being constipated. She takes tramadol 50 mg 3 times a day and [name] Forte 2 mg when necessary. Her weight has been stable. She had  rectal surgery in the past for rectocele. CT scan of the abdomen in July 2014 showed fatty liver, small hepatic hemangioma and surgical anastomosis in the rectum    Past Medical History  Diagnosis Date  . Depression   . Hypertension   . High cholesterol   . Rectal prolapse   . Hx of adenomatous colonic polyps   . Internal hemorrhoids   . Hypothyroidism   . Fecal incontinence   . TIA (transient ischemic attack)   . Stroke , sept 1, 2010sept 12, 2011    x 2  . History of frequent urinary tract infections     recent  . Migraine   . Fatty liver 03/20/13    Past Surgical History  Procedure Laterality Date  . Colon surgery  2010, for prolapsed organs after hystecrtomy surgery  . Appendectomy  2008  . Abdominal hysterectomy  2010  . Cholecystectomy    . Lumbar laminectomy/decompression microdiscectomy N/A 01/07/2013    Procedure: LUMBAR LAMINECTOMY CENTRAL DECOMPRESSION L4-L5, BILATERAL FORAMENOTOMY L4,L5    ;  Surgeon: Tobi Bastos, MD;  Location: [facility];  Service: Orthopedics;  Laterality: N/A;    Allergies  Allergen Reactions  . Augmentin [Amoxicillin-Pot Clavulanate]  Swelling    Throat   . Citalopram Swelling    Throat and eyes   . Codeine Anaphylaxis  . Fish-Derived Products Anaphylaxis  . Ibuprofen Swelling    Throat and eyes   . Latex Swelling and Rash    Swelling to troat  . Lipitor [Atorvastatin] Swelling    throat  . Septra [Bactrim] Anaphylaxis  . Shellfish Allergy Anaphylaxis  . Keflet [Cephalexin] Diarrhea    Upset stomach  . Vicodin [Hydrocodone-Acetaminophen] Other (See Comments)    stroke  . Ciprofloxacin Nausea And Vomiting  . Depakene [Valproate Sodium] Hives    All over body   . Isoptin Sr [Verapamil Hcl Er] Cough  . Lisinopril Cough  . Miconazole Rash    Family history and social history have been reviewed.  Review of Systems:   The remainder of the 10 point ROS is negative except as outlined in the H&P  Physical Exam: General Appearance Well developed, in no distress Eyes  Non icteric  HEENT  Non traumatic, normocephalic  Mouth No lesion, tongue papillated, no cheilosis Neck Supple without adenopathy, thyroid not enlarged, no carotid bruits, no JVD Lungs Clear to auscultation bilaterally COR Normal S1, normal S2, regular rhythm, no murmur, quiet precordium Abdomen soft with minimal tenderness across the lower abdomen. No palpable mass or rebound. Normoactive bowel sounds Rectal not repeated Extremities  No pedal edema Skin No lesions Neurological Alert and oriented x 3 Psychological Normal mood  and affect  Assessment and Plan:   80 year old white female with chronic pelvic pain with negative urological as well as GI workup. Possible functional pelvic pain. She is doing well with tramadol and with the [name] Forte. No further evaluation is planned at this time. We will see her on a when necessary basis    Lafayette Dragon 12/27/2013

## 2014-03-24 ENCOUNTER — Other Ambulatory Visit: Payer: Self-pay | Admitting: Internal Medicine

## 2014-04-17 ENCOUNTER — Telehealth: Payer: Self-pay | Admitting: Internal Medicine

## 2014-04-17 NOTE — Telephone Encounter (Signed)
Spoke with patient's caregiver and she states patient is c/o abdominal pain from the left side that goes to the right side just below belly button. States the pain is getting worse. She reports the patient is weak and falls a lot. She also reports her memory is bad. Caregiver thinks the patient is taking her medications like Dr. Olevia Perches ordered but cannot be sure. Not sure if she should see Dr. Olevia Perches or someone else. Please, advise

## 2014-04-18 NOTE — Telephone Encounter (Signed)
I think her pain is functional. She has pelvic relaxation. I welcome second opinion. Will send records to GI MD of her choice.

## 2014-04-19 NOTE — Telephone Encounter (Signed)
Recommendations given to caregiver.

## 2014-05-19 ENCOUNTER — Emergency Department (HOSPITAL_COMMUNITY)
Admission: EM | Admit: 2014-05-19 | Discharge: 2014-05-20 | Disposition: A | Discharge status: Home / Self Care | Payer: Medicare Other | Attending: Emergency Medicine | Admitting: Emergency Medicine

## 2014-05-19 ENCOUNTER — Encounter (HOSPITAL_COMMUNITY): Payer: Self-pay | Admitting: Emergency Medicine

## 2014-05-19 DIAGNOSIS — R1032 Left lower quadrant pain: Secondary | ICD-10-CM | POA: Insufficient documentation

## 2014-05-19 DIAGNOSIS — Z860100 Personal history of colon polyps, unspecified: Secondary | ICD-10-CM | POA: Insufficient documentation

## 2014-05-19 DIAGNOSIS — R51 Headache: Secondary | ICD-10-CM | POA: Insufficient documentation

## 2014-05-19 DIAGNOSIS — R109 Unspecified abdominal pain: Secondary | ICD-10-CM

## 2014-05-19 DIAGNOSIS — Z87440 Personal history of urinary (tract) infections: Secondary | ICD-10-CM | POA: Insufficient documentation

## 2014-05-19 DIAGNOSIS — Z8719 Personal history of other diseases of the digestive system: Secondary | ICD-10-CM | POA: Diagnosis not present

## 2014-05-19 DIAGNOSIS — E871 Hypo-osmolality and hyponatremia: Secondary | ICD-10-CM | POA: Diagnosis not present

## 2014-05-19 DIAGNOSIS — Z7982 Long term (current) use of aspirin: Secondary | ICD-10-CM | POA: Insufficient documentation

## 2014-05-19 DIAGNOSIS — Z79899 Other long term (current) drug therapy: Secondary | ICD-10-CM | POA: Diagnosis not present

## 2014-05-19 DIAGNOSIS — R519 Headache, unspecified: Secondary | ICD-10-CM

## 2014-05-19 DIAGNOSIS — F329 Major depressive disorder, single episode, unspecified: Secondary | ICD-10-CM | POA: Insufficient documentation

## 2014-05-19 DIAGNOSIS — Z91040 Latex allergy status: Secondary | ICD-10-CM | POA: Insufficient documentation

## 2014-05-19 DIAGNOSIS — Z8673 Personal history of transient ischemic attack (TIA), and cerebral infarction without residual deficits: Secondary | ICD-10-CM | POA: Diagnosis not present

## 2014-05-19 DIAGNOSIS — I10 Essential (primary) hypertension: Secondary | ICD-10-CM | POA: Diagnosis not present

## 2014-05-19 DIAGNOSIS — R197 Diarrhea, unspecified: Secondary | ICD-10-CM | POA: Insufficient documentation

## 2014-05-19 DIAGNOSIS — G43909 Migraine, unspecified, not intractable, without status migrainosus: Secondary | ICD-10-CM | POA: Diagnosis not present

## 2014-05-19 DIAGNOSIS — F3289 Other specified depressive episodes: Secondary | ICD-10-CM | POA: Diagnosis not present

## 2014-05-19 DIAGNOSIS — E7800 Pure hypercholesterolemia, unspecified: Secondary | ICD-10-CM | POA: Insufficient documentation

## 2014-05-19 DIAGNOSIS — IMO0002 Reserved for concepts with insufficient information to code with codable children: Secondary | ICD-10-CM | POA: Insufficient documentation

## 2014-05-19 LAB — COMPREHENSIVE METABOLIC PANEL
ALBUMIN: 4.3 g/dL (ref 3.5–5.2)
ALK PHOS: 62 U/L (ref 39–117)
ALT: 11 U/L (ref 0–35)
ANION GAP: 15 (ref 5–15)
AST: 19 U/L (ref 0–37)
BUN: 13 mg/dL (ref 6–23)
CALCIUM: 10.1 mg/dL (ref 8.4–10.5)
CO2: 25 mEq/L (ref 19–32)
Chloride: 87 mEq/L — ABNORMAL LOW (ref 96–112)
Creatinine, Ser: 0.69 mg/dL (ref 0.50–1.10)
GFR calc non Af Amer: 79 mL/min — ABNORMAL LOW (ref >90)
GLUCOSE: 101 mg/dL — AB (ref 70–99)
POTASSIUM: 3.9 meq/L (ref 3.7–5.3)
Sodium: 127 mEq/L — ABNORMAL LOW (ref 137–147)
TOTAL PROTEIN: 7.7 g/dL (ref 6.0–8.3)
Total Bilirubin: 1 mg/dL (ref 0.3–1.2)

## 2014-05-19 LAB — URINALYSIS, ROUTINE W REFLEX MICROSCOPIC
Bilirubin Urine: NEGATIVE
Glucose, UA: NEGATIVE mg/dL
Hgb urine dipstick: NEGATIVE
Ketones, ur: NEGATIVE mg/dL
Leukocytes, UA: NEGATIVE
NITRITE: NEGATIVE
PH: 7 (ref 5.0–8.0)
Protein, ur: NEGATIVE mg/dL
SPECIFIC GRAVITY, URINE: 1.008 (ref 1.005–1.030)
UROBILINOGEN UA: 0.2 mg/dL (ref 0.0–1.0)

## 2014-05-19 LAB — CBC WITH DIFFERENTIAL/PLATELET
BASOS PCT: 0 % (ref 0–1)
Basophils Absolute: 0 10*3/uL (ref 0.0–0.1)
EOS ABS: 0.2 10*3/uL (ref 0.0–0.7)
EOS PCT: 3 % (ref 0–5)
HCT: 37.2 % (ref 36.0–46.0)
Hemoglobin: 13.1 g/dL (ref 12.0–15.0)
LYMPHS ABS: 1.4 10*3/uL (ref 0.7–4.0)
Lymphocytes Relative: 25 % (ref 12–46)
MCH: 31.3 pg (ref 26.0–34.0)
MCHC: 35.2 g/dL (ref 30.0–36.0)
MCV: 89 fL (ref 78.0–100.0)
Monocytes Absolute: 0.6 10*3/uL (ref 0.1–1.0)
Monocytes Relative: 10 % (ref 3–12)
NEUTROS PCT: 62 % (ref 43–77)
Neutro Abs: 3.4 10*3/uL (ref 1.7–7.7)
Platelets: 213 10*3/uL (ref 150–400)
RBC: 4.18 MIL/uL (ref 3.87–5.11)
RDW: 12.2 % (ref 11.5–15.5)
WBC: 5.5 10*3/uL (ref 4.0–10.5)

## 2014-05-19 MED ORDER — SODIUM CHLORIDE 0.9 % IV BOLUS (SEPSIS)
1000.0000 mL | Freq: Once | INTRAVENOUS | Status: AC
  Administered 2014-05-19: 1000 mL via INTRAVENOUS

## 2014-05-19 MED ORDER — HYDROCORTISONE 1 % EX CREA
TOPICAL_CREAM | Freq: Once | CUTANEOUS | Status: AC
  Administered 2014-05-20: 01:00:00 via TOPICAL
  Filled 2014-05-19: qty 28

## 2014-05-19 NOTE — ED Notes (Signed)
Pt. reports elevated blood pressure this afternoon at home 172/91 with headache , diarrhea onset 2 days ago and low abdominal cramping , denies nausea or vomitting , no fever or chills.

## 2014-05-19 NOTE — ED Provider Notes (Signed)
CSN: [id number]     Arrival date & time 05/19/14  2297 History   First MD Initiated Contact with Patient 05/19/14 2219     Chief Complaint  Patient presents with  . Headache  . Diarrhea  . Hypertension     (Consider location/radiation/quality/duration/timing/severity/associated sxs/prior Treatment) HPI Pt is an 80yo female with hx of depression, HTN, high cholesterol, rectal prolapse, internal hemorrhoids, hypothyroidism, fecal incontinence, TIA, stroke x2, hx of frequent UTIs, migraines, fatty liver, and IBS presenting to ED with c/o lower abdominal pain and cramping associated with diarrhea. Abdominal pain is cramping and sharp, 7/10 at worst.  Pt states she has had about 3-4 episodes of watery diarrhea the last 2 days. States she normally only has 1-2 BMs per day.  Pt also c/o diffuse mild headache associated with elevated BP at home of 172/91.  Pt states she is suppose to f/u with Dr. Olevia Perches, GI, however, she has not been able to f/u due to not feeling well and unable to get appropriate transportation over last few weeks.  States she has been taking her medications as prescribed. Denies fever, chills or urinary symptoms.    Past Medical History  Diagnosis Date  . Depression   . Hypertension   . High cholesterol   . Rectal prolapse   . Hx of adenomatous colonic polyps   . Internal hemorrhoids   . Hypothyroidism   . Fecal incontinence   . TIA (transient ischemic attack)   . Stroke , sept 1, 2010sept 12, 2011    x 2  . History of frequent urinary tract infections     recent  . Migraine   . Fatty liver 03/20/13   Past Surgical History  Procedure Laterality Date  . Colon surgery  2010, for prolapsed organs after hystecrtomy surgery  . Appendectomy  2008  . Abdominal hysterectomy  2010  . Cholecystectomy    . Lumbar laminectomy/decompression microdiscectomy N/A 01/07/2013    Procedure: LUMBAR LAMINECTOMY CENTRAL DECOMPRESSION L4-L5, BILATERAL FORAMENOTOMY L4,L5    ;  Surgeon:  Tobi Bastos, MD;  Location: [facility];  Service: Orthopedics;  Laterality: N/A;   Family History  Problem Relation Age of Onset  . Stroke Father   . Migraines Father   . CVA Father   . Heart attack Father   . Hypertension Maternal Aunt     x3  . Colon cancer Neg Hx   . CVA Mother    History  Substance Use Topics  . Smoking status: Never Smoker   . Smokeless tobacco: Never Used  . Alcohol Use: No   OB History   Grav Para Term Preterm Abortions TAB SAB Ect Mult Living                 Review of Systems  Constitutional: Negative for fever and chills.  Respiratory: Negative for cough and shortness of breath.   Cardiovascular: Negative for chest pain and palpitations.  Gastrointestinal: Positive for abdominal pain and diarrhea. Negative for nausea, vomiting, constipation and anal bleeding.  Genitourinary: Negative for dysuria, urgency, hematuria and flank pain.  Musculoskeletal: Positive for back pain ( chronic). Negative for myalgias.  Neurological: Positive for headaches. Negative for dizziness and light-headedness.  All other systems reviewed and are negative.     Allergies  Augmentin; Citalopram; Codeine; Fish-derived products; Ibuprofen; Latex; Lipitor; Septra; Shellfish allergy; Keflet; Vicodin; Ciprofloxacin; Depakene; Isoptin sr; Lisinopril; and Miconazole  Home Medications   Prior to Admission medications   Medication Sig Start Date End  Date Taking? Authorizing Provider  amLODipine (NORVASC) 2.5 MG tablet Take 2.5 mg by mouth daily.     Historical Provider, MD  aspirin EC 81 MG tablet Take 81 mg by mouth daily.    Historical Provider, MD  Calcium Carbonate-Vitamin D (CALCIUM + D PO) Take 1 tablet by mouth daily.    Historical Provider, MD  clonazePAM (KLONOPIN) 1 MG tablet Take 0.5-1 mg by mouth at bedtime as needed for anxiety.  01/23/12   Lafayette Dragon, MD  diazepam (VALIUM) 2 MG tablet Take 2 mg by mouth every 6 (six) hours as needed. For anxiety    Historical  Provider, MD  Estradiol 10 MCG TABS vaginal tablet Insert 1 tablet into vagina daily x 2 weeks. Then, insert 1 tablet into vagina twice weekly thereafter. 08/09/13   Lafayette Dragon, MD  fluconazole (DIFLUCAN) 150 MG tablet Take 150 mg by mouth daily. 1 tablet the repeat in 3 days    Historical Provider, MD  fluticasone (FLONASE) 50 MCG/ACT nasal spray Place 2 sprays into the nose daily.    Historical Provider, MD  glycopyrrolate ([name]) 2 MG tablet TAKE ONE TABLET BY MOUTH TWICE DAILY 03/24/14   Lafayette Dragon, MD  losartan (COZAAR) 50 MG tablet Take 1 tablet (50 mg total) by mouth every morning. 05/22/13   Belkys A Regalado, MD  pantoprazole (PROTONIX) 40 MG tablet Take 1 tablet (40 mg total) by mouth every morning. 04/18/13   Rebecca Eaton, MD  PRESCRIPTION MEDICATION Pt gets allergy shots once weekly. Dr Donneta Romberg.    Historical Provider, MD  propranolol (INDERAL) 40 MG tablet Take 40 mg by mouth 3 (three) times daily.    Historical Provider, MD  simvastatin (ZOCOR) 40 MG tablet Take 40 mg by mouth every morning.     Historical Provider, MD  traMADol-acetaminophen (ULTRACET) 37.5-325 MG per tablet Take 1 tablet by mouth every 4 (four) hours as needed for pain.  04/04/13   Historical Provider, MD  vitamin C (ASCORBIC ACID) 500 MG tablet Take 500 mg by mouth daily.    Historical Provider, MD  vitamin E 200 UNIT capsule Take 200 Units by mouth daily.     Historical Provider, MD  zolpidem (AMBIEN CR) 12.5 MG CR tablet Take 12.5 mg by mouth at bedtime.    Historical Provider, MD   BP 177/74  Pulse 77  Temp(Src) 98.9 F (37.2 C) (Oral)  Resp 16  SpO2 100% Physical Exam  Nursing note and vitals reviewed. Constitutional: She appears well-developed and well-nourished. No distress.  Elderly female lying comfortably in exam bed, NAD  HENT:  Head: Normocephalic and atraumatic.  Eyes: Conjunctivae are normal. No scleral icterus.  Neck: Normal range of motion.  Cardiovascular: Normal rate, regular  rhythm and normal heart sounds.   Pulmonary/Chest: Effort normal and breath sounds normal. No respiratory distress. She has no wheezes. She has no rales. She exhibits no tenderness.  No respiratory distress, able to speak in full sentences w/o difficulty.  Abdominal: Soft. Bowel sounds are normal. She exhibits no distension and no mass. There is tenderness ( lower abdomen, worse in LLQ). There is no rebound and no guarding.  Musculoskeletal: Normal range of motion.  Neurological: She is alert.  Skin: Skin is warm and dry. She is not diaphoretic.    ED Course  Procedures (including critical care time) Labs Review Labs Reviewed  COMPREHENSIVE METABOLIC PANEL - Abnormal; Notable for the following:    Sodium 127 (*)    Chloride  87 (*)    Glucose, Bld 101 (*)    GFR calc non Af Amer 79 (*)    All other components within normal limits  STOOL CULTURE  CBC WITH DIFFERENTIAL  URINALYSIS, ROUTINE W REFLEX MICROSCOPIC    Imaging Review No results found.   EKG Interpretation   Date/Time:  Friday May 19 2014 23:00:47 EDT Ventricular Rate:  62 PR Interval:  207 QRS Duration: 88 QT Interval:  434 QTC Calculation: 441 R Axis:   32 Text Interpretation:  Sinus rhythm Probable anteroseptal infarct, old No  acute changes Confirmed by Kathrynn Humble, MD, Thelma Comp [phone]) on 05/19/2014  11:41:10 PM      MDM   Final diagnoses:  Abdominal cramping  Diarrhea  Acute nonintractable headache, unspecified headache type  Hyponatremia    Pt is an 80yo female with hx of IBS presenting to ED with c/o lower abdominal pain and diarrhea. Pt also c/o headache. Vitals: unremarkable.  Symptoms appear chronic in nature. Pt appears well, non-toxic, NAD. Afebrile. Mild lower abdominal tenderness. Labs: mild hyponatremia, 127, hx of same at 128, otherwise unremarkable. Pt given IV fluids.  Pt discussed with Dr. Kathrynn Humble who also examined pt. Pt is to be discharged home to f/u with Dr. Olevia Perches GI. No emergent  process appears to be taking place at this time.  Return precautions provided. Pt verbalized understanding and agreement with tx plan.     Noland Fordyce, PA-C 05/20/14 [phone]

## 2014-05-20 DIAGNOSIS — R51 Headache: Secondary | ICD-10-CM | POA: Diagnosis not present

## 2014-05-20 NOTE — ED Provider Notes (Signed)
Medical screening examination/treatment/procedure(s) were conducted as a shared visit with non-physician practitioner(s) and myself.  I personally evaluated the patient during the encounter.   EKG Interpretation   Date/Time:  Friday May 19 2014 23:00:47 EDT Ventricular Rate:  62 PR Interval:  207 QRS Duration: 88 QT Interval:  434 QTC Calculation: 441 R Axis:   32 Text Interpretation:  Sinus rhythm Probable anteroseptal infarct, old No  acute changes Confirmed by Kathrynn Humble, MD, Thelma Comp [phone]) on 05/19/2014  11:41:10 PM      Pt comes in with abd pain. Chronic abd pain, non focal really per hx, on exam, LLQ is the worst, along with the rest of the lower quadrants. CT scan, EGD reviewed, all within the last 1 year  - no diverticular disease, no vasculitis like picture. Labs are WNL, except mild hyponatremia. Asked her to continue to see GI.   Varney Biles, MD 05/20/14 2324

## 2014-05-20 NOTE — ED Notes (Signed)
Pt A&OX4, wheeled out of ED via wheelchair, NAD. 

## 2014-06-01 ENCOUNTER — Other Ambulatory Visit: Payer: Self-pay | Admitting: Gastroenterology

## 2014-06-01 DIAGNOSIS — R1084 Generalized abdominal pain: Secondary | ICD-10-CM

## 2014-06-06 ENCOUNTER — Other Ambulatory Visit: Payer: Medicare Other

## 2014-06-07 ENCOUNTER — Ambulatory Visit
Admission: RE | Admit: 2014-06-07 | Discharge: 2014-06-07 | Disposition: A | Discharge status: Home / Self Care | Payer: Medicare Other | Source: Ambulatory Visit | Attending: Gastroenterology | Admitting: Gastroenterology

## 2014-06-07 DIAGNOSIS — R1084 Generalized abdominal pain: Secondary | ICD-10-CM

## 2014-06-07 MED ORDER — IOHEXOL 300 MG/ML  SOLN
100.0000 mL | Freq: Once | INTRAMUSCULAR | Status: AC | PRN
  Administered 2014-06-07: 100 mL via INTRAVENOUS

## 2014-06-16 ENCOUNTER — Emergency Department (HOSPITAL_COMMUNITY)
Admission: EM | Admit: 2014-06-16 | Discharge: 2014-06-16 | Disposition: A | Discharge status: Home / Self Care | Payer: Medicare Other | Attending: Emergency Medicine | Admitting: Emergency Medicine

## 2014-06-16 ENCOUNTER — Encounter (HOSPITAL_COMMUNITY): Payer: Self-pay | Admitting: Emergency Medicine

## 2014-06-16 ENCOUNTER — Emergency Department (HOSPITAL_COMMUNITY): Payer: Medicare Other

## 2014-06-16 DIAGNOSIS — Z79818 Long term (current) use of other agents affecting estrogen receptors and estrogen levels: Secondary | ICD-10-CM | POA: Diagnosis not present

## 2014-06-16 DIAGNOSIS — Z91040 Latex allergy status: Secondary | ICD-10-CM | POA: Diagnosis not present

## 2014-06-16 DIAGNOSIS — Z9049 Acquired absence of other specified parts of digestive tract: Secondary | ICD-10-CM | POA: Diagnosis not present

## 2014-06-16 DIAGNOSIS — R109 Unspecified abdominal pain: Secondary | ICD-10-CM

## 2014-06-16 DIAGNOSIS — Z79899 Other long term (current) drug therapy: Secondary | ICD-10-CM | POA: Insufficient documentation

## 2014-06-16 DIAGNOSIS — F329 Major depressive disorder, single episode, unspecified: Secondary | ICD-10-CM | POA: Diagnosis not present

## 2014-06-16 DIAGNOSIS — I10 Essential (primary) hypertension: Secondary | ICD-10-CM | POA: Diagnosis not present

## 2014-06-16 DIAGNOSIS — Z87440 Personal history of urinary (tract) infections: Secondary | ICD-10-CM | POA: Diagnosis not present

## 2014-06-16 DIAGNOSIS — R197 Diarrhea, unspecified: Secondary | ICD-10-CM | POA: Diagnosis not present

## 2014-06-16 DIAGNOSIS — Z86010 Personal history of colonic polyps: Secondary | ICD-10-CM | POA: Insufficient documentation

## 2014-06-16 DIAGNOSIS — G43909 Migraine, unspecified, not intractable, without status migrainosus: Secondary | ICD-10-CM | POA: Insufficient documentation

## 2014-06-16 DIAGNOSIS — Z90710 Acquired absence of both cervix and uterus: Secondary | ICD-10-CM | POA: Insufficient documentation

## 2014-06-16 DIAGNOSIS — Z8673 Personal history of transient ischemic attack (TIA), and cerebral infarction without residual deficits: Secondary | ICD-10-CM | POA: Insufficient documentation

## 2014-06-16 DIAGNOSIS — Z7982 Long term (current) use of aspirin: Secondary | ICD-10-CM | POA: Insufficient documentation

## 2014-06-16 DIAGNOSIS — E780 Pure hypercholesterolemia: Secondary | ICD-10-CM | POA: Insufficient documentation

## 2014-06-16 DIAGNOSIS — R1084 Generalized abdominal pain: Secondary | ICD-10-CM | POA: Diagnosis present

## 2014-06-16 DIAGNOSIS — Z9889 Other specified postprocedural states: Secondary | ICD-10-CM | POA: Insufficient documentation

## 2014-06-16 DIAGNOSIS — R1011 Right upper quadrant pain: Secondary | ICD-10-CM | POA: Insufficient documentation

## 2014-06-16 DIAGNOSIS — Z7951 Long term (current) use of inhaled steroids: Secondary | ICD-10-CM | POA: Diagnosis not present

## 2014-06-16 DIAGNOSIS — Z8719 Personal history of other diseases of the digestive system: Secondary | ICD-10-CM | POA: Insufficient documentation

## 2014-06-16 DIAGNOSIS — Z880 Allergy status to penicillin: Secondary | ICD-10-CM | POA: Insufficient documentation

## 2014-06-16 DIAGNOSIS — G8929 Other chronic pain: Secondary | ICD-10-CM | POA: Diagnosis not present

## 2014-06-16 DIAGNOSIS — R1032 Left lower quadrant pain: Secondary | ICD-10-CM | POA: Diagnosis not present

## 2014-06-16 LAB — COMPREHENSIVE METABOLIC PANEL
ALK PHOS: 56 U/L (ref 39–117)
ALT: 10 U/L (ref 0–35)
AST: 15 U/L (ref 0–37)
Albumin: 3.7 g/dL (ref 3.5–5.2)
Anion gap: 13 (ref 5–15)
BUN: 9 mg/dL (ref 6–23)
CALCIUM: 9.7 mg/dL (ref 8.4–10.5)
CO2: 26 meq/L (ref 19–32)
Chloride: 92 mEq/L — ABNORMAL LOW (ref 96–112)
Creatinine, Ser: 0.65 mg/dL (ref 0.50–1.10)
GFR calc Af Amer: >90 mL/min (ref >90)
GFR calc non Af Amer: 81 mL/min — ABNORMAL LOW (ref >90)
Glucose, Bld: 105 mg/dL — ABNORMAL HIGH (ref 70–99)
POTASSIUM: 4.3 meq/L (ref 3.7–5.3)
SODIUM: 131 meq/L — AB (ref 137–147)
TOTAL PROTEIN: 7.1 g/dL (ref 6.0–8.3)
Total Bilirubin: 0.6 mg/dL (ref 0.3–1.2)

## 2014-06-16 LAB — URINALYSIS, ROUTINE W REFLEX MICROSCOPIC
Bilirubin Urine: NEGATIVE
Glucose, UA: NEGATIVE mg/dL
Hgb urine dipstick: NEGATIVE
Ketones, ur: NEGATIVE mg/dL
LEUKOCYTES UA: NEGATIVE
Nitrite: NEGATIVE
Protein, ur: NEGATIVE mg/dL
Specific Gravity, Urine: 1.004 — ABNORMAL LOW (ref 1.005–1.030)
Urobilinogen, UA: 0.2 mg/dL (ref 0.0–1.0)
pH: 7.5 (ref 5.0–8.0)

## 2014-06-16 LAB — CBC WITH DIFFERENTIAL/PLATELET
Basophils Absolute: 0 10*3/uL (ref 0.0–0.1)
Basophils Relative: 1 % (ref 0–1)
Eosinophils Absolute: 0.1 10*3/uL (ref 0.0–0.7)
Eosinophils Relative: 2 % (ref 0–5)
HCT: 35.6 % — ABNORMAL LOW (ref 36.0–46.0)
Hemoglobin: 12.2 g/dL (ref 12.0–15.0)
LYMPHS PCT: 23 % (ref 12–46)
Lymphs Abs: 0.8 10*3/uL (ref 0.7–4.0)
MCH: 30.9 pg (ref 26.0–34.0)
MCHC: 34.3 g/dL (ref 30.0–36.0)
MCV: 90.1 fL (ref 78.0–100.0)
Monocytes Absolute: 0.5 10*3/uL (ref 0.1–1.0)
Monocytes Relative: 14 % — ABNORMAL HIGH (ref 3–12)
NEUTROS PCT: 60 % (ref 43–77)
Neutro Abs: 2.2 10*3/uL (ref 1.7–7.7)
PLATELETS: 234 10*3/uL (ref 150–400)
RBC: 3.95 MIL/uL (ref 3.87–5.11)
RDW: 12.5 % (ref 11.5–15.5)
WBC: 3.6 10*3/uL — AB (ref 4.0–10.5)

## 2014-06-16 LAB — LIPASE, BLOOD: Lipase: 31 U/L (ref 11–59)

## 2014-06-16 MED ORDER — TRAMADOL HCL 50 MG PO TABS: 50.0000 mg | ORAL_TABLET | Freq: Four times a day (QID) | ORAL | Status: DC | PRN

## 2014-06-16 MED ORDER — TRAMADOL-ACETAMINOPHEN 37.5-325 MG PO TABS: 1.0000 | ORAL_TABLET | Freq: Three times a day (TID) | ORAL | Status: DC | PRN

## 2014-06-16 MED ORDER — TRAMADOL HCL 50 MG PO TABS
50.0000 mg | ORAL_TABLET | Freq: Once | ORAL | Status: AC
  Administered 2014-06-16: 50 mg via ORAL
  Filled 2014-06-16: qty 1

## 2014-06-16 NOTE — ED Notes (Signed)
Pt is attempting to call one of her friends to pick her up.

## 2014-06-16 NOTE — ED Notes (Signed)
Per EMS pt coming from home with c/o chronic abdominal pain x 1 year, takes tramadol for pain and reports to EMS that she will "run out of pain medicine over the weekend". Pt sts she wants [place] to find out what is wrong with her abdomen (she thinks it's cancer).

## 2014-06-16 NOTE — ED Notes (Signed)
Pt sts she has no one to come to pick her up, sts her son is at conference out of state. Pt has list with phone numbers for her preacher and different neighbors that she wanted me to call, however I was not able to reach anyone on the list.

## 2014-06-16 NOTE — ED Notes (Signed)
Pt sts hse was seen at her PCP yesterday where she requested refill for her tramadol but nurse told her she can't give have it because it was not due yet. So this morning she called her PCP again and they told her if she needs refill on her meds to call 911 and come to ED for it.

## 2014-06-16 NOTE — ED Provider Notes (Signed)
TIME SEEN: 10:16 AM  CHIEF COMPLAINT: Abdominal pain  HPI: Patient is a 80 y.o. F with history of hypertension, hyperlipidemia, prior CVA, chronic abdominal pain who presents emergency department with abdominal pain for the past 2 years. She describes as diffuse, sharp. She will have intermittent constipation and diarrhea. Reports yesterday she did have several episodes of nonbloody diarrhea. She denies any fevers, chills, nausea, vomiting, dysuria or hematuria, vaginal bleeding or discharge. She is status post hysterectomy, appendectomy and cholecystectomy. She has had an EGD and colonoscopy.  She also had a pelvic ultrasound that was unremarkable on 10/13/2013. She's also had a CT of her abdomen and pelvis on 06/07/14 that was negative.  ROS: See HPI Constitutional: no fever  Eyes: no drainage  ENT: no runny nose   Cardiovascular:  no chest pain  Resp: no SOB  GI: no vomiting GU: no dysuria Integumentary: no rash  Allergy: no hives  Musculoskeletal: no leg swelling  Neurological: no slurred speech ROS otherwise negative  PAST MEDICAL HISTORY/PAST SURGICAL HISTORY:  Past Medical History  Diagnosis Date  . Depression   . Hypertension   . High cholesterol   . Rectal prolapse   . Hx of adenomatous colonic polyps   . Internal hemorrhoids   . Hypothyroidism   . Fecal incontinence   . TIA (transient ischemic attack)   . Stroke , sept 1, 2010sept 12, 2011    x 2  . History of frequent urinary tract infections     recent  . Migraine   . Fatty liver 03/20/13    MEDICATIONS:  Prior to Admission medications   Medication Sig Start Date End Date Taking? Authorizing Provider  amLODipine (NORVASC) 2.5 MG tablet Take 2.5 mg by mouth daily.     Historical Provider, MD  aspirin EC 81 MG tablet Take 81 mg by mouth daily.    Historical Provider, MD  Calcium Carbonate-Vitamin D (CALCIUM + D PO) Take 1 tablet by mouth daily.    Historical Provider, MD  clonazePAM (KLONOPIN) 1 MG tablet Take  0.5-1 mg by mouth at bedtime as needed for anxiety.  01/23/12   Lafayette Dragon, MD  diazepam (VALIUM) 2 MG tablet Take 2 mg by mouth every 6 (six) hours as needed. For anxiety    Historical Provider, MD  Estradiol 10 MCG TABS vaginal tablet Insert 1 tablet into vagina daily x 2 weeks. Then, insert 1 tablet into vagina twice weekly thereafter. 08/09/13   Lafayette Dragon, MD  fluconazole (DIFLUCAN) 150 MG tablet Take 150 mg by mouth daily. 1 tablet the repeat in 3 days    Historical Provider, MD  fluticasone (FLONASE) 50 MCG/ACT nasal spray Place 2 sprays into the nose daily.    Historical Provider, MD  glycopyrrolate ([name]) 2 MG tablet TAKE ONE TABLET BY MOUTH TWICE DAILY 03/24/14   Lafayette Dragon, MD  losartan (COZAAR) 50 MG tablet Take 1 tablet (50 mg total) by mouth every morning. 05/22/13   Belkys A Regalado, MD  pantoprazole (PROTONIX) 40 MG tablet Take 1 tablet (40 mg total) by mouth every morning. 04/18/13   Rebecca Eaton, MD  PRESCRIPTION MEDICATION Pt gets allergy shots once weekly. Dr Donneta Romberg.    Historical Provider, MD  propranolol (INDERAL) 40 MG tablet Take 40 mg by mouth 3 (three) times daily.    Historical Provider, MD  simvastatin (ZOCOR) 40 MG tablet Take 40 mg by mouth every morning.     Historical Provider, MD  traMADol-acetaminophen Caroline Sauger) 37.5-325  MG per tablet Take 1 tablet by mouth every 4 (four) hours as needed for pain.  04/04/13   Historical Provider, MD  vitamin C (ASCORBIC ACID) 500 MG tablet Take 500 mg by mouth daily.    Historical Provider, MD  vitamin E 200 UNIT capsule Take 200 Units by mouth daily.     Historical Provider, MD  zolpidem (AMBIEN CR) 12.5 MG CR tablet Take 12.5 mg by mouth at bedtime.    Historical Provider, MD    ALLERGIES:  Allergies  Allergen Reactions  . Augmentin [Amoxicillin-Pot Clavulanate] Swelling    Throat   . Citalopram Swelling    Throat and eyes   . Codeine Anaphylaxis  . Fish-Derived Products Anaphylaxis  . Ibuprofen Swelling     Throat and eyes   . Latex Swelling and Rash    Swelling to troat  . Lipitor [Atorvastatin] Swelling    throat  . Septra [Bactrim] Anaphylaxis  . Shellfish Allergy Anaphylaxis  . Keflet [Cephalexin] Diarrhea    Upset stomach  . Vicodin [Hydrocodone-Acetaminophen] Other (See Comments)    stroke  . Ciprofloxacin Nausea And Vomiting  . Depakene [Valproate Sodium] Hives    All over body   . Isoptin Sr [Verapamil Hcl Er] Cough  . Lisinopril Cough  . Miconazole Rash    SOCIAL HISTORY:  History  Substance Use Topics  . Smoking status: Never Smoker   . Smokeless tobacco: Never Used  . Alcohol Use: No    FAMILY HISTORY: Family History  Problem Relation Age of Onset  . Stroke Father   . Migraines Father   . CVA Father   . Heart attack Father   . Hypertension Maternal Aunt     x3  . Colon cancer Neg Hx   . CVA Mother     EXAM: BP 131/55  Pulse 64  Temp(Src) 97.6 F (36.4 C)  Resp 18  SpO2 100% CONSTITUTIONAL: Alert and oriented and responds appropriately to questions. Well-appearing; well-nourished, elderly, well-appearing, nontoxic HEAD: Normocephalic EYES: Conjunctivae clear, PERRL ENT: normal nose; no rhinorrhea; moist mucous membranes; pharynx without lesions noted NECK: Supple, no meningismus, no LAD  CARD: RRR; S1 and S2 appreciated; no murmurs, no clicks, no rubs, no gallops RESP: Normal chest excursion without splinting or tachypnea; breath sounds clear and equal bilaterally; no wheezes, no rhonchi, no rales,  ABD/GI: Hyperactive bowel sounds, abdomen is soft and mildly tender to palpation in the right upper quadrant and left lower quadrant, nondistended, no rebound or guarding, no peritoneal signs BACK:  The back appears normal and is non-tender to palpation, there is no CVA tenderness EXT: Normal ROM in all joints; non-tender to palpation; no edema; normal capillary refill; no cyanosis    SKIN: Normal color for age and race; warm NEURO: Moves all  extremities equally PSYCH: The patient's mood and manner are appropriate. Grooming and personal hygiene are appropriate.  MEDICAL DECISION MAKING: Patient here with complaints of abdominal pain has been present for the past 2 years. She has had EGD, colonoscopy, pelvic ultrasound, CT scan. She reports that she ran out of her tramadol for pain and she did not think that she would "make it through the weekend" without this medication. She states she tried calling her primary care doctor and was instructed to come to the ER. Her abdominal exam is relatively benign. Given her symptoms have been chronic and she is having recent negative CT scan, I do not feel a CT be repeated today. We'll obtain labs, urine and  acute abdominal series. We'll give Tylenol and reassess.  ED PROGRESS: Patient's workup today has been unremarkable. Given this is chronic, I feel she is safe to be discharged home. I feel she needs further intervention today. No vomiting in the ED. Tolerating by mouth. We'll discharge with prescription for tramadol but have instructed her she needs to followup with her primary care in gastroenterologist for this. Discussed return precautions. She verbalized understanding and is comfortable with plan.     [place], DO 06/16/14 1406

## 2014-06-16 NOTE — Discharge Instructions (Signed)
Abdominal Pain, Women °Abdominal (stomach, pelvic, or belly) pain can be caused by many things. It is important to tell your doctor: °· The location of the pain. °· Does it come and go or is it present all the time? °· Are there things that start the pain (eating certain foods, exercise)? °· Are there other symptoms associated with the pain (fever, nausea, vomiting, diarrhea)? °All of this is helpful to know when trying to find the cause of the pain. °CAUSES  °· Stomach: virus or bacteria infection, or ulcer. °· Intestine: appendicitis (inflamed appendix), regional ileitis (Crohn's disease), ulcerative colitis (inflamed colon), irritable bowel syndrome, diverticulitis (inflamed diverticulum of the colon), or cancer of the stomach or intestine. °· Gallbladder disease or stones in the gallbladder. °· Kidney disease, kidney stones, or infection. °· Pancreas infection or cancer. °· Fibromyalgia (pain disorder). °· Diseases of the female organs: °· Uterus: fibroid (non-cancerous) tumors or infection. °· Fallopian tubes: infection or tubal pregnancy. °· Ovary: cysts or tumors. °· Pelvic adhesions (scar tissue). °· Endometriosis (uterus lining tissue growing in the pelvis and on the pelvic organs). °· Pelvic congestion syndrome (female organs filling up with blood just before the menstrual period). °· Pain with the menstrual period. °· Pain with ovulation (producing an egg). °· Pain with an IUD (intrauterine device, birth control) in the uterus. °· Cancer of the female organs. °· Functional pain (pain not caused by a disease, may improve without treatment). °· Psychological pain. °· Depression. °DIAGNOSIS  °Your doctor will decide the seriousness of your pain by doing an examination. °· Blood tests. °· X-rays. °· Ultrasound. °· CT scan (computed tomography, special type of X-ray). °· MRI (magnetic resonance imaging). °· Cultures, for infection. °· Barium enema (dye inserted in the large intestine, to better view it with  X-rays). °· Colonoscopy (looking in intestine with a lighted tube). °· Laparoscopy (minor surgery, looking in abdomen with a lighted tube). °· Major abdominal exploratory surgery (looking in abdomen with a large incision). °TREATMENT  °The treatment will depend on the cause of the pain.  °· Many cases can be observed and treated at home. °· Over-the-counter medicines recommended by your caregiver. °· Prescription medicine. °· Antibiotics, for infection. °· Birth control pills, for painful periods or for ovulation pain. °· Hormone treatment, for endometriosis. °· Nerve blocking injections. °· Physical therapy. °· Antidepressants. °· Counseling with a psychologist or psychiatrist. °· Minor or major surgery. °HOME CARE INSTRUCTIONS  °· Do not take laxatives, unless directed by your caregiver. °· Take over-the-counter pain medicine only if ordered by your caregiver. Do not take aspirin because it can cause an upset stomach or bleeding. °· Try a clear liquid diet (broth or water) as ordered by your caregiver. Slowly move to a bland diet, as tolerated, if the pain is related to the stomach or intestine. °· Have a thermometer and take your temperature several times a day, and record it. °· Bed rest and sleep, if it helps the pain. °· Avoid sexual intercourse, if it causes pain. °· Avoid stressful situations. °· Keep your follow-up appointments and tests, as your caregiver orders. °· If the pain does not go away with medicine or surgery, you may try: °· Acupuncture. °· Relaxation exercises (yoga, meditation). °· Group therapy. °· Counseling. °SEEK MEDICAL CARE IF:  °· You notice certain foods cause stomach pain. °· Your home care treatment is not helping your pain. °· You need stronger pain medicine. °· You want your IUD removed. °· You feel faint or   lightheaded. °· You develop nausea and vomiting. °· You develop a rash. °· You are having side effects or an allergy to your medicine. °SEEK IMMEDIATE MEDICAL CARE IF:  °· Your  pain does not go away or gets worse. °· You have a fever. °· Your pain is felt only in portions of the abdomen. The right side could possibly be appendicitis. The left lower portion of the abdomen could be colitis or diverticulitis. °· You are passing blood in your stools (bright red or black tarry stools, with or without vomiting). °· You have blood in your urine. °· You develop chills, with or without a fever. °· You pass out. °MAKE SURE YOU:  °· Understand these instructions. °· Will watch your condition. °· Will get help right away if you are not doing well or get worse. °Document Released: 06/15/2007 Document Revised: 01/02/2014 Document Reviewed: 07/05/2009 °[employer]® Patient Information ©2015 [employer]. This information is not intended to replace advice given to you by your health care provider. Make sure you discuss any questions you have with your health care provider. °Chronic Pain °Chronic pain can be defined as pain that is off and on and lasts for 3-6 months or longer. Many things cause chronic pain, which can make it difficult to make a diagnosis. There are many treatment options available for chronic pain. However, finding a treatment that works well for you may require trying various approaches until the right one is found. Many people benefit from a combination of two or more types of treatment to control their pain. °SYMPTOMS  °Chronic pain can occur anywhere in the body and can range from mild to very severe. Some types of chronic pain include: °· Headache. °· Low back pain. °· Cancer pain. °· Arthritis pain. °· Neurogenic pain. This is pain resulting from damage to nerves. ° People with chronic pain may also have other symptoms such as: °· Depression. °· Anger. °· Insomnia. °· Anxiety. °DIAGNOSIS  °Your health care provider will help diagnose your condition over time. In many cases, the initial focus will be on excluding possible conditions that could be causing the pain. Depending on your  symptoms, your health care provider may order tests to diagnose your condition. Some of these tests may include:  °· Blood tests.   °· CT scan.   °· MRI.   °· X-rays.   °· Ultrasounds.   °· Nerve conduction studies.   °You may need to see a specialist.  °TREATMENT  °Finding treatment that works well may take time. You may be referred to a pain specialist. He or she may prescribe medicine or therapies, such as:  °· Mindful meditation or yoga. °· Shots (injections) of numbing or pain-relieving medicines into the spine or area of pain. °· Local electrical stimulation. °· Acupuncture.   °· Massage therapy.   °· Aroma, color, light, or sound therapy.   °· Biofeedback.   °· Working with a physical therapist to keep from getting stiff.   °· Regular, gentle exercise.   °· Cognitive or behavioral therapy.   °· Group support.   °Sometimes, surgery may be recommended.  °HOME CARE INSTRUCTIONS  °· Take all medicines as directed by your health care provider.   °· Lessen stress in your life by relaxing and doing things such as listening to calming music.   °· Exercise or be active as directed by your health care provider.   °· Eat a healthy diet and include things such as vegetables, fruits, fish, and lean meats in your diet.   °· Keep all follow-up appointments with your health care provider.   °·   Attend a support group with others suffering from chronic pain. °SEEK MEDICAL CARE IF:  °· Your pain gets worse.   °· You develop a new pain that was not there before.   °· You cannot tolerate medicines given to you by your health care provider.   °· You have new symptoms since your last visit with your health care provider.   °SEEK IMMEDIATE MEDICAL CARE IF:  °· You feel weak.   °· You have decreased sensation or numbness.   °· You lose control of bowel or bladder function.   °· Your pain suddenly gets much worse.   °· You develop shaking. °· You develop chills. °· You develop confusion. °· You develop chest pain. °· You develop  shortness of breath.   °MAKE SURE YOU: °· Understand these instructions. °· Will watch your condition. °· Will get help right away if you are not doing well or get worse. °Document Released: 05/10/2002 Document Revised: 04/20/2013 Document Reviewed: 02/11/2013 °[employer]® Patient Information ©2015 [employer]. This information is not intended to replace advice given to you by your health care provider. Make sure you discuss any questions you have with your health care provider. ° °

## 2014-06-16 NOTE — ED Notes (Signed)
Bed: WA17 Expected date:  Expected time:  Means of arrival:  Comments: EMS-med refill

## 2014-09-05 ENCOUNTER — Other Ambulatory Visit: Payer: Self-pay | Admitting: Internal Medicine

## 2015-03-06 ENCOUNTER — Other Ambulatory Visit: Payer: Self-pay | Admitting: Internal Medicine

## 2015-07-31 ENCOUNTER — Encounter (HOSPITAL_COMMUNITY): Payer: Self-pay | Admitting: Emergency Medicine

## 2015-07-31 ENCOUNTER — Emergency Department (HOSPITAL_COMMUNITY)
Admission: EM | Admit: 2015-07-31 | Discharge: 2015-07-31 | Disposition: A | Discharge status: Home / Self Care | Payer: Medicare Other | Attending: Emergency Medicine | Admitting: Emergency Medicine

## 2015-07-31 DIAGNOSIS — Z86010 Personal history of colonic polyps: Secondary | ICD-10-CM | POA: Diagnosis not present

## 2015-07-31 DIAGNOSIS — G43909 Migraine, unspecified, not intractable, without status migrainosus: Secondary | ICD-10-CM | POA: Diagnosis not present

## 2015-07-31 DIAGNOSIS — R51 Headache: Secondary | ICD-10-CM

## 2015-07-31 DIAGNOSIS — R519 Headache, unspecified: Secondary | ICD-10-CM

## 2015-07-31 DIAGNOSIS — Z7982 Long term (current) use of aspirin: Secondary | ICD-10-CM | POA: Diagnosis not present

## 2015-07-31 DIAGNOSIS — Z91040 Latex allergy status: Secondary | ICD-10-CM | POA: Insufficient documentation

## 2015-07-31 DIAGNOSIS — Z8719 Personal history of other diseases of the digestive system: Secondary | ICD-10-CM | POA: Diagnosis not present

## 2015-07-31 DIAGNOSIS — J019 Acute sinusitis, unspecified: Secondary | ICD-10-CM | POA: Diagnosis not present

## 2015-07-31 DIAGNOSIS — Z87440 Personal history of urinary (tract) infections: Secondary | ICD-10-CM | POA: Diagnosis not present

## 2015-07-31 DIAGNOSIS — Z8673 Personal history of transient ischemic attack (TIA), and cerebral infarction without residual deficits: Secondary | ICD-10-CM | POA: Insufficient documentation

## 2015-07-31 DIAGNOSIS — E7800 Pure hypercholesterolemia, unspecified: Secondary | ICD-10-CM | POA: Diagnosis not present

## 2015-07-31 DIAGNOSIS — I10 Essential (primary) hypertension: Secondary | ICD-10-CM | POA: Diagnosis present

## 2015-07-31 DIAGNOSIS — Z79899 Other long term (current) drug therapy: Secondary | ICD-10-CM | POA: Insufficient documentation

## 2015-07-31 MED ORDER — AMLODIPINE BESYLATE 2.5 MG PO TABS
2.5000 mg | ORAL_TABLET | Freq: Two times a day (BID) | ORAL | Status: DC
  Administered 2015-07-31: 2.5 mg via ORAL
  Filled 2015-07-31: qty 1

## 2015-07-31 MED ORDER — DOXYCYCLINE HYCLATE 100 MG PO CAPS: 100.0000 mg | ORAL_CAPSULE | Freq: Two times a day (BID) | ORAL | Status: DC

## 2015-07-31 MED ORDER — LOSARTAN POTASSIUM 50 MG PO TABS
100.0000 mg | ORAL_TABLET | Freq: Every day | ORAL | Status: DC
  Administered 2015-07-31: 100 mg via ORAL
  Filled 2015-07-31: qty 2

## 2015-07-31 NOTE — ED Notes (Signed)
Pt here via EMS from home. Pt was seen at urgent care a few days ago and was told to "seek immediate medical attention if her bp goes above [id number] systolic" Pt is anxious upon assessment. Per EMS pt had complaints of bruises from previous falls, but EMS did not see any.

## 2015-07-31 NOTE — ED Notes (Signed)
Bed: [facility] Expected date:  Expected time:  Means of arrival:  Comments: EMS- 81yo F, headache/elevated BP/back pain

## 2015-07-31 NOTE — ED Provider Notes (Signed)
CSN: [id number]     Arrival date & time 07/31/15  1919 History   First MD Initiated Contact with Patient 07/31/15 1940     Chief Complaint  Patient presents with  . Hypertension    160/90 in route     (Consider location/radiation/quality/duration/timing/severity/associated sxs/prior Treatment) HPI   Kathryn [name] is a 81 y.o. female, with a history of hypertension, TIA, and CVA, presenting to the ED with high blood pressure accompanied by headache the last week. Patient states that she was told to keep her blood pressure below [id number] systolic and if it goes above this to contact her doctor. Patient states that she takes her blood pressure at home and it's been high for the last week, but she just now today spoke with her doctor about her blood pressure and her doctor recommended that she come to the ED. Patient states that she has a blockage in a vessel in her brain and this is why she can't have her blood pressure go above [id number] systolic. Patient states that she is very worried about her blood pressure. Patient confirms that she has taken all of her medicines today, to include her blood pressure medicines. Patient describes her headache as "a migraine," that started a week ago, bilateral, throbbing, nonradiating, rated 6 out of 10. Patient denies fever/chills, nausea/vomiting, chest pain, shortness of breath, visual disturbances, or any other pain or complaints. Patient is also complaining of some sinus congestion and sinus pain for the last 10 days.     Past Medical History  Diagnosis Date  . Depression   . Hypertension   . High cholesterol   . Rectal prolapse   . Hx of adenomatous colonic polyps   . Internal hemorrhoids   . Hypothyroidism   . Fecal incontinence   . TIA (transient ischemic attack)   . Stroke ([place]) , sept 1, 2010sept 12, 2011    x 2  . History of frequent urinary tract infections     recent  . Migraine   . Fatty liver 03/20/13   Past Surgical History  Procedure  Laterality Date  . Colon surgery  2010, for prolapsed organs after hystecrtomy surgery  . Appendectomy  2008  . Abdominal hysterectomy  2010  . Cholecystectomy    . Lumbar laminectomy/decompression microdiscectomy N/A 01/07/2013    Procedure: LUMBAR LAMINECTOMY CENTRAL DECOMPRESSION L4-L5, BILATERAL FORAMENOTOMY L4,L5    ;  Surgeon: Tobi Bastos, MD;  Location: [facility];  Service: Orthopedics;  Laterality: N/A;   Family History  Problem Relation Age of Onset  . Stroke Father   . Migraines Father   . CVA Father   . Heart attack Father   . Hypertension Maternal Aunt     x3  . Colon cancer Neg Hx   . CVA Mother    Social History  Substance Use Topics  . Smoking status: Never Smoker   . Smokeless tobacco: Never Used  . Alcohol Use: No   OB History    No data available     Review of Systems  Constitutional:       Hypertension  Neurological: Positive for headaches.  All other systems reviewed and are negative.     Allergies  Augmentin; Citalopram; Codeine; Fish-derived products; Hyoscyamine; Ibuprofen; Latex; Lipitor; Septra; Shellfish allergy; Keflet; Meloxicam; Vicodin; Zoloft; Ciprofloxacin; Depakene; Isoptin sr; Lisinopril; and Miconazole  Home Medications   Prior to Admission medications   Medication Sig Start Date End Date Taking? Authorizing Provider  amLODipine (NORVASC) 2.5 MG  tablet Take 2.5 mg by mouth 2 (two) times daily.     Historical Provider, MD  aspirin EC 81 MG tablet Take 81 mg by mouth daily.    Historical Provider, MD  Calcium Carbonate-Vitamin D (CALCIUM + D PO) Take 1 tablet by mouth daily.    Historical Provider, MD  clonazePAM (KLONOPIN) 1 MG tablet Take 0.5-1 mg by mouth at bedtime as needed for anxiety.  01/23/12   Lafayette Dragon, MD  diazepam (VALIUM) 2 MG tablet Take 2 mg by mouth every 6 (six) hours as needed for anxiety.     Historical Provider, MD  doxycycline (VIBRAMYCIN) 100 MG capsule Take 1 capsule (100 mg total) by mouth 2 (two) times  daily. 07/31/15   Bettie Capistran C Javonne Dorko, PA-C  estradiol (VIVELLE-DOT) 0.0375 MG/24HR Place 0.5 patches onto the skin 2 (two) times a week. Sunday and Wed    Historical Provider, MD  fluconazole (DIFLUCAN) 150 MG tablet Take 150 mg by mouth once as needed (yeast infection.).    Historical Provider, MD  fluticasone (FLONASE) 50 MCG/ACT nasal spray Place 2 sprays into the nose daily as needed for allergies.     Historical Provider, MD  glycopyrrolate ([name]) 2 MG tablet Take 2 mg by mouth. Every five hours.    Historical Provider, MD  glycopyrrolate ([name]) 2 MG tablet TAKE ONE TABLET BY MOUTH TWICE DAILY 03/27/15   Lafayette Dragon, MD  losartan (COZAAR) 100 MG tablet Take 100 mg by mouth daily.    Historical Provider, MD  nystatin-triamcinolone (MYCOLOG II) cream Apply 1 application topically 2 (two) times daily.    Historical Provider, MD  pantoprazole (PROTONIX) 40 MG tablet Take 1 tablet (40 mg total) by mouth every morning. 04/18/13   Terance Ice, MD  PRESCRIPTION MEDICATION Pt gets allergy shots once weekly. Dr Donneta Romberg.    Historical Provider, MD  Probiotic Product (PROBIOTIC DAILY PO) Take 1 tablet by mouth daily.    Historical Provider, MD  propranolol (INDERAL) 40 MG tablet Take 40 mg by mouth 3 (three) times daily.    Historical Provider, MD  simvastatin (ZOCOR) 40 MG tablet Take 40 mg by mouth every morning.     Historical Provider, MD  traMADol-acetaminophen (ULTRACET) 37.5-325 MG per tablet Take 1 tablet by mouth every 4 (four) hours as needed for pain.  04/04/13   Historical Provider, MD  traMADol-acetaminophen (ULTRACET) 37.5-325 MG per tablet Take 1 tablet by mouth every 8 (eight) hours as needed. 06/16/14   Kristen N Ward, DO  vitamin B-12 (CYANOCOBALAMIN) 100 MCG tablet Take 100 mcg by mouth daily.    Historical Provider, MD  vitamin C (ASCORBIC ACID) 500 MG tablet Take 500 mg by mouth daily.    Historical Provider, MD  vitamin E 200 UNIT capsule Take 200 Units by mouth daily.      Historical Provider, MD  zolpidem (AMBIEN CR) 12.5 MG CR tablet Take 12.5 mg by mouth at bedtime.    Historical Provider, MD   BP 157/74 mmHg  Pulse 79  Temp(Src) 98.1 F (36.7 C) (Oral)  Resp 18  SpO2 94% Physical Exam  Constitutional: She appears well-developed and well-nourished. No distress.  HENT:  Head: Normocephalic and atraumatic.  Eyes: Conjunctivae and EOM are normal. Pupils are equal, round, and reactive to light.  Cardiovascular: Normal rate, regular rhythm, normal heart sounds and intact distal pulses.   Pulmonary/Chest: Effort normal and breath sounds normal. No respiratory distress.  Abdominal: Soft. Bowel sounds are normal.  Musculoskeletal: Normal  range of motion. She exhibits no edema or tenderness.  Neurological: She is alert. She has normal reflexes.  No sensory deficits. Strength 5/5 in all extremities. Cranial nerves II-XII grossly intact.  Skin: Skin is warm and dry. She is not diaphoretic.  Nursing note and vitals reviewed.   ED Course  Procedures (including critical care time) Labs Review Labs Reviewed - No data to display  Imaging Review No results found. I have personally reviewed and evaluated these images and lab results as part of my medical decision-making.   EKG Interpretation None      MDM   Final diagnoses:  Essential hypertension  Nonintractable episodic headache, unspecified headache type  Acute sinusitis, recurrence not specified, unspecified location    Odile A Goza presents with high blood pressure and headache for one week.  Findings and plan of care discussed with Merrily Pew, MD.   Patient takes Norvasc and losartan for her blood pressure. Patient can take an extra dose of her blood pressure medicine today. Do not suspect an intracranial bleed. Patient is not in hypertensive emergency. Patient has no focal neurologic deficits. Patient has had sinus pain and congestion for long enough to warrant antibiotics. Patient will be  instructed to take an extra dose of her blood pressure medication this evening and follow-up with her PCP tomorrow.  Filed Vitals:   07/31/15 1925 07/31/15 1943 07/31/15 2120 07/31/15 2207  BP:  168/77 160/71 157/74  Pulse:  74  79  Temp:  98.1 F (36.7 C)    TempSrc:  Oral    Resp:  18  18  SpO2: 97% 97%  94%     Lorayne Bender, PA-C 07/31/15 2223  Merrily Pew, MD 08/01/15 [phone]

## 2015-07-31 NOTE — Discharge Instructions (Signed)
You have been seen today for hypertension and sinus infection. You have been given an extra dose of your hypertension meds here in the ED. You're been given an antibiotic for your sinus infection because it is been going on long enough to warrant this treatment. Follow up with PCP as soon as possible on your blood pressure. Return to ED should symptoms worsen.

## 2015-10-05 ENCOUNTER — Emergency Department (HOSPITAL_COMMUNITY)
Admission: EM | Admit: 2015-10-05 | Discharge: 2015-10-05 | Disposition: A | Discharge status: Home / Self Care | Payer: Medicare Other | Attending: Physician Assistant | Admitting: Physician Assistant

## 2015-10-05 ENCOUNTER — Encounter (HOSPITAL_COMMUNITY): Payer: Self-pay | Admitting: Emergency Medicine

## 2015-10-05 DIAGNOSIS — Z8673 Personal history of transient ischemic attack (TIA), and cerebral infarction without residual deficits: Secondary | ICD-10-CM | POA: Insufficient documentation

## 2015-10-05 DIAGNOSIS — F329 Major depressive disorder, single episode, unspecified: Secondary | ICD-10-CM | POA: Insufficient documentation

## 2015-10-05 DIAGNOSIS — Z90710 Acquired absence of both cervix and uterus: Secondary | ICD-10-CM | POA: Diagnosis not present

## 2015-10-05 DIAGNOSIS — Z8719 Personal history of other diseases of the digestive system: Secondary | ICD-10-CM | POA: Insufficient documentation

## 2015-10-05 DIAGNOSIS — Z79899 Other long term (current) drug therapy: Secondary | ICD-10-CM | POA: Insufficient documentation

## 2015-10-05 DIAGNOSIS — F0390 Unspecified dementia without behavioral disturbance: Secondary | ICD-10-CM | POA: Diagnosis not present

## 2015-10-05 DIAGNOSIS — Z7982 Long term (current) use of aspirin: Secondary | ICD-10-CM | POA: Insufficient documentation

## 2015-10-05 DIAGNOSIS — Z7952 Long term (current) use of systemic steroids: Secondary | ICD-10-CM | POA: Insufficient documentation

## 2015-10-05 DIAGNOSIS — R300 Dysuria: Secondary | ICD-10-CM | POA: Insufficient documentation

## 2015-10-05 DIAGNOSIS — Z792 Long term (current) use of antibiotics: Secondary | ICD-10-CM | POA: Insufficient documentation

## 2015-10-05 DIAGNOSIS — R109 Unspecified abdominal pain: Secondary | ICD-10-CM | POA: Diagnosis present

## 2015-10-05 DIAGNOSIS — Z91040 Latex allergy status: Secondary | ICD-10-CM | POA: Diagnosis not present

## 2015-10-05 DIAGNOSIS — Z9049 Acquired absence of other specified parts of digestive tract: Secondary | ICD-10-CM | POA: Diagnosis not present

## 2015-10-05 DIAGNOSIS — R1084 Generalized abdominal pain: Secondary | ICD-10-CM | POA: Diagnosis not present

## 2015-10-05 DIAGNOSIS — G43909 Migraine, unspecified, not intractable, without status migrainosus: Secondary | ICD-10-CM | POA: Insufficient documentation

## 2015-10-05 DIAGNOSIS — E785 Hyperlipidemia, unspecified: Secondary | ICD-10-CM | POA: Diagnosis not present

## 2015-10-05 DIAGNOSIS — Z87440 Personal history of urinary (tract) infections: Secondary | ICD-10-CM | POA: Insufficient documentation

## 2015-10-05 DIAGNOSIS — Z86010 Personal history of colonic polyps: Secondary | ICD-10-CM | POA: Diagnosis not present

## 2015-10-05 DIAGNOSIS — I10 Essential (primary) hypertension: Secondary | ICD-10-CM | POA: Insufficient documentation

## 2015-10-05 LAB — COMPREHENSIVE METABOLIC PANEL
ALBUMIN: 4.3 g/dL (ref 3.5–5.0)
ALK PHOS: 51 U/L (ref 38–126)
ALT: 12 U/L — ABNORMAL LOW (ref 14–54)
ANION GAP: 10 (ref 5–15)
AST: 25 U/L (ref 15–41)
BILIRUBIN TOTAL: 0.9 mg/dL (ref 0.3–1.2)
BUN: 9 mg/dL (ref 6–20)
CALCIUM: 9.8 mg/dL (ref 8.9–10.3)
CO2: 27 mmol/L (ref 22–32)
Chloride: 96 mmol/L — ABNORMAL LOW (ref 101–111)
Creatinine, Ser: 0.61 mg/dL (ref 0.44–1.00)
GFR calc Af Amer: >60 mL/min (ref >60)
GLUCOSE: 92 mg/dL (ref 65–99)
Potassium: 3.7 mmol/L (ref 3.5–5.1)
Sodium: 133 mmol/L — ABNORMAL LOW (ref 135–145)
TOTAL PROTEIN: 7.5 g/dL (ref 6.5–8.1)

## 2015-10-05 LAB — URINALYSIS, ROUTINE W REFLEX MICROSCOPIC
BILIRUBIN URINE: NEGATIVE
Glucose, UA: NEGATIVE mg/dL
Hgb urine dipstick: NEGATIVE
KETONES UR: NEGATIVE mg/dL
Leukocytes, UA: NEGATIVE
NITRITE: NEGATIVE
PH: 7.5 (ref 5.0–8.0)
Protein, ur: NEGATIVE mg/dL
SPECIFIC GRAVITY, URINE: 1.004 — AB (ref 1.005–1.030)

## 2015-10-05 LAB — CBC WITH DIFFERENTIAL/PLATELET
BASOS ABS: 0 10*3/uL (ref 0.0–0.1)
BASOS PCT: 1 %
EOS ABS: 0.4 10*3/uL (ref 0.0–0.7)
Eosinophils Relative: 11 %
HCT: 36.4 % (ref 36.0–46.0)
Hemoglobin: 12.2 g/dL (ref 12.0–15.0)
Lymphocytes Relative: 34 %
Lymphs Abs: 1.3 10*3/uL (ref 0.7–4.0)
MCH: 30.6 pg (ref 26.0–34.0)
MCHC: 33.5 g/dL (ref 30.0–36.0)
MCV: 91.2 fL (ref 78.0–100.0)
MONO ABS: 0.5 10*3/uL (ref 0.1–1.0)
Monocytes Relative: 12 %
Neutro Abs: 1.6 10*3/uL — ABNORMAL LOW (ref 1.7–7.7)
Neutrophils Relative %: 42 %
PLATELETS: 218 10*3/uL (ref 150–400)
RBC: 3.99 MIL/uL (ref 3.87–5.11)
RDW: 12.4 % (ref 11.5–15.5)
WBC: 3.7 10*3/uL — ABNORMAL LOW (ref 4.0–10.5)

## 2015-10-05 LAB — LIPASE, BLOOD: LIPASE: 25 U/L (ref 11–51)

## 2015-10-05 NOTE — ED Notes (Signed)
Pt reports continued lower abdominal pain and dysuria post completion of antibiotics for UTI.

## 2015-10-05 NOTE — ED Provider Notes (Signed)
CSN: [id number]     Arrival date & time 10/05/15  1303 History   First MD Initiated Contact with Patient 10/05/15 1328     Chief Complaint  Patient presents with  . Dysuria     (Consider location/radiation/quality/duration/timing/severity/associated sxs/prior Treatment) HPI Comments: 82 year old female with past medical history including CVA, hypertension, hyperlipidemia, depression, memory loss who presents with dysuria. History limited because of the patient's memory loss and no family present. According to EMS, the patient called them to her house for abdominal pain with urination. She told them that she was diagnosed with a bladder infection last week and recently finished antibiotics but is still having dysuria. During my examination, the patient couldn't recall how she had gotten here and does not know the antibiotics she was on. She denies any vomiting or fevers.  LEVEL 5 CAVEAT APPLIES 2/2 DEMENTIA  Patient is a 82 y.o. female presenting with dysuria. The history is provided by the patient.  Dysuria   Past Medical History  Diagnosis Date  . Depression   . Hypertension   . High cholesterol   . Rectal prolapse   . Hx of adenomatous colonic polyps   . Internal hemorrhoids   . Hypothyroidism   . Fecal incontinence   . TIA (transient ischemic attack)   . Stroke ([place]) , sept 1, 2010sept 12, 2011    x 2  . History of frequent urinary tract infections     recent  . Migraine   . Fatty liver 03/20/13   Past Surgical History  Procedure Laterality Date  . Colon surgery  2010, for prolapsed organs after hystecrtomy surgery  . Appendectomy  2008  . Abdominal hysterectomy  2010  . Cholecystectomy    . Lumbar laminectomy/decompression microdiscectomy N/A 01/07/2013    Procedure: LUMBAR LAMINECTOMY CENTRAL DECOMPRESSION L4-L5, BILATERAL FORAMENOTOMY L4,L5    ;  Surgeon: Tobi Bastos, MD;  Location: [facility];  Service: Orthopedics;  Laterality: N/A;   Family History  Problem  Relation Age of Onset  . Stroke Father   . Migraines Father   . CVA Father   . Heart attack Father   . Hypertension Maternal Aunt     x3  . Colon cancer Neg Hx   . CVA Mother    Social History  Substance Use Topics  . Smoking status: Never Smoker   . Smokeless tobacco: Never Used  . Alcohol Use: No   OB History    No data available     Review of Systems  Unable to perform ROS: Dementia  Genitourinary: Positive for dysuria.      Allergies  Augmentin; Citalopram; Codeine; Fish-derived products; Hyoscyamine; Ibuprofen; Latex; Lipitor; Septra; Shellfish allergy; Keflet; Meloxicam; Vicodin; Zoloft; Ciprofloxacin; Depakene; Isoptin sr; Lisinopril; and Miconazole  Home Medications   Prior to Admission medications   Medication Sig Start Date End Date Taking? Authorizing Provider  amLODipine (NORVASC) 2.5 MG tablet Take 2.5 mg by mouth 2 (two) times daily.    Yes Historical Provider, MD  aspirin EC 81 MG tablet Take 81 mg by mouth daily.   Yes Historical Provider, MD  Calcium Carbonate-Vitamin D (CALCIUM + D PO) Take 1 tablet by mouth daily.   Yes Historical Provider, MD  clonazePAM (KLONOPIN) 1 MG tablet Take 0.5-1 mg by mouth at bedtime as needed for anxiety. Reported on 10/05/2015 01/23/12  Yes Lafayette Dragon, MD  losartan (COZAAR) 100 MG tablet Take 100 mg by mouth daily.   Yes Historical Provider, MD  traMADol-acetaminophen Caroline Sauger)  37.5-325 MG per tablet Take 1 tablet by mouth every 4 (four) hours as needed for pain.  04/04/13  Yes Historical Provider, MD  diazepam (VALIUM) 2 MG tablet Take 2 mg by mouth every 6 (six) hours as needed for anxiety. Reported on 10/05/2015    Historical Provider, MD  doxycycline (VIBRAMYCIN) 100 MG capsule Take 1 capsule (100 mg total) by mouth 2 (two) times daily. 07/31/15   Shawn C Joy, PA-C  estradiol (VIVELLE-DOT) 0.0375 MG/24HR Place 0.5 patches onto the skin 2 (two) times a week. Sunday and Wed    Historical Provider, MD  fluconazole (DIFLUCAN)  150 MG tablet Take 150 mg by mouth once as needed (yeast infection.). Reported on 10/05/2015    Historical Provider, MD  fluticasone (FLONASE) 50 MCG/ACT nasal spray Place 2 sprays into the nose daily as needed for allergies.     Historical Provider, MD  glycopyrrolate ([name]) 2 MG tablet Take 2 mg by mouth. Every five hours.    Historical Provider, MD  glycopyrrolate ([name]) 2 MG tablet TAKE ONE TABLET BY MOUTH TWICE DAILY 03/27/15   Lafayette Dragon, MD  nystatin-triamcinolone (MYCOLOG II) cream Apply 1 application topically 2 (two) times daily.    Historical Provider, MD  pantoprazole (PROTONIX) 40 MG tablet Take 1 tablet (40 mg total) by mouth every morning. 04/18/13   Terance Ice, MD  PRESCRIPTION MEDICATION Pt gets allergy shots once weekly. Dr Donneta Romberg.    Historical Provider, MD  Probiotic Product (PROBIOTIC DAILY PO) Take 1 tablet by mouth daily.    Historical Provider, MD  propranolol (INDERAL) 40 MG tablet Take 40 mg by mouth 3 (three) times daily.    Historical Provider, MD  simvastatin (ZOCOR) 40 MG tablet Take 40 mg by mouth every morning.     Historical Provider, MD  traMADol-acetaminophen (ULTRACET) 37.5-325 MG per tablet Take 1 tablet by mouth every 8 (eight) hours as needed. 06/16/14   Kristen N Ward, DO  vitamin B-12 (CYANOCOBALAMIN) 100 MCG tablet Take 100 mcg by mouth daily.    Historical Provider, MD  vitamin C (ASCORBIC ACID) 500 MG tablet Take 500 mg by mouth daily.    Historical Provider, MD  vitamin E 200 UNIT capsule Take 200 Units by mouth daily.     Historical Provider, MD  zolpidem (AMBIEN CR) 12.5 MG CR tablet Take 12.5 mg by mouth at bedtime.    Historical Provider, MD   BP 141/75 mmHg  Pulse 58  Temp(Src) 97.7 F (36.5 C) (Oral)  Resp 16  SpO2 100% Physical Exam  Constitutional: She appears well-developed and well-nourished. No distress.  HENT:  Head: Normocephalic and atraumatic.  Moist mucous membranes  Eyes: Conjunctivae are normal. Pupils are equal,  round, and reactive to light.  Neck: Neck supple.  Cardiovascular: Normal rate, regular rhythm and normal heart sounds.   No murmur heard. Pulmonary/Chest: Effort normal and breath sounds normal.  Abdominal: Soft. Bowel sounds are normal. She exhibits no distension. There is no rebound and no guarding.  Suprapubic TTP  Musculoskeletal: She exhibits no edema.  Neurological: She is alert.  Fluent speech, oriented to person and place but becomes confused during conversation, poor historian  Skin: Skin is warm and dry.  Psychiatric:  calm  Nursing note and vitals reviewed.   ED Course  Procedures (including critical care time) Labs Review Labs Reviewed  URINALYSIS, ROUTINE W REFLEX MICROSCOPIC (NOT AT [facility]) - Abnormal; Notable for the following:    Specific Gravity, Urine 1.004 (*)  All other components within normal limits  COMPREHENSIVE METABOLIC PANEL - Abnormal; Notable for the following:    Sodium 133 (*)    Chloride 96 (*)    ALT 12 (*)    All other components within normal limits  CBC WITH DIFFERENTIAL/PLATELET - Abnormal; Notable for the following:    WBC 3.7 (*)    Neutro Abs 1.6 (*)    All other components within normal limits  URINE CULTURE  LIPASE, BLOOD    Imaging Review No results found. I have personally reviewed and evaluated these lab results as part of my medical decision-making.   EKG Interpretation None      MDM   Final diagnoses:  Generalized abdominal pain    Pt presents by EMS for reports of ongoing abdominal pain with urination which has not improved with recent antibiotics. Patient is a poor historian due to memory loss. On exam, she is comfortable with reassuring vital signs, afebrile. Suprapubic abdominal tenderness noted, no rebound or guarding. Obtained above lab work. UA unremarkable and normal WBC count. CMP pending.  I reviewed the patient's chart which shows a note from 2015 documenting a history of chronic abdominal pain in with  extensive workup including CT scans, ultrasound, endoscopy which were unrevealing. She has been here multiple times for evaluation of abdominal pain. On reexamination with distraction, patient demonstrates no abdominal tenderness to palpation. I'm signing the patient out to the oncoming provider as her CMP is still pending. I anticipate that if labwork is unremarkable and patient has no difficulty with PO challenge, she will be able to go home.  Sharlett Iles, MD 10/05/15 [phone]

## 2015-10-05 NOTE — ED Provider Notes (Signed)
Took over care of patient from previous provider. Pt lives by herself. Complained of several different things, changed from EMS phone, to EMS personal to provider to me. Normal vitals, normal physical exam. Normal labs and urine.   Called her son Jacolyn Deheer [phone]. According to him she has fired the help that he has hired. We expressed concerns about her living alone.  He is aware.   Patient ambulating by herself, eating sandwich. Normal labs and UA.     Ciela Mahajan Julio Alm, MD 10/05/15 1816

## 2015-10-05 NOTE — ED Notes (Signed)
Patient unable to ambulate, patient stood and upon stand was very unsteady.

## 2015-10-05 NOTE — ED Notes (Signed)
Bed: [id number] Expected date: 10/05/15 Expected time: 12:55 PM Means of arrival: Ambulance Comments: Dysuria

## 2015-10-05 NOTE — Discharge Instructions (Signed)
Your labs and urine appear normal.  Please return with any fever, nausea or vomiting.   Abdominal Pain, Adult Many things can cause abdominal pain. Usually, abdominal pain is not caused by a disease and will improve without treatment. It can often be observed and treated at home. Your health care provider will do a physical exam and possibly order blood tests and X-rays to help determine the seriousness of your pain. However, in many cases, more time must pass before a clear cause of the pain can be found. Before that point, your health care provider may not know if you need more testing or further treatment. HOME CARE INSTRUCTIONS Monitor your abdominal pain for any changes. The following actions may help to alleviate any discomfort you are experiencing:  Only take over-the-counter or prescription medicines as directed by your health care provider.  Do not take laxatives unless directed to do so by your health care provider.  Try a clear liquid diet (broth, tea, or water) as directed by your health care provider. Slowly move to a bland diet as tolerated. SEEK MEDICAL CARE IF:  You have unexplained abdominal pain.  You have abdominal pain associated with nausea or diarrhea.  You have pain when you urinate or have a bowel movement.  You experience abdominal pain that wakes you in the night.  You have abdominal pain that is worsened or improved by eating food.  You have abdominal pain that is worsened with eating fatty foods.  You have a fever. SEEK IMMEDIATE MEDICAL CARE IF:  Your pain does not go away within 2 hours.  You keep throwing up (vomiting).  Your pain is felt only in portions of the abdomen, such as the right side or the left lower portion of the abdomen.  You pass bloody or black tarry stools. MAKE SURE YOU:  Understand these instructions.  Will watch your condition.  Will get help right away if you are not doing well or get worse.   This information is not  intended to replace advice given to you by your health care provider. Make sure you discuss any questions you have with your health care provider.   Document Released: 05/28/2005 Document Revised: 05/09/2015 Document Reviewed: 04/27/2013 Elsevier Interactive Patient Education [employer].

## 2015-10-05 NOTE — ED Notes (Addendum)
Per EMS-states abdominal pain with urination-states she was diagnosed with bladder infection last week-has finished antibiotics-states she has been using AZO-still having complaints of discomfort with urination-per EMS states she was vague r/t why she called 911-states she appears to be lonely and in need of company

## 2015-10-07 LAB — URINE CULTURE
CULTURE: NO GROWTH
Special Requests: NORMAL

## 2015-10-08 ENCOUNTER — Inpatient Hospital Stay (HOSPITAL_COMMUNITY)
Admission: EM | Admit: 2015-10-08 | Discharge: 2015-11-03 | DRG: 100 | Disposition: A | Discharge status: Skilled Nursing Facility | Payer: Medicare Other | Attending: Family Medicine | Admitting: Family Medicine

## 2015-10-08 ENCOUNTER — Emergency Department (HOSPITAL_COMMUNITY): Payer: Medicare Other

## 2015-10-08 ENCOUNTER — Encounter (HOSPITAL_COMMUNITY): Payer: Self-pay

## 2015-10-08 DIAGNOSIS — R509 Fever, unspecified: Secondary | ICD-10-CM

## 2015-10-08 DIAGNOSIS — R51 Headache: Secondary | ICD-10-CM

## 2015-10-08 DIAGNOSIS — G9340 Encephalopathy, unspecified: Secondary | ICD-10-CM

## 2015-10-08 DIAGNOSIS — M4806 Spinal stenosis, lumbar region: Secondary | ICD-10-CM | POA: Diagnosis present

## 2015-10-08 DIAGNOSIS — K623 Rectal prolapse: Secondary | ICD-10-CM

## 2015-10-08 DIAGNOSIS — Z4659 Encounter for fitting and adjustment of other gastrointestinal appliance and device: Secondary | ICD-10-CM

## 2015-10-08 DIAGNOSIS — K219 Gastro-esophageal reflux disease without esophagitis: Secondary | ICD-10-CM | POA: Diagnosis present

## 2015-10-08 DIAGNOSIS — Z66 Do not resuscitate: Secondary | ICD-10-CM | POA: Diagnosis present

## 2015-10-08 DIAGNOSIS — Z7982 Long term (current) use of aspirin: Secondary | ICD-10-CM

## 2015-10-08 DIAGNOSIS — R633 Feeding difficulties, unspecified: Secondary | ICD-10-CM

## 2015-10-08 DIAGNOSIS — R1030 Lower abdominal pain, unspecified: Secondary | ICD-10-CM

## 2015-10-08 DIAGNOSIS — Z23 Encounter for immunization: Secondary | ICD-10-CM

## 2015-10-08 DIAGNOSIS — M48062 Spinal stenosis, lumbar region with neurogenic claudication: Secondary | ICD-10-CM

## 2015-10-08 DIAGNOSIS — R55 Syncope and collapse: Secondary | ICD-10-CM

## 2015-10-08 DIAGNOSIS — I959 Hypotension, unspecified: Secondary | ICD-10-CM | POA: Diagnosis not present

## 2015-10-08 DIAGNOSIS — E038 Other specified hypothyroidism: Secondary | ICD-10-CM

## 2015-10-08 DIAGNOSIS — R519 Headache, unspecified: Secondary | ICD-10-CM

## 2015-10-08 DIAGNOSIS — J9601 Acute respiratory failure with hypoxia: Secondary | ICD-10-CM

## 2015-10-08 DIAGNOSIS — R194 Change in bowel habit: Secondary | ICD-10-CM

## 2015-10-08 DIAGNOSIS — J9600 Acute respiratory failure, unspecified whether with hypoxia or hypercapnia: Secondary | ICD-10-CM | POA: Insufficient documentation

## 2015-10-08 DIAGNOSIS — M62838 Other muscle spasm: Secondary | ICD-10-CM

## 2015-10-08 DIAGNOSIS — Z8673 Personal history of transient ischemic attack (TIA), and cerebral infarction without residual deficits: Secondary | ICD-10-CM

## 2015-10-08 DIAGNOSIS — G4700 Insomnia, unspecified: Secondary | ICD-10-CM

## 2015-10-08 DIAGNOSIS — G40211 Localization-related (focal) (partial) symptomatic epilepsy and epileptic syndromes with complex partial seizures, intractable, with status epilepticus: Secondary | ICD-10-CM | POA: Diagnosis not present

## 2015-10-08 DIAGNOSIS — G8929 Other chronic pain: Secondary | ICD-10-CM

## 2015-10-08 DIAGNOSIS — Z978 Presence of other specified devices: Secondary | ICD-10-CM

## 2015-10-08 DIAGNOSIS — I10 Essential (primary) hypertension: Secondary | ICD-10-CM

## 2015-10-08 DIAGNOSIS — R531 Weakness: Secondary | ICD-10-CM

## 2015-10-08 DIAGNOSIS — E785 Hyperlipidemia, unspecified: Secondary | ICD-10-CM | POA: Diagnosis present

## 2015-10-08 DIAGNOSIS — E876 Hypokalemia: Secondary | ICD-10-CM | POA: Diagnosis not present

## 2015-10-08 DIAGNOSIS — G9341 Metabolic encephalopathy: Secondary | ICD-10-CM | POA: Diagnosis present

## 2015-10-08 DIAGNOSIS — D62 Acute posthemorrhagic anemia: Secondary | ICD-10-CM

## 2015-10-08 DIAGNOSIS — E039 Hypothyroidism, unspecified: Secondary | ICD-10-CM | POA: Diagnosis present

## 2015-10-08 DIAGNOSIS — R200 Anesthesia of skin: Secondary | ICD-10-CM

## 2015-10-08 DIAGNOSIS — I6789 Other cerebrovascular disease: Secondary | ICD-10-CM

## 2015-10-08 DIAGNOSIS — G40901 Epilepsy, unspecified, not intractable, with status epilepticus: Secondary | ICD-10-CM

## 2015-10-08 DIAGNOSIS — F329 Major depressive disorder, single episode, unspecified: Secondary | ICD-10-CM | POA: Diagnosis present

## 2015-10-08 DIAGNOSIS — R441 Visual hallucinations: Secondary | ICD-10-CM | POA: Diagnosis present

## 2015-10-08 DIAGNOSIS — G20 Parkinson's disease: Secondary | ICD-10-CM | POA: Diagnosis present

## 2015-10-08 DIAGNOSIS — Z90710 Acquired absence of both cervix and uterus: Secondary | ICD-10-CM

## 2015-10-08 DIAGNOSIS — G243 Spasmodic torticollis: Secondary | ICD-10-CM

## 2015-10-08 DIAGNOSIS — Z885 Allergy status to narcotic agent status: Secondary | ICD-10-CM

## 2015-10-08 DIAGNOSIS — R109 Unspecified abdominal pain: Secondary | ICD-10-CM

## 2015-10-08 DIAGNOSIS — G20A1 Parkinson's disease without dyskinesia, without mention of fluctuations: Secondary | ICD-10-CM

## 2015-10-08 DIAGNOSIS — E873 Alkalosis: Secondary | ICD-10-CM | POA: Diagnosis present

## 2015-10-08 DIAGNOSIS — K5900 Constipation, unspecified: Secondary | ICD-10-CM | POA: Diagnosis not present

## 2015-10-08 DIAGNOSIS — Z79899 Other long term (current) drug therapy: Secondary | ICD-10-CM

## 2015-10-08 DIAGNOSIS — N390 Urinary tract infection, site not specified: Secondary | ICD-10-CM | POA: Diagnosis present

## 2015-10-08 DIAGNOSIS — Z860100 Personal history of colon polyps, unspecified: Secondary | ICD-10-CM

## 2015-10-08 DIAGNOSIS — F411 Generalized anxiety disorder: Secondary | ICD-10-CM

## 2015-10-08 DIAGNOSIS — Z91013 Allergy to seafood: Secondary | ICD-10-CM

## 2015-10-08 DIAGNOSIS — E871 Hypo-osmolality and hyponatremia: Secondary | ICD-10-CM | POA: Diagnosis not present

## 2015-10-08 DIAGNOSIS — Z91040 Latex allergy status: Secondary | ICD-10-CM

## 2015-10-08 DIAGNOSIS — G249 Dystonia, unspecified: Secondary | ICD-10-CM | POA: Diagnosis present

## 2015-10-08 DIAGNOSIS — I6782 Cerebral ischemia: Secondary | ICD-10-CM | POA: Diagnosis present

## 2015-10-08 DIAGNOSIS — R569 Unspecified convulsions: Secondary | ICD-10-CM

## 2015-10-08 DIAGNOSIS — Z888 Allergy status to other drugs, medicaments and biological substances status: Secondary | ICD-10-CM

## 2015-10-08 DIAGNOSIS — R410 Disorientation, unspecified: Secondary | ICD-10-CM

## 2015-10-08 DIAGNOSIS — R9089 Other abnormal findings on diagnostic imaging of central nervous system: Secondary | ICD-10-CM

## 2015-10-08 DIAGNOSIS — Z881 Allergy status to other antibiotic agents status: Secondary | ICD-10-CM

## 2015-10-08 DIAGNOSIS — J9690 Respiratory failure, unspecified, unspecified whether with hypoxia or hypercapnia: Secondary | ICD-10-CM

## 2015-10-08 DIAGNOSIS — R296 Repeated falls: Secondary | ICD-10-CM

## 2015-10-08 HISTORY — DX: Other cerebrovascular disease: I67.89

## 2015-10-08 LAB — COMPREHENSIVE METABOLIC PANEL
ALBUMIN: 4.1 g/dL (ref 3.5–5.0)
ALK PHOS: 48 U/L (ref 38–126)
ALT: 14 U/L (ref 14–54)
ANION GAP: 15 (ref 5–15)
AST: 26 U/L (ref 15–41)
BUN: 13 mg/dL (ref 6–20)
CALCIUM: 10.3 mg/dL (ref 8.9–10.3)
CHLORIDE: 93 mmol/L — AB (ref 101–111)
CO2: 24 mmol/L (ref 22–32)
Creatinine, Ser: 0.81 mg/dL (ref 0.44–1.00)
GFR calc non Af Amer: >60 mL/min (ref >60)
Glucose, Bld: 112 mg/dL — ABNORMAL HIGH (ref 65–99)
POTASSIUM: 3.8 mmol/L (ref 3.5–5.1)
SODIUM: 132 mmol/L — AB (ref 135–145)
Total Bilirubin: 1 mg/dL (ref 0.3–1.2)
Total Protein: 6.8 g/dL (ref 6.5–8.1)

## 2015-10-08 LAB — CBC
HEMATOCRIT: 35.4 % — AB (ref 36.0–46.0)
HEMOGLOBIN: 11.9 g/dL — AB (ref 12.0–15.0)
MCH: 30.1 pg (ref 26.0–34.0)
MCHC: 33.6 g/dL (ref 30.0–36.0)
MCV: 89.6 fL (ref 78.0–100.0)
Platelets: 214 10*3/uL (ref 150–400)
RBC: 3.95 MIL/uL (ref 3.87–5.11)
RDW: 12.5 % (ref 11.5–15.5)
WBC: 4.9 10*3/uL (ref 4.0–10.5)

## 2015-10-08 LAB — CBG MONITORING, ED: GLUCOSE-CAPILLARY: 110 mg/dL — AB (ref 65–99)

## 2015-10-08 LAB — AMMONIA: Ammonia: 16 umol/L (ref 9–35)

## 2015-10-08 MED ORDER — FENTANYL CITRATE (PF) 100 MCG/2ML IJ SOLN
25.0000 ug | Freq: Once | INTRAMUSCULAR | Status: AC
  Administered 2015-10-08: 25 ug via INTRAVENOUS
  Filled 2015-10-08: qty 2

## 2015-10-08 NOTE — ED Notes (Signed)
Pt was brought in by her son for increased confusion. Pt lives at home alone and her son was called by visitors who found pt at home and apparently had fallen. Pt does not remember the fall. Pt is A&Ox3, did not answer month and year correctly which her son states is not normal for her. No signs of injury or trauma to head or body noted. Denies head pain but reports "spasms of the stomach."

## 2015-10-08 NOTE — ED Notes (Signed)
She states two weeks ago she was started on abx for UTI. Pt seen on 10/05/15 for abdominal pain and dysuria. Today, this RN assisted patient to restroom because she reported she felt like she had to urinate but while in bathroom pt was not able to urinate.

## 2015-10-08 NOTE — ED Provider Notes (Signed)
CSN: [id number]     Arrival date & time 10/08/15  1506 History   First MD Initiated Contact with Patient 10/08/15 2025     Chief Complaint  Patient presents with  . Altered Mental Status     (Consider location/radiation/quality/duration/timing/severity/associated sxs/prior Treatment) HPI Level V caveat applies 2/2 dementia and delirium. The patient is a 82 year old female who presents with concerns of increased confusion, disorientation, and hallucinations. She was found laying on the ground today by [employer] and she could not get up. Patient states that she does not remember the fall but does remember not being able to get up. She reports "stomach spasms" but otherwise without complaints. History largely obtained from family at bedside. They state that she has had increased hallucinations and appears to have a waxing and waning confusion course worsening over the past few weeks. She was recently evaluated at [facility] for her abdominal pain.  Past Medical History  Diagnosis Date  . Depression   . Hypertension   . High cholesterol   . Rectal prolapse   . Hx of adenomatous colonic polyps   . Internal hemorrhoids   . Hypothyroidism   . Fecal incontinence   . TIA (transient ischemic attack)   . Stroke ([place]) , sept 1, 2010sept 12, 2011    x 2  . History of frequent urinary tract infections     recent  . Migraine   . Fatty liver 03/20/13  . Generalized ischemic cerebrovascular disease 10/09/2015   Past Surgical History  Procedure Laterality Date  . Colon surgery  2010, for prolapsed organs after hystecrtomy surgery  . Appendectomy  2008  . Abdominal hysterectomy  2010  . Cholecystectomy    . Lumbar laminectomy/decompression microdiscectomy N/A 01/07/2013    Procedure: LUMBAR LAMINECTOMY CENTRAL DECOMPRESSION L4-L5, BILATERAL FORAMENOTOMY L4,L5    ;  Surgeon: Tobi Bastos, MD;  Location: [facility];  Service: Orthopedics;  Laterality: N/A;   Family History  Problem Relation Age  of Onset  . Stroke Father   . Migraines Father   . CVA Father   . Heart attack Father   . Hypertension Maternal Aunt     x3  . Colon cancer Neg Hx   . CVA Mother    Social History  Substance Use Topics  . Smoking status: Never Smoker   . Smokeless tobacco: Never Used  . Alcohol Use: No   OB History    No data available     Review of Systems  Constitutional: Negative for fever and chills.  HENT: Negative.   Eyes: Negative for photophobia.  Respiratory: Negative for cough and shortness of breath.   Cardiovascular: Negative for chest pain and leg swelling.  Gastrointestinal: Negative for nausea, vomiting and diarrhea.  Genitourinary: Negative.   Musculoskeletal: Negative for neck pain.  Skin: Negative for pallor, rash and wound.  Neurological: Negative for headaches.  Psychiatric/Behavioral: Positive for hallucinations (visual hallucinations).      Allergies  Augmentin; Citalopram; Codeine; Fish-derived products; Hyoscyamine; Ibuprofen; Latex; Lipitor; Septra; Shellfish allergy; Miconazole; Hydrocodone-acetaminophen; Keflet; Meloxicam; Sulfa antibiotics; Verapamil; Vicodin; Zoloft; Ciprofloxacin; Depakene; Isoptin sr; Lisinopril; and Telmisartan-hctz  Home Medications   Prior to Admission medications   Medication Sig Start Date End Date Taking? Authorizing Provider  amLODipine (NORVASC) 2.5 MG tablet Take 5 mg by mouth daily.    Yes Historical Provider, MD  aspirin EC 81 MG tablet Take 81 mg by mouth daily.   Yes Historical Provider, MD  Calcium Carbonate-Vitamin D (CALCIUM +  D PO) Take 1 tablet by mouth daily. Reported on 10/08/2015   Yes Historical Provider, MD  ciprofloxacin (CIPRO) 500 MG tablet Take 500 mg by mouth 2 (two) times daily.   Yes Historical Provider, MD  clonazePAM (KLONOPIN) 1 MG tablet Take 0.5-1 mg by mouth at bedtime as needed for anxiety. Reported on 10/05/2015 01/23/12  Yes Lafayette Dragon, MD  fexofenadine (ALLEGRA) 180 MG tablet Take 180 mg by mouth  daily.   Yes Historical Provider, MD  glycopyrrolate ([name]) 2 MG tablet TAKE ONE TABLET BY MOUTH TWICE DAILY 03/27/15  Yes Lafayette Dragon, MD  losartan (COZAAR) 100 MG tablet Take 100 mg by mouth daily.   Yes Historical Provider, MD  Naproxen Sod-Diphenhydramine (ALEVE PM) 220-25 MG TABS Take 1 tablet by mouth at bedtime.   Yes Historical Provider, MD  nitrofurantoin, macrocrystal-monohydrate, (MACROBID) 100 MG capsule Take 100 mg by mouth 2 (two) times daily.   Yes Historical Provider, MD  PRESCRIPTION MEDICATION Pt gets allergy shots once weekly. Dr Donneta Romberg.   Yes Historical Provider, MD  Probiotic Product (PROBIOTIC DAILY PO) Take 1 tablet by mouth daily.   Yes Historical Provider, MD  vitamin B-12 (CYANOCOBALAMIN) 100 MCG tablet Take 100 mcg by mouth daily.   Yes Historical Provider, MD  vitamin C (ASCORBIC ACID) 500 MG tablet Take 500 mg by mouth daily.   Yes Historical Provider, MD  vitamin E 200 UNIT capsule Take 200 Units by mouth daily.    Yes Historical Provider, MD  diazepam (VALIUM) 2 MG tablet Take 2 mg by mouth every 6 (six) hours as needed for anxiety. Reported on 10/05/2015    Historical Provider, MD  doxycycline (VIBRAMYCIN) 100 MG capsule Take 1 capsule (100 mg total) by mouth 2 (two) times daily. Patient not taking: Reported on 10/08/2015 07/31/15   Shawn C Joy, PA-C  estradiol (VIVELLE-DOT) 0.0375 MG/24HR Place 0.5 patches onto the skin 2 (two) times a week. Sunday and Wed    Historical Provider, MD  fluticasone (FLONASE) 50 MCG/ACT nasal spray Place 2 sprays into the nose daily as needed for allergies.     Historical Provider, MD  nystatin-triamcinolone (MYCOLOG II) cream Apply 1 application topically 2 (two) times daily.    Historical Provider, MD  pantoprazole (PROTONIX) 40 MG tablet Take 1 tablet (40 mg total) by mouth every morning. 04/18/13   Terance Ice, MD  propranolol (INDERAL) 40 MG tablet Take 40 mg by mouth 3 (three) times daily.    Historical Provider, MD   simvastatin (ZOCOR) 40 MG tablet Take 40 mg by mouth every morning.     Historical Provider, MD  traMADol-acetaminophen (ULTRACET) 37.5-325 MG per tablet Take 1 tablet by mouth every 8 (eight) hours as needed. Patient not taking: Reported on 10/08/2015 06/16/14   Kristen N Ward, DO   BP 146/76 mmHg  Pulse 76  Resp 18  SpO2 100% Physical Exam  Constitutional: She appears ill.  HENT:  Head: Normocephalic and atraumatic.  Eyes: EOM are normal. Pupils are equal, round, and reactive to light. No scleral icterus.  Neck: Normal range of motion. Neck supple.  Cardiovascular: Normal rate, regular rhythm, normal heart sounds and intact distal pulses.   Pulmonary/Chest: Effort normal and breath sounds normal. No respiratory distress. She exhibits no tenderness.  Abdominal: Soft. She exhibits no distension. There is no tenderness. There is no rebound and no guarding.  Musculoskeletal: Normal range of motion. She exhibits no edema or tenderness.  Neurological: She is alert. She has normal  strength. She is disoriented. She displays tremor (worse on left compared to right and worse with purposeful action). No cranial nerve deficit or sensory deficit. GCS eye subscore is 4. GCS verbal subscore is 4. GCS motor subscore is 6.  Skin: Skin is warm and dry. No rash noted. No erythema.  Psychiatric: Her mood appears anxious. She is actively hallucinating.  Nursing note and vitals reviewed.   ED Course  Procedures (including critical care time) Labs Review Labs Reviewed  COMPREHENSIVE METABOLIC PANEL - Abnormal; Notable for the following:    Sodium 132 (*)    Chloride 93 (*)    Glucose, Bld 112 (*)    All other components within normal limits  CBC - Abnormal; Notable for the following:    Hemoglobin 11.9 (*)    HCT 35.4 (*)    All other components within normal limits  URINALYSIS, ROUTINE W REFLEX MICROSCOPIC (NOT AT [facility]) - Abnormal; Notable for the following:    APPearance CLOUDY (*)    Ketones, ur  15 (*)    Nitrite POSITIVE (*)    Leukocytes, UA SMALL (*)    All other components within normal limits  LIPID PANEL - Abnormal; Notable for the following:    LDL Cholesterol 119 (*)    All other components within normal limits  CBC - Abnormal; Notable for the following:    RBC 3.74 (*)    Hemoglobin 11.3 (*)    HCT 33.3 (*)    All other components within normal limits  BASIC METABOLIC PANEL - Abnormal; Notable for the following:    Sodium 134 (*)    Chloride 97 (*)    All other components within normal limits  VITAMIN B12 - Abnormal; Notable for the following:    Vitamin B-12 2665 (*)    All other components within normal limits  URINE MICROSCOPIC-ADD ON - Abnormal; Notable for the following:    Squamous Epithelial / LPF 0-5 (*)    Bacteria, UA MANY (*)    All other components within normal limits  CBG MONITORING, ED - Abnormal; Notable for the following:    Glucose-Capillary 110 (*)    All other components within normal limits  CBG MONITORING, ED - Abnormal; Notable for the following:    Glucose-Capillary 100 (*)    All other components within normal limits  URINE CULTURE  CULTURE, BLOOD (ROUTINE X 2)  CULTURE, BLOOD (ROUTINE X 2)  AMMONIA  CK  BRAIN NATRIURETIC PEPTIDE  PROTIME-INR  APTT  LIPASE, BLOOD  TSH  I-STAT TROPOININ, ED    Imaging Review Dg Chest 2 View  10/08/2015  CLINICAL DATA:  Chronic generalized chest and abdominal pain. Delirium. Initial encounter. EXAM: CHEST  2 VIEW COMPARISON:  Chest radiograph performed 06/16/2014 FINDINGS: The lungs are well-aerated and clear. There is no evidence of focal opacification, pleural effusion or pneumothorax. The heart is normal in size; the mediastinal contour is within normal limits. No acute osseous abnormalities are seen. A chronic compression deformity is noted at the lower thoracic spine. IMPRESSION: No acute cardiopulmonary process seen. Electronically Signed   By: Garald Balding M.D.   On: 10/08/2015 23:58   Ct  Head Wo Contrast  10/08/2015  CLINICAL DATA:  Increased confusion. Altered mental status. Fall. Patient amnestic for the event. EXAM: CT HEAD WITHOUT CONTRAST TECHNIQUE: Contiguous axial images were obtained from the base of the skull through the vertex without intravenous contrast. COMPARISON:  MR brain 05/22/2013.  CT head 05/03/2012. FINDINGS: No evidence for  acute infarction, hemorrhage, mass lesion, hydrocephalus, or extra-axial fluid. Generalized atrophy. Chronic microvascular ischemic change. Scattered areas of chronic lacunar infarction. No CT signs of proximal vascular thrombosis. Advanced vascular calcification in the carotid and vertebral arteries. Calvarium is intact. No sinus or mastoid air fluid level. Similar appearance to priors. IMPRESSION: Atrophy and small vessel disease. Areas of chronic ischemia. No skull fracture or intracranial hemorrhage. Electronically Signed   By: Staci Righter M.D.   On: 10/08/2015 23:02   Mr Brain Wo Contrast  10/09/2015  CLINICAL DATA:  82 year old hypertensive female found down at home with altered mental status. Subsequent encounter. EXAM: MRI HEAD WITHOUT CONTRAST TECHNIQUE: Multiplanar, multiecho pulse sequences of the brain and surrounding structures were obtained without intravenous contrast. COMPARISON:  10/08/2015 CT.  05/22/2013 brain MR. FINDINGS: Exam is motion degraded. Probable artifact left midbrain (series 4, image 18) without discrete acute infarct noted. Remote infarcts basal ganglia and corona radiata bilaterally. Remote infarct at the junction of the right thalamus and posterior limb right internal capsule. Mild to moderate chronic small vessel disease changes. No intracranial hemorrhage. Global atrophy without hydrocephalus. No intracranial mass lesion noted on this unenhanced exam. Major intracranial vascular structures are patent. Post lens replacement otherwise orbital structures unremarkable. Cervical medullary junction and pituitary region  unremarkable. IMPRESSION: Exam is motion degraded. Probable artifact left midbrain without acute infarct noted. Remote infarcts basal ganglia and corona radiata bilaterally. Remote infarct at the junction of the right thalamus and posterior limb right internal capsule. Mild to moderate chronic small vessel disease changes. Global atrophy without hydrocephalus. Electronically Signed   By: Genia Del M.D.   On: 10/09/2015 08:57   I have personally reviewed and evaluated these images and lab results as part of my medical decision-making.   EKG Interpretation   Date/Time:  Monday October 08 2015 23:35:37 EST Ventricular Rate:  79 PR Interval:  190 QRS Duration: 82 QT Interval:  406 QTC Calculation: 465 R Axis:   24 Text Interpretation:  Sinus rhythm Atrial premature complexes Consider  anterior infarct No significant change since last tracing Confirmed by  POLLINA  MD, CHRISTOPHER [phone]) on 10/08/2015 11:44:19 PM      MDM   Final diagnoses:  Delirium  Numbness of foot    Pt is a 85yoF who presents after being found down by [facility] in the setting of worsening confusion, disorientation, and hallucinations. No fever, cough, or dysuria at this time. Further history and exam as above. VSS, benign abdomen, but patient does appear confused with unreliable history. GIven waxing and waning nature of symptoms, concern for delirium at this time. Labs reassuring, head ct without acute findings, cxr reassuring, ekg sinus rhythm and trop neg. Patient unable to urinate for [place] at this time. Will attempt I&O cath. Patient will be admitted to medicine for further management and evaluation.     Heriberto Antigua, MD 10/09/15 Pierron Yao, MD 10/09/15 [phone]

## 2015-10-09 ENCOUNTER — Encounter (HOSPITAL_COMMUNITY): Payer: Self-pay | Admitting: Radiology

## 2015-10-09 ENCOUNTER — Inpatient Hospital Stay (HOSPITAL_COMMUNITY): Payer: Medicare Other

## 2015-10-09 DIAGNOSIS — R410 Disorientation, unspecified: Secondary | ICD-10-CM | POA: Diagnosis not present

## 2015-10-09 DIAGNOSIS — G9341 Metabolic encephalopathy: Secondary | ICD-10-CM | POA: Diagnosis not present

## 2015-10-09 DIAGNOSIS — Z881 Allergy status to other antibiotic agents status: Secondary | ICD-10-CM | POA: Diagnosis not present

## 2015-10-09 DIAGNOSIS — R109 Unspecified abdominal pain: Secondary | ICD-10-CM | POA: Diagnosis not present

## 2015-10-09 DIAGNOSIS — M62838 Other muscle spasm: Secondary | ICD-10-CM | POA: Diagnosis not present

## 2015-10-09 DIAGNOSIS — I6782 Cerebral ischemia: Secondary | ICD-10-CM | POA: Diagnosis not present

## 2015-10-09 DIAGNOSIS — F411 Generalized anxiety disorder: Secondary | ICD-10-CM

## 2015-10-09 DIAGNOSIS — G9340 Encephalopathy, unspecified: Secondary | ICD-10-CM | POA: Diagnosis present

## 2015-10-09 DIAGNOSIS — Z7982 Long term (current) use of aspirin: Secondary | ICD-10-CM | POA: Diagnosis not present

## 2015-10-09 DIAGNOSIS — Z8673 Personal history of transient ischemic attack (TIA), and cerebral infarction without residual deficits: Secondary | ICD-10-CM

## 2015-10-09 DIAGNOSIS — Z885 Allergy status to narcotic agent status: Secondary | ICD-10-CM | POA: Diagnosis not present

## 2015-10-09 DIAGNOSIS — R441 Visual hallucinations: Secondary | ICD-10-CM | POA: Diagnosis not present

## 2015-10-09 DIAGNOSIS — R200 Anesthesia of skin: Secondary | ICD-10-CM | POA: Insufficient documentation

## 2015-10-09 DIAGNOSIS — Z789 Other specified health status: Secondary | ICD-10-CM | POA: Diagnosis not present

## 2015-10-09 DIAGNOSIS — R296 Repeated falls: Secondary | ICD-10-CM | POA: Diagnosis not present

## 2015-10-09 DIAGNOSIS — R208 Other disturbances of skin sensation: Secondary | ICD-10-CM

## 2015-10-09 DIAGNOSIS — E039 Hypothyroidism, unspecified: Secondary | ICD-10-CM | POA: Diagnosis not present

## 2015-10-09 DIAGNOSIS — G8929 Other chronic pain: Secondary | ICD-10-CM | POA: Diagnosis present

## 2015-10-09 DIAGNOSIS — R569 Unspecified convulsions: Secondary | ICD-10-CM | POA: Diagnosis not present

## 2015-10-09 DIAGNOSIS — I6789 Other cerebrovascular disease: Secondary | ICD-10-CM

## 2015-10-09 DIAGNOSIS — K219 Gastro-esophageal reflux disease without esophagitis: Secondary | ICD-10-CM

## 2015-10-09 DIAGNOSIS — E876 Hypokalemia: Secondary | ICD-10-CM | POA: Diagnosis not present

## 2015-10-09 DIAGNOSIS — G40909 Epilepsy, unspecified, not intractable, without status epilepticus: Secondary | ICD-10-CM | POA: Diagnosis not present

## 2015-10-09 DIAGNOSIS — G40211 Localization-related (focal) (partial) symptomatic epilepsy and epileptic syndromes with complex partial seizures, intractable, with status epilepticus: Secondary | ICD-10-CM | POA: Diagnosis not present

## 2015-10-09 DIAGNOSIS — G249 Dystonia, unspecified: Secondary | ICD-10-CM | POA: Diagnosis not present

## 2015-10-09 DIAGNOSIS — J9600 Acute respiratory failure, unspecified whether with hypoxia or hypercapnia: Secondary | ICD-10-CM | POA: Diagnosis not present

## 2015-10-09 DIAGNOSIS — N390 Urinary tract infection, site not specified: Secondary | ICD-10-CM | POA: Diagnosis not present

## 2015-10-09 DIAGNOSIS — R1030 Lower abdominal pain, unspecified: Secondary | ICD-10-CM | POA: Diagnosis not present

## 2015-10-09 DIAGNOSIS — J9601 Acute respiratory failure with hypoxia: Secondary | ICD-10-CM | POA: Diagnosis not present

## 2015-10-09 DIAGNOSIS — E785 Hyperlipidemia, unspecified: Secondary | ICD-10-CM | POA: Diagnosis not present

## 2015-10-09 DIAGNOSIS — K5900 Constipation, unspecified: Secondary | ICD-10-CM | POA: Diagnosis not present

## 2015-10-09 DIAGNOSIS — I10 Essential (primary) hypertension: Secondary | ICD-10-CM

## 2015-10-09 DIAGNOSIS — Z79899 Other long term (current) drug therapy: Secondary | ICD-10-CM

## 2015-10-09 DIAGNOSIS — Z23 Encounter for immunization: Secondary | ICD-10-CM | POA: Diagnosis not present

## 2015-10-09 DIAGNOSIS — Z91040 Latex allergy status: Secondary | ICD-10-CM | POA: Diagnosis not present

## 2015-10-09 DIAGNOSIS — Z91013 Allergy to seafood: Secondary | ICD-10-CM | POA: Diagnosis not present

## 2015-10-09 DIAGNOSIS — E871 Hypo-osmolality and hyponatremia: Secondary | ICD-10-CM | POA: Diagnosis not present

## 2015-10-09 DIAGNOSIS — E873 Alkalosis: Secondary | ICD-10-CM | POA: Diagnosis not present

## 2015-10-09 DIAGNOSIS — G20 Parkinson's disease: Secondary | ICD-10-CM | POA: Diagnosis not present

## 2015-10-09 DIAGNOSIS — Z888 Allergy status to other drugs, medicaments and biological substances status: Secondary | ICD-10-CM | POA: Diagnosis not present

## 2015-10-09 DIAGNOSIS — J189 Pneumonia, unspecified organism: Secondary | ICD-10-CM | POA: Diagnosis not present

## 2015-10-09 DIAGNOSIS — I959 Hypotension, unspecified: Secondary | ICD-10-CM | POA: Diagnosis not present

## 2015-10-09 DIAGNOSIS — Z90710 Acquired absence of both cervix and uterus: Secondary | ICD-10-CM | POA: Diagnosis not present

## 2015-10-09 DIAGNOSIS — M4806 Spinal stenosis, lumbar region: Secondary | ICD-10-CM

## 2015-10-09 DIAGNOSIS — Z66 Do not resuscitate: Secondary | ICD-10-CM | POA: Diagnosis not present

## 2015-10-09 DIAGNOSIS — F329 Major depressive disorder, single episode, unspecified: Secondary | ICD-10-CM | POA: Diagnosis not present

## 2015-10-09 DIAGNOSIS — G40901 Epilepsy, unspecified, not intractable, with status epilepticus: Secondary | ICD-10-CM | POA: Diagnosis not present

## 2015-10-09 HISTORY — DX: Other cerebrovascular disease: I67.89

## 2015-10-09 LAB — URINALYSIS, ROUTINE W REFLEX MICROSCOPIC
Bilirubin Urine: NEGATIVE
Glucose, UA: NEGATIVE mg/dL
Hgb urine dipstick: NEGATIVE
Ketones, ur: 15 mg/dL — AB
Nitrite: POSITIVE — AB
PROTEIN: NEGATIVE mg/dL
Specific Gravity, Urine: 1.009 (ref 1.005–1.030)
pH: 6.5 (ref 5.0–8.0)

## 2015-10-09 LAB — VITAMIN B12: Vitamin B-12: 2665 pg/mL — ABNORMAL HIGH (ref 180–914)

## 2015-10-09 LAB — BASIC METABOLIC PANEL
Anion gap: 14 (ref 5–15)
BUN: 11 mg/dL (ref 6–20)
CALCIUM: 10.3 mg/dL (ref 8.9–10.3)
CHLORIDE: 97 mmol/L — AB (ref 101–111)
CO2: 23 mmol/L (ref 22–32)
CREATININE: 0.7 mg/dL (ref 0.44–1.00)
GFR calc non Af Amer: >60 mL/min (ref >60)
Glucose, Bld: 97 mg/dL (ref 65–99)
Potassium: 3.7 mmol/L (ref 3.5–5.1)
SODIUM: 134 mmol/L — AB (ref 135–145)

## 2015-10-09 LAB — LIPID PANEL
CHOLESTEROL: 181 mg/dL (ref 0–200)
HDL: 53 mg/dL (ref >40)
LDL Cholesterol: 119 mg/dL — ABNORMAL HIGH (ref 0–99)
Total CHOL/HDL Ratio: 3.4 RATIO
Triglycerides: 45 mg/dL (ref <150)
VLDL: 9 mg/dL (ref 0–40)

## 2015-10-09 LAB — URINE MICROSCOPIC-ADD ON

## 2015-10-09 LAB — APTT: APTT: 29 s (ref 24–37)

## 2015-10-09 LAB — BRAIN NATRIURETIC PEPTIDE: B NATRIURETIC PEPTIDE 5: 64.5 pg/mL (ref 0.0–100.0)

## 2015-10-09 LAB — TSH: TSH: 1.546 u[IU]/mL (ref 0.350–4.500)

## 2015-10-09 LAB — LIPASE, BLOOD: LIPASE: 35 U/L (ref 11–51)

## 2015-10-09 LAB — CBG MONITORING, ED: Glucose-Capillary: 100 mg/dL — ABNORMAL HIGH (ref 65–99)

## 2015-10-09 LAB — CBC
HCT: 33.3 % — ABNORMAL LOW (ref 36.0–46.0)
HEMOGLOBIN: 11.3 g/dL — AB (ref 12.0–15.0)
MCH: 30.2 pg (ref 26.0–34.0)
MCHC: 33.9 g/dL (ref 30.0–36.0)
MCV: 89 fL (ref 78.0–100.0)
Platelets: 214 10*3/uL (ref 150–400)
RBC: 3.74 MIL/uL — ABNORMAL LOW (ref 3.87–5.11)
RDW: 12.6 % (ref 11.5–15.5)
WBC: 5.6 10*3/uL (ref 4.0–10.5)

## 2015-10-09 LAB — I-STAT TROPONIN, ED: TROPONIN I, POC: 0.01 ng/mL (ref 0.00–0.08)

## 2015-10-09 LAB — PROTIME-INR
INR: 1.16 (ref 0.00–1.49)
Prothrombin Time: 14.9 seconds (ref 11.6–15.2)

## 2015-10-09 LAB — CK: CK TOTAL: 79 U/L (ref 38–234)

## 2015-10-09 MED ORDER — RISAQUAD PO CAPS
1.0000 | ORAL_CAPSULE | Freq: Every day | ORAL | Status: DC
Start: 2015-10-09 — End: 2015-10-11
  Administered 2015-10-09: 1 via ORAL
  Filled 2015-10-09: qty 1

## 2015-10-09 MED ORDER — VITAMIN C 500 MG PO TABS
500.0000 mg | ORAL_TABLET | Freq: Every day | ORAL | Status: DC
  Administered 2015-10-09: 500 mg via ORAL
  Filled 2015-10-09: qty 1

## 2015-10-09 MED ORDER — ENOXAPARIN SODIUM 40 MG/0.4ML ~~LOC~~ SOLN
40.0000 mg | Freq: Every day | SUBCUTANEOUS | Status: DC
  Administered 2015-10-09 – 2015-10-10 (×2): 40 mg via SUBCUTANEOUS
  Filled 2015-10-09 (×2): qty 0.4

## 2015-10-09 MED ORDER — ESTRADIOL 0.025 MG/24HR TD PTWK
0.0250 mg | MEDICATED_PATCH | TRANSDERMAL | Status: DC
  Administered 2015-10-10: 0.025 mg via TRANSDERMAL
  Filled 2015-10-09: qty 1

## 2015-10-09 MED ORDER — INFLUENZA VAC SPLIT QUAD 0.5 ML IM SUSY
0.5000 mL | PREFILLED_SYRINGE | INTRAMUSCULAR | Status: AC
  Administered 2015-10-10: 0.5 mL via INTRAMUSCULAR
  Filled 2015-10-09: qty 0.5

## 2015-10-09 MED ORDER — NYSTATIN-TRIAMCINOLONE 100000-0.1 UNIT/GM-% EX CREA
1.0000 "application " | TOPICAL_CREAM | Freq: Two times a day (BID) | CUTANEOUS | Status: DC
  Administered 2015-10-09 – 2015-11-03 (×43): 1 via TOPICAL
  Filled 2015-10-09 (×3): qty 15

## 2015-10-09 MED ORDER — VITAMIN E 45 MG (100 UNIT) PO CAPS
200.0000 [IU] | ORAL_CAPSULE | Freq: Every day | ORAL | Status: DC
  Administered 2015-10-09: 200 [IU] via ORAL
  Filled 2015-10-09 (×4): qty 2

## 2015-10-09 MED ORDER — FLUTICASONE PROPIONATE 50 MCG/ACT NA SUSP: 2.0000 | Freq: Every day | NASAL | Status: DC | PRN

## 2015-10-09 MED ORDER — PANTOPRAZOLE SODIUM 40 MG PO TBEC
40.0000 mg | DELAYED_RELEASE_TABLET | Freq: Every morning | ORAL | Status: DC
  Administered 2015-10-09: 40 mg via ORAL
  Filled 2015-10-09: qty 1

## 2015-10-09 MED ORDER — SODIUM CHLORIDE 0.9% FLUSH
3.0000 mL | Freq: Two times a day (BID) | INTRAVENOUS | Status: DC
Start: 2015-10-09 — End: 2015-11-03
  Administered 2015-10-09 – 2015-10-27 (×21): 3 mL via INTRAVENOUS
  Administered 2015-10-27: 10 mL via INTRAVENOUS
  Administered 2015-10-27: 22:00:00 via INTRAVENOUS
  Administered 2015-10-28: 10 mL via INTRAVENOUS
  Administered 2015-10-29 – 2015-11-03 (×6): 3 mL via INTRAVENOUS

## 2015-10-09 MED ORDER — ASPIRIN EC 81 MG PO TBEC
81.0000 mg | DELAYED_RELEASE_TABLET | Freq: Every day | ORAL | Status: DC
  Administered 2015-10-09: 81 mg via ORAL
  Filled 2015-10-09: qty 1

## 2015-10-09 MED ORDER — LOSARTAN POTASSIUM 50 MG PO TABS
100.0000 mg | ORAL_TABLET | Freq: Every day | ORAL | Status: DC
  Administered 2015-10-09: 100 mg via ORAL
  Filled 2015-10-09: qty 2

## 2015-10-09 MED ORDER — VITAMIN B-12 1000 MCG PO TABS
1000.0000 ug | ORAL_TABLET | Freq: Every day | ORAL | Status: DC
  Administered 2015-10-09: 1000 ug via ORAL
  Filled 2015-10-09: qty 1

## 2015-10-09 MED ORDER — LORAZEPAM 2 MG/ML IJ SOLN
0.5000 mg | INTRAMUSCULAR | Status: DC | PRN
  Administered 2015-10-10: 0.5 mg via INTRAVENOUS
  Filled 2015-10-09 (×3): qty 1

## 2015-10-09 MED ORDER — HYDRALAZINE HCL 20 MG/ML IJ SOLN
5.0000 mg | INTRAMUSCULAR | Status: DC | PRN
  Administered 2015-10-12 – 2015-10-31 (×5): 5 mg via INTRAVENOUS
  Filled 2015-10-09 (×5): qty 1

## 2015-10-09 MED ORDER — ONDANSETRON HCL 4 MG/2ML IJ SOLN: 4.0000 mg | Freq: Four times a day (QID) | INTRAMUSCULAR | Status: DC | PRN

## 2015-10-09 MED ORDER — PROPRANOLOL HCL 40 MG PO TABS
40.0000 mg | ORAL_TABLET | Freq: Three times a day (TID) | ORAL | Status: DC
  Administered 2015-10-09 (×3): 40 mg via ORAL
  Filled 2015-10-09 (×10): qty 1

## 2015-10-09 MED ORDER — SODIUM CHLORIDE 0.9 % IV SOLN
INTRAVENOUS | Status: DC
  Administered 2015-10-09 – 2015-10-11 (×4): via INTRAVENOUS

## 2015-10-09 MED ORDER — ONDANSETRON HCL 4 MG PO TABS: 4.0000 mg | ORAL_TABLET | Freq: Four times a day (QID) | ORAL | Status: DC | PRN

## 2015-10-09 MED ORDER — CALCIUM CARBONATE-VITAMIN D 500-200 MG-UNIT PO TABS
1.0000 | ORAL_TABLET | Freq: Every day | ORAL | Status: DC
  Administered 2015-10-09: 1 via ORAL
  Filled 2015-10-09 (×2): qty 1

## 2015-10-09 MED ORDER — AMLODIPINE BESYLATE 5 MG PO TABS
5.0000 mg | ORAL_TABLET | Freq: Every day | ORAL | Status: DC
  Administered 2015-10-09: 5 mg via ORAL
  Filled 2015-10-09: qty 1

## 2015-10-09 MED ORDER — SIMVASTATIN 40 MG PO TABS
40.0000 mg | ORAL_TABLET | Freq: Every morning | ORAL | Status: DC
  Administered 2015-10-09: 40 mg via ORAL
  Filled 2015-10-09: qty 1

## 2015-10-09 MED ORDER — LEVOFLOXACIN IN D5W 750 MG/150ML IV SOLN
750.0000 mg | INTRAVENOUS | Status: DC
  Administered 2015-10-09 – 2015-10-11 (×2): 750 mg via INTRAVENOUS
  Filled 2015-10-09 (×3): qty 150

## 2015-10-09 NOTE — ED Notes (Signed)
Patient transported to MRI 

## 2015-10-09 NOTE — Progress Notes (Addendum)
Progress Note   Kathryn [name] [id number] DOB: [birth year] DOA: 10/08/2015 PCP: Lilian Coma, MD   Brief Narrative:   Kathryn [name] is an 82 y.o. female with a PMH of hypertension, hyperlipidemia, GERD, hypothyroidism, depression/anxiety, TIA/stroke, recurrent UTI, chronic back and abdominal pain who was admitted 10/08/15 with altered mental status and hallucinations in the setting of polypharmacy.  Assessment/Plan:   Principal Problem:   Acute encephalopathy in the setting of polypharmacy with frequent recurrent falls/history of stroke and chronic ischemic cerebrovascular disease - Mental status changes felt to be due to polypharmacy in the setting of anticholinergics and anxiolytics. - CT of the head shows chronic ischemic changes but nothing acute. - Because of leg weakness/foot numbness, stroke to be ruled out with MRI of the brain. - Continue to hold anticholinergics including [name] and diphenhydramine. - Continue to hold benzodiazepines including Valium and Klonopin. - No evidence of B-12 deficiency or thyroid dysfunction. - Ammonia and CK levels WNL. - Continue IV fluids for now. - Continue aspirin. - PT/OT evaluations.  Active Problems:   UTI - Follow-up urine cultures. On empiric Levaquin.    Essential hypertension - Continue Norvasc and Cozaar.    Spinal stenosis, lumbar region, with neurogenic claudication - PT evaluation requested. Avoid narcotics/muscle relaxants for now.    Generalized anxiety disorder - Holding benzodiazepines except for Ativan at low dose if needed.    GERD (gastroesophageal reflux disease) - Continue Protonix.    Chronic abdominal pain - Status post extensive workup in the past with no etiology found. - Continue Protonix and Zofran as needed for nausea. - Lipase not elevated.    Hyperlipidemia - Continue Zocor. LDL 119.    DVT Prophylaxis - Lovenox ordered.   Family Communication/Anticipated D/C date and plan/Code Status     Family Communication: No family currently at the bedside. Son updated by telephone. Disposition Plan: Home vs SNF when mental status improves. Anticipated D/C date:   10/10/15 or 10/11/15. Code Status: Full code.   IV Access:    Peripheral IV   Procedures and diagnostic studies:   Dg Chest 2 View  10/08/2015  CLINICAL DATA:  Chronic generalized chest and abdominal pain. Delirium. Initial encounter. EXAM: CHEST  2 VIEW COMPARISON:  Chest radiograph performed 06/16/2014 FINDINGS: The lungs are well-aerated and clear. There is no evidence of focal opacification, pleural effusion or pneumothorax. The heart is normal in size; the mediastinal contour is within normal limits. No acute osseous abnormalities are seen. A chronic compression deformity is noted at the lower thoracic spine. IMPRESSION: No acute cardiopulmonary process seen. Electronically Signed   By: Garald Balding M.D.   On: 10/08/2015 23:58   Ct Head Wo Contrast  10/08/2015  CLINICAL DATA:  Increased confusion. Altered mental status. Fall. Patient amnestic for the event. EXAM: CT HEAD WITHOUT CONTRAST TECHNIQUE: Contiguous axial images were obtained from the base of the skull through the vertex without intravenous contrast. COMPARISON:  MR brain 05/22/2013.  CT head 05/03/2012. FINDINGS: No evidence for acute infarction, hemorrhage, mass lesion, hydrocephalus, or extra-axial fluid. Generalized atrophy. Chronic microvascular ischemic change. Scattered areas of chronic lacunar infarction. No CT signs of proximal vascular thrombosis. Advanced vascular calcification in the carotid and vertebral arteries. Calvarium is intact. No sinus or mastoid air fluid level. Similar appearance to priors. IMPRESSION: Atrophy and small vessel disease. Areas of chronic ischemia. No skull fracture or intracranial hemorrhage. Electronically Signed   By: Staci Righter M.D.   On: 10/08/2015 23:02  Medical Consultants:    None.  Anti-Infectives:    Levaquin 10/09/15--->  Subjective:   Kathryn [name] is still altered.  She awakens to voice, and knows she is in the hospital and the month is February, but talks tangentially unless redirected multiple times with specific/easy to understand questions. She complains of a headache  Objective:    Filed Vitals:   10/09/15 0530 10/09/15 0545 10/09/15 0600 10/09/15 0630  BP: 145/78 142/69 145/80 145/78  Pulse: 81 81 85 84  Resp: 21 16 16 20   SpO2: 99% 99% 99% 100%   No intake or output data in the 24 hours ending 10/09/15 0823 There were no vitals filed for this visit.  Exam: Gen: NAD Neuro:  Oriented x 2, non-focal but with monoclonic jerking spells, word finding difficulty, follows commands Cardiovascular:  RRR, No M/R/G Respiratory:  Lungs CTAB Gastrointestinal:  Abdomen soft, NT/ND, + BS Extremities:  No C/E/C   Data Reviewed:    Labs: Basic Metabolic Panel:  Recent Labs Lab 10/05/15 1504 10/08/15 2059 10/09/15 0229  NA 133* 132* 134*  K 3.7 3.8 3.7  CL 96* 93* 97*  CO2 27 24 23   GLUCOSE 92 112* 97  BUN 9 13 11   CREATININE 0.61 0.81 0.70  CALCIUM 9.8 10.3 10.3   GFR CrCl cannot be calculated (Unknown ideal weight.). Liver Function Tests:  Recent Labs Lab 10/05/15 1504 10/08/15 2059  AST 25 26  ALT 12* 14  ALKPHOS 51 48  BILITOT 0.9 1.0  PROT 7.5 6.8  ALBUMIN 4.3 4.1    Recent Labs Lab 10/05/15 1504 10/09/15 0228  LIPASE 25 35    Recent Labs Lab 10/08/15 2100  AMMONIA 16   Coagulation profile  Recent Labs Lab 10/09/15 0228  INR 1.16    CBC:  Recent Labs Lab 10/05/15 1504 10/08/15 2059 10/09/15 0229  WBC 3.7* 4.9 5.6  NEUTROABS 1.6*  --   --   HGB 12.2 11.9* 11.3*  HCT 36.4 35.4* 33.3*  MCV 91.2 89.6 89.0  PLT 218 214 214   Cardiac Enzymes:  Recent Labs Lab 10/09/15 0035  CKTOTAL 79   CBG:  Recent Labs Lab 10/08/15 2059  GLUCAP 110*   Lipid Profile:  Recent Labs  10/09/15 0229  CHOL 181  HDL 53   LDLCALC 119*  TRIG 45  CHOLHDL 3.4   Thyroid function studies:  Recent Labs  10/09/15 0229  TSH 1.546   Anemia work up:  Recent Labs  10/09/15 0228  VITAMINB12 2665*   Sepsis Labs:  Recent Labs Lab 10/05/15 1504 10/08/15 2059 10/09/15 0229  WBC 3.7* 4.9 5.6   Microbiology Recent Results (from the past 240 hour(s))  Urine culture     Status: None   Collection Time: 10/05/15  2:27 PM  Result Value Ref Range Status   Specimen Description URINE, CATHETERIZED  Final   Special Requests Normal  Final   Culture   Final    NO GROWTH 2 DAYS Performed at [facility]    Report Status 10/07/2015 FINAL  Final     Medications:   . acidophilus  1 capsule Oral Daily  . amLODipine  5 mg Oral Daily  . aspirin EC  81 mg Oral Daily  . calcium-vitamin D  1 tablet Oral Q breakfast  . enoxaparin (LOVENOX) injection  40 mg Subcutaneous Daily  . [START ON 10/10/2015] estradiol  0.025 mg Transdermal Weekly  . losartan  100 mg Oral Daily  . nystatin-triamcinolone  1  application Topical BID  . pantoprazole  40 mg Oral q morning - 10a  . propranolol  40 mg Oral TID  . simvastatin  40 mg Oral q morning - 10a  . sodium chloride flush  3 mL Intravenous Q12H  . vitamin B-12  1,000 mcg Oral Daily  . vitamin C  500 mg Oral Daily  . vitamin E  200 Units Oral Daily   Continuous Infusions: . sodium chloride 75 mL/hr at 10/09/15 0114  . levofloxacin (LEVAQUIN) IV Stopped (10/09/15 [id number])    Time spent: 25 minutes.   LOS: 0 days   RAMA,CHRISTINA  [facility] Pager [phone]. If unable to reach me by pager, please call my cell phone at [phone].  *Please refer to [email or web address], password [email or web address] to get updated schedule on who will round on this patient, as hospitalists switch teams weekly. If 7PM-7AM, please contact night-coverage at [email or web address], password [email or web address] for any overnight needs.  10/09/2015, 8:23 AM

## 2015-10-09 NOTE — Progress Notes (Signed)
Received from ED 82 yo female Skin WDI A&Ox1

## 2015-10-09 NOTE — ED Notes (Signed)
Returns from MRI placed back on monitoring

## 2015-10-09 NOTE — ED Notes (Signed)
Birdie Hopes (son) [phone], [phone]. Manuela Schwartz (daughter-in-law) [phone]

## 2015-10-09 NOTE — ED Notes (Signed)
Performed in/out cath on pt with RN. Urine sent for testing.

## 2015-10-09 NOTE — H&P (Addendum)
[facility] History and Physical  Kathryn [name] [id number] DOB: 05-Jun-1931 DOA: 10/08/2015  Referring physician: ED physician PCP: Kathryn Coma, MD  Specialists:   Chief Complaint: Altered mental status and hallucination  HPI: Kathryn [name] is a 82 y.o. female with PMH of hypertension, hyperlipidemia, GERD, hypothyroidism, depression, anxiety, TIA, stroke, recurrent UTI, chronic back pain, chronic abdominal pain, who presents with altered mental status and hallucination.  Per patient's son, patient has been confused and disorientation in past several days. She also has visual hallucination. Per her son, pt was found laying on the ground today by [employer] and she could not get up. Patient states that she does not remember the fall but does remember not being able to get up. Per her son, she has leg weakness and foot numbness. Her son is not very sure if her leg weakness and foot numbness are unilateral or bilateral, though he thinks more likely bilateral. Patient son reports that patient has chronic abdominal pain and abdominal spasm which has not changed significantly. No diarrhea. Patient still has bilateral lower abdominal pain today. Pt was seen on 10/05/15 for abdominal pain at [facility]. Per EDP's note, pt has history of chronic abdominal pain in with extensive workup including CT scans, ultrasound, endoscopy which were unrevealing. Patient reports that she was treated for UTI for 2 weeks, and had negative urinalysis when she was seen in the [facility] per her son. She reports that she has difficulty urinating some times.  In ED, patient was found to have WBC 4.9, temperature normal, electrolytes renal function okay, negative chest x-ray, negative CT head for acute abnormalities, ammonium level 16, pending urinalysis. Patient is admitted to inpatient for postreduction treatment.  EKG: Independently reviewed. QTC 465, PAC, diffused T-wave flattening.  Where does  patient live?   At home  Can patient participate in ADLs?  None  Review of Systems: couldn't be reviewed accurately due to altered mental status Allergy:  Allergies  Allergen Reactions  . Augmentin [Amoxicillin-Pot Clavulanate] Swelling and Diarrhea    Throat   . Citalopram Swelling    Eyes ears and throat swelling Throat and eyes   . Codeine Anaphylaxis and Shortness Of Breath  . Fish-Derived Products Anaphylaxis  . Hyoscyamine Anaphylaxis and Swelling  . Ibuprofen Swelling    Throat and eyes   . Latex Swelling and Rash    Swelling to troat  . Lipitor [Atorvastatin] Swelling    Muscle aches throat  . Septra [Bactrim] Anaphylaxis  . Shellfish Allergy Anaphylaxis  . Miconazole Rash  . Hydrocodone-Acetaminophen     Stroke  . Keflet [Cephalexin] Diarrhea    Upset stomach  . Meloxicam Diarrhea and Nausea And Vomiting  . Sulfa Antibiotics     Rash and also throat was tight  . Verapamil     Tachycardia and flushing  . Vicodin [Hydrocodone-Acetaminophen] Other (See Comments)    stroke  . Zoloft [Sertraline Hcl] Swelling    Red spots all over face, swelling of tongue and legs.   . Ciprofloxacin Nausea And Vomiting    Other reaction(s): Abdominal Pain  . Depakene [Valproate Sodium] Hives    All over body   . Isoptin Sr [Verapamil Hcl Er] Cough  . Lisinopril Cough    Fatigue and cough  . Telmisartan-Hctz Cough    Past Medical History  Diagnosis Date  . Depression   . Hypertension   . High cholesterol   . Rectal prolapse   . Hx of adenomatous  colonic polyps   . Internal hemorrhoids   . Hypothyroidism   . Fecal incontinence   . TIA (transient ischemic attack)   . Stroke ([place]) , sept 1, 2010sept 12, 2011    x 2  . History of frequent urinary tract infections     recent  . Migraine   . Fatty liver 03/20/13    Past Surgical History  Procedure Laterality Date  . Colon surgery  2010, for prolapsed organs after hystecrtomy surgery  . Appendectomy  2008  .  Abdominal hysterectomy  2010  . Cholecystectomy    . Lumbar laminectomy/decompression microdiscectomy N/A 01/07/2013    Procedure: LUMBAR LAMINECTOMY CENTRAL DECOMPRESSION L4-L5, BILATERAL FORAMENOTOMY L4,L5    ;  Surgeon: Tobi Bastos, MD;  Location: [facility]S;  Service: Orthopedics;  Laterality: N/A;    Social History:  reports that she has never smoked. She has never used smokeless tobacco. She reports that she does not drink alcohol or use illicit drugs.  Family History:  Family History  Problem Relation Age of Onset  . Stroke Father   . Migraines Father   . CVA Father   . Heart attack Father   . Hypertension Maternal Aunt     x3  . Colon cancer Neg Hx   . CVA Mother      Prior to Admission medications   Medication Sig Start Date End Date Taking? Authorizing Provider  amLODipine (NORVASC) 2.5 MG tablet Take 5 mg by mouth daily.    Yes Historical Provider, MD  aspirin EC 81 MG tablet Take 81 mg by mouth daily.   Yes Historical Provider, MD  Calcium Carbonate-Vitamin D (CALCIUM + D PO) Take 1 tablet by mouth daily. Reported on 10/08/2015   Yes Historical Provider, MD  ciprofloxacin (CIPRO) 500 MG tablet Take 500 mg by mouth 2 (two) times daily.   Yes Historical Provider, MD  clonazePAM (KLONOPIN) 1 MG tablet Take 0.5-1 mg by mouth at bedtime as needed for anxiety. Reported on 10/05/2015 01/23/12  Yes Lafayette Dragon, MD  fexofenadine (ALLEGRA) 180 MG tablet Take 180 mg by mouth daily.   Yes Historical Provider, MD  glycopyrrolate ([name]) 2 MG tablet TAKE ONE TABLET BY MOUTH TWICE DAILY 03/27/15  Yes Lafayette Dragon, MD  losartan (COZAAR) 100 MG tablet Take 100 mg by mouth daily.   Yes Historical Provider, MD  Naproxen Sod-Diphenhydramine (ALEVE PM) 220-25 MG TABS Take 1 tablet by mouth at bedtime.   Yes Historical Provider, MD  nitrofurantoin, macrocrystal-monohydrate, (MACROBID) 100 MG capsule Take 100 mg by mouth 2 (two) times daily.   Yes Historical Provider, MD  PRESCRIPTION MEDICATION  Pt gets allergy shots once weekly. Dr Donneta Romberg.   Yes Historical Provider, MD  Probiotic Product (PROBIOTIC DAILY PO) Take 1 tablet by mouth daily.   Yes Historical Provider, MD  vitamin B-12 (CYANOCOBALAMIN) 100 MCG tablet Take 100 mcg by mouth daily.   Yes Historical Provider, MD  vitamin C (ASCORBIC ACID) 500 MG tablet Take 500 mg by mouth daily.   Yes Historical Provider, MD  vitamin E 200 UNIT capsule Take 200 Units by mouth daily.    Yes Historical Provider, MD  diazepam (VALIUM) 2 MG tablet Take 2 mg by mouth every 6 (six) hours as needed for anxiety. Reported on 10/05/2015    Historical Provider, MD  doxycycline (VIBRAMYCIN) 100 MG capsule Take 1 capsule (100 mg total) by mouth 2 (two) times daily. Patient not taking: Reported on 10/08/2015 07/31/15   Shawn  C Joy, PA-C  estradiol (VIVELLE-DOT) 0.0375 MG/24HR Place 0.5 patches onto the skin 2 (two) times a week. Sunday and Wed    Historical Provider, MD  fluticasone (FLONASE) 50 MCG/ACT nasal spray Place 2 sprays into the nose daily as needed for allergies.     Historical Provider, MD  nystatin-triamcinolone (MYCOLOG II) cream Apply 1 application topically 2 (two) times daily.    Historical Provider, MD  pantoprazole (PROTONIX) 40 MG tablet Take 1 tablet (40 mg total) by mouth every morning. 04/18/13   Terance Ice, MD  propranolol (INDERAL) 40 MG tablet Take 40 mg by mouth 3 (three) times daily.    Historical Provider, MD  simvastatin (ZOCOR) 40 MG tablet Take 40 mg by mouth every morning.     Historical Provider, MD  traMADol-acetaminophen (ULTRACET) 37.5-325 MG per tablet Take 1 tablet by mouth every 8 (eight) hours as needed. Patient not taking: Reported on 10/08/2015 06/16/14   Delice Bison Ward, DO    Physical Exam: Filed Vitals:   10/08/15 2245 10/08/15 2300 10/08/15 2315 10/09/15 0102  BP: 169/83 143/66 176/79 152/79  Pulse: 78 75 85 89  Resp:    17  SpO2: 99% 98% 98% 96%   General: Not in acute distress HEENT:       Eyes: PERRL,  EOMI, no scleral icterus.       ENT: No discharge from the ears and nose, no pharynx injection, no tonsillar enlargement.        Neck: No JVD, no bruit, no mass felt. Heme: No neck lymph node enlargement. Cardiac: S1/S2, RRR, No murmurs, No gallops or rubs. Pulm: No rales, wheezing, rhonchi or rubs. Abd: Soft, nondistended, mild tenderness over both lower sides of abdomen, no rebound pain, no organomegaly, BS present. Ext: No pitting leg edema bilaterally. 2+DP/PT pulse bilaterally. Musculoskeletal: No joint deformities, No joint redness or warmth, no limitation of ROM in spin. Skin: No rashes.  Neuro: Alert, oriented X3, cranial nerves II-XII grossly intact, muscle strength 5/5 in all extremities, sensation to light touch intact. Knee reflex 1+ bilaterally. Negative Babinski's sign. Normal finger to nose test. Psych: Patient is not psychotic, no suicidal or hemocidal ideation.  Labs on Admission:  Basic Metabolic Panel:  Recent Labs Lab 10/05/15 1504 10/08/15 2059  NA 133* 132*  K 3.7 3.8  CL 96* 93*  CO2 27 24  GLUCOSE 92 112*  BUN 9 13  CREATININE 0.61 0.81  CALCIUM 9.8 10.3   Liver Function Tests:  Recent Labs Lab 10/05/15 1504 10/08/15 2059  AST 25 26  ALT 12* 14  ALKPHOS 51 48  BILITOT 0.9 1.0  PROT 7.5 6.8  ALBUMIN 4.3 4.1    Recent Labs Lab 10/05/15 1504  LIPASE 25    Recent Labs Lab 10/08/15 2100  AMMONIA 16   CBC:  Recent Labs Lab 10/05/15 1504 10/08/15 2059  WBC 3.7* 4.9  NEUTROABS 1.6*  --   HGB 12.2 11.9*  HCT 36.4 35.4*  MCV 91.2 89.6  PLT 218 214   Cardiac Enzymes: No results for input(s): CKTOTAL, CKMB, CKMBINDEX, TROPONINI in the last 168 hours.  BNP (last 3 results) No results for input(s): BNP in the last 8760 hours.  ProBNP (last 3 results) No results for input(s): PROBNP in the last 8760 hours.  CBG:  Recent Labs Lab 10/08/15 2059  GLUCAP 110*    Radiological Exams on Admission: Dg Chest 2 View  10/08/2015   CLINICAL DATA:  Chronic generalized chest and abdominal pain. Delirium.  Initial encounter. EXAM: CHEST  2 VIEW COMPARISON:  Chest radiograph performed 06/16/2014 FINDINGS: The lungs are well-aerated and clear. There is no evidence of focal opacification, pleural effusion or pneumothorax. The heart is normal in size; the mediastinal contour is within normal limits. No acute osseous abnormalities are seen. A chronic compression deformity is noted at the lower thoracic spine. IMPRESSION: No acute cardiopulmonary process seen. Electronically Signed   By: Garald Balding M.D.   On: 10/08/2015 23:58   Ct Head Wo Contrast  10/08/2015  CLINICAL DATA:  Increased confusion. Altered mental status. Fall. Patient amnestic for the event. EXAM: CT HEAD WITHOUT CONTRAST TECHNIQUE: Contiguous axial images were obtained from the base of the skull through the vertex without intravenous contrast. COMPARISON:  MR brain 05/22/2013.  CT head 05/03/2012. FINDINGS: No evidence for acute infarction, hemorrhage, mass lesion, hydrocephalus, or extra-axial fluid. Generalized atrophy. Chronic microvascular ischemic change. Scattered areas of chronic lacunar infarction. No CT signs of proximal vascular thrombosis. Advanced vascular calcification in the carotid and vertebral arteries. Calvarium is intact. No sinus or mastoid air fluid level. Similar appearance to priors. IMPRESSION: Atrophy and small vessel disease. Areas of chronic ischemia. No skull fracture or intracranial hemorrhage. Electronically Signed   By: Staci Righter M.D.   On: 10/08/2015 23:02    Assessment/Plan Principal Problem:   Acute encephalopathy Active Problems:   Essential hypertension   Hypothyroidism   Lower abdominal pain   Spinal stenosis, lumbar region, with neurogenic claudication   Generalized anxiety disorder   GERD (gastroesophageal reflux disease)   Recurrent falls   History of stroke   Polypharmacy   Acute encephalopathy: Etiology is not clear.  Most likely due to polypharmacy, particularly anticholinergic medications, [name] and and diphenhydramine. She is also on Clonopin and Valium. Another potential differential diagnosis is TIA/stroke given patient has leg weakness and foot numbness, will need to r/o this possibility.  -will admit to tele bed -Hold [name], diphenhydramine ( combined pill with naproxen), Valium, klonopine and Allegra -start prn Ativan for anxiety. -Frequent neuro checks -MRI of the brain to rule out stroke -check Vitamin B12 level -IV fluids: Normal saline 75 mL per hour -f/u CK level -fall precaution  Addendum: her UA is positive for UTI, which may have partially contributed to her AMS. She is allergic to many medications. I consulted pharmacist, who recommended IV Levaquin. Will f/u Ux and Bx  Falls: Likely due to the same etiology as above -pt/ot  Chronic abdominal pain: Patient had extensive negative workup in the past (as described in HPI). Per her son, patient's abdominal pain and abdominal spasm has not changed in nature. No acute abdomen signs on physical examination. I discussed with her son about the plan, who agreed with observation now.  -Observation closely -Hold [name] due to AMS -continue protonix -check lipase -when necessary Zofran for nausea -prn tylenol (patient is allergic to narcotic)  HTN: -continue amlodipine, losartan, propranolol -IV hydralazine when necessary  Hypothyroidism: Last TSH was 2.882 on 05/22/13. not on medications. -Check TSH  GERD: -Protonix  History of stroke: -on ASA and zocor  HLD: Last LDL was 1:15 on 05/03/10 -Continue home medications: Zocor -Check FLP   DVT ppx: SQ Lovenox  Code Status: Full code Family Communication:  Yes, patient's son at bed side Disposition Plan: Admit to inpatient   Date of Service 10/09/2015    Ivor Costa [facility] Pager [phone]  If 7PM-7AM, please contact night-coverage [email or web address] Password  [email or web address] 10/09/2015, 1:15 AM

## 2015-10-09 NOTE — ED Notes (Signed)
Son Oliviah Mcnew cell [phone] home [phone], Daughter in law [phone]

## 2015-10-09 NOTE — Progress Notes (Signed)
Pharmacy Antibiotic Note  Kathryn [name] is a 82 y.o. female admitted on 10/08/2015 with UTI.  Pharmacy has been consulted for Levaquin dosing.  Plan: -Levaquin 750 mg IV q48h -F/U urine culture for directed therapy  No data recorded.   Recent Labs Lab 10/05/15 1504 10/08/15 2059 10/09/15 0229  WBC 3.7* 4.9 5.6  CREATININE 0.61 0.81 0.70    CrCl cannot be calculated (Unknown ideal weight.).    Allergies  Allergen Reactions  . Augmentin [Amoxicillin-Pot Clavulanate] Swelling and Diarrhea    Throat   . Citalopram Swelling    Eyes ears and throat swelling Throat and eyes   . Codeine Anaphylaxis and Shortness Of Breath  . Fish-Derived Products Anaphylaxis  . Hyoscyamine Anaphylaxis and Swelling  . Ibuprofen Swelling    Throat and eyes   . Latex Swelling and Rash    Swelling to troat  . Lipitor [Atorvastatin] Swelling    Muscle aches throat  . Septra [Bactrim] Anaphylaxis  . Shellfish Allergy Anaphylaxis  . Miconazole Rash  . Hydrocodone-Acetaminophen     Stroke  . Keflet [Cephalexin] Diarrhea    Upset stomach  . Meloxicam Diarrhea and Nausea And Vomiting  . Sulfa Antibiotics     Rash and also throat was tight  . Verapamil     Tachycardia and flushing  . Vicodin [Hydrocodone-Acetaminophen] Other (See Comments)    stroke  . Zoloft [Sertraline Hcl] Swelling    Red spots all over face, swelling of tongue and legs.   . Ciprofloxacin Nausea And Vomiting    Other reaction(s): Abdominal Pain  . Depakene [Valproate Sodium] Hives    All over body   . Isoptin Sr [Verapamil Hcl Er] Cough  . Lisinopril Cough    Fatigue and cough  . Telmisartan-Hctz Cough    Narda Bonds 10/09/2015 3:59 AM

## 2015-10-09 NOTE — Evaluation (Signed)
Physical Therapy Evaluation Patient Details Name: Kathryn [name] MRN: [id number] DOB: 22-Sep-1930 Today's Date: 10/09/2015   History of Present Illness  Pt adm with acute encephalopathy and hallucinations. UTI positive. MRI negative. PMH - chronic abdominal pain, chronic back pain, HTN, recurrent UTI's, depression, anxiety  Clinical Impression  Pt admitted with above diagnosis and presents to PT with functional limitations due to deficits listed below (See PT problem list). Pt needs skilled PT to maximize independence and safety to allow discharge to SNF. Pt lives alone and at this time is unable to manage basic mobility.     Follow Up Recommendations SNF    Equipment Recommendations  Other (comment) (To be determined)    Recommendations for Other Services       Precautions / Restrictions Precautions Precautions: Fall Restrictions Weight Bearing Restrictions: No      Mobility  Bed Mobility Overal bed mobility: Needs Assistance Bed Mobility: Supine to Sit;Sit to Supine     Supine to sit: Mod assist Sit to supine: +2 for physical assistance;Mod assist   General bed mobility comments: Assist to bring legs off and elevate trunk and scoot hips to EOB. Assist to bring legs back up and lower trunk returning to supine.  Transfers Overall transfer level: Needs assistance Equipment used: 4-wheeled walker Transfers: Sit to/from Stand Sit to Stand: +2 physical assistance;Mod assist         General transfer comment: Assist to bring hips up and for balance. Much verbal encouragement to get pt to attempt.  Ambulation/Gait Ambulation/Gait assistance: +2 physical assistance;Mod assist;Min assist Ambulation Distance (Feet): 20 Feet (x 2) Assistive device: 4-wheeled walker Gait Pattern/deviations: Step-through pattern;Decreased step length - right;Decreased step length - left;Shuffle;Trunk flexed Gait velocity: decr Gait velocity interpretation: <1.8 ft/sec, indicative of risk for  recurrent falls General Gait Details: Assist for balance and support. Pt  limited due to confusion and anxiety.  Stairs            Wheelchair Mobility    Modified Rankin (Stroke Patients Only)       Balance Overall balance assessment: Needs assistance Sitting-balance support: Feet supported;Bilateral upper extremity supported Sitting balance-Leahy Scale: Poor Sitting balance - Comments: Sat EOB with min to mod A due to posterior lean primarily due to anxiety.   Standing balance support: Bilateral upper extremity supported Standing balance-Leahy Scale: Poor Standing balance comment: Walker and min to mod a for static standing                             Pertinent Vitals/Pain Pain Assessment: Faces Faces Pain Scale: Hurts a little bit Pain Location: abdomen Pain Descriptors / Indicators: Grimacing Pain Intervention(s): Limited activity within patient's tolerance    Home Living Family/patient expects to be discharged to:: Private residence Living Arrangements: Alone Available Help at Discharge: Family;Available PRN/intermittently Type of Home: House Home Access: Level entry     Home Layout: One level   Additional Comments: Per previous encounter    Prior Function Level of Independence: Needs assistance   Gait / Transfers Assistance Needed: Per pt amb modified independent. Reports she doesn't use walker because it makes her fall.           Hand Dominance   Dominant Hand: Right    Extremity/Trunk Assessment   Upper Extremity Assessment: Defer to OT evaluation           Lower Extremity Assessment: Generalized weakness  Communication   Communication: No difficulties  Cognition Arousal/Alertness: Awake/alert Behavior During Therapy: Anxious Overall Cognitive Status: Impaired/Different from baseline Area of Impairment: Orientation;Following commands;Safety/judgement;Problem solving;Memory;Attention Orientation Level:  Disoriented to;Place;Time;Situation Current Attention Level: Sustained Memory: Decreased short-term memory Following Commands: Follows one step commands inconsistently Safety/Judgement: Decreased awareness of safety   Problem Solving: Slow processing;Decreased initiation;Requires verbal cues;Requires tactile cues General Comments: Difficult to keep pt on task. Perseverating about Ambien. She also thought she had hurt somebody.    General Comments      Exercises        Assessment/Plan    PT Assessment Patient needs continued PT services  PT Diagnosis Difficulty walking;Generalized weakness;Altered mental status   PT Problem List Decreased strength;Decreased activity tolerance;Decreased balance;Decreased mobility;Decreased cognition;Decreased knowledge of use of DME  PT Treatment Interventions DME instruction;Gait training;Functional mobility training;Therapeutic activities;Therapeutic exercise;Balance training;Cognitive remediation;Patient/family education   PT Goals (Current goals can be found in the Care Plan section) Acute Rehab PT Goals Patient Stated Goal: Not stated PT Goal Formulation: Patient unable to participate in goal setting Time For Goal Achievement: 10/23/15 Potential to Achieve Goals: Good    Frequency Min 3X/week   Barriers to discharge Decreased caregiver support Lives alone    Co-evaluation               End of Session Equipment Utilized During Treatment: Gait belt Activity Tolerance: Patient limited by fatigue;Other (comment) (limited by confusion) Patient left: in bed;with call bell/phone within reach;with nursing/sitter in room Nurse Communication: Mobility status (Nurse assisted with mobility)         Time: [id number] PT Time Calculation (min) (ACUTE ONLY): 14 min   Charges:   PT Evaluation $PT Eval Moderate Complexity: 1 Procedure PT Treatments $Gait Training: 8-22 mins   PT G Codes:        Taelor Waymire 11-01-2015, 12:44 PM [facility] PT [phone]

## 2015-10-09 NOTE — ED Notes (Signed)
Report attempted 

## 2015-10-10 ENCOUNTER — Inpatient Hospital Stay (HOSPITAL_COMMUNITY): Payer: Medicare Other

## 2015-10-10 DIAGNOSIS — G9340 Encephalopathy, unspecified: Secondary | ICD-10-CM

## 2015-10-10 DIAGNOSIS — G40211 Localization-related (focal) (partial) symptomatic epilepsy and epileptic syndromes with complex partial seizures, intractable, with status epilepticus: Secondary | ICD-10-CM | POA: Diagnosis not present

## 2015-10-10 LAB — URINALYSIS, ROUTINE W REFLEX MICROSCOPIC
Bilirubin Urine: NEGATIVE
GLUCOSE, UA: NEGATIVE mg/dL
HGB URINE DIPSTICK: NEGATIVE
KETONES UR: NEGATIVE mg/dL
Nitrite: NEGATIVE
PROTEIN: NEGATIVE mg/dL
Specific Gravity, Urine: 1.009 (ref 1.005–1.030)
pH: 7.5 (ref 5.0–8.0)

## 2015-10-10 LAB — COMPREHENSIVE METABOLIC PANEL
ALT: 14 U/L (ref 14–54)
ANION GAP: 9 (ref 5–15)
AST: 25 U/L (ref 15–41)
Albumin: 3.6 g/dL (ref 3.5–5.0)
Alkaline Phosphatase: 47 U/L (ref 38–126)
BILIRUBIN TOTAL: 1.1 mg/dL (ref 0.3–1.2)
BUN: 7 mg/dL (ref 6–20)
CHLORIDE: 101 mmol/L (ref 101–111)
CO2: 26 mmol/L (ref 22–32)
Calcium: 9.8 mg/dL (ref 8.9–10.3)
Creatinine, Ser: 0.72 mg/dL (ref 0.44–1.00)
Glucose, Bld: 98 mg/dL (ref 65–99)
POTASSIUM: 3.3 mmol/L — AB (ref 3.5–5.1)
Sodium: 136 mmol/L (ref 135–145)
TOTAL PROTEIN: 6.6 g/dL (ref 6.5–8.1)

## 2015-10-10 LAB — CBC
HEMATOCRIT: 35.6 % — AB (ref 36.0–46.0)
HEMOGLOBIN: 12 g/dL (ref 12.0–15.0)
MCH: 30.3 pg (ref 26.0–34.0)
MCHC: 33.7 g/dL (ref 30.0–36.0)
MCV: 89.9 fL (ref 78.0–100.0)
Platelets: 215 10*3/uL (ref 150–400)
RBC: 3.96 MIL/uL (ref 3.87–5.11)
RDW: 12.5 % (ref 11.5–15.5)
WBC: 7.3 10*3/uL (ref 4.0–10.5)

## 2015-10-10 LAB — URINE MICROSCOPIC-ADD ON

## 2015-10-10 LAB — BLOOD GAS, ARTERIAL
ACID-BASE DEFICIT: 0.7 mmol/L (ref 0.0–2.0)
BICARBONATE: 22.5 meq/L (ref 20.0–24.0)
Drawn by: 270221
FIO2: 0.21
O2 SAT: 95.6 %
PATIENT TEMPERATURE: 98.6
PO2 ART: 76.8 mmHg — AB (ref 80.0–100.0)
TCO2: 23.5 mmol/L (ref 0–100)
pCO2 arterial: 31.7 mmHg — ABNORMAL LOW (ref 35.0–45.0)
pH, Arterial: 7.466 — ABNORMAL HIGH (ref 7.350–7.450)

## 2015-10-10 LAB — MAGNESIUM: MAGNESIUM: 1.4 mg/dL — AB (ref 1.7–2.4)

## 2015-10-10 LAB — GLUCOSE, CAPILLARY: GLUCOSE-CAPILLARY: 90 mg/dL (ref 65–99)

## 2015-10-10 LAB — URINE CULTURE

## 2015-10-10 MED ORDER — SODIUM CHLORIDE 0.9 % IV SOLN
500.0000 mg | Freq: Two times a day (BID) | INTRAVENOUS | Status: DC
  Administered 2015-10-11 (×3): 500 mg via INTRAVENOUS
  Filled 2015-10-10 (×8): qty 5

## 2015-10-10 MED ORDER — LEVETIRACETAM 500 MG/5ML IV SOLN
1000.0000 mg | Freq: Once | INTRAVENOUS | Status: AC
  Administered 2015-10-10: 1000 mg via INTRAVENOUS
  Filled 2015-10-10: qty 10

## 2015-10-10 MED ORDER — MAGNESIUM SULFATE 2 GM/50ML IV SOLN
2.0000 g | Freq: Once | INTRAVENOUS | Status: AC
  Administered 2015-10-10: 2 g via INTRAVENOUS
  Filled 2015-10-10: qty 50

## 2015-10-10 MED ORDER — LORAZEPAM 2 MG/ML IJ SOLN
2.0000 mg | Freq: Once | INTRAMUSCULAR | Status: AC
  Administered 2015-10-10: 2 mg via INTRAVENOUS

## 2015-10-10 MED ORDER — TRAMADOL HCL 50 MG PO TABS
50.0000 mg | ORAL_TABLET | Freq: Once | ORAL | Status: DC
  Filled 2015-10-10: qty 1

## 2015-10-10 NOTE — Progress Notes (Signed)
Attempted to cal report to [facility] nurse unable

## 2015-10-10 NOTE — Procedures (Signed)
ELECTROENCEPHALOGRAM REPORT  Date of Study: 10/10/2015  Patient's Name: Kathryn [name] MRN: [id number] Date of Birth: 06/28/1931  Referring Provider: Dr. Damien Fusi  Clinical History: This is an 82 year old woman with altered mental status and hallucinations.  Medications: amLODipine (NORVASC) tablet 5 mg aspirin EC tablet 81 mg calcium-vitamin D (OSCAL WITH D) 500-200 MG-UNIT per tablet 1 tablet enoxaparin (LOVENOX) injection 40 mg estradiol (CLIMARA - Dosed in mg/24 hr) patch 0.025 mg fluticasone (FLONASE) 50 MCG/ACT nasal spray 2 spray hydrALAZINE (APRESOLINE) injection 5 mg levofloxacin (LEVAQUIN) IVPB 750 mg losartan (COZAAR) tablet 100 mg pantoprazole (PROTONIX) EC tablet 40 mg propranolol (INDERAL) tablet 40 mg simvastatin (ZOCOR) tablet 40 mg traMADol (ULTRAM) tablet 50 mg vitamin B-12 (CYANOCOBALAMIN) tablet 1,000 mcg vitamin C (ASCORBIC ACID) tablet 500 mg vitamin E capsule 200 Units  Technical Summary: A multichannel digital EEG recording measured by the international 10-20 system with electrodes applied with paste and impedances below 5000 ohms performed in our laboratory with EKG monitoring in an awake and asleep patient.  Hyperventilation and photic stimulation were not performed.  The digital EEG was referentially recorded, reformatted, and digitally filtered in a variety of bipolar and referential montages for optimal display.    Description: The patient is awake and asleep during the recording. At the beginning of the recording, rhythmic 1-2 Hz broad sharp waves are seen over the left mid-temporal region with focal 1-2 Hz delta slowing over the left hemisphere. There were 4 clinicoelectrographic seizures captured with evolution in frequency and amplitude over the left temporal region lasting 100-180 seconds, with the patient noted to be unresponsive. She is given IV Ativan 25 minutes into the recording, with resolution of rhythmic activity. Sleep architecture with  sleep spindles are noted, with polymorphic delta slowing seen over the left hemisphere.   EKG lead was unremarkable.  Impression: This awake and asleep EEG is abnormal due to the presence of: 1. Four focal seizures captured arising from the left temporal region. Clinically the patient is noted to be unresponsive to commands. Seizures resolve with Ativan. 2. 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 Focal slowing in the left hemisphere indicates focal cerebral dysfunction in this region suggestive of underlying structural or physiologic abnormality, or post-ictal change from recurrent seizures.     Ellouise Newer, M.D.

## 2015-10-10 NOTE — Progress Notes (Signed)
RRT nurse w/ pt in ED S/P SZ in EEG ativan given by same.Head CT completed.Skin WDI . Dr Renne Crigler in room now to speake to son  Kathryn [name]

## 2015-10-10 NOTE — Significant Event (Signed)
Rapid Response Event Note  Overview: Time Called: 1119 Arrival Time: 1125    Initial Focused Assessment: Patient having an EEG done.  Dr Janann Colonel called because patient having focal seizure.   Patient unresponsive, right gaze deviation.   Interventions: 2mg  Ativan given IV Patient continues to have right gaze deviation and tremulous.  Per EEG staff seizure activity has ceased. Transported patient to radiology for head CT then returned to 5w Keppra given IV Plan transfer to SDU for closer monitoring Dr Patsy Lager at bedside, updated son on patient status. Rn to call if assistance needed.   Event Summary: Name of Physician Notified: Gherghe at 1125  Name of Consulting Physician Notified: Dr Janann Colonel at bedside at          Raliegh Ip

## 2015-10-10 NOTE — Clinical Social Work Note (Signed)
Clinical Social Work Assessment  Patient Details  Name: Kathryn [name] MRN: [id number] Date of Birth: 25-Jun-1931  Date of referral:  10/10/15               Reason for consult:  Facility Placement                Permission sought to share information with:  Facility Sport and exercise psychologist, Family Supports Permission granted to share information::  Yes, Verbal Permission Granted  Name::     [place]::  [facility] SNFs  Relationship::  Son  Contact Information:  [phone]  Housing/Transportation Living arrangements for the past 2 months:  [place] of Information:  Adult Children Patient Interpreter Needed:  None Criminal Activity/Legal Involvement Pertinent to Current Situation/Hospitalization:  No - Comment as needed Significant Relationships:  Adult Children Lives with:  Self Do you feel safe going back to the place where you live?  No Need for family participation in patient care:  Yes (Comment)  Care giving concerns:  CSW received referral for possible SNF placement at time of discharge. CSW met with patient and patient's son regarding PT recommendation of SNF placement at time of discharge. Patient is disoriented. Per patient's son, patient lives alone and is currently unable to care for herself given patient's current physical needs and fall risk. Patient's son reports that he has been having a hard time convincing the patient to consider an ALF for safety reasons. Patient is currently agreeable to SNF level care. Patient's son expressed understanding of PT recommendation and is agreeable to SNF placement at time of discharge. CSW to continue to follow and assist with discharge planning needs.   Social Worker assessment / plan:  CSW spoke with patient and patient's son concerning possibility of rehab at [facility] before returning home.  Employment status:  Retired Forensic scientist:  Medicare PT Recommendations:  Not assessed at this time Information /  Referral to community resources:  [place]  Patient/Family's Response to care: Patient and patient's son recognize need for rehab before returning home and are agreeable to a SNF. Patient reported preference for [facility].  Patient/Family's Understanding of and Emotional Response to Diagnosis, Current Treatment, and Prognosis:  Patient is realistic regarding therapy needs. No questions/concerns about plan or treatment.    Emotional Assessment Appearance:  Appears stated age Attitude/Demeanor/Rapport:   (Appropriate) Affect (typically observed):  Accepting Orientation:  Oriented to Self Alcohol / Substance use:  Not Applicable Psych involvement (Current and /or in the community):  No (Comment)  Discharge Needs  Concerns to be addressed:  Care Coordination Readmission within the last 30 days:  No Current discharge risk:  None Barriers to Discharge:  Continued Medical Work up   [employer], [place] 10/10/2015, 9:26 AM

## 2015-10-10 NOTE — Progress Notes (Signed)
Transferred to 2C11 via bed

## 2015-10-10 NOTE — Progress Notes (Signed)
UR COMPLETED  

## 2015-10-10 NOTE — Consult Note (Signed)
Consult Reason for Consult: seizure Referring Physician: Dr Cruzita Lederer  CC: altered mental status, seizures  HPI: Kathryn [name] is an 82 y.o. female hx of HTN, TIA, stroke, polypharmacy admitted 2/07 with altered mental status and visual hallucinations. Mental status did not improve so EEG was ordered. Neurology called by EEG tech who noticed clinical and electrographic seizure activity.   MRI brain imaging reviewed, shows multiple old infarcts with no acute process noted.   Past Medical History  Diagnosis Date  . Depression   . Hypertension   . High cholesterol   . Rectal prolapse   . Hx of adenomatous colonic polyps   . Internal hemorrhoids   . Hypothyroidism   . Fecal incontinence   . TIA (transient ischemic attack)   . History of frequent urinary tract infections     recent  . Migraine   . Fatty liver 03/20/13  . Generalized ischemic cerebrovascular disease 10/09/2015  . Stroke [facility]) Sept 1, 2010; Sept 12, 2011    Past Surgical History  Procedure Laterality Date  . Colon surgery  2010, for prolapsed organs after hystecrtomy surgery  . Appendectomy  2008  . Cholecystectomy    . Lumbar laminectomy/decompression microdiscectomy N/A 01/07/2013    Procedure: LUMBAR LAMINECTOMY CENTRAL DECOMPRESSION L4-L5, BILATERAL FORAMENOTOMY L4,L5    ;  Surgeon: Tobi Bastos, MD;  Location: [facility] ORS;  Service: Orthopedics;  Laterality: N/A;  . Back surgery    . Hemorrhoid surgery  09/2008    Archie Endo 12/31/2010  . Abdominal hysterectomy  05/2007    Archie Endo 01/02/2011    Family History  Problem Relation Age of Onset  . Stroke Father   . Migraines Father   . CVA Father   . Heart attack Father   . Hypertension Maternal Aunt     x3  . Colon cancer Neg Hx   . CVA Mother     Social History:  reports that she has never smoked. She has never used smokeless tobacco. She reports that she does not drink alcohol or use illicit drugs.  Allergies  Allergen Reactions  . Augmentin [Amoxicillin-Pot  Clavulanate] Swelling and Diarrhea    Throat   . Citalopram Swelling    Eyes ears and throat swelling Throat and eyes   . Codeine Anaphylaxis and Shortness Of Breath  . Fish-Derived Products Anaphylaxis  . Hyoscyamine Anaphylaxis and Swelling  . Ibuprofen Swelling    Throat and eyes   . Latex Swelling and Rash    Swelling to troat  . Lipitor [Atorvastatin] Swelling    Muscle aches throat  . Septra [Bactrim] Anaphylaxis  . Shellfish Allergy Anaphylaxis  . Miconazole Rash  . Hydrocodone-Acetaminophen     Stroke  . Keflet [Cephalexin] Diarrhea    Upset stomach  . Meloxicam Diarrhea and Nausea And Vomiting  . Sulfa Antibiotics     Rash and also throat was tight  . Verapamil     Tachycardia and flushing  . Vicodin [Hydrocodone-Acetaminophen] Other (See Comments)    stroke  . Zoloft [Sertraline Hcl] Swelling    Red spots all over face, swelling of tongue and legs.   . Ciprofloxacin Nausea And Vomiting    Other reaction(s): Abdominal Pain  . Depakene [Valproate Sodium] Hives    All over body   . Isoptin Sr [Verapamil Hcl Er] Cough  . Lisinopril Cough    Fatigue and cough  . Telmisartan-Hctz Cough    Medications: I have reviewed the patient's current medications.   ROS: Out of  a complete 14 system review, the patient complains of only the following symptoms, and all other reviewed systems are negative. +altered mental status, seizures  Physical Examination: Filed Vitals:   10/10/15 0719 10/10/15 0841  BP: 152/53 102/61  Pulse: 61   Temp:    Resp: 20    Physical Exam  Constitutional: He appears well-developed and well-nourished.  Psych: Affect appropriate to situation Eyes: No scleral injection HENT: No OP obstrucion Head: Normocephalic.  Cardiovascular: Normal rate and regular rhythm.  Respiratory: Effort normal and breath sounds normal.  GI: Soft. Bowel sounds are normal. No distension. There is no tenderness.  Skin: WDI  Neurologic  Examination Mental Status: Eyes open, non-verbal, not following commands Cranial Nerves: II: optic discs not visualized, no blink to threat, pupils equal, round, reactive to light III,IV, VI: ptosis not present, gaze deviation to the right V,VII: face symmetric VIII: unable to test IX,X: gag reflex present XI: unable to test XII: unable to test Motor: Increased tone with rhythmic contractions of RUE and RLE No spontaneous movement of left side Sensory: no withdrawal to noxious stimuli Deep Tendon Reflexes: 2+ and symmetric throughout Plantars: Right: downgoing   Left: downgoing Cerebellar: Unable to test Gait: unable to test  Laboratory Studies:   Basic Metabolic Panel:  Recent Labs Lab 10/05/15 1504 10/08/15 2059 10/09/15 0229  NA 133* 132* 134*  K 3.7 3.8 3.7  CL 96* 93* 97*  CO2 27 24 23   GLUCOSE 92 112* 97  BUN 9 13 11   CREATININE 0.61 0.81 0.70  CALCIUM 9.8 10.3 10.3    Liver Function Tests:  Recent Labs Lab 10/05/15 1504 10/08/15 2059  AST 25 26  ALT 12* 14  ALKPHOS 51 48  BILITOT 0.9 1.0  PROT 7.5 6.8  ALBUMIN 4.3 4.1    Recent Labs Lab 10/05/15 1504 10/09/15 0228  LIPASE 25 35    Recent Labs Lab 10/08/15 2100  AMMONIA 16    CBC:  Recent Labs Lab 10/05/15 1504 10/08/15 2059 10/09/15 0229  WBC 3.7* 4.9 5.6  NEUTROABS 1.6*  --   --   HGB 12.2 11.9* 11.3*  HCT 36.4 35.4* 33.3*  MCV 91.2 89.6 89.0  PLT 218 214 214    Cardiac Enzymes:  Recent Labs Lab 10/09/15 0035  CKTOTAL 79    BNP: Invalid input(s): POCBNP  CBG:  Recent Labs Lab 10/08/15 2059 10/09/15 0854 10/10/15 0739  GLUCAP 110* 100* 90    Microbiology: Results for orders placed or performed during the hospital encounter of 10/05/15  Urine culture     Status: None   Collection Time: 10/05/15  2:27 PM  Result Value Ref Range Status   Specimen Description URINE, CATHETERIZED  Final   Special Requests Normal  Final   Culture   Final    NO GROWTH 2  DAYS Performed at [facility]    Report Status 10/07/2015 FINAL  Final    Coagulation Studies:  Recent Labs  10/09/15 0228  LABPROT 14.9  INR 1.16    Urinalysis:  Recent Labs Lab 10/05/15 1427 10/09/15 0143  COLORURINE YELLOW YELLOW  LABSPEC 1.004* 1.009  PHURINE 7.5 6.5  GLUCOSEU NEGATIVE NEGATIVE  HGBUR NEGATIVE NEGATIVE  BILIRUBINUR NEGATIVE NEGATIVE  KETONESUR NEGATIVE 15*  PROTEINUR NEGATIVE NEGATIVE  NITRITE NEGATIVE POSITIVE*  LEUKOCYTESUR NEGATIVE SMALL*    Lipid Panel:     Component Value Date/Time   CHOL 181 10/09/2015 0229   TRIG 45 10/09/2015 0229   HDL 53 10/09/2015 0229  CHOLHDL 3.4 10/09/2015 0229   VLDL 9 10/09/2015 0229   LDLCALC 119* 10/09/2015 0229    HgbA1C: No results found for: HGBA1C  Urine Drug Screen:  No results found for: LABOPIA, COCAINSCRNUR, LABBENZ, AMPHETMU, THCU, LABBARB  Alcohol Level: No results for input(s): ETH in the last 168 hours.  Other results:  Imaging: Dg Chest 2 View  10/08/2015  CLINICAL DATA:  Chronic generalized chest and abdominal pain. Delirium. Initial encounter. EXAM: CHEST  2 VIEW COMPARISON:  Chest radiograph performed 06/16/2014 FINDINGS: The lungs are well-aerated and clear. There is no evidence of focal opacification, pleural effusion or pneumothorax. The heart is normal in size; the mediastinal contour is within normal limits. No acute osseous abnormalities are seen. A chronic compression deformity is noted at the lower thoracic spine. IMPRESSION: No acute cardiopulmonary process seen. Electronically Signed   By: Garald Balding M.D.   On: 10/08/2015 23:58   Ct Head Wo Contrast  10/08/2015  CLINICAL DATA:  Increased confusion. Altered mental status. Fall. Patient amnestic for the event. EXAM: CT HEAD WITHOUT CONTRAST TECHNIQUE: Contiguous axial images were obtained from the base of the skull through the vertex without intravenous contrast. COMPARISON:  MR brain 05/22/2013.  CT head 05/03/2012.  FINDINGS: No evidence for acute infarction, hemorrhage, mass lesion, hydrocephalus, or extra-axial fluid. Generalized atrophy. Chronic microvascular ischemic change. Scattered areas of chronic lacunar infarction. No CT signs of proximal vascular thrombosis. Advanced vascular calcification in the carotid and vertebral arteries. Calvarium is intact. No sinus or mastoid air fluid level. Similar appearance to priors. IMPRESSION: Atrophy and small vessel disease. Areas of chronic ischemia. No skull fracture or intracranial hemorrhage. Electronically Signed   By: Staci Righter M.D.   On: 10/08/2015 23:02   Mr Brain Wo Contrast  10/09/2015  CLINICAL DATA:  82 year old hypertensive female found down at home with altered mental status. Subsequent encounter. EXAM: MRI HEAD WITHOUT CONTRAST TECHNIQUE: Multiplanar, multiecho pulse sequences of the brain and surrounding structures were obtained without intravenous contrast. COMPARISON:  10/08/2015 CT.  05/22/2013 brain MR. FINDINGS: Exam is motion degraded. Probable artifact left midbrain (series 4, image 18) without discrete acute infarct noted. Remote infarcts basal ganglia and corona radiata bilaterally. Remote infarct at the junction of the right thalamus and posterior limb right internal capsule. Mild to moderate chronic small vessel disease changes. No intracranial hemorrhage. Global atrophy without hydrocephalus. No intracranial mass lesion noted on this unenhanced exam. Major intracranial vascular structures are patent. Post lens replacement otherwise orbital structures unremarkable. Cervical medullary junction and pituitary region unremarkable. IMPRESSION: Exam is motion degraded. Probable artifact left midbrain without acute infarct noted. Remote infarcts basal ganglia and corona radiata bilaterally. Remote infarct at the junction of the right thalamus and posterior limb right internal capsule. Mild to moderate chronic small vessel disease changes. Global atrophy  without hydrocephalus. Electronically Signed   By: Genia Del M.D.   On: 10/09/2015 08:57     Assessment/Plan:  82y/o woman with hx of HTN, TIA, stroke admitted 2/07 with altered mental status. Called to EEG after tech noted clinical and electrographic seizure activity. Upon arrival, patient noted to have right gaze deviation with rhythmic activity of her right side. Was unresponsive to voice or light noxious stimuli. Consistent with complex partial seizure. Episode broke with 2mg  of ativan. Unclear etiology of new onset seizure. Case discussed with primary team.   -keppra 1000mg  IV x 1, then 500mg  Q12hrs -CMP, CBC, UA, Mg -transfer to step-down for closer monitoring -if further seizure activity  or no improvement in mental status will consider LTM   Jim Like, DO [facility] [phone]  If 7pm- 7am, please page neurology on call as listed in AMION. 10/10/2015, 11:39 AM

## 2015-10-10 NOTE — Care Management Note (Signed)
Case Management Note  Patient Details  Name: Kathryn [name] MRN: [id number] Date of Birth: 04/01/1931  Subjective/Objective:                 Admitted with AMS, encephalopathy. Marland Kitchen PMH - chronic abdominal pain, chronic back pain, HTN, recurrent UTI's, depression, anxiety. Lives alone.   Action/Plan: Per PT's recommendation:  SNF. CM to f/u with disposition needs.  Expected Discharge Date:                  Expected Discharge Plan:  Skilled Nursing Facility  In-House Referral:  Clinical Social Work  Discharge planning Services  CM Consult  Post Acute Care Choice:    Choice offered to:     DME Arranged:    DME Agency:     HH Arranged:    [place] Agency:     Status of Service:  In process, will continue to follow  Medicare Important Message Given:    Date Medicare IM Given:    Medicare IM give by:    Date Additional Medicare IM Given:    Additional Medicare Important Message give by:     If discussed at [place] of Stay Meetings, dates discussed:    Additional Comments:  Marcee Lallo [facility]) Manuela Schwartz  [phone]  [address] [place], [place] [phone] 10/10/2015, 9:55 AM

## 2015-10-10 NOTE — Progress Notes (Signed)
Report called to [place] spoke to Kathryn [name]  Going to room 11

## 2015-10-10 NOTE — Progress Notes (Signed)
PROGRESS NOTE  Kathryn [name] [id number] DOB: [birth year] DOA: 10/08/2015 PCP: Lilian Coma, MD   HPI: Kathryn [name] is an 82 y.o. female with a PMH of hypertension, hyperlipidemia, GERD, hypothyroidism, depression/anxiety, TIA/stroke, recurrent UTI, chronic back and abdominal pain who was admitted 10/08/15 with altered mental status and hallucinations.    Subjective / 24 H Interval events According to RN the patient was agitated last night, and repeatedly attempted to get out of bed so 0.5mg  Ativan was given at 4am. This morning the patient is lethargic, she opens her eyes but will not speak or follow commands and has minimal response to painful stimuli.   Assessment/Plan: Principal Problem:   Acute encephalopathy Active Problems:   Spinal stenosis, lumbar region, with neurogenic claudication   Generalized anxiety disorder   GERD (gastroesophageal reflux disease)   Essential hypertension, benign   Recurrent falls   History of stroke   Polypharmacy   Generalized ischemic cerebrovascular disease   Chronic abdominal pain   Acute encephalopathy due to non convulsive status epilepticus  - Initially felt to be polypharmacy from anticholinergics and anxiolytics however as the patient appeared less responsive this morning additional workup was initiated with EEG which showed active seizures and likely status epilepticus  - ABG unremarkable and labs reviewed, no evidence of significant metabolic disturbance, TSH and ammonia normal. CT head on admission negative and MRI with no acute intracranial process - D/w Neurology - will transfer to stepdown and initiate Keppra load  UTI - No fevers or leukocytosis - Continue IV Levaquin and monitor cultures  Generalized anxiety disorder - Home regimen includes PRN clonazepam, valium  Essential Hypertension - BP elevated, better this am - Home meds (Amlodipine, losartan) on hold until mentation has improved, unable to reliably take PO meds at  this time - IV hydralazine PRN  History of CVA - Resume ASA and statin when mentation improved and able to take PO meds  Hyperlipidemia - Hold statin at this time, see above  Chronic abdominal pain - Extensive workup in the past with no etiology found. - Zofran as needed for nausea, will resume PPI when taking PO. - Lipase and LFTs normal   Diet: Diet Heart Room service appropriate?: Yes; Fluid consistency:: Thin Fluids: NS 59ml/hr DVT Prophylaxis: Lovenox  Code Status: Full Code Family Communication: d/w son at bedside Disposition Plan: transfer to SDU Barriers to discharge: acuity  Consultants:  Neurology   Procedures:  EEG - called for initial results showing status epilepticus, final report pending.   Antibiotics  Anti-infectives    Start     Dose/Rate Route Frequency Ordered Stop   10/09/15 0400  levofloxacin (LEVAQUIN) IVPB 750 mg     750 mg 100 mL/hr over 90 Minutes Intravenous Every 48 hours 10/09/15 0358         Studies  Dg Chest 2 View  10/08/2015  CLINICAL DATA:  Chronic generalized chest and abdominal pain. Delirium. Initial encounter. EXAM: CHEST  2 VIEW COMPARISON:  Chest radiograph performed 06/16/2014 FINDINGS: The lungs are well-aerated and clear. There is no evidence of focal opacification, pleural effusion or pneumothorax. The heart is normal in size; the mediastinal contour is within normal limits. No acute osseous abnormalities are seen. A chronic compression deformity is noted at the lower thoracic spine. IMPRESSION: No acute cardiopulmonary process seen. Electronically Signed   By: Garald Balding M.D.   On: 10/08/2015 23:58   Ct Head Wo Contrast  10/08/2015  CLINICAL DATA:  Increased confusion. Altered mental  status. Fall. Patient amnestic for the event. EXAM: CT HEAD WITHOUT CONTRAST TECHNIQUE: Contiguous axial images were obtained from the base of the skull through the vertex without intravenous contrast. COMPARISON:  MR brain 05/22/2013.  CT  head 05/03/2012. FINDINGS: No evidence for acute infarction, hemorrhage, mass lesion, hydrocephalus, or extra-axial fluid. Generalized atrophy. Chronic microvascular ischemic change. Scattered areas of chronic lacunar infarction. No CT signs of proximal vascular thrombosis. Advanced vascular calcification in the carotid and vertebral arteries. Calvarium is intact. No sinus or mastoid air fluid level. Similar appearance to priors. IMPRESSION: Atrophy and small vessel disease. Areas of chronic ischemia. No skull fracture or intracranial hemorrhage. Electronically Signed   By: Staci Righter M.D.   On: 10/08/2015 23:02   Mr Brain Wo Contrast  10/09/2015  CLINICAL DATA:  82 year old hypertensive female found down at home with altered mental status. Subsequent encounter. EXAM: MRI HEAD WITHOUT CONTRAST TECHNIQUE: Multiplanar, multiecho pulse sequences of the brain and surrounding structures were obtained without intravenous contrast. COMPARISON:  10/08/2015 CT.  05/22/2013 brain MR. FINDINGS: Exam is motion degraded. Probable artifact left midbrain (series 4, image 18) without discrete acute infarct noted. Remote infarcts basal ganglia and corona radiata bilaterally. Remote infarct at the junction of the right thalamus and posterior limb right internal capsule. Mild to moderate chronic small vessel disease changes. No intracranial hemorrhage. Global atrophy without hydrocephalus. No intracranial mass lesion noted on this unenhanced exam. Major intracranial vascular structures are patent. Post lens replacement otherwise orbital structures unremarkable. Cervical medullary junction and pituitary region unremarkable. IMPRESSION: Exam is motion degraded. Probable artifact left midbrain without acute infarct noted. Remote infarcts basal ganglia and corona radiata bilaterally. Remote infarct at the junction of the right thalamus and posterior limb right internal capsule. Mild to moderate chronic small vessel disease changes.  Global atrophy without hydrocephalus. Electronically Signed   By: Genia Del M.D.   On: 10/09/2015 08:57    Objective  Filed Vitals:   10/09/15 1550 10/09/15 2050 10/10/15 0719 10/10/15 0841  BP: 182/82 156/80 152/53 102/61  Pulse: 75 65 61   Temp:  98.1 F (36.7 C)    TempSrc:  Oral    Resp:  16 20   Height:      Weight:      SpO2:  100% 100%     Intake/Output Summary (Last 24 hours) at 10/10/15 1014 Last data filed at 10/09/15 2042  Gross per 24 hour  Intake    720 ml  Output    250 ml  Net    470 ml   Filed Weights   10/09/15 1500  Weight: 54.6 kg (120 lb 5.9 oz)   Exam:  GENERAL: opens eyes, maintains rightward gaze, nonverbal at present  HEENT: pupils unequal R>L minimally reactive to light, no scleral icterus  NECK: supple  LUNGS: CTA biL, no wheezing, respirations nonlabored  HEART: RRR without MRG  ABDOMEN: soft, non tender, no distended  EXTREMITIES: no clubbing / cyanosis, warm to touch, no edema  NEUROLOGIC: moves all 4 extremities spontaneously but does not follow commands, no response to painful stimuli  SKIN: no rashes  Data Reviewed: Basic Metabolic Panel:  Recent Labs Lab 10/05/15 1504 10/08/15 2059 10/09/15 0229  NA 133* 132* 134*  K 3.7 3.8 3.7  CL 96* 93* 97*  CO2 27 24 23   GLUCOSE 92 112* 97  BUN 9 13 11   CREATININE 0.61 0.81 0.70  CALCIUM 9.8 10.3 10.3   Liver Function Tests:  Recent Labs Lab 10/05/15  1504 10/08/15 2059  AST 25 26  ALT 12* 14  ALKPHOS 51 48  BILITOT 0.9 1.0  PROT 7.5 6.8  ALBUMIN 4.3 4.1    Recent Labs Lab 10/05/15 1504 10/09/15 0228  LIPASE 25 35    Recent Labs Lab 10/08/15 2100  AMMONIA 16   CBC:  Recent Labs Lab 10/05/15 1504 10/08/15 2059 10/09/15 0229  WBC 3.7* 4.9 5.6  NEUTROABS 1.6*  --   --   HGB 12.2 11.9* 11.3*  HCT 36.4 35.4* 33.3*  MCV 91.2 89.6 89.0  PLT 218 214 214   Cardiac Enzymes:  Recent Labs Lab 10/09/15 0035  CKTOTAL 79   BNP (last 3  results)  Recent Labs  10/09/15 0228  BNP 64.5   CBG:  Recent Labs Lab 10/08/15 2059 10/09/15 0854 10/10/15 0739  GLUCAP 110* 100* 90   Recent Results (from the past 240 hour(s))  Urine culture     Status: None   Collection Time: 10/05/15  2:27 PM  Result Value Ref Range Status   Specimen Description URINE, CATHETERIZED  Final   Special Requests Normal  Final   Culture   Final    NO GROWTH 2 DAYS Performed at [facility]    Report Status 10/07/2015 FINAL  Final    Scheduled Meds: . acidophilus  1 capsule Oral Daily  . amLODipine  5 mg Oral Daily  . aspirin EC  81 mg Oral Daily  . calcium-vitamin D  1 tablet Oral Q breakfast  . enoxaparin (LOVENOX) injection  40 mg Subcutaneous Daily  . estradiol  0.025 mg Transdermal Weekly  . Influenza vac split quadrivalent PF  0.5 mL Intramuscular Tomorrow-1000  . levofloxacin (LEVAQUIN) IV  750 mg Intravenous Q48H  . losartan  100 mg Oral Daily  . nystatin-triamcinolone  1 application Topical BID  . pantoprazole  40 mg Oral q morning - 10a  . propranolol  40 mg Oral TID  . simvastatin  40 mg Oral q morning - 10a  . sodium chloride flush  3 mL Intravenous Q12H  . traMADol  50 mg Oral Once  . vitamin B-12  1,000 mcg Oral Daily  . vitamin C  500 mg Oral Daily  . vitamin E  200 Units Oral Daily   Continuous Infusions: . sodium chloride 75 mL/hr at 10/10/15 0720    Time spent coordinating care: > 35 minutes, greater than 50% of this time was spent bedside evaluating the patient, family discussions and discussing with neurology.   Marzetta Board, MD [facility] Pager [phone]. If 7 PM - 7 AM, please contact night-coverage at [email or web address], password [facility] 10/10/2015, 10:14 AM  LOS: 1 day

## 2015-10-10 NOTE — Progress Notes (Signed)
Spoke to son Scarlette Slice r/t need for head CT due to AMS

## 2015-10-10 NOTE — Progress Notes (Signed)
Made Dr Renne Crigler aware of Am meds not given

## 2015-10-10 NOTE — Progress Notes (Signed)
EEG Completed; Results Pending  

## 2015-10-10 NOTE — NC FL2 (Addendum)
[place] LEVEL OF CARE SCREENING TOOL     IDENTIFICATION  Patient Name: Kathryn [name] Birthdate: 1930/11/10 Sex: female Admission Date (Current Location): 10/08/2015  [facility] and [place] Number:  Herbalist and Address:  [facility], [place] [address] [place], [place] [address]      Provider Number: [id number]  Attending Physician Name and Address:  Oswald Hillock, MD  Relative Name and Phone Number:  Scarlette Slice, son, [phone]    Current Level of Care: Hospital Recommended Level of Care: [place] Prior Approval Number:    Date Approved/Denied:   PASRR Number: [id number]  Discharge Plan: SNF    Current Diagnoses: Patient Active Problem List   Diagnosis Date Noted  . Acute encephalopathy 10/09/2015  . History of stroke 10/09/2015  . Polypharmacy 10/09/2015  . Generalized ischemic cerebrovascular disease 10/09/2015  . Chronic abdominal pain 10/09/2015  . Delirium   . Numbness of foot   . Recurrent falls 05/21/2013  . Pre-syncope 05/20/2013  . Chronic daily headache 05/17/2013  . Essential hypertension, benign 02/01/2013  . Generalized anxiety disorder 01/17/2013  . GERD (gastroesophageal reflux disease) 01/17/2013  . Other and unspecified hyperlipidemia 01/17/2013  . Insomnia 01/17/2013  . Acute blood loss anemia 01/10/2013  . Spinal stenosis, lumbar region, with neurogenic claudication 01/07/2013  . Bowel habit changes 12/22/2011  . Hemorrhoids 12/22/2011  . PROLAPSE, RECTAL 07/11/2008  . SLEEP APNEA 07/11/2008  . RECTAL INCONTINENCE 07/11/2008  . COLONIC POLYPS, ADENOMATOUS, HX OF 07/11/2008    Orientation RESPIRATION BLADDER Height & Weight     Self  Normal Incontinent Weight: 120 lb 5.9 oz (54.6 kg) Height:  5\' 3"  (160 cm) (per patient )  BEHAVIORAL SYMPTOMS/MOOD NEUROLOGICAL BOWEL NUTRITION STATUS   (N/A)   Incontinent  (Please see DC summary)  AMBULATORY STATUS COMMUNICATION OF NEEDS Skin     Extensive Assist Verbally Normal                       Personal Care Assistance Level of Assistance  Bathing, Feeding, Dressing Bathing Assistance: Limited assistance Feeding assistance: NG tube feeding Dressing Assistance: Limited assistance     Functional Limitations Info             SPECIAL CARE FACTORS FREQUENCY   Physical Therapy Speech Therapy  3x a week                  Contractures      Additional Factors Info  Code Status, Allergies    Code Status DNR Allergies Info: Augmentin, Citalopram, Codeine, Fish-derived Products, Hyoscyamine, Ibuprofen, Latex, Lipitor, Septra, Shellfish Allergy, Miconazole, Hydrocodone-acetaminophen, Keflet, Meloxicam, Sulfa Antibiotics, Verapamil, Vicodin, Zoloft, Ciprofloxacin, Depakene, Isoptin Sr, Lisinopril, Telmisartan-hctz           Current Medications (10/10/2015):  This is the current hospital active medication list Current Facility-Administered Medications  Medication Dose Route Frequency Provider Last Rate Last Dose  . 0.9 %  sodium chloride infusion   Intravenous Continuous Ivor Costa, MD 75 mL/hr at 10/10/15 0720    . acidophilus (RISAQUAD) capsule 1 capsule  1 capsule Oral Daily Ivor Costa, MD   1 capsule at 10/09/15 1219  . amLODipine (NORVASC) tablet 5 mg  5 mg Oral Daily Ivor Costa, MD   5 mg at 10/09/15 1218  . aspirin EC tablet 81 mg  81 mg Oral Daily Ivor Costa, MD   81 mg at 10/09/15 1217  . calcium-vitamin D (OSCAL  WITH D) 500-200 MG-UNIT per tablet 1 tablet  1 tablet Oral Q breakfast Ivor Costa, MD   1 tablet at 10/09/15 1218  . enoxaparin (LOVENOX) injection 40 mg  40 mg Subcutaneous Daily Ivor Costa, MD   40 mg at 10/09/15 1550  . estradiol (CLIMARA - Dosed in mg/24 hr) patch 0.025 mg  0.025 mg Transdermal Weekly Ivor Costa, MD      . fluticasone (FLONASE) 50 MCG/ACT nasal spray 2 spray  2 spray Each Nare Daily PRN Ivor Costa, MD      . hydrALAZINE (APRESOLINE) injection 5 mg  5 mg Intravenous Q2H PRN Ivor Costa, MD      . Influenza vac split quadrivalent PF (FLUARIX) injection 0.5 mL  0.5 mL Intramuscular Tomorrow-1000 Christina P Rama, MD      . levofloxacin (LEVAQUIN) IVPB 750 mg  750 mg Intravenous Q48H Erenest Blank, RPH   Stopped at 10/09/15 [id number]  . LORazepam (ATIVAN) injection 0.5 mg  0.5 mg Intravenous Q4H PRN Ivor Costa, MD   0.5 mg at 10/10/15 0423  . losartan (COZAAR) tablet 100 mg  100 mg Oral Daily Ivor Costa, MD   100 mg at 10/09/15 1219  . nystatin-triamcinolone (MYCOLOG II) cream 1 application  1 application Topical BID Lauren D Bajbus, RPH   1 application at [id number]  . ondansetron (ZOFRAN) tablet 4 mg  4 mg Oral Q6H PRN Ivor Costa, MD       Or  . ondansetron [facility]) injection 4 mg  4 mg Intravenous Q6H PRN Ivor Costa, MD      . pantoprazole (PROTONIX) EC tablet 40 mg  40 mg Oral q morning - 10a Ivor Costa, MD   40 mg at 10/09/15 1218  . propranolol (INDERAL) tablet 40 mg  40 mg Oral TID Ivor Costa, MD   40 mg at 10/09/15 2244  . simvastatin (ZOCOR) tablet 40 mg  40 mg Oral q morning - 10a Ivor Costa, MD   40 mg at 10/09/15 1217  . sodium chloride flush (NS) 0.9 % injection 3 mL  3 mL Intravenous Q12H Ivor Costa, MD   3 mL at 10/09/15 0114  . traMADol (ULTRAM) tablet 50 mg  50 mg Oral Once Gardiner Barefoot, NP   50 mg at 10/10/15 0407  . vitamin B-12 (CYANOCOBALAMIN) tablet 1,000 mcg  1,000 mcg Oral Daily Ivor Costa, MD   1,000 mcg at 10/09/15 1219  . vitamin C (ASCORBIC ACID) tablet 500 mg  500 mg Oral Daily Ivor Costa, MD   500 mg at 10/09/15 1218  . vitamin E capsule 200 Units  200 Units Oral Daily Ivor Costa, MD   200 Units at 10/09/15 1218     Discharge Medications: Please see discharge summary for a list of discharge medications.  Relevant Imaging Results:  Relevant Lab Results:   Additional Information SSN: [place] [id number]  [place], [place], MSW, [place] 10/31/2015 5:44 PM

## 2015-10-10 NOTE — Progress Notes (Signed)
Patient complained of migraine headache and stomach cramps, offered the patient Tramadol. Patient refused. Patient became combative trying to kick nurse and climb out of bed. Ativan given, decrease stimulation in room and nurse tech sat at bed side with patient. Will continue to monitor patient.

## 2015-10-10 NOTE — Progress Notes (Signed)
OT Cancellation Note  Patient Details Name: Kathryn [name] MRN: [id number] DOB: April 06, 1931   Cancelled Treatment:    Reason Eval/Treat Not Completed: Other (comment) (pt transferring to step down due to seizure activity and non-responsiviness according to NT). Will check back as appropriate and as schedule allows. Thank you for the order.  Chrys Racer , MS, OTR/L, CLT Pager: [phone]  10/10/2015, 1:52 PM

## 2015-10-11 ENCOUNTER — Inpatient Hospital Stay (HOSPITAL_COMMUNITY): Payer: Medicare Other

## 2015-10-11 DIAGNOSIS — G40901 Epilepsy, unspecified, not intractable, with status epilepticus: Secondary | ICD-10-CM | POA: Diagnosis present

## 2015-10-11 LAB — CSF CELL COUNT WITH DIFFERENTIAL
RBC COUNT CSF: 140 /mm3 — AB
RBC COUNT CSF: 7 /mm3 — AB
TUBE #: 1
Tube #: 4
WBC CSF: 2 /mm3 (ref 0–5)
WBC, CSF: 2 /mm3 (ref 0–5)

## 2015-10-11 LAB — URINALYSIS, ROUTINE W REFLEX MICROSCOPIC
BILIRUBIN URINE: NEGATIVE
Glucose, UA: NEGATIVE mg/dL
Hgb urine dipstick: NEGATIVE
KETONES UR: 15 mg/dL — AB
LEUKOCYTES UA: NEGATIVE
NITRITE: NEGATIVE
PROTEIN: NEGATIVE mg/dL
Specific Gravity, Urine: 1.015 (ref 1.005–1.030)
pH: 6 (ref 5.0–8.0)

## 2015-10-11 LAB — GLUCOSE, CAPILLARY
GLUCOSE-CAPILLARY: 70 mg/dL (ref 65–99)
Glucose-Capillary: 98 mg/dL (ref 65–99)

## 2015-10-11 LAB — PROTEIN AND GLUCOSE, CSF
Glucose, CSF: 51 mg/dL (ref 40–70)
Total  Protein, CSF: 38 mg/dL (ref 15–45)

## 2015-10-11 LAB — MRSA PCR SCREENING: MRSA BY PCR: NEGATIVE

## 2015-10-11 MED ORDER — DEXTROSE-NACL 5-0.45 % IV SOLN
INTRAVENOUS | Status: DC
  Filled 2015-10-11: qty 1000

## 2015-10-11 MED ORDER — ACETAMINOPHEN 325 MG RE SUPP
325.0000 mg | RECTAL | Status: DC | PRN
  Administered 2015-10-11 – 2015-10-12 (×2): 325 mg via RECTAL
  Filled 2015-10-11 (×4): qty 1

## 2015-10-11 MED ORDER — PANTOPRAZOLE SODIUM 40 MG IV SOLR
40.0000 mg | INTRAVENOUS | Status: DC
  Administered 2015-10-11 – 2015-10-12 (×2): 40 mg via INTRAVENOUS
  Filled 2015-10-11 (×2): qty 40

## 2015-10-11 MED ORDER — ACETAMINOPHEN 650 MG RE SUPP
325.0000 mg | Freq: Three times a day (TID) | RECTAL | Status: DC | PRN
  Administered 2015-10-11: 325 mg via RECTAL
  Filled 2015-10-11: qty 1

## 2015-10-11 MED ORDER — DEXTROSE-NACL 5-0.45 % IV SOLN
INTRAVENOUS | Status: DC
  Administered 2015-10-11 – 2015-10-12 (×2): via INTRAVENOUS

## 2015-10-11 MED ORDER — DEXTROSE 5 % IV SOLN
500.0000 mg | Freq: Two times a day (BID) | INTRAVENOUS | Status: DC
  Administered 2015-10-11 – 2015-10-13 (×4): 500 mg via INTRAVENOUS
  Filled 2015-10-11 (×8): qty 10

## 2015-10-11 MED ORDER — SODIUM CHLORIDE 0.9 % IV BOLUS (SEPSIS)
500.0000 mL | Freq: Once | INTRAVENOUS | Status: AC
  Administered 2015-10-11: 500 mL via INTRAVENOUS

## 2015-10-11 NOTE — Progress Notes (Signed)
Pharmacy Antibiotic Note  Kathryn [name] is a 82 y.o. female admitted on 10/08/2015 with altered mental status and hallucination.  Pharmacy has been consulted for acyclovir and levofloxacin dosing for possible HSV encephalitis and UTI.  Day #3 levaquin and day #0 acyclovir. Temp 100.7 overnight, now 101.2, WBC WNL, creat 0.72, wt 54.6 kg, creat cl ~ 50 ml/min. S/p LP today.  Plan: - acyclovir 10 mg/kg = 500 mg q12h - continues on levaquin 750 mg IV q48 for UTI - consider stopping levaquin - has a black box warning that includes CNS effects - monitor renal fxn, wbc, temp, culture data  Height: 5\' 3"  (160 cm) (per patient ) Weight: 120 lb 5.9 oz (54.6 kg) IBW/kg (Calculated) : 52.4  Temp (24hrs), Avg:99.9 F (37.7 C), Min:98 F (36.7 C), Max:101.2 F (38.4 C)   Recent Labs Lab 10/05/15 1504 10/08/15 2059 10/09/15 0229 10/10/15 1405  WBC 3.7* 4.9 5.6 7.3  CREATININE 0.61 0.81 0.70 0.72    Estimated Creatinine Clearance: 44.1 mL/min (by C-G formula based on Cr of 0.72).    Allergies  Allergen Reactions  . Augmentin [Amoxicillin-Pot Clavulanate] Swelling and Diarrhea    Throat   . Citalopram Swelling    Eyes ears and throat swelling Throat and eyes   . Codeine Anaphylaxis and Shortness Of Breath  . Fish-Derived Products Anaphylaxis  . Hyoscyamine Anaphylaxis and Swelling  . Ibuprofen Swelling    Throat and eyes   . Latex Swelling and Rash    Swelling to troat  . Lipitor [Atorvastatin] Swelling    Muscle aches throat  . Septra [Bactrim] Anaphylaxis  . Shellfish Allergy Anaphylaxis  . Miconazole Rash  . Hydrocodone-Acetaminophen     Stroke  . Keflet [Cephalexin] Diarrhea    Upset stomach  . Meloxicam Diarrhea and Nausea And Vomiting  . Sulfa Antibiotics     Rash and also throat was tight  . Verapamil     Tachycardia and flushing  . Vicodin [Hydrocodone-Acetaminophen] Other (See Comments)    stroke  . Zoloft [Sertraline Hcl] Swelling    Red spots all over face,  swelling of tongue and legs.   . Ciprofloxacin Nausea And Vomiting    Other reaction(s): Abdominal Pain  . Depakene [Valproate Sodium] Hives    All over body   . Isoptin Sr [Verapamil Hcl Er] Cough  . Lisinopril Cough    Fatigue and cough  . Telmisartan-Hctz Cough    Antimicrobials this admission: Levaquin 2/7>> Acyclovir 2/9>>  Dose adjustments this admission: none  Microbiology results: 2/8 MRSA PCR neg 2/7 BCx2>>ngtd 2/7 UC>> > 100 diptheroids F 2/9 CSF>>   Thank you for allowing pharmacy to be a part of this patient's care. Eudelia Bunch, Pharm.D. [phone] 10/11/2015 12:58 PM

## 2015-10-11 NOTE — Progress Notes (Signed)
LTM day 1 started; pt glued. Event button tested.

## 2015-10-11 NOTE — Progress Notes (Signed)
Subjective: No overnight events. Per RN, patient was more alert overnight. Eyes closed, non-verbal this morning. Temperature 100.7, no leukocytosis.   Objective: Current vital signs: BP 169/76 mmHg  Pulse 66  Temp(Src) 100.7 F (38.2 C) (Axillary)  Resp 16  Ht 5\' 3"  (1.6 m)  Wt 54.6 kg (120 lb 5.9 oz)  BMI 21.33 kg/m2  SpO2 100% Vital signs in last 24 hours: Temp:  [98 F (36.7 C)-100.7 F (38.2 C)] 100.7 F (38.2 C) (02/09 0400) Pulse Rate:  [66-78] 66 (02/09 0400) Resp:  [16-21] 16 (02/09 0400) BP: (102-178)/(61-78) 169/76 mmHg (02/09 0400) SpO2:  [94 %-100 %] 100 % (02/09 0400)  Intake/Output from previous day: 02/08 0701 - 02/09 0700 In: 1837 [I.V.:1537; IV Piggyback:300] Out: 1200 [Urine:1200] Intake/Output this shift: Total I/O In: -  Out: 550 [Urine:550] Nutritional status: Diet Heart Room service appropriate?: Yes; Fluid consistency:: Thin  Neurologic Exam: Mental Status: Eyes closed, resists opening, non-verbal, not following commands Cranial Nerves: II: optic discs not visualized, no blink to threat, pupils equal, round, reactive to light III,IV, VI: ptosis not present, no gaze deviation noted V,VII: face symmetric Motor: Increased tone throughout, brief clonic type movements of right side No spontaneous movement of left side Sensory: no withdrawal to noxious stimuli  Lab Results: Basic Metabolic Panel:  Recent Labs Lab 10/05/15 1504 10/08/15 2059 10/09/15 0229 10/10/15 1405  NA 133* 132* 134* 136  K 3.7 3.8 3.7 3.3*  CL 96* 93* 97* 101  CO2 27 24 23 26   GLUCOSE 92 112* 97 98  BUN 9 13 11 7   CREATININE 0.61 0.81 0.70 0.72  CALCIUM 9.8 10.3 10.3 9.8  MG  --   --   --  1.4*    Liver Function Tests:  Recent Labs Lab 10/05/15 1504 10/08/15 2059 10/10/15 1405  AST 25 26 25   ALT 12* 14 14  ALKPHOS 51 48 47  BILITOT 0.9 1.0 1.1  PROT 7.5 6.8 6.6  ALBUMIN 4.3 4.1 3.6    Recent Labs Lab 10/05/15 1504 10/09/15 0228  LIPASE 25 35     Recent Labs Lab 10/08/15 2100  AMMONIA 16    CBC:  Recent Labs Lab 10/05/15 1504 10/08/15 2059 10/09/15 0229 10/10/15 1405  WBC 3.7* 4.9 5.6 7.3  NEUTROABS 1.6*  --   --   --   HGB 12.2 11.9* 11.3* 12.0  HCT 36.4 35.4* 33.3* 35.6*  MCV 91.2 89.6 89.0 89.9  PLT 218 214 214 215    Cardiac Enzymes:  Recent Labs Lab 10/09/15 0035  CKTOTAL 79    Lipid Panel:  Recent Labs Lab 10/09/15 0229  CHOL 181  TRIG 45  HDL 53  CHOLHDL 3.4  VLDL 9  LDLCALC 119*    CBG:  Recent Labs Lab 10/08/15 2059 10/09/15 0854 10/10/15 0739  GLUCAP 110* 100* 90    Microbiology: Results for orders placed or performed during the hospital encounter of 10/08/15  Urine culture     Status: None   Collection Time: 10/09/15  1:43 AM  Result Value Ref Range Status   Specimen Description URINE, CATHETERIZED  Final   Special Requests NONE  Final   Culture   Final    >=100,000 COLONIES/mL DIPHTHEROIDS(CORYNEBACTERIUM SPECIES) Standardized susceptibility testing for this organism is not available.    Report Status 10/10/2015 FINAL  Final  Culture, blood (Routine X 2) w Reflex to ID Panel     Status: None (Preliminary result)   Collection Time: 10/09/15  5:30 AM  Result Value Ref Range Status   Specimen Description BLOOD LEFT HAND  Final   Special Requests IN PEDIATRIC BOTTLE 1CC  Final   Culture NO GROWTH 1 DAY  Final   Report Status PENDING  Incomplete  Culture, blood (Routine X 2) w Reflex to ID Panel     Status: None (Preliminary result)   Collection Time: 10/09/15  5:38 AM  Result Value Ref Range Status   Specimen Description BLOOD LEFT ARM  Final   Special Requests IN PEDIATRIC BOTTLE 1CC  Final   Culture NO GROWTH 1 DAY  Final   Report Status PENDING  Incomplete  MRSA PCR Screening     Status: None   Collection Time: 10/10/15 11:39 PM  Result Value Ref Range Status   MRSA by PCR NEGATIVE NEGATIVE Final    Comment:        The GeneXpert MRSA Assay (FDA approved for  NASAL specimens only), is one component of a comprehensive MRSA colonization surveillance program. It is not intended to diagnose MRSA infection nor to guide or monitor treatment for MRSA infections.     Coagulation Studies:  Recent Labs  10/09/15 0228  LABPROT 14.9  INR 1.16    Imaging: Ct Head Wo Contrast  10/10/2015  CLINICAL DATA:  Recent seizure activity EXAM: CT HEAD WITHOUT CONTRAST TECHNIQUE: Contiguous axial images were obtained from the base of the skull through the vertex without intravenous contrast. COMPARISON:  10/08/2015, 10/09/2015 FINDINGS: Bony calvarium is intact. Mild atrophic changes are noted. Areas of chronic white matter ischemic change are seen in the deep white matter. Additionally lacunar infarcts are noted adjacent to the right lateral ventricle. These are stable from the prior exam. No acute hemorrhage, acute infarction or space-occupying mass lesion is noted. IMPRESSION: Chronic atrophic and ischemic changes without acute abnormality. Electronically Signed   By: Inez Catalina M.D.   On: 10/10/2015 12:10   Mr Brain Wo Contrast  10/09/2015  CLINICAL DATA:  82 year old hypertensive female found down at home with altered mental status. Subsequent encounter. EXAM: MRI HEAD WITHOUT CONTRAST TECHNIQUE: Multiplanar, multiecho pulse sequences of the brain and surrounding structures were obtained without intravenous contrast. COMPARISON:  10/08/2015 CT.  05/22/2013 brain MR. FINDINGS: Exam is motion degraded. Probable artifact left midbrain (series 4, image 18) without discrete acute infarct noted. Remote infarcts basal ganglia and corona radiata bilaterally. Remote infarct at the junction of the right thalamus and posterior limb right internal capsule. Mild to moderate chronic small vessel disease changes. No intracranial hemorrhage. Global atrophy without hydrocephalus. No intracranial mass lesion noted on this unenhanced exam. Major intracranial vascular structures are  patent. Post lens replacement otherwise orbital structures unremarkable. Cervical medullary junction and pituitary region unremarkable. IMPRESSION: Exam is motion degraded. Probable artifact left midbrain without acute infarct noted. Remote infarcts basal ganglia and corona radiata bilaterally. Remote infarct at the junction of the right thalamus and posterior limb right internal capsule. Mild to moderate chronic small vessel disease changes. Global atrophy without hydrocephalus. Electronically Signed   By: Genia Del M.D.   On: 10/09/2015 08:57    Medications: I have reviewed the patient's current medications.  Assessment/Plan:  82y/o woman with hx of HTN, TIA, stroke admitted 2/07 with altered mental status and visual hallucinations. Noted to have complex partial seizures during routine EEG on 2/08. These episodes broke with ativan and she appeared post-ictal. This morning found to be unresponsive, intermittent clonic activity of RUE, weak withdrawal in extremities. Cannot rule out ongoing seizure activity.  In setting of low grade fever and new onset seizure concern for possible CNS infection.  -LTM EEG (discussed with EEG tech) -continue keppra, further AED adjustment pending EEG results -hold Lovenox and plan for LP this morning    LOS: 2 days   Jim Like, DO [facility] [phone]  If 7pm- 7am, please page neurology on call as listed in [facility]. 10/11/2015  8:20 AM

## 2015-10-11 NOTE — Evaluation (Signed)
Occupational Therapy Evaluation Patient Details Name: Kathryn [name] MRN: [id number] DOB: 16-Sep-1930 Today's Date: 10/11/2015    History of Present Illness Pt adm with acute encephalopathy and hallucinations. UTI positive. MRI negative. PMH - chronic abdominal pain, chronic back pain, HTN, recurrent UTI's, depression, anxiety   Clinical Impression   Pt admitted with above diagnosis and has the deficits listed below. Pt would benefit from a trial of OT to attempt to increase independence with basic adls so she can eventually return home.  Pt does live alone and no family available to identify support system.  Pt was dependent with all care at this time as she is fairly unresponsive.  Pt did come to EOB with total assist.  Pt with twitching in eye lids and only opened eyes one time.  Pt with sustained clonus in all 4 extremities and L gaze.  Pt with grimacing when therapist attempted to bring neck to midline with a roll.  Will follow medically for appropriateness for further OT.     Follow Up Recommendations  SNF;Supervision/Assistance - 24 hour    Equipment Recommendations  Other (comment) (to be determined. No family available)    Recommendations for Other Services       Precautions / Restrictions Precautions Precautions: Fall Restrictions Weight Bearing Restrictions: No      Mobility Bed Mobility Overal bed mobility: Needs Assistance;+ 2 for safety/equipment Bed Mobility: Supine to Sit;Sit to Supine     Supine to sit: Total assist Sit to supine: Total assist   General bed mobility comments: Pt with no active participation.  Transfers Overall transfer level: Needs assistance               General transfer comment: Pt not responsive during inital eval so transfers deferred.    Balance Overall balance assessment: Needs assistance Sitting-balance support: Feet supported Sitting balance-Leahy Scale: Zero Sitting balance - Comments: sat EOB required Max assist to  maintain with very prominent left lateral lean and left head turn preference. No engagement of trunk                                     ADL Overall ADL's : Needs assistance/impaired Eating/Feeding: NPO   Grooming: Total assistance   Upper Body Bathing: Total assistance   Lower Body Bathing: Total assistance   Upper Body Dressing : Total assistance   Lower Body Dressing: Total assistance   Toilet Transfer: Total assistance   Toileting- Clothing Manipulation and Hygiene: Total assistance       Functional mobility during ADLs: Total assistance;+2 for physical assistance General ADL Comments: pt not folling commands.  Pt sat EOB for 5 minutes opening eyes one time but pushing eyes closed repeatedly.  Pt fully dependent at this time.  LP planned for today.     Vision     Perception Perception Perception Tested?: No Comments: unable to assess.   Praxis Praxis Praxis tested?: Not tested    Pertinent Vitals/Pain Pain Assessment: Faces Faces Pain Scale: Hurts little more Pain Location: neck and head movement Pain Descriptors / Indicators: Grimacing Pain Intervention(s): Limited activity within patient's tolerance;Monitored during session;Repositioned     Hand Dominance Right   Extremity/Trunk Assessment Upper Extremity Assessment Upper Extremity Assessment:  (unabl to assess due to not following commads)   Lower Extremity Assessment Lower Extremity Assessment: Defer to PT evaluation       Communication Communication Communication: Other (comment) (pt  not conversiting )   Cognition Arousal/Alertness: Lethargic (minimally responsive to noxious stimuli) Behavior During Therapy:  (minimally responsive) Overall Cognitive Status: Impaired/Different from baseline Area of Impairment: Orientation;Attention;Memory;Following commands;Safety/judgement;Awareness;Problem solving Orientation Level: Disoriented to;Place;Time;Situation Current Attention Level:  Focused Memory: Decreased short-term memory Following Commands:  (did not follow commands) Safety/Judgement: Decreased awareness of safety;Decreased awareness of deficits   Problem Solving: Slow processing;Decreased initiation;Difficulty sequencing;Requires verbal cues;Requires tactile cues General Comments: patient opened eyes breifly with stimuli but could not maintain arousal for any functional time   General Comments       Exercises       Shoulder Instructions      Home Living Family/patient expects to be discharged to:: Skilled nursing facility Living Arrangements: Alone Available Help at Discharge: Family;Available PRN/intermittently Type of Home: House Home Access: Level entry     Home Layout: One level     Bathroom Shower/Tub: Tub/shower unit Shower/tub characteristics: Architectural technologist: Handicapped height         Additional Comments: Per previous encounter      Prior Functioning/Environment Level of Independence: Needs assistance  Gait / Transfers Assistance Needed: Per pt amb modified independent. Reports she doesn't use walker because it makes her fall.          OT Diagnosis: Generalized weakness;Cognitive deficits;Acute pain;Altered mental status   OT Problem List: Decreased strength;Decreased activity tolerance;Impaired balance (sitting and/or standing);Impaired vision/perception;Decreased coordination;Decreased cognition;Decreased safety awareness;Decreased knowledge of use of DME or AE;Decreased knowledge of precautions;Impaired tone   OT Treatment/Interventions: Self-care/ADL training;DME and/or AE instruction;Therapeutic activities;Cognitive remediation/compensation    OT Goals(Current goals can be found in the care plan section) Acute Rehab OT Goals Patient Stated Goal: Not stated OT Goal Formulation: Patient unable to participate in goal setting Time For Goal Achievement: 10/25/15 Potential to Achieve Goals: Fair ADL Goals Additional  ADL Goal #1: Pt will follow 75% of one step commands to increase participation. Additional ADL Goal #2: Pt will sit on EOB for 5 minutes with mod assist in prep for adls on EOB. Additional ADL Goal #3: Pt will engage in one grooming tasks with moderate cues and assist to increase adl independence.  OT Frequency: Min 2X/week   Barriers to D/C:    pt lives alone       Co-evaluation              End of Session Nurse Communication: Mobility status  Activity Tolerance: Patient limited by lethargy Patient left: in bed;with call bell/phone within reach   Time: 0915-0927 OT Time Calculation (min): 12 min Charges:  OT General Charges $OT Visit: 1 Procedure OT Evaluation $OT Eval Moderate Complexity: 1 Procedure G-Codes:    Bertia, Holthus 11/03/2015, 11:02 AM  [phone]

## 2015-10-11 NOTE — Progress Notes (Signed)
Physical Therapy Treatment Patient Details Name: Kathryn [name] MRN: [id number] DOB: 08/09/1931 Today's Date: 10/11/2015    History of Present Illness Pt adm with acute encephalopathy and hallucinations. UTI positive. MRI negative. PMH - chronic abdominal pain, chronic back pain, HTN, recurrent UTI's, depression, anxiety    PT Comments    Patient seen for activity progression. Patient non-verbal and very minimally responsive. Assisted patient to EOB to attempt to elicit arousal. Patient with eyes open initially but could not sustain. Patient with left lateral lean and left head turn preference during EOB. Required maximal support to maintain. No evidence of active engagement of core. Patient did demonstrate some active movement of left UE and bilateral LEs but non-functional and very limited. Sustained clonus noted in all extremities in addition to facial twitching and tremors. Additionally, patient did demonstrate pain response (grimacing with any repositioning and movement of head and neck in bed). At this time, will follow medically for appropriateness to progress.   Follow Up Recommendations  SNF     Equipment Recommendations  Other (comment) (To be determined)    Recommendations for Other Services       Precautions / Restrictions Precautions Precautions: Fall Restrictions Weight Bearing Restrictions: No    Mobility  Bed Mobility Overal bed mobility: Needs Assistance;+ 2 for safety/equipment Bed Mobility: Supine to Sit;Sit to Supine     Supine to sit: Total assist Sit to supine: Total assist      Transfers                    Ambulation/Gait                 Stairs            Wheelchair Mobility    Modified Rankin (Stroke Patients Only)       Balance Overall balance assessment: Needs assistance Sitting-balance support: Feet supported Sitting balance-Leahy Scale: Zero Sitting balance - Comments: sat EOB required Max assist to maintain with  very prominent left lateral lean and left head turn preference. No engagement of trunk                             Cognition Arousal/Alertness: Lethargic (minimally responsive to noxious stimuli) Behavior During Therapy:  (minimally responsive)                   General Comments: patient opened eyes breifly with stimuli but could not maintain arousal for any functional time    Exercises      General Comments        Pertinent Vitals/Pain Pain Assessment: Faces Faces Pain Scale: Hurts little more Pain Location: neck and head movement Pain Descriptors / Indicators: Grimacing Pain Intervention(s): Limited activity within patient's tolerance;Monitored during session;Repositioned    Home Living                      Prior Function            PT Goals (current goals can now be found in the care plan section) Acute Rehab PT Goals PT Goal Formulation: Patient unable to participate in goal setting Time For Goal Achievement: 10/23/15 Potential to Achieve Goals: Good Progress towards PT goals: Not progressing toward goals - comment    Frequency  Min 2X/week    PT Plan Current plan remains appropriate    Co-evaluation  End of Session   Activity Tolerance: Patient limited by lethargy Patient left: in bed;with call bell/phone within reach;with bed alarm set     Time: [id number] PT Time Calculation (min) (ACUTE ONLY): 13 min  Charges:  $Therapeutic Activity: 8-22 mins                    G CodesDuncan Dull 10-26-2015, 10:47 AM Alben Deeds, PT DPT  [phone]

## 2015-10-11 NOTE — Procedures (Signed)
LP Procedure Note:  Patient has been seen and examined.  Chart has been reviewed.  LP is being performed to evaluate for infection or HSV encephalitis.  Procedure has been explained to patient/family including risks and benefits.  Consent has been signed by family and witnessed.   Blood pressure 169/80, pulse 75, temperature 100.7 F (38.2 C), temperature source Axillary, resp. rate 25, height 5\' 3"  (1.6 m), weight 54.6 kg (120 lb 5.9 oz), SpO2 98 %.   Current facility-administered medications:  .  0.9 %  sodium chloride infusion, , Intravenous, Continuous, Ivor Costa, MD, Last Rate: 75 mL/hr at 10/11/15 0834 .  acidophilus (RISAQUAD) capsule 1 capsule, 1 capsule, Oral, Daily, Ivor Costa, MD, 1 capsule at 10/09/15 1219 .  amLODipine (NORVASC) tablet 5 mg, 5 mg, Oral, Daily, Ivor Costa, MD, 5 mg at 10/09/15 1218 .  aspirin EC tablet 81 mg, 81 mg, Oral, Daily, Ivor Costa, MD, 81 mg at 10/09/15 1217 .  calcium-vitamin D (OSCAL WITH D) 500-200 MG-UNIT per tablet 1 tablet, 1 tablet, Oral, Q breakfast, Ivor Costa, MD, 1 tablet at 10/09/15 1218 .  estradiol (CLIMARA - Dosed in mg/24 hr) patch 0.025 mg, 0.025 mg, Transdermal, Weekly, Ivor Costa, MD, 0.025 mg at 10/10/15 1353 .  fluticasone (FLONASE) 50 MCG/ACT nasal spray 2 spray, 2 spray, Each Nare, Daily PRN, Ivor Costa, MD .  hydrALAZINE (APRESOLINE) injection 5 mg, 5 mg, Intravenous, Q2H PRN, Ivor Costa, MD .  levETIRAcetam (KEPPRA) 500 mg in sodium chloride 0.9 % 100 mL IVPB, 500 mg, Intravenous, Q12H, Drema Dallas, DO, 500 mg at 10/11/15 1028 .  levofloxacin (LEVAQUIN) IVPB 750 mg, 750 mg, Intravenous, Q48H, Erenest Blank, RPH, Last Rate: 100 mL/hr at 10/11/15 0440, 750 mg at 10/11/15 0440 .  LORazepam (ATIVAN) injection 0.5 mg, 0.5 mg, Intravenous, Q4H PRN, Ivor Costa, MD, 0.5 mg at 10/10/15 0423 .  losartan (COZAAR) tablet 100 mg, 100 mg, Oral, Daily, Ivor Costa, MD, 100 mg at 10/09/15 1219 .  nystatin-triamcinolone (MYCOLOG II) cream 1 application, 1  application, Topical, BID, Lauren D Bajbus, RPH, 1 application at [id number] .  ondansetron (ZOFRAN) tablet 4 mg, 4 mg, Oral, Q6H PRN **OR** ondansetron (ZOFRAN) injection 4 mg, 4 mg, Intravenous, Q6H PRN, Ivor Costa, MD .  pantoprazole (PROTONIX) EC tablet 40 mg, 40 mg, Oral, q morning - 10a, Ivor Costa, MD, 40 mg at 10/09/15 1218 .  propranolol (INDERAL) tablet 40 mg, 40 mg, Oral, TID, Ivor Costa, MD, 40 mg at 10/09/15 2244 .  simvastatin (ZOCOR) tablet 40 mg, 40 mg, Oral, q morning - 10a, Ivor Costa, MD, 40 mg at 10/09/15 1217 .  sodium chloride flush (NS) 0.9 % injection 3 mL, 3 mL, Intravenous, Q12H, Ivor Costa, MD, 3 mL at 10/10/15 0950 .  traMADol (ULTRAM) tablet 50 mg, 50 mg, Oral, Once, Gardiner Barefoot, NP, 50 mg at 10/10/15 0407 .  vitamin B-12 (CYANOCOBALAMIN) tablet 1,000 mcg, 1,000 mcg, Oral, Daily, Ivor Costa, MD, 1,000 mcg at 10/09/15 1219 .  vitamin C (ASCORBIC ACID) tablet 500 mg, 500 mg, Oral, Daily, Ivor Costa, MD, 500 mg at 10/09/15 1218 .  vitamin E capsule 200 Units, 200 Units, Oral, Daily, Ivor Costa, MD, 200 Units at 10/09/15 1218   Recent Labs  10/09/15 0228 10/09/15 0229 10/10/15 1405  WBC  --  5.6 7.3  HGB  --  11.3* 12.0  HCT  --  33.3* 35.6*  PLT  --  214 215  INR 1.16  --   --  CT of head:FINDINGS: Bony calvarium is intact. Mild atrophic changes are noted. Areas of chronic white matter ischemic change are seen in the deep white matter. Additionally lacunar infarcts are noted adjacent to the right lateral ventricle. These are stable from the prior exam. No acute hemorrhage, acute infarction or space-occupying mass lesion is noted.  IMPRESSION: Chronic atrophic and ischemic changes without acute abnormality.  Patient was placed in the lateral decub/sitting position.  Area was cleaned with betadine and anesthetized with lidocaine.  Under sterile conditions 20G LP needle was placed at approximately L3-4 without difficulty. Approximately 12 cc of clear   fluid was obtained and sent for studies.  No complications were noted.     Jim Like, DO [facility] [phone]  If 7pm- 7am, please page neurology on call as listed in [place].

## 2015-10-11 NOTE — Progress Notes (Addendum)
PROGRESS NOTE  Kathryn [name] [id number] DOB: 11/17/1930 DOA: 10/08/2015 PCP: Lilian Coma, MD  HPI: Kathryn [name] is an 82 y.o. female with a PMH of hypertension, hyperlipidemia, GERD, hypothyroidism, depression/anxiety, TIA/stroke, recurrent UTI, chronic back and abdominal pain who was admitted 10/08/15 with altered mental status and hallucinations.   Subjective / 24 H Interval events - unresponsive this morning, agitated overnight and more alert per RN.  - fever to 100.7 overnight  Assessment/Plan: Principal Problem:   Acute encephalopathy Active Problems:   Spinal stenosis, lumbar region, with neurogenic claudication   Generalized anxiety disorder   GERD (gastroesophageal reflux disease)   Essential hypertension, benign   Recurrent falls   History of stroke   Polypharmacy   Generalized ischemic cerebrovascular disease   Chronic abdominal pain   Status epilepticus ([facility])   Acute encephalopathy due to non convulsive status epilepticus  - Initially felt to be polypharmacy from anticholinergics and anxiolytics however EEG which showed active seizures and likely status epilepticus  - ABG unremarkable and labs reviewed, no evidence of significant metabolic disturbance, TSH and ammonia normal. CT head on admission negative and MRI with no acute intracranial process - D/w Neurology this morning, fiven fever plan for LP and given ongoing AMS and concern for ongoign seizures needs continuous EEG monitoring   UTI - on Levaquin  - culture with diphteroids ?contaminant - given fever will continue, awaiting LP however  Generalized anxiety disorder - Home regimen includes PRN clonazepam, valium  Essential Hypertension - Home meds (Amlodipine, losartan) on hold until mentation has improved, unable to reliably take PO meds at this time - IV hydralazine PRN  History of CVA - Resume ASA and statin when mentation improved and able to take PO meds  Hyperlipidemia - Hold statin at  this time, see above  Chronic abdominal pain - Extensive workup in the past with no etiology found. - Zofran as needed for nausea, will resume PPI when taking PO. - Lipase and LFTs normal   Diet: Diet Heart Room service appropriate?: Yes; Fluid consistency:: Thin Fluids: NS 24ml/hr DVT Prophylaxis: Lovenox  Code Status: Full Code Family Communication: d/w son at bedside Disposition Plan: remain in stepdown Barriers to discharge: critical illness, status epilepticus  Consultants:  Neurology   Procedures:  EEG - called for initial results showing status epilepticus, final report pending.   Antibiotics  Anti-infectives    Start     Dose/Rate Route Frequency Ordered Stop   10/09/15 0400  levofloxacin (LEVAQUIN) IVPB 750 mg     750 mg 100 mL/hr over 90 Minutes Intravenous Every 48 hours 10/09/15 0358         Studies  Ct Head Wo Contrast  10/10/2015  CLINICAL DATA:  Recent seizure activity EXAM: CT HEAD WITHOUT CONTRAST TECHNIQUE: Contiguous axial images were obtained from the base of the skull through the vertex without intravenous contrast. COMPARISON:  10/08/2015, 10/09/2015 FINDINGS: Bony calvarium is intact. Mild atrophic changes are noted. Areas of chronic white matter ischemic change are seen in the deep white matter. Additionally lacunar infarcts are noted adjacent to the right lateral ventricle. These are stable from the prior exam. No acute hemorrhage, acute infarction or space-occupying mass lesion is noted. IMPRESSION: Chronic atrophic and ischemic changes without acute abnormality. Electronically Signed   By: Inez Catalina M.D.   On: 10/10/2015 12:10    Objective  Filed Vitals:   10/11/15 0400 10/11/15 0600 10/11/15 0800 10/11/15 1000  BP: 169/76 159/68 165/77 169/80  Pulse: 66  77 75  Temp: 100.7 F (38.2 C)     TempSrc: Axillary     Resp: 16 18 15 25   Height:      Weight:      SpO2: 100%  97% 98%    Intake/Output Summary (Last 24 hours) at 10/11/15  1134 Last data filed at 10/11/15 1028  Gross per 24 hour  Intake 2049.5 ml  Output   1750 ml  Net  299.5 ml   Filed Weights   10/09/15 1500  Weight: 54.6 kg (120 lb 5.9 oz)   Exam:  GENERAL: opens eyes, maintains rightward gaze, nonverbal at present  HEENT: pupils unequal R>L minimally reactive to light, no scleral icterus  NECK: supple  LUNGS: CTA biL, no wheezing, respirations nonlabored  HEART: RRR without MRG  ABDOMEN: soft, non tender, no distended  EXTREMITIES: no clubbing / cyanosis, warm to touch, no edema  NEUROLOGIC: moves all 4 extremities spontaneously but does not follow commands, no response to painful stimuli  SKIN: no rashes  Data Reviewed: Basic Metabolic Panel:  Recent Labs Lab 10/05/15 1504 10/08/15 2059 10/09/15 0229 10/10/15 1405  NA 133* 132* 134* 136  K 3.7 3.8 3.7 3.3*  CL 96* 93* 97* 101  CO2 27 24 23 26   GLUCOSE 92 112* 97 98  BUN 9 13 11 7   CREATININE 0.61 0.81 0.70 0.72  CALCIUM 9.8 10.3 10.3 9.8  MG  --   --   --  1.4*   Liver Function Tests:  Recent Labs Lab 10/05/15 1504 10/08/15 2059 10/10/15 1405  AST 25 26 25   ALT 12* 14 14  ALKPHOS 51 48 47  BILITOT 0.9 1.0 1.1  PROT 7.5 6.8 6.6  ALBUMIN 4.3 4.1 3.6    Recent Labs Lab 10/05/15 1504 10/09/15 0228  LIPASE 25 35    Recent Labs Lab 10/08/15 2100  AMMONIA 16   CBC:  Recent Labs Lab 10/05/15 1504 10/08/15 2059 10/09/15 0229 10/10/15 1405  WBC 3.7* 4.9 5.6 7.3  NEUTROABS 1.6*  --   --   --   HGB 12.2 11.9* 11.3* 12.0  HCT 36.4 35.4* 33.3* 35.6*  MCV 91.2 89.6 89.0 89.9  PLT 218 214 214 215   Cardiac Enzymes:  Recent Labs Lab 10/09/15 0035  CKTOTAL 79   BNP (last 3 results)  Recent Labs  10/09/15 0228  BNP 64.5   CBG:  Recent Labs Lab 10/08/15 2059 10/09/15 0854 10/10/15 0739  GLUCAP 110* 100* 90   Recent Results (from the past 240 hour(s))  Urine culture     Status: None   Collection Time: 10/05/15  2:27 PM  Result Value  Ref Range Status   Specimen Description URINE, CATHETERIZED  Final   Special Requests Normal  Final   Culture   Final    NO GROWTH 2 DAYS Performed at [facility]    Report Status 10/07/2015 FINAL  Final  Urine culture     Status: None   Collection Time: 10/09/15  1:43 AM  Result Value Ref Range Status   Specimen Description URINE, CATHETERIZED  Final   Special Requests NONE  Final   Culture   Final    >=100,000 COLONIES/mL DIPHTHEROIDS(CORYNEBACTERIUM SPECIES) Standardized susceptibility testing for this organism is not available.    Report Status 10/10/2015 FINAL  Final  Culture, blood (Routine X 2) w Reflex to ID Panel     Status: None (Preliminary result)   Collection Time: 10/09/15  5:30 AM  Result Value  Ref Range Status   Specimen Description BLOOD LEFT HAND  Final   Special Requests IN PEDIATRIC BOTTLE 1CC  Final   Culture NO GROWTH 1 DAY  Final   Report Status PENDING  Incomplete  Culture, blood (Routine X 2) w Reflex to ID Panel     Status: None (Preliminary result)   Collection Time: 10/09/15  5:38 AM  Result Value Ref Range Status   Specimen Description BLOOD LEFT ARM  Final   Special Requests IN PEDIATRIC BOTTLE 1CC  Final   Culture NO GROWTH 1 DAY  Final   Report Status PENDING  Incomplete  MRSA PCR Screening     Status: None   Collection Time: 10/10/15 11:39 PM  Result Value Ref Range Status   MRSA by PCR NEGATIVE NEGATIVE Final    Comment:        The GeneXpert MRSA Assay (FDA approved for NASAL specimens only), is one component of a comprehensive MRSA colonization surveillance program. It is not intended to diagnose MRSA infection nor to guide or monitor treatment for MRSA infections.     Scheduled Meds: . acidophilus  1 capsule Oral Daily  . amLODipine  5 mg Oral Daily  . aspirin EC  81 mg Oral Daily  . calcium-vitamin D  1 tablet Oral Q breakfast  . estradiol  0.025 mg Transdermal Weekly  . levETIRAcetam  500 mg Intravenous Q12H  .  levofloxacin (LEVAQUIN) IV  750 mg Intravenous Q48H  . losartan  100 mg Oral Daily  . nystatin-triamcinolone  1 application Topical BID  . pantoprazole  40 mg Oral q morning - 10a  . propranolol  40 mg Oral TID  . simvastatin  40 mg Oral q morning - 10a  . sodium chloride flush  3 mL Intravenous Q12H  . traMADol  50 mg Oral Once  . vitamin B-12  1,000 mcg Oral Daily  . vitamin C  500 mg Oral Daily  . vitamin E  200 Units Oral Daily   Continuous Infusions: . sodium chloride 75 mL/hr at 10/11/15 [id number]    Time spent coordinating care: > 35 minutes, greater than 50% of this time was spent bedside evaluating the patient, family discussions and discussing with neurology.   Marzetta Board, MD [facility] Pager [phone]. If 7 PM - 7 AM, please contact night-coverage at [email or web address], password [facility] 10/11/2015, 11:34 AM  LOS: 2 days

## 2015-10-12 ENCOUNTER — Inpatient Hospital Stay (HOSPITAL_COMMUNITY): Payer: Medicare Other

## 2015-10-12 DIAGNOSIS — G40909 Epilepsy, unspecified, not intractable, without status epilepticus: Secondary | ICD-10-CM

## 2015-10-12 DIAGNOSIS — J9601 Acute respiratory failure with hypoxia: Secondary | ICD-10-CM

## 2015-10-12 DIAGNOSIS — J189 Pneumonia, unspecified organism: Secondary | ICD-10-CM

## 2015-10-12 DIAGNOSIS — G40901 Epilepsy, unspecified, not intractable, with status epilepticus: Secondary | ICD-10-CM

## 2015-10-12 LAB — COMPREHENSIVE METABOLIC PANEL
ALK PHOS: 43 U/L (ref 38–126)
ALT: 10 U/L — AB (ref 14–54)
ALT: 12 U/L — ABNORMAL LOW (ref 14–54)
ANION GAP: 11 (ref 5–15)
AST: 21 U/L (ref 15–41)
AST: 26 U/L (ref 15–41)
Albumin: 3 g/dL — ABNORMAL LOW (ref 3.5–5.0)
Albumin: 3.2 g/dL — ABNORMAL LOW (ref 3.5–5.0)
Alkaline Phosphatase: 40 U/L (ref 38–126)
Anion gap: 10 (ref 5–15)
BUN: 9 mg/dL (ref 6–20)
BUN: 9 mg/dL (ref 6–20)
CALCIUM: 9 mg/dL (ref 8.9–10.3)
CHLORIDE: 95 mmol/L — AB (ref 101–111)
CO2: 26 mmol/L (ref 22–32)
CO2: 24 mmol/L (ref 22–32)
CREATININE: 0.57 mg/dL (ref 0.44–1.00)
Calcium: 9 mg/dL (ref 8.9–10.3)
Chloride: 93 mmol/L — ABNORMAL LOW (ref 101–111)
Creatinine, Ser: 0.78 mg/dL (ref 0.44–1.00)
GFR calc Af Amer: >60 mL/min (ref >60)
GFR calc non Af Amer: >60 mL/min (ref >60)
GLUCOSE: 129 mg/dL — AB (ref 65–99)
GLUCOSE: 163 mg/dL — AB (ref 65–99)
Potassium: 2.3 mmol/L — CL (ref 3.5–5.1)
Potassium: 3.4 mmol/L — ABNORMAL LOW (ref 3.5–5.1)
Sodium: 131 mmol/L — ABNORMAL LOW (ref 135–145)
Sodium: 128 mmol/L — ABNORMAL LOW (ref 135–145)
TOTAL PROTEIN: 6.2 g/dL — AB (ref 6.5–8.1)
Total Bilirubin: 0.8 mg/dL (ref 0.3–1.2)
Total Bilirubin: 1.1 mg/dL (ref 0.3–1.2)
Total Protein: 5.8 g/dL — ABNORMAL LOW (ref 6.5–8.1)

## 2015-10-12 LAB — BLOOD GAS, ARTERIAL
Acid-base deficit: 0.1 mmol/L (ref 0.0–2.0)
Bicarbonate: 22.5 mEq/L (ref 20.0–24.0)
FIO2: 100
LHR: 16 {breaths}/min
MECHVT: 450 mL
O2 Saturation: 100 %
PATIENT TEMPERATURE: 98.6
PCO2 ART: 27.8 mmHg — AB (ref 35.0–45.0)
PEEP/CPAP: 5 cmH2O
PO2 ART: 543 mmHg — AB (ref 80.0–100.0)
TCO2: 23.4 mmol/L (ref 0–100)
pH, Arterial: 7.52 — ABNORMAL HIGH (ref 7.350–7.450)

## 2015-10-12 LAB — GLUCOSE, CAPILLARY
GLUCOSE-CAPILLARY: 123 mg/dL — AB (ref 65–99)
GLUCOSE-CAPILLARY: 117 mg/dL — AB (ref 65–99)
Glucose-Capillary: 104 mg/dL — ABNORMAL HIGH (ref 65–99)
Glucose-Capillary: 133 mg/dL — ABNORMAL HIGH (ref 65–99)

## 2015-10-12 LAB — TRIGLYCERIDES: Triglycerides: 46 mg/dL (ref <150)

## 2015-10-12 LAB — CBC
HCT: 32.1 % — ABNORMAL LOW (ref 36.0–46.0)
Hemoglobin: 11.1 g/dL — ABNORMAL LOW (ref 12.0–15.0)
MCH: 30.2 pg (ref 26.0–34.0)
MCHC: 34.6 g/dL (ref 30.0–36.0)
MCV: 87.5 fL (ref 78.0–100.0)
PLATELETS: 201 10*3/uL (ref 150–400)
RBC: 3.67 MIL/uL — ABNORMAL LOW (ref 3.87–5.11)
RDW: 12.1 % (ref 11.5–15.5)
WBC: 6.1 10*3/uL (ref 4.0–10.5)

## 2015-10-12 LAB — HERPES SIMPLEX VIRUS(HSV) DNA BY PCR
HSV 1 DNA: NEGATIVE
HSV 2 DNA: NEGATIVE

## 2015-10-12 LAB — MAGNESIUM: Magnesium: 1.5 mg/dL — ABNORMAL LOW (ref 1.7–2.4)

## 2015-10-12 LAB — POTASSIUM: POTASSIUM: 3.3 mmol/L — AB (ref 3.5–5.1)

## 2015-10-12 MED ORDER — FENTANYL CITRATE (PF) 100 MCG/2ML IJ SOLN
100.0000 ug | Freq: Once | INTRAMUSCULAR | Status: AC
  Administered 2015-10-12: 100 ug via INTRAVENOUS

## 2015-10-12 MED ORDER — SIMVASTATIN 40 MG PO TABS
40.0000 mg | ORAL_TABLET | Freq: Every morning | ORAL | Status: DC
Start: 2015-10-13 — End: 2015-11-01
  Administered 2015-10-13 – 2015-11-01 (×16): 40 mg
  Filled 2015-10-12 (×16): qty 1

## 2015-10-12 MED ORDER — BISACODYL 10 MG RE SUPP: 10.0000 mg | Freq: Every day | RECTAL | Status: DC | PRN

## 2015-10-12 MED ORDER — VITAMIN B-12 1000 MCG PO TABS
1000.0000 ug | ORAL_TABLET | Freq: Every day | ORAL | Status: DC
Start: 2015-10-13 — End: 2015-11-01
  Administered 2015-10-13 – 2015-11-01 (×16): 1000 ug
  Filled 2015-10-12 (×17): qty 1

## 2015-10-12 MED ORDER — SODIUM CHLORIDE 0.9 % IV SOLN
100.0000 mg | Freq: Two times a day (BID) | INTRAVENOUS | Status: DC
  Administered 2015-10-12 – 2015-10-15 (×6): 100 mg via INTRAVENOUS
  Filled 2015-10-12 (×12): qty 10

## 2015-10-12 MED ORDER — PROPOFOL 1000 MG/100ML IV EMUL
0.0000 ug/kg/min | INTRAVENOUS | Status: DC
  Administered 2015-10-12: 10 ug/kg/min via INTRAVENOUS
  Administered 2015-10-13 – 2015-10-14 (×3): 40 ug/kg/min via INTRAVENOUS
  Filled 2015-10-12: qty 200
  Filled 2015-10-12 (×3): qty 100

## 2015-10-12 MED ORDER — SODIUM CHLORIDE 0.9 % IV SOLN
INTRAVENOUS | Status: DC
  Administered 2015-10-12 – 2015-10-13 (×2): via INTRAVENOUS

## 2015-10-12 MED ORDER — ETOMIDATE 2 MG/ML IV SOLN
20.0000 mg | Freq: Once | INTRAVENOUS | Status: AC
  Administered 2015-10-12: 20 mg via INTRAVENOUS

## 2015-10-12 MED ORDER — VITAMIN E 100 UNT/0.25ML PO OIL
200.0000 [IU] | TOPICAL_OIL | Freq: Every day | ORAL | Status: DC
  Administered 2015-10-13 – 2015-11-01 (×16): 200 [IU]
  Filled 2015-10-12 (×20): qty 0.5

## 2015-10-12 MED ORDER — MIDAZOLAM HCL 2 MG/2ML IJ SOLN
2.0000 mg | Freq: Once | INTRAMUSCULAR | Status: AC
  Administered 2015-10-12: 2 mg via INTRAVENOUS

## 2015-10-12 MED ORDER — MIDAZOLAM HCL 2 MG/2ML IJ SOLN: 1.0000 mg | INTRAMUSCULAR | Status: DC | PRN

## 2015-10-12 MED ORDER — SODIUM CHLORIDE 0.9 % IV SOLN
500.0000 mg | Freq: Once | INTRAVENOUS | Status: AC
  Administered 2015-10-12: 500 mg via INTRAVENOUS
  Filled 2015-10-12: qty 5

## 2015-10-12 MED ORDER — DEXTROSE 5 % IV SOLN
1.0000 g | Freq: Two times a day (BID) | INTRAVENOUS | Status: DC
  Administered 2015-10-12 – 2015-10-14 (×4): 1 g via INTRAVENOUS
  Filled 2015-10-12 (×5): qty 1

## 2015-10-12 MED ORDER — SODIUM CHLORIDE 0.9 % IV SOLN
1000.0000 mg | Freq: Two times a day (BID) | INTRAVENOUS | Status: DC
Start: 2015-10-12 — End: 2015-10-15
  Administered 2015-10-13 – 2015-10-14 (×5): 1000 mg via INTRAVENOUS
  Filled 2015-10-12 (×9): qty 10

## 2015-10-12 MED ORDER — FENTANYL CITRATE (PF) 100 MCG/2ML IJ SOLN
50.0000 ug | INTRAMUSCULAR | Status: DC | PRN
  Administered 2015-10-16 – 2015-10-21 (×3): 50 ug via INTRAVENOUS
  Filled 2015-10-12 (×3): qty 2

## 2015-10-12 MED ORDER — PANTOPRAZOLE SODIUM 40 MG PO PACK
40.0000 mg | PACK | ORAL | Status: DC
  Administered 2015-10-12 – 2015-10-24 (×13): 40 mg
  Filled 2015-10-12 (×14): qty 20

## 2015-10-12 MED ORDER — POTASSIUM CHLORIDE 10 MEQ/100ML IV SOLN
10.0000 meq | INTRAVENOUS | Status: AC
  Administered 2015-10-12: 10 meq via INTRAVENOUS
  Filled 2015-10-12 (×2): qty 100

## 2015-10-12 MED ORDER — POTASSIUM CHLORIDE 2 MEQ/ML IV SOLN
INTRAVENOUS | Status: DC
  Administered 2015-10-12: 09:00:00 via INTRAVENOUS
  Filled 2015-10-12 (×3): qty 1000

## 2015-10-12 MED ORDER — MAGNESIUM SULFATE 2 GM/50ML IV SOLN
2.0000 g | Freq: Once | INTRAVENOUS | Status: AC
  Administered 2015-10-12: 2 g via INTRAVENOUS
  Filled 2015-10-12: qty 50

## 2015-10-12 MED ORDER — ENOXAPARIN SODIUM 40 MG/0.4ML ~~LOC~~ SOLN
40.0000 mg | SUBCUTANEOUS | Status: DC
  Administered 2015-10-12 – 2015-11-03 (×23): 40 mg via SUBCUTANEOUS
  Filled 2015-10-12 (×23): qty 0.4

## 2015-10-12 MED ORDER — VITAL HIGH PROTEIN PO LIQD
1000.0000 mL | ORAL | Status: DC
  Administered 2015-10-12: 1000 mL
  Administered 2015-10-13: 09:00:00

## 2015-10-12 MED ORDER — POTASSIUM CHLORIDE 10 MEQ/100ML IV SOLN
10.0000 meq | INTRAVENOUS | Status: AC
  Administered 2015-10-12 (×3): 10 meq via INTRAVENOUS
  Filled 2015-10-12 (×2): qty 100

## 2015-10-12 MED ORDER — ANTISEPTIC ORAL RINSE SOLUTION (CORINZ)
7.0000 mL | OROMUCOSAL | Status: DC
  Administered 2015-10-12 – 2015-10-30 (×165): 7 mL via OROMUCOSAL

## 2015-10-12 MED ORDER — ACETAMINOPHEN 160 MG/5ML PO SOLN
650.0000 mg | Freq: Four times a day (QID) | ORAL | Status: DC | PRN
  Administered 2015-10-28 – 2015-11-02 (×2): 650 mg via ORAL
  Filled 2015-10-12 (×4): qty 20.3

## 2015-10-12 MED ORDER — CHLORHEXIDINE GLUCONATE 0.12% ORAL RINSE (MEDLINE KIT)
15.0000 mL | Freq: Two times a day (BID) | OROMUCOSAL | Status: DC
  Administered 2015-10-12 – 2015-11-02 (×42): 15 mL via OROMUCOSAL

## 2015-10-12 MED ORDER — SODIUM CHLORIDE 0.9 % IV SOLN
500.0000 mg | Freq: Two times a day (BID) | INTRAVENOUS | Status: DC
  Administered 2015-10-12 – 2015-10-13 (×2): 500 mg via INTRAVENOUS
  Filled 2015-10-12 (×4): qty 500

## 2015-10-12 MED ORDER — SENNOSIDES 8.8 MG/5ML PO SYRP
5.0000 mL | ORAL_SOLUTION | Freq: Two times a day (BID) | ORAL | Status: DC | PRN
  Administered 2015-10-15 – 2015-10-25 (×2): 5 mL
  Filled 2015-10-12 (×4): qty 5

## 2015-10-12 MED ORDER — INSULIN ASPART 100 UNIT/ML ~~LOC~~ SOLN
0.0000 [IU] | SUBCUTANEOUS | Status: DC
  Administered 2015-10-13 – 2015-10-31 (×18): 2 [IU] via SUBCUTANEOUS
  Administered 2015-10-31: 3 [IU] via SUBCUTANEOUS

## 2015-10-12 MED ORDER — VITAMIN C 500 MG PO TABS
500.0000 mg | ORAL_TABLET | Freq: Every day | ORAL | Status: DC
  Administered 2015-10-13 – 2015-11-01 (×16): 500 mg
  Filled 2015-10-12 (×16): qty 1

## 2015-10-12 NOTE — Progress Notes (Signed)
Initial Nutrition Assessment  DOCUMENTATION CODES:   Not applicable  INTERVENTION:   Advance diet as medically appropriate, add supplements as able  NUTRITION DIAGNOSIS:   Inadequate oral intake related to inability to eat as evidenced by NPO status   GOAL:   Patient will meet greater than or equal to 90% of their needs  MONITOR:   Diet advancement, PO intake, Labs, Weight trends, Skin  REASON FOR ASSESSMENT:   Low Braden  ASSESSMENT:   [name] with PMH of HTN, hyperlipidemia, GERD, hypothyroidism, depression, anxiety, TIA, stroke, recurrent UTI, chronic back pain, chronic abdominal pain, who presented with altered mental status and hallucination.  Patient sleeping soundly upon RD visit.  Did not wake.  Per H&P patient was found on the ground by [employer] which RD suspects she is a member of.  Question whether patient will need short term nutrition support.  Low braden score places patient at risk for skin breakdown.  RD unable to complete Nutrition Focused Physical Exam at this time.  Diet Order:  Diet NPO time specified  Skin:  Reviewed, no issues  Last BM:  N/A  Height:   Ht Readings from Last 1 Encounters:  10/09/15 5\' 3"  (1.6 m)    Weight:   Wt Readings from Last 1 Encounters:  10/12/15 130 lb 15.3 oz (59.4 kg)    Ideal Body Weight:  52 kg  BMI:  Body mass index is 23.2 kg/(m^2).  Estimated Nutritional Needs:   Kcal:  1300-1500  Protein:  65-75 gm  Fluid:  >/= 1.5 L  EDUCATION NEEDS:   No education needs identified at this time  Arthur Holms, RD, LDN Pager #: [phone] After-Hours Pager #: [phone]

## 2015-10-12 NOTE — Consult Note (Signed)
PULMONARY / CRITICAL CARE MEDICINE   Name: Kathryn [name] MRN: [id number] DOB: 1930-10-10    ADMISSION DATE:  10/08/2015 CONSULTATION DATE:  10/12/2015  REFERRING MD:  Dr. Janann Colonel, neurology  CHIEF COMPLAINT:  Seizures.  HISTORY OF PRESENT ILLNESS:   Hx from chart.  82 yo female brought to ER due to confusion after falling.  She was also having visual hallucinations.  She was noted to have seizure on 2/08 and neurology consulted.  She was also noted to have fever.  She had LP that was unrevealing.  She was started on abx for possible UTI, and acyclovir for possible encephalitis.  Her seizures persistent, and neuro recommend transfer to ICU for diprivan coma and intubation.  PAST MEDICAL HISTORY :  She  has a past medical history of Depression; Hypertension; High cholesterol; Rectal prolapse; adenomatous colonic polyps; Internal hemorrhoids; Hypothyroidism; Fecal incontinence; TIA (transient ischemic attack); History of frequent urinary tract infections; Migraine; Fatty liver (03/20/13); Generalized ischemic cerebrovascular disease (10/09/2015); and Stroke [facility]) (Sept 1, 2010; Sept 12, 2011).  PAST SURGICAL HISTORY: She  has past surgical history that includes Colon surgery (2010, for prolapsed organs after hystecrtomy surgery); Appendectomy (2008); Cholecystectomy; Lumbar laminectomy/decompression microdiscectomy (N/A, 01/07/2013); Back surgery; Hemorrhoid surgery (09/2008); and Abdominal hysterectomy (05/2007).  Allergies  Allergen Reactions  . Augmentin [Amoxicillin-Pot Clavulanate] Swelling and Diarrhea    Throat   . Citalopram Swelling    Eyes ears and throat swelling Throat and eyes   . Codeine Anaphylaxis and Shortness Of Breath  . Fish-Derived Products Anaphylaxis  . Hyoscyamine Anaphylaxis and Swelling  . Ibuprofen Swelling    Throat and eyes   . Latex Swelling and Rash    Swelling to troat  . Lipitor [Atorvastatin] Swelling    Muscle aches throat  . Septra [Bactrim] Anaphylaxis   . Shellfish Allergy Anaphylaxis  . Miconazole Rash  . Keflet [Cephalexin] Diarrhea    Upset stomach  . Meloxicam Diarrhea and Nausea And Vomiting  . Sulfa Antibiotics     Rash and also throat was tight  . Verapamil     Tachycardia and flushing  . Vicodin [Hydrocodone-Acetaminophen] Other (See Comments)    stroke  . Zoloft [Sertraline Hcl] Swelling    Red spots all over face, swelling of tongue and legs.   . Ciprofloxacin Nausea And Vomiting    Other reaction(s): Abdominal Pain  . Depakene [Valproate Sodium] Hives    All over body   . Isoptin Sr [Verapamil Hcl Er] Cough  . Lisinopril Cough    Fatigue and cough  . Telmisartan-Hctz Cough    No current facility-administered medications on file prior to encounter.   Current Outpatient Prescriptions on File Prior to Encounter  Medication Sig  . amLODipine (NORVASC) 2.5 MG tablet Take 5 mg by mouth daily.   Marland Kitchen aspirin EC 81 MG tablet Take 81 mg by mouth daily.  . Calcium Carbonate-Vitamin D (CALCIUM + D PO) Take 1 tablet by mouth daily. Reported on 10/08/2015  . clonazePAM (KLONOPIN) 1 MG tablet Take 0.5-1 mg by mouth at bedtime as needed for anxiety. Reported on 10/05/2015  . glycopyrrolate ([name]) 2 MG tablet TAKE ONE TABLET BY MOUTH TWICE DAILY  . losartan (COZAAR) 100 MG tablet Take 100 mg by mouth daily.  Marland Kitchen PRESCRIPTION MEDICATION Pt gets allergy shots once weekly. Dr Donneta Romberg.  . Probiotic Product (PROBIOTIC DAILY PO) Take 1 tablet by mouth daily.  . vitamin B-12 (CYANOCOBALAMIN) 100 MCG tablet Take 100 mcg by mouth daily.  . vitamin  C (ASCORBIC ACID) 500 MG tablet Take 500 mg by mouth daily.  . vitamin E 200 UNIT capsule Take 200 Units by mouth daily.   . diazepam (VALIUM) 2 MG tablet Take 2 mg by mouth every 6 (six) hours as needed for anxiety. Reported on 10/05/2015  . doxycycline (VIBRAMYCIN) 100 MG capsule Take 1 capsule (100 mg total) by mouth 2 (two) times daily. (Patient not taking: Reported on 10/08/2015)  . estradiol  (VIVELLE-DOT) 0.0375 MG/24HR Place 0.5 patches onto the skin 2 (two) times a week. Sunday and Wed  . fluticasone (FLONASE) 50 MCG/ACT nasal spray Place 2 sprays into the nose daily as needed for allergies.   Marland Kitchen nystatin-triamcinolone (MYCOLOG II) cream Apply 1 application topically 2 (two) times daily.  . pantoprazole (PROTONIX) 40 MG tablet Take 1 tablet (40 mg total) by mouth every morning.  . propranolol (INDERAL) 40 MG tablet Take 40 mg by mouth 3 (three) times daily.  . simvastatin (ZOCOR) 40 MG tablet Take 40 mg by mouth every morning.   . traMADol-acetaminophen (ULTRACET) 37.5-325 MG per tablet Take 1 tablet by mouth every 8 (eight) hours as needed. (Patient not taking: Reported on 10/08/2015)    FAMILY HISTORY:  Her indicated that her mother is deceased. She indicated that her father is deceased.   SOCIAL HISTORY: She  reports that she has never smoked. She has never used smokeless tobacco. She reports that she does not drink alcohol or use illicit drugs.  REVIEW OF SYSTEMS:   Unable to obtain due to mental status  SUBJECTIVE:   VITAL SIGNS: BP 161/68 mmHg  Pulse 81  Temp(Src) 100.5 F (38.1 C) (Axillary)  Resp 24  Ht 5\' 3"  (1.6 m)  Wt 130 lb 15.3 oz (59.4 kg)  BMI 23.20 kg/m2  SpO2 100%  HEMODYNAMICS:    VENTILATOR SETTINGS:    INTAKE / OUTPUT: I/O last 3 completed shifts: In: 3097.5 [I.V.:2432.5; IV Piggyback:665] Out: 2900 [Urine:2900]  PHYSICAL EXAMINATION: General:  Ill appearing Neuro:  seizing HEENT:  Rt ward gaze Cardiovascular:  Regular, no murmur Lungs:  B/l rhonchi Abdomen:  Soft, nontender Musculoskeletal:  No edema Skin:  No rashes  LABS:  BMET  Recent Labs Lab 10/09/15 0229 10/10/15 1405 10/12/15 0400  NA 134* 136 131*  K 3.7 3.3* 2.3*  CL 97* 101 95*  CO2 23 26 26   BUN 11 7 9   CREATININE 0.70 0.72 0.57  GLUCOSE 97 98 129*    Electrolytes  Recent Labs Lab 10/09/15 0229 10/10/15 1405 10/12/15 0400  CALCIUM 10.3 9.8 9.0   MG  --  1.4* 1.5*    CBC  Recent Labs Lab 10/09/15 0229 10/10/15 1405 10/12/15 0400  WBC 5.6 7.3 6.1  HGB 11.3* 12.0 11.1*  HCT 33.3* 35.6* 32.1*  PLT 214 215 201    Coag's  Recent Labs Lab 10/09/15 0228  APTT 29  INR 1.16    Sepsis Markers No results for input(s): LATICACIDVEN, PROCALCITON, O2SATVEN in the last 168 hours.  ABG  Recent Labs Lab 10/10/15 0925  PHART 7.466*  PCO2ART 31.7*  PO2ART 76.8*    Liver Enzymes  Recent Labs Lab 10/08/15 2059 10/10/15 1405 10/12/15 0400  AST 26 25 21   ALT 14 14 10*  ALKPHOS 48 47 40  BILITOT 1.0 1.1 0.8  ALBUMIN 4.1 3.6 3.0*    Cardiac Enzymes No results for input(s): TROPONINI, PROBNP in the last 168 hours.  Glucose  Recent Labs Lab 10/09/15 0854 10/10/15 0739 10/11/15 1309 10/11/15 1803  10/12/15 0022 10/12/15 0700  GLUCAP 100* 90 70 98 104* 123*    Imaging No results found.   STUDIES:  2/06 CT head >> atrophy 2/07 MRI brain >> remote basal ganglia and corona radiata infarcts b/l, remote Rt thalamus and posterior limb internal capsule infarcts, global atrophy 2/08 EEG >> lt temporal region focal seizures, lt hemisphere slowing 2/09 LP >> 140 RBC, 2 WBC, protein 38, glucose 51  CULTURES: 2/07 Urine >> diphtheroids 2/07 Blood >> 2/09 CSF >>  ANTIBIOTICS: 2/06 Levaquin >> 2/09 Acyclovir >>   SIGNIFICANT EVENTS: 2/06 Admit 2/08 Noted to have seizure, neuro consulted 2/10 Status epilepticus >> to ICU, diprivan coma  LINES/TUBES: 2/10 ETT >>   DISCUSSION: 82 yo female with acute encephalopathy and status epilepticus.  She has hx of CVA's and polypharmacy.  She required intubation for diprivan coma to control seizures.  ASSESSMENT / PLAN:  PULMONARY A: Acute respiratory failure in setting of status epilepticus. P:   Full vent support F/u CXR, ABG  CARDIOVASCULAR A:  Hx of HTN, HLD. P:  Continue zocor Monitor hemodynamics  RENAL A:   Hypokalemia. Hyponatremia. P:    Replace electrolytes as needed  GASTROINTESTINAL A:   Nutrition. Chronic abdominal pain. P:   Tube feeds while on vent Protonix for SUP  HEMATOLOGIC A:   No acute issues. P:  F/u CBC Lovenox for DVT prevention  INFECTIOUS A:   ?UTI. ?Encephalitis. P:   Abx as above  ENDOCRINE A:   No acute issues. P:   Monitor CBGs  NEUROLOGIC A:   Acute encephalopathy 2nd to status epilepticus. Hx of anxiety, depression on chronic benzo's. Hx of CVA. P:   AEDs per neurology  D/w Dr. Janann Colonel.  CC time 42 minutes.  Chesley Mires, MD [facility] Pulmonary/Critical Care 10/12/2015, 6:45 PM Pager:  [phone] After 3pm call: [phone]

## 2015-10-12 NOTE — Progress Notes (Signed)
PROGRESS NOTE  Kathryn [name] [id number] DOB: Jan 07, 1931 DOA: 10/08/2015 PCP: Lilian Coma, MD  HPI: Kathryn [name] is an 82 y.o. female with a PMH of hypertension, hyperlipidemia, GERD, hypothyroidism, depression/anxiety, TIA/stroke, recurrent UTI, chronic back and abdominal pain who was admitted 10/08/15 with altered mental status and hallucinations.   Subjective / 24 H Interval events - unresponsive in am, more alert around lunch time tracking me however non verbal   Assessment/Plan: Principal Problem:   Acute encephalopathy Active Problems:   Spinal stenosis, lumbar region, with neurogenic claudication   Generalized anxiety disorder   GERD (gastroesophageal reflux disease)   Essential hypertension, benign   Recurrent falls   History of stroke   Polypharmacy   Generalized ischemic cerebrovascular disease   Chronic abdominal pain   Status epilepticus ([place])   Acute encephalopathy due to non convulsive status epilepticus  - Initially felt to be polypharmacy from anticholinergics and anxiolytics however EEG which showed active seizures and likely status epilepticus  - ABG unremarkable and labs reviewed, no evidence of significant metabolic disturbance, TSH and ammonia normal. CT head on admission negative and MRI with no acute intracranial process - febrile since yesterday morning, started on Acyclovir and underwent LP on 2/9, results unimpressive with normal glucose, normal protein, 2 WBC and few reds - D/w Neurology this morning again, awaiting continuous EEG read today  - touched base with ID over the phone as well, recommending checking VZV IgG and enteroviruses PCR, added on, called micro and should have enough sample left  ? UTI - culture with diphteroids ?contaminant, discontinued Levaquin 2/9  Generalized anxiety disorder - Home regimen includes PRN clonazepam, valium. NPO for now. Unlikely withdrawal as clinical picture not consistent with withdrawal  Essential  Hypertension - Home meds (Amlodipine, losartan) on hold until mentation has improved, unable to reliably take PO meds at this time - IV hydralazine PRN  History of CVA - Resume ASA and statin when mentation improved and able to take PO meds  Hyperlipidemia - Hold statin at this time, see above  Chronic abdominal pain - Extensive workup in the past with no etiology found. - Zofran as needed for nausea, will resume PPI when taking PO. - Lipase and LFTs normal  Hypokalemia - due to poor po intake, replete and recheck    Diet: Diet NPO time specified Fluids: NS 80ml/hr DVT Prophylaxis: Lovenox  Code Status: Full Code Family Communication: d/w son over the phone  Disposition Plan: remain in stepdown Barriers to discharge: critical illness  Consultants:  Neurology   Procedures:  EEG - called for initial results showing status epilepticus, final report pending.   Antibiotics  Anti-infectives    Start     Dose/Rate Route Frequency Ordered Stop   10/11/15 1400  acyclovir (ZOVIRAX) 500 mg in dextrose 5 % 100 mL IVPB     500 mg 110 mL/hr over 60 Minutes Intravenous Every 12 hours 10/11/15 1243     10/09/15 0400  levofloxacin (LEVAQUIN) IVPB 750 mg  Status:  Discontinued     750 mg 100 mL/hr over 90 Minutes Intravenous Every 48 hours 10/09/15 0358 10/11/15 1403       Studies  Dg Chest Port 1 View  10/11/2015  CLINICAL DATA:  Fever for 1 day. EXAM: PORTABLE CHEST 1 VIEW COMPARISON:  10/08/2015 FINDINGS: Cardiomediastinal silhouette is normal. Mediastinal contours appear intact. The aorta is torturous with mild atherosclerotic calcifications of the arch. There is no evidence of focal airspace consolidation, pleural effusion or  pneumothorax. Osseous structures are without acute abnormality. Soft tissues are grossly normal. IMPRESSION: No active disease. Electronically Signed   By: Fidela Salisbury M.D.   On: 10/11/2015 16:42    Objective  Filed Vitals:   10/12/15 0329  10/12/15 0829 10/12/15 1000 10/12/15 1208  BP: 140/62 157/70 167/69 161/68  Pulse: 80 85 69 81  Temp: 98.8 F (37.1 C) 98.7 F (37.1 C)  100.5 F (38.1 C)  TempSrc: Axillary Oral  Axillary  Resp: 24 23 26 24   Height:      Weight: 59.4 kg (130 lb 15.3 oz)     SpO2: 97% 97% 99% 100%    Intake/Output Summary (Last 24 hours) at 10/12/15 1300 Last data filed at 10/12/15 0700  Gross per 24 hour  Intake   1660 ml  Output   1200 ml  Net    460 ml   Filed Weights   10/09/15 1500 10/12/15 0329  Weight: 54.6 kg (120 lb 5.9 oz) 59.4 kg (130 lb 15.3 oz)   Exam:  GENERAL: opens eyes, somewhat tracking me, nonverbal at present  HEENT: pupils reactive to light, no scleral icterus  NECK: supple  LUNGS: CTA biL, no wheezing, respirations nonlabored  HEART: RRR without MRG  ABDOMEN: soft, non tender, no distended  EXTREMITIES: no clubbing / cyanosis, warm to touch, no edema  NEUROLOGIC: moves all 4 extremities spontaneously but does not follow commands  SKIN: no rashes  Data Reviewed: Basic Metabolic Panel:  Recent Labs Lab 10/05/15 1504 10/08/15 2059 10/09/15 0229 10/10/15 1405 10/12/15 0400  NA 133* 132* 134* 136 131*  K 3.7 3.8 3.7 3.3* 2.3*  CL 96* 93* 97* 101 95*  CO2 27 24 23 26 26   GLUCOSE 92 112* 97 98 129*  BUN 9 13 11 7 9   CREATININE 0.61 0.81 0.70 0.72 0.57  CALCIUM 9.8 10.3 10.3 9.8 9.0  MG  --   --   --  1.4* 1.5*   Liver Function Tests:  Recent Labs Lab 10/05/15 1504 10/08/15 2059 10/10/15 1405 10/12/15 0400  AST 25 26 25 21   ALT 12* 14 14 10*  ALKPHOS 51 48 47 40  BILITOT 0.9 1.0 1.1 0.8  PROT 7.5 6.8 6.6 5.8*  ALBUMIN 4.3 4.1 3.6 3.0*    Recent Labs Lab 10/05/15 1504 10/09/15 0228  LIPASE 25 35    Recent Labs Lab 10/08/15 2100  AMMONIA 16   CBC:  Recent Labs Lab 10/05/15 1504 10/08/15 2059 10/09/15 0229 10/10/15 1405 10/12/15 0400  WBC 3.7* 4.9 5.6 7.3 6.1  NEUTROABS 1.6*  --   --   --   --   HGB 12.2 11.9* 11.3*  12.0 11.1*  HCT 36.4 35.4* 33.3* 35.6* 32.1*  MCV 91.2 89.6 89.0 89.9 87.5  PLT 218 214 214 215 201   Cardiac Enzymes:  Recent Labs Lab 10/09/15 0035  CKTOTAL 79   BNP (last 3 results)  Recent Labs  10/09/15 0228  BNP 64.5   CBG:  Recent Labs Lab 10/10/15 0739 10/11/15 1309 10/11/15 1803 10/12/15 0022 10/12/15 0700  GLUCAP 90 70 98 104* 123*   Recent Results (from the past 240 hour(s))  Urine culture     Status: None   Collection Time: 10/05/15  2:27 PM  Result Value Ref Range Status   Specimen Description URINE, CATHETERIZED  Final   Special Requests Normal  Final   Culture   Final    NO GROWTH 2 DAYS Performed at [facility]  Report Status 10/07/2015 FINAL  Final  Urine culture     Status: None   Collection Time: 10/09/15  1:43 AM  Result Value Ref Range Status   Specimen Description URINE, CATHETERIZED  Final   Special Requests NONE  Final   Culture   Final    >=100,000 COLONIES/mL DIPHTHEROIDS(CORYNEBACTERIUM SPECIES) Standardized susceptibility testing for this organism is not available.    Report Status 10/10/2015 FINAL  Final  Culture, blood (Routine X 2) w Reflex to ID Panel     Status: None (Preliminary result)   Collection Time: 10/09/15  5:30 AM  Result Value Ref Range Status   Specimen Description BLOOD LEFT HAND  Final   Special Requests IN PEDIATRIC BOTTLE 1CC  Final   Culture NO GROWTH 2 DAYS  Final   Report Status PENDING  Incomplete  Culture, blood (Routine X 2) w Reflex to ID Panel     Status: None (Preliminary result)   Collection Time: 10/09/15  5:38 AM  Result Value Ref Range Status   Specimen Description BLOOD LEFT ARM  Final   Special Requests IN PEDIATRIC BOTTLE 1CC  Final   Culture NO GROWTH 2 DAYS  Final   Report Status PENDING  Incomplete  MRSA PCR Screening     Status: None   Collection Time: 10/10/15 11:39 PM  Result Value Ref Range Status   MRSA by PCR NEGATIVE NEGATIVE Final    Comment:        The GeneXpert  MRSA Assay (FDA approved for NASAL specimens only), is one component of a comprehensive MRSA colonization surveillance program. It is not intended to diagnose MRSA infection nor to guide or monitor treatment for MRSA infections.   CSF culture     Status: None (Preliminary result)   Collection Time: 10/11/15  1:33 PM  Result Value Ref Range Status   Specimen Description CSF  Final   Special Requests CSF NO 2  Final   Gram Stain   Final    CYTOSPIN SMEAR WBC PRESENT, PREDOMINANTLY PMN NO ORGANISMS SEEN    Culture PENDING  Incomplete   Report Status PENDING  Incomplete    Scheduled Meds: . acyclovir  500 mg Intravenous Q12H  . enoxaparin (LOVENOX) injection  40 mg Subcutaneous Q24H  . levETIRAcetam  500 mg Intravenous Q12H  . nystatin-triamcinolone  1 application Topical BID  . pantoprazole (PROTONIX) IV  40 mg Intravenous Q24H  . potassium chloride  10 mEq Intravenous Q1 Hr x 3  . simvastatin  40 mg Oral q morning - 10a  . sodium chloride flush  3 mL Intravenous Q12H  . vitamin B-12  1,000 mcg Oral Daily  . vitamin C  500 mg Oral Daily  . vitamin E  200 Units Oral Daily   Continuous Infusions: . dextrose 5 % and 0.45% NaCl 1,000 mL with potassium chloride 40 mEq infusion 75 mL/hr at 10/12/15 [id number]    Time spent coordinating care: > 35 minutes, greater than 50% of this time was spent bedside evaluating the patient, family discussions and discussing with neurology.   Marzetta Board, MD [facility] Pager [phone]. If 7 PM - 7 AM, please contact night-coverage at [email or web address], password [facility] 10/12/2015, 1:00 PM  LOS: 3 days

## 2015-10-12 NOTE — Progress Notes (Signed)
EEG maint complete. vLTM EEG running. No skin breakdown at electrode site FP1 Fp2 and C4. No event push buttons overnight

## 2015-10-12 NOTE — Progress Notes (Signed)
Pharmacy Antibiotic Note  Kathryn [name] is a 82 y.o. female admitted on 10/08/2015 with pneumonia.  Pharmacy has been consulted for vanc/ceftaz dosing.  Plan: 1. Vancomycin 500 mg IV q 12 hrs. 2. Ceftaz 1g IV q 12 hrs. 3. F/u cultures, clinical course.  Height: 5\' 3"  (160 cm) Weight: 130 lb 15.3 oz (59.4 kg) IBW/kg (Calculated) : 52.4  Temp (24hrs), Avg:100 F (37.8 C), Min:98.7 F (37.1 C), Max:101 F (38.3 C)   Recent Labs Lab 10/08/15 2059 10/09/15 0229 10/10/15 1405 10/12/15 0400  WBC 4.9 5.6 7.3 6.1  CREATININE 0.81 0.70 0.72 0.57    Estimated Creatinine Clearance: 44.1 mL/min (by C-G formula based on Cr of 0.57).    Allergies  Allergen Reactions  . Augmentin [Amoxicillin-Pot Clavulanate] Swelling and Diarrhea    Throat   . Citalopram Swelling    Eyes ears and throat swelling Throat and eyes   . Codeine Anaphylaxis and Shortness Of Breath  . Fish-Derived Products Anaphylaxis  . Hyoscyamine Anaphylaxis and Swelling  . Ibuprofen Swelling    Throat and eyes   . Latex Swelling and Rash    Swelling to troat  . Lipitor [Atorvastatin] Swelling    Muscle aches throat  . Septra [Bactrim] Anaphylaxis  . Shellfish Allergy Anaphylaxis  . Miconazole Rash  . Keflet [Cephalexin] Diarrhea    Upset stomach  . Meloxicam Diarrhea and Nausea And Vomiting  . Sulfa Antibiotics     Rash and also throat was tight  . Verapamil     Tachycardia and flushing  . Vicodin [Hydrocodone-Acetaminophen] Other (See Comments)    stroke  . Zoloft [Sertraline Hcl] Swelling    Red spots all over face, swelling of tongue and legs.   . Ciprofloxacin Nausea And Vomiting    Other reaction(s): Abdominal Pain  . Depakene [Valproate Sodium] Hives    All over body   . Isoptin Sr [Verapamil Hcl Er] Cough  . Lisinopril Cough    Fatigue and cough  . Telmisartan-Hctz Cough    Antimicrobials this admission: Levaquin 2/7>>2/9 Acyclovir 2/9>> Macrobid PTA Vancomycin 2/10 > Ceftaz 2/10  >  Dose adjustments this admission: n/a  Microbiology results: 2/7 BCx2>>ngtd 2/7 UC>> > 100K diptheroids F 2/9 CSF>>ngtd  Thank you for allowing pharmacy to be a part of this patient's care.  Uvaldo Rising, BCPS  Clinical Pharmacist Pager [phone]  10/12/2015 7:47 PM

## 2015-10-12 NOTE — Procedures (Signed)
Intubation Procedure Note Kathryn [name] [id number] 03-29-1931  Procedure: Intubation Indications: Airway protection and maintenance  Procedure Details Consent: Risks of procedure as well as the alternatives and risks of each were explained to the (patient/caregiver).  Consent for procedure obtained. Time Out: Verified patient identification, verified procedure, site/side was marked, verified correct patient position, special equipment/implants available, medications/allergies/relevent history reviewed, required imaging and test results available.  Performed  Maximum sterile technique was used including gloves and hand hygiene.  MAC and 3  Given 2 mg versed, 100 mcg fentanyl, 20 mg etomidate.  Attempted visualization with glidescope >> used both #3 and #4 blades.  Very anterior airway, and difficult to visualize ETT through glidescope.  Changed to MAC 3 laryngoscope.  Inserted ETT to 23 cm at lip.  Confirmed with ausculation and CO2 detector.  Thick yellow secretions noted from airway.  Evaluation Hemodynamic Status: BP stable throughout; O2 sats: stable throughout Patient's Current Condition: stable Complications: No apparent complications Patient did tolerate procedure well. Chest X-ray ordered to verify placement.  CXR: pending. Will send sputum for culture and add Abx for possible HCAP.   Chesley Mires, MD [facility] Pulmonary/Critical Care 10/12/2015, 7:17 PM Pager:  [phone] After 3pm call: [phone]

## 2015-10-12 NOTE — Progress Notes (Signed)
[place] Progress Note Patient Name: SHANIQUEA HNAT DOB: 1931/07/02 MRN: [id number]   Date of Service  10/12/2015  HPI/Events of Note  Contacted by respiratory therapist regarding respiratory alkalosis. Tidal volume 450 mL with respiratory rate set at 16. Patient currently sedated with propofol. Not currently overbreathing the ventilator per report.  eICU Interventions  1. Decrease ventilator rate to 12 breaths per minute 2. Continue to wean FiO2 for saturation greater than 94% 3. Repeat ABG 2300 hrs.     Intervention Category Major Interventions: Respiratory failure - evaluation and management  Tera Partridge 10/12/2015, 10:10 PM

## 2015-10-12 NOTE — Plan of Care (Signed)
Report for EEG is back, looks like she has ongoing seizures at 2 generalized convulsive seizures. Discussed with Dr. Janann Colonel from Neurology, recommending sedation and intubation for ongoing status. Discussed with Dr. Oletta Darter from PCCM who will see patient in consultation.  Costin M. Cruzita Lederer, MD [facility] [phone]

## 2015-10-12 NOTE — Procedures (Signed)
Continuous Video-EEG Monitoring Report  Patient: Kathryn [name], [place]     EEG No.ID: [id number]     DOB: Dec 09, 1930 Age: 82 Room#2C11  MED REC NO: [id number] Gender: Female   TECH: Wyman Songster     Physician: Winfield Cunas     Referring Physician: Jim Like   Report Date: 10/12/2015    Study Duration: 10/11/2015 11:06 to 10/12/2015 07:30 CPT Code:  [id number] Diagnosis:  Altered mental status (R41.82); Seizures (R56.9)  History: This is an 82 year old female presenting with altered mental status with visual hallucinaiton.  Video-EEG monitoring was performed to evaluate for seizures.  Technical Details:  Long-term video-EEG monitoring was performed using standard setting per the guidelines.  Briefly, a minimum of 21 electrodes were placed on scalp according to the International 10-20 or/and 10-10 Systems.  Supplemental electrodes were placed as needed.  Single EKG electrode was also used to detect cardiac arrhythmia.  Patient's behavior was continuously recorded on video simultaneously with EEG.  A minimum of 16 channels were used for data display.  Each epoch of study was reviewed manually daily and as needed using standard referential and bipolar montages.    EEG Description: There was generalized polymorphic delta and theta slowing.  Diffuse beta activity was also seen, which could be due to medication effect (e.g., benzodiazepine).  No posterior dominant rhythm or sleep architecture was recorded.  Frequent focal sharp waves were present in the left temporal region with phase reversal at T3-T5.  These left temporal sharp waves appeared periodic at times (about 0.5-1 Hz), consistent with periodic lateralized epileptiform discharges (PLEDs).  Two generalized convulsive seizures were captured at 10/11/2015 18:11, 10/12/2015 02:52 (the ictal onset was obscured by muscle artifacts, but very likely to be left temporal region).  Impression:  This is an abnormal EEG due to the presence of the following: 1) Generalized  polymorphic delta and theta slowing, suggesting moderate to severe encephalopathy, but medication effect could not be excluded; 2) Interictal epileptiform discharges in the left temporal region which appeared as PLEDs at times; 3) Two generalized convulsive seizures (the ictal onset was obscured by muscle artifacts, but very likely to be left temporal region).          Reading Physician: Winfield Cunas, MD, PhD

## 2015-10-12 NOTE — Progress Notes (Addendum)
Subjective: No overnight events. Eyes closed, and remains non-verbal this morning. Remains febrile, no leukocytosis.   Objective: Current vital signs: BP 140/62 mmHg  Pulse 80  Temp(Src) 98.8 F (37.1 C) (Axillary)  Resp 24  Ht 5\' 3"  (1.6 m)  Wt 59.4 kg (130 lb 15.3 oz)  BMI 23.20 kg/m2  SpO2 97% Vital signs in last 24 hours: Temp:  [98.8 F (37.1 C)-101.2 F (38.4 C)] 98.8 F (37.1 C) (02/10 0329) Pulse Rate:  [73-83] 80 (02/10 0329) Resp:  [15-25] 24 (02/10 0329) BP: (140-174)/(62-97) 140/62 mmHg (02/10 0329) SpO2:  [97 %-100 %] 97 % (02/10 0329) Weight:  [59.4 kg (130 lb 15.3 oz)] 59.4 kg (130 lb 15.3 oz) (02/10 0329)  Intake/Output from previous day: 02/09 0701 - 02/10 0700 In: 2097.5 [I.V.:1682.5; IV Piggyback:415] Out: [id number] [Urine:1750] Intake/Output this shift:   Nutritional status: Diet NPO time specified  Neurologic Exam: Mental Status: Eyes closed, resists opening, non-verbal, not following commands Cranial Nerves: II: optic discs not visualized, no blink to threat, pupils equal, round, reactive to light III,IV, VI: ptosis not present, no gaze deviation noted V,VII: face symmetric Motor: Increased tone throughout, brief clonic type movements of right side No spontaneous movement of left side Sensory: no withdrawal to noxious stimuli  Lab Results: Basic Metabolic Panel:  Recent Labs Lab 10/05/15 1504 10/08/15 2059 10/09/15 0229 10/10/15 1405 10/12/15 0400  NA 133* 132* 134* 136 131*  K 3.7 3.8 3.7 3.3* 2.3*  CL 96* 93* 97* 101 95*  CO2 27 24 23 26 26   GLUCOSE 92 112* 97 98 129*  BUN 9 13 11 7 9   CREATININE 0.61 0.81 0.70 0.72 0.57  CALCIUM 9.8 10.3 10.3 9.8 9.0  MG  --   --   --  1.4* 1.5*    Liver Function Tests:  Recent Labs Lab 10/05/15 1504 10/08/15 2059 10/10/15 1405 10/12/15 0400  AST 25 26 25 21   ALT 12* 14 14 10*  ALKPHOS 51 48 47 40  BILITOT 0.9 1.0 1.1 0.8  PROT 7.5 6.8 6.6 5.8*  ALBUMIN 4.3 4.1 3.6 3.0*    Recent  Labs Lab 10/05/15 1504 10/09/15 0228  LIPASE 25 35    Recent Labs Lab 10/08/15 2100  AMMONIA 16    CBC:  Recent Labs Lab 10/05/15 1504 10/08/15 2059 10/09/15 0229 10/10/15 1405 10/12/15 0400  WBC 3.7* 4.9 5.6 7.3 6.1  NEUTROABS 1.6*  --   --   --   --   HGB 12.2 11.9* 11.3* 12.0 11.1*  HCT 36.4 35.4* 33.3* 35.6* 32.1*  MCV 91.2 89.6 89.0 89.9 87.5  PLT 218 214 214 215 201    Cardiac Enzymes:  Recent Labs Lab 10/09/15 0035  CKTOTAL 79    Lipid Panel:  Recent Labs Lab 10/09/15 0229  CHOL 181  TRIG 45  HDL 53  CHOLHDL 3.4  VLDL 9  LDLCALC 119*    CBG:  Recent Labs Lab 10/10/15 0739 10/11/15 1309 10/11/15 1803 10/12/15 0022 10/12/15 0700  GLUCAP 90 70 7 104* 123*    Microbiology: Results for orders placed or performed during the hospital encounter of 10/08/15  Urine culture     Status: None   Collection Time: 10/09/15  1:43 AM  Result Value Ref Range Status   Specimen Description URINE, CATHETERIZED  Final   Special Requests NONE  Final   Culture   Final    >=100,000 COLONIES/mL DIPHTHEROIDS(CORYNEBACTERIUM SPECIES) Standardized susceptibility testing for this organism is not available.  Report Status 10/10/2015 FINAL  Final  Culture, blood (Routine X 2) w Reflex to ID Panel     Status: None (Preliminary result)   Collection Time: 10/09/15  5:30 AM  Result Value Ref Range Status   Specimen Description BLOOD LEFT HAND  Final   Special Requests IN PEDIATRIC BOTTLE 1CC  Final   Culture NO GROWTH 2 DAYS  Final   Report Status PENDING  Incomplete  Culture, blood (Routine X 2) w Reflex to ID Panel     Status: None (Preliminary result)   Collection Time: 10/09/15  5:38 AM  Result Value Ref Range Status   Specimen Description BLOOD LEFT ARM  Final   Special Requests IN PEDIATRIC BOTTLE 1CC  Final   Culture NO GROWTH 2 DAYS  Final   Report Status PENDING  Incomplete  MRSA PCR Screening     Status: None   Collection Time: 10/10/15 11:39  PM  Result Value Ref Range Status   MRSA by PCR NEGATIVE NEGATIVE Final    Comment:        The GeneXpert MRSA Assay (FDA approved for NASAL specimens only), is one component of a comprehensive MRSA colonization surveillance program. It is not intended to diagnose MRSA infection nor to guide or monitor treatment for MRSA infections.   CSF culture     Status: None (Preliminary result)   Collection Time: 10/11/15  1:33 PM  Result Value Ref Range Status   Specimen Description CSF  Final   Special Requests CSF NO 2  Final   Gram Stain   Final    CYTOSPIN SMEAR WBC PRESENT, PREDOMINANTLY PMN NO ORGANISMS SEEN    Culture PENDING  Incomplete   Report Status PENDING  Incomplete    Coagulation Studies: No results for input(s): LABPROT, INR in the last 72 hours.  Imaging: Ct Head Wo Contrast  10/10/2015  CLINICAL DATA:  Recent seizure activity EXAM: CT HEAD WITHOUT CONTRAST TECHNIQUE: Contiguous axial images were obtained from the base of the skull through the vertex without intravenous contrast. COMPARISON:  10/08/2015, 10/09/2015 FINDINGS: Bony calvarium is intact. Mild atrophic changes are noted. Areas of chronic white matter ischemic change are seen in the deep white matter. Additionally lacunar infarcts are noted adjacent to the right lateral ventricle. These are stable from the prior exam. No acute hemorrhage, acute infarction or space-occupying mass lesion is noted. IMPRESSION: Chronic atrophic and ischemic changes without acute abnormality. Electronically Signed   By: Inez Catalina M.D.   On: 10/10/2015 12:10   Dg Chest Port 1 View  10/11/2015  CLINICAL DATA:  Fever for 1 day. EXAM: PORTABLE CHEST 1 VIEW COMPARISON:  10/08/2015 FINDINGS: Cardiomediastinal silhouette is normal. Mediastinal contours appear intact. The aorta is torturous with mild atherosclerotic calcifications of the arch. There is no evidence of focal airspace consolidation, pleural effusion or pneumothorax. Osseous  structures are without acute abnormality. Soft tissues are grossly normal. IMPRESSION: No active disease. Electronically Signed   By: Fidela Salisbury M.D.   On: 10/11/2015 16:42    Medications: I have reviewed the patient's current medications.  Assessment/Plan:  82y/o woman with hx of HTN, TIA, stroke admitted 2/07 with altered mental status and visual hallucinations. Noted to have complex partial seizures during routine EEG on 2/08. These episodes broke with ativan and she appeared post-ictal.   Remains poorly responsive. Unclear etiology. CSF workup unremarkable so far, awaiting HSV PCR but initial results do not appear infectious. She does remain febrile. No seizure activity noted on  EEG though will await full report.   -continue LTM EEG  -continue keppra, added vimpat based on EEG showing frequent left temporal sharp wave activity -suggest discussion with ID to see if they have any insight in continued fever with unclear source -hold all sedating medications   Addendum: EEG update shows 2 generalized convulsive episodes overnight. Based on these findings discussed with primary team, will plan for intubation and more aggressive treatment .   LOS: 3 days   Jim Like, DO [facility] [phone]  If 7pm- 7am, please page neurology on call as listed in [place]. 10/12/2015  7:42 AM

## 2015-10-12 NOTE — Care Management Important Message (Signed)
Important Message  Patient Details  Name: Kathryn [name] MRN: [id number] Date of Birth: 1930-11-28   Medicare Important Message Given:  Yes    Lua Feng Abena 10/12/2015, 11:15 AM

## 2015-10-13 ENCOUNTER — Inpatient Hospital Stay (HOSPITAL_COMMUNITY): Payer: Medicare Other

## 2015-10-13 DIAGNOSIS — R410 Disorientation, unspecified: Secondary | ICD-10-CM

## 2015-10-13 DIAGNOSIS — G8929 Other chronic pain: Secondary | ICD-10-CM

## 2015-10-13 DIAGNOSIS — Z978 Presence of other specified devices: Secondary | ICD-10-CM | POA: Insufficient documentation

## 2015-10-13 DIAGNOSIS — R109 Unspecified abdominal pain: Secondary | ICD-10-CM

## 2015-10-13 DIAGNOSIS — Z789 Other specified health status: Secondary | ICD-10-CM

## 2015-10-13 DIAGNOSIS — G9340 Encephalopathy, unspecified: Secondary | ICD-10-CM | POA: Insufficient documentation

## 2015-10-13 LAB — GLUCOSE, CAPILLARY
GLUCOSE-CAPILLARY: 93 mg/dL (ref 65–99)
GLUCOSE-CAPILLARY: 149 mg/dL — AB (ref 65–99)
GLUCOSE-CAPILLARY: 100 mg/dL — AB (ref 65–99)
GLUCOSE-CAPILLARY: 104 mg/dL — AB (ref 65–99)
Glucose-Capillary: 104 mg/dL — ABNORMAL HIGH (ref 65–99)
Glucose-Capillary: 125 mg/dL — ABNORMAL HIGH (ref 65–99)

## 2015-10-13 LAB — POCT I-STAT 3, ART BLOOD GAS (G3+)
ACID-BASE EXCESS: 2 mmol/L (ref 0.0–2.0)
Bicarbonate: 24.6 mEq/L — ABNORMAL HIGH (ref 20.0–24.0)
O2 Saturation: 100 %
PCO2 ART: 32 mmHg — AB (ref 35.0–45.0)
PH ART: 7.494 — AB (ref 7.350–7.450)
PO2 ART: 176 mmHg — AB (ref 80.0–100.0)
Patient temperature: 98.6
TCO2: 26 mmol/L (ref 0–100)

## 2015-10-13 LAB — BASIC METABOLIC PANEL
Anion gap: 8 (ref 5–15)
BUN: 18 mg/dL (ref 6–20)
CO2: 24 mmol/L (ref 22–32)
CREATININE: 0.87 mg/dL (ref 0.44–1.00)
Calcium: 8.8 mg/dL — ABNORMAL LOW (ref 8.9–10.3)
Chloride: 104 mmol/L (ref 101–111)
GFR calc Af Amer: >60 mL/min (ref >60)
GFR, EST NON AFRICAN AMERICAN: 60 mL/min — AB (ref >60)
GLUCOSE: 133 mg/dL — AB (ref 65–99)
POTASSIUM: 3.1 mmol/L — AB (ref 3.5–5.1)
Sodium: 136 mmol/L (ref 135–145)

## 2015-10-13 LAB — CBC
HEMATOCRIT: 30.7 % — AB (ref 36.0–46.0)
Hemoglobin: 10.5 g/dL — ABNORMAL LOW (ref 12.0–15.0)
MCH: 30 pg (ref 26.0–34.0)
MCHC: 34.2 g/dL (ref 30.0–36.0)
MCV: 87.7 fL (ref 78.0–100.0)
Platelets: 153 10*3/uL (ref 150–400)
RBC: 3.5 MIL/uL — AB (ref 3.87–5.11)
RDW: 12.4 % (ref 11.5–15.5)
WBC: 6.4 10*3/uL (ref 4.0–10.5)

## 2015-10-13 LAB — PHOSPHORUS: PHOSPHORUS: 3 mg/dL (ref 2.5–4.6)

## 2015-10-13 LAB — VITAMIN B1: Vitamin B1 (Thiamine): 112.5 nmol/L (ref 66.5–200.0)

## 2015-10-13 LAB — PHENYTOIN LEVEL, TOTAL: PHENYTOIN LVL: 15.8 ug/mL (ref 10.0–20.0)

## 2015-10-13 LAB — MAGNESIUM: Magnesium: 1.8 mg/dL (ref 1.7–2.4)

## 2015-10-13 MED ORDER — SODIUM CHLORIDE 0.9 % IV BOLUS (SEPSIS)
1000.0000 mL | Freq: Once | INTRAVENOUS | Status: AC
  Administered 2015-10-13: 1000 mL via INTRAVENOUS

## 2015-10-13 MED ORDER — MAGNESIUM SULFATE 50 % IJ SOLN
2.0000 g | Freq: Once | INTRAVENOUS | Status: AC
  Administered 2015-10-13: 2 g via INTRAVENOUS
  Filled 2015-10-13: qty 4

## 2015-10-13 MED ORDER — VITAL AF 1.2 CAL PO LIQD
1000.0000 mL | ORAL | Status: DC
  Administered 2015-10-13 – 2015-10-14 (×2): 1000 mL

## 2015-10-13 MED ORDER — PHENYTOIN SODIUM 50 MG/ML IJ SOLN
100.0000 mg | Freq: Three times a day (TID) | INTRAMUSCULAR | Status: DC
  Administered 2015-10-13 – 2015-10-16 (×10): 100 mg via INTRAVENOUS
  Filled 2015-10-13 (×9): qty 2

## 2015-10-13 MED ORDER — SODIUM CHLORIDE 0.9 % IV SOLN
INTRAVENOUS | Status: DC | PRN
  Administered 2015-10-13: 10 mL/h via INTRA_ARTERIAL

## 2015-10-13 MED ORDER — PRO-STAT SUGAR FREE PO LIQD
30.0000 mL | Freq: Every day | ORAL | Status: DC
  Administered 2015-10-13 – 2015-10-15 (×3): 30 mL
  Filled 2015-10-13 (×3): qty 30

## 2015-10-13 MED ORDER — PHENYLEPHRINE HCL 10 MG/ML IJ SOLN
0.0000 ug/min | INTRAVENOUS | Status: DC
  Administered 2015-10-13: 20 ug/min via INTRAVENOUS
  Filled 2015-10-13: qty 1

## 2015-10-13 MED ORDER — POTASSIUM CHLORIDE 20 MEQ/15ML (10%) PO SOLN
40.0000 meq | Freq: Once | ORAL | Status: AC
  Administered 2015-10-13: 40 meq
  Filled 2015-10-13: qty 30

## 2015-10-13 MED ORDER — SODIUM CHLORIDE 0.9 % IV SOLN
20.0000 mg/kg | Freq: Once | INTRAVENOUS | Status: AC
  Administered 2015-10-13: 1188 mg via INTRAVENOUS
  Filled 2015-10-13: qty 23.76

## 2015-10-13 NOTE — Progress Notes (Signed)
Nutrition Follow-up  INTERVENTION:   D/C Vital High Protein Initiate Vital AF 1.2 @ 40 ml/hr 30 ml Prostat daily Provides: 1252 kcal, 87 grams protein, and 778 ml H2O.  TF regimen and propofol at current rate providing 1486 total kcal/day (104 % of kcal needs)  NUTRITION DIAGNOSIS:   Inadequate oral intake related to inability to eat as evidenced by NPO status. Ongoing.   GOAL:   Patient will meet greater than or equal to 90% of their needs Progressing.   MONITOR:   TF tolerance, Vent status, Labs, I & O's  REASON FOR ASSESSMENT:   Consult Enteral/tube feeding initiation and management  ASSESSMENT:   [name] with PMH of HTN, hyperlipidemia, GERD, hypothyroidism, depression, anxiety, TIA, stroke, recurrent UTI, chronic back pain, chronic abdominal pain, who presented with altered mental status and hallucination. 2/10 Pt had EEG with noted persistent seizures. Pt transferred to ICU for diprivan coma and intubation. Abx for possible UTI.   VHP infusing @ 40 ml/hr provides: 960 kcal, 81 grams protein.  Medications reviewed and include: KCl, B12, Vitamin C, Vitamin E CBG's: 100-149 Labs reviewed: sodium low 128, potassium low 3.4 OG tube Nutrition-Focused physical exam completed but somewhat limited. Findings are no fat depletion, no muscle depletion, and no edema.    MV: 9.3 L/min Temp (24hrs), Avg:98.6 F (37 C), Min:97 F (36.1 C), Max:100.4 F (38 C)  Propofol: 8.9 ml/hr provides: 234 kcal per day from lipid Discussed with RN.    Diet Order:  Diet NPO time specified  Skin:  Reviewed, no issues  Last BM:  unknown  Height:   Ht Readings from Last 1 Encounters:  10/12/15 5\' 3"  (1.6 m)   Weight:   Wt Readings from Last 1 Encounters:  10/13/15 131 lb 6.3 oz (59.6 kg)   Ideal Body Weight:  52 kg  BMI:  Body mass index is 23.28 kg/(m^2).  Estimated Nutritional Needs:   Kcal:  1424  Protein:  75-85 grams  Fluid:  >/= 1.5 L  EDUCATION  NEEDS:   No education needs identified at this time  [place], [place], [place] Pager [phone] After Hours Pager

## 2015-10-13 NOTE — Progress Notes (Signed)
MEDICATION RELATED CONSULT NOTE - INITIAL   Pharmacy Consult for phenytoin Indication: seizures   Patient Measurements: Height: 5\' 3"  (160 cm) Weight: 131 lb 6.3 oz (59.6 kg) IBW/kg (Calculated) : 52.4  Vital Signs: Temp: 97 F (36.1 C) (02/11 0758) Temp Source: Axillary (02/11 0758) BP: 121/55 mmHg (02/11 0909) Pulse Rate: 68 (02/11 0909) Intake/Output from previous day: 02/10 0701 - 02/11 0700 In: 684.9 [I.V.:421.2; NG/GT:43.7; IV Piggyback:220] Out: 775 [Urine:775] Intake/Output from this shift: Total I/O In: 1011.4 [I.V.:392.7; NG/GT:248.7; IV Piggyback:370] Out: 725 [Urine:725]  Labs:  Recent Labs  10/10/15 1405 10/12/15 0400 10/12/15 1924 10/13/15 0512  WBC 7.3 6.1  --  6.4  HGB 12.0 11.1*  --  10.5*  HCT 35.6* 32.1*  --  30.7*  PLT 215 201  --  153  CREATININE 0.72 0.57 0.78  --   MG 1.4* 1.5*  --  1.8  PHOS  --   --   --  3.0  ALBUMIN 3.6 3.0* 3.2*  --   PROT 6.6 5.8* 6.2*  --   AST 25 21 26   --   ALT 14 10* 12*  --   ALKPHOS 47 40 43  --   BILITOT 1.1 0.8 1.1  --    Estimated Creatinine Clearance: 44.1 mL/min (by C-G formula based on Cr of 0.78).  Assessment: CC: AMS  PMH: Depression, HTN, HLD, hypothyroid, TIA, CVA, migraine, fatty liver  Neuro: admit w/ encephalopathy, hallucinations, nonverbal today; found unresponsive 2/9 am; EEG likely status epilepticus Keppra, Vimpat, Phenytoin (Fos load on 2/11)  Renal: SCr stable, CrCL 44 ml/min, K+ 2.3 (4 runs + IVF with KCL), low Na/CL, Mag 1.5 (2gm given) - good UOP 1.2, D51/2NS 40K at 75 ml/hr  Goal of Therapy:  Phenytoin 10 - 20 mcg/mL  Plan:  - Add phenytoin IV 100 mg q8h - F/u phenytoin level at [facility]  Levester Fresh, PharmD, BCPS, [facility] Clinical Pharmacist Pager [phone] 10/13/2015 10:19 AM

## 2015-10-13 NOTE — Progress Notes (Addendum)
Subjective: Intubated and started on propofol drip. Overnight RN noted rhythmic jaw tremor with EEG showing left temporal sharp activity but no noted electrographic seizure activity. Given loading dose of fosphenytoin. Had subsequent drop in blood pressure requiring pressor support.   Objective: Current vital signs: BP 133/62 mmHg  Pulse 62  Temp(Src) 98.4 F (36.9 C) (Axillary)  Resp 17  Ht 5\' 3"  (1.6 m)  Wt 59.6 kg (131 lb 6.3 oz)  BMI 23.28 kg/m2  SpO2 100% Vital signs in last 24 hours: Temp:  [98.4 F (36.9 C)-100.5 F (38.1 C)] 98.4 F (36.9 C) (02/10 2000) Pulse Rate:  [57-94] 62 (02/11 0630) Resp:  [11-27] 17 (02/11 0630) BP: (48-181)/(33-109) 133/62 mmHg (02/11 0630) SpO2:  [95 %-100 %] 100 % (02/11 0630) FiO2 (%):  [40 %-100 %] 40 % (02/11 0433) Weight:  [59.6 kg (131 lb 6.3 oz)] 59.6 kg (131 lb 6.3 oz) (02/11 0426)  Intake/Output from previous day: 02/10 0701 - 02/11 0700 In: 684.9 [I.V.:421.2; NG/GT:43.7; IV Piggyback:220] Out: 775 [Urine:775] Intake/Output this shift:   Nutritional status: Diet NPO time specified  Neurologic Exam: Mental Status: (on sedation) Eyes closed, resists opening, non-verbal, not following commands Cranial Nerves: II: no blink to threat, pupils equal, round, reactive to light III,IV, VI: ptosis not present, no gaze deviation noted V,VII: face symmetric Motor: No abnormal movements, no jaw tremor noted Sensory: no withdrawal to noxious stimuli  Lab Results: Basic Metabolic Panel:  Recent Labs Lab 10/08/15 2059 10/09/15 0229 10/10/15 1405 10/12/15 0400 10/12/15 1711 10/12/15 1924 10/13/15 0512  NA 132* 134* 136 131*  --  128*  --   K 3.8 3.7 3.3* 2.3* 3.3* 3.4*  --   CL 93* 97* 101 95*  --  93*  --   CO2 24 23 26 26   --  24  --   GLUCOSE 112* 97 98 129*  --  163*  --   BUN 13 11 7 9   --  9  --   CREATININE 0.81 0.70 0.72 0.57  --  0.78  --   CALCIUM 10.3 10.3 9.8 9.0  --  9.0  --   MG  --   --  1.4* 1.5*  --   --  1.8   PHOS  --   --   --   --   --   --  3.0    Liver Function Tests:  Recent Labs Lab 10/08/15 2059 10/10/15 1405 10/12/15 0400 10/12/15 1924  AST 26 25 21 26   ALT 14 14 10* 12*  ALKPHOS 48 47 40 43  BILITOT 1.0 1.1 0.8 1.1  PROT 6.8 6.6 5.8* 6.2*  ALBUMIN 4.1 3.6 3.0* 3.2*    Recent Labs Lab 10/09/15 0228  LIPASE 35    Recent Labs Lab 10/08/15 2100  AMMONIA 16    CBC:  Recent Labs Lab 10/08/15 2059 10/09/15 0229 10/10/15 1405 10/12/15 0400 10/13/15 0512  WBC 4.9 5.6 7.3 6.1 6.4  HGB 11.9* 11.3* 12.0 11.1* 10.5*  HCT 35.4* 33.3* 35.6* 32.1* 30.7*  MCV 89.6 89.0 89.9 87.5 87.7  PLT 214 214 215 201 153    Cardiac Enzymes:  Recent Labs Lab 10/09/15 0035  CKTOTAL 79    Lipid Panel:  Recent Labs Lab 10/09/15 0229 10/12/15 1924  CHOL 181  --   TRIG 45 46  HDL 53  --   CHOLHDL 3.4  --   VLDL 9  --   LDLCALC 119*  --  CBG:  Recent Labs Lab 10/12/15 0700 10/12/15 1207 10/12/15 1720 10/13/15 0106 10/13/15 0422  GLUCAP 123* 133* 117* 30 149*    Microbiology: Results for orders placed or performed during the hospital encounter of 10/08/15  Urine culture     Status: None   Collection Time: 10/09/15  1:43 AM  Result Value Ref Range Status   Specimen Description URINE, CATHETERIZED  Final   Special Requests NONE  Final   Culture   Final    >=100,000 COLONIES/mL DIPHTHEROIDS(CORYNEBACTERIUM SPECIES) Standardized susceptibility testing for this organism is not available.    Report Status 10/10/2015 FINAL  Final  Culture, blood (Routine X 2) w Reflex to ID Panel     Status: None (Preliminary result)   Collection Time: 10/09/15  5:30 AM  Result Value Ref Range Status   Specimen Description BLOOD LEFT HAND  Final   Special Requests IN PEDIATRIC BOTTLE 1CC  Final   Culture NO GROWTH 3 DAYS  Final   Report Status PENDING  Incomplete  Culture, blood (Routine X 2) w Reflex to ID Panel     Status: None (Preliminary result)   Collection Time:  10/09/15  5:38 AM  Result Value Ref Range Status   Specimen Description BLOOD LEFT ARM  Final   Special Requests IN PEDIATRIC BOTTLE 1CC  Final   Culture NO GROWTH 3 DAYS  Final   Report Status PENDING  Incomplete  MRSA PCR Screening     Status: None   Collection Time: 10/10/15 11:39 PM  Result Value Ref Range Status   MRSA by PCR NEGATIVE NEGATIVE Final    Comment:        The GeneXpert MRSA Assay (FDA approved for NASAL specimens only), is one component of a comprehensive MRSA colonization surveillance program. It is not intended to diagnose MRSA infection nor to guide or monitor treatment for MRSA infections.   CSF culture     Status: None (Preliminary result)   Collection Time: 10/11/15  1:33 PM  Result Value Ref Range Status   Specimen Description CSF  Final   Special Requests CSF NO 2  Final   Gram Stain   Final    CYTOSPIN SMEAR WBC PRESENT, PREDOMINANTLY PMN NO ORGANISMS SEEN    Culture NO GROWTH < 24 HOURS  Final   Report Status PENDING  Incomplete    Coagulation Studies: No results for input(s): LABPROT, INR in the last 72 hours.  Imaging: Dg Chest Port 1 View  10/12/2015  CLINICAL DATA:  Endotracheal tube placement. EXAM: PORTABLE CHEST 1 VIEW COMPARISON:  Chest x-ray dated 10/11/2015 and chest x-ray dated 10/08/2015. FINDINGS: Endotracheal tube has been placed with tip well positioned approximately 2.5 cm above the level of the carina. Heart size remains normal. Lungs are clear. No pleural effusions seen. No pneumothorax seen. IMPRESSION: Endotracheal tube well positioned with tip approximately 2.5 cm above the level of the carina. Electronically Signed   By: Franki Cabot M.D.   On: 10/12/2015 19:43   Dg Chest Port 1 View  10/11/2015  CLINICAL DATA:  Fever for 1 day. EXAM: PORTABLE CHEST 1 VIEW COMPARISON:  10/08/2015 FINDINGS: Cardiomediastinal silhouette is normal. Mediastinal contours appear intact. The aorta is torturous with mild atherosclerotic  calcifications of the arch. There is no evidence of focal airspace consolidation, pleural effusion or pneumothorax. Osseous structures are without acute abnormality. Soft tissues are grossly normal. IMPRESSION: No active disease. Electronically Signed   By: Fidela Salisbury M.D.   On: 10/11/2015 16:42  Medications: I have reviewed the patient's current medications.  Assessment/Plan:  82y/o woman with hx of HTN, TIA, stroke admitted 2/07 with altered mental status and visual hallucinations. Noted to have complex partial seizures during routine EEG on 2/08. These episodes broke with ativan and she appeared post-ictal.   Remains poorly responsive. Unclear etiology. CSF workup unremarkable so far, awaiting HSV PCR but initial results do not appear infectious. She does remain febrile.   LTM from 2/09-10 noted 2 brief generalized seizures. Due to continued altered mental status and concern for airway protection the decision was made to intubate. Sedated with propofol. Received fosphenytoin which resulted in drop in blood pressure. HSV negative, will discontinue acyclovir.   -continue LTM EEG  -will begin to taper propofol today and monitor EEG -continue keppra, vimpat and dilantin -d/c acyclovir, awaiting remaining CSF studies -hold all sedating medications     LOS: 4 days   Jim Like, DO [facility] [phone]  If 7pm- 7am, please page neurology on call as listed in [place]. 10/13/2015  7:38 AM

## 2015-10-13 NOTE — Progress Notes (Signed)
[facility] Progress Note Patient Name: Kathryn [name] DOB: 09/25/1930 MRN: [id number]   Date of Service  10/13/2015  HPI/Events of Note  Pt given IV dose Cerebyx for focal seizures. Has PIV. RN called to report hypotension.  eICU Interventions  1. 1L NS bolus 2. Peripheral Neosynephrine to maintain MAP >65 & SBP >90     Intervention Category Major Interventions: Hypotension - evaluation and management  Tera Partridge 10/13/2015, 12:57 AM

## 2015-10-13 NOTE — Progress Notes (Addendum)
PULMONARY / CRITICAL CARE MEDICINE   Name: Kathryn [name] MRN: [id number] DOB: 1930-09-20    ADMISSION DATE:  10/08/2015 CONSULTATION DATE:  10/12/2015  REFERRING MD:  Dr. Janann Colonel, neurology  CHIEF COMPLAINT:  Seizures.  HISTORY OF PRESENT ILLNESS:   Hx from chart.  82 yo female brought to ER due to confusion after falling.  She was also having visual hallucinations.  She was noted to have seizure on 2/08 and neurology consulted.  She was also noted to have fever.  She had LP that was unrevealing.  She was started on abx for possible UTI, and acyclovir for possible encephalitis.  Her seizures persistent, and neuro recommend transfer to ICU for diprivan coma and intubation.  SUBJECTIVE:  Sedated on vent  VITAL SIGNS: BP 116/53 mmHg  Pulse 65  Temp(Src) 97 F (36.1 C) (Axillary)  Resp 14  Ht 5\' 3"  (1.6 m)  Wt 131 lb 6.3 oz (59.6 kg)  BMI 23.28 kg/m2  SpO2 100%  HEMODYNAMICS:    VENTILATOR SETTINGS: Vent Mode:  [-] PRVC FiO2 (%):  [40 %-100 %] 40 % Set Rate:  [12 bmp-16 bmp] 12 bmp Vt Set:  [450 mL] 450 mL PEEP:  [5 cmH20] 5 cmH20 Plateau Pressure:  [14 cmH20-17 cmH20] 17 cmH20  INTAKE / OUTPUT: I/O last 3 completed shifts: In: 1784.9 [I.V.:1321.2; NG/GT:43.7; IV Piggyback:420] Out: [id number] [Urine:1525]  PHYSICAL EXAMINATION: General:  Ill appearing Neuro:  Sedated , no szs, no focus or follow HEENT: No JVD Cardiovascular:  Regular, no murmur Lungs:  B/l rhonchi Abdomen:  Soft, nontender Musculoskeletal:  No edema Skin:  No rashes  LABS:  BMET  Recent Labs Lab 10/10/15 1405 10/12/15 0400 10/12/15 1711 10/12/15 1924  NA 136 131*  --  128*  K 3.3* 2.3* 3.3* 3.4*  CL 101 95*  --  93*  CO2 26 26  --  24  BUN 7 9  --  9  CREATININE 0.72 0.57  --  0.78  GLUCOSE 98 129*  --  163*    Electrolytes  Recent Labs Lab 10/10/15 1405 10/12/15 0400 10/12/15 1924 10/13/15 0512  CALCIUM 9.8 9.0 9.0  --   MG 1.4* 1.5*  --  1.8  PHOS  --   --   --  3.0     CBC  Recent Labs Lab 10/10/15 1405 10/12/15 0400 10/13/15 0512  WBC 7.3 6.1 6.4  HGB 12.0 11.1* 10.5*  HCT 35.6* 32.1* 30.7*  PLT 215 201 153    Coag's  Recent Labs Lab 10/09/15 0228  APTT 29  INR 1.16    Sepsis Markers No results for input(s): LATICACIDVEN, PROCALCITON, O2SATVEN in the last 168 hours.  ABG  Recent Labs Lab 10/10/15 0925 10/12/15 2013 10/13/15 0019  PHART 7.466* 7.520* 7.494*  PCO2ART 31.7* 27.8* 32.0*  PO2ART 76.8* 543* 176.0*    Liver Enzymes  Recent Labs Lab 10/10/15 1405 10/12/15 0400 10/12/15 1924  AST 25 21 26   ALT 14 10* 12*  ALKPHOS 47 40 43  BILITOT 1.1 0.8 1.1  ALBUMIN 3.6 3.0* 3.2*    Cardiac Enzymes No results for input(s): TROPONINI, PROBNP in the last 168 hours.  Glucose  Recent Labs Lab 10/12/15 0700 10/12/15 1207 10/12/15 1720 10/13/15 0106 10/13/15 0422 10/13/15 0829  GLUCAP 123* 133* 117* 93 149* 100*    Imaging Dg Chest Port 1 View  10/13/2015  CLINICAL DATA:  Respiratory failure EXAM: PORTABLE CHEST 1 VIEW COMPARISON:  10/12/2015; 10/08/2015 FINDINGS: Grossly unchanged cardiac silhouette and  mediastinal contours. Interval placement of enteric tube with tip and side port project below the left hemidiaphragm. Stable positioning of endotracheal tube. No pneumothorax. No focal airspace opacities. No pleural effusion or pneumothorax. No evidence of edema. No acute osseus abnormalities. IMPRESSION: 1. Interval placement of enteric tube with tip and side port projecting below the left hemidiaphragm. Otherwise, stable positioning remaining support apparatus. No pneumothorax. 2.  No acute cardiopulmonary disease. Electronically Signed   By: Sandi Mariscal M.D.   On: 10/13/2015 08:30   Dg Chest Port 1 View  10/12/2015  CLINICAL DATA:  Endotracheal tube placement. EXAM: PORTABLE CHEST 1 VIEW COMPARISON:  Chest x-ray dated 10/11/2015 and chest x-ray dated 10/08/2015. FINDINGS: Endotracheal tube has been placed with  tip well positioned approximately 2.5 cm above the level of the carina. Heart size remains normal. Lungs are clear. No pleural effusions seen. No pneumothorax seen. IMPRESSION: Endotracheal tube well positioned with tip approximately 2.5 cm above the level of the carina. Electronically Signed   By: Franki Cabot M.D.   On: 10/12/2015 19:43     STUDIES:  2/06 CT head >> atrophy 2/07 MRI brain >> remote basal ganglia and corona radiata infarcts b/l, remote Rt thalamus and posterior limb internal capsule infarcts, global atrophy 2/08 EEG >> lt temporal region focal seizures, lt hemisphere slowing 2/09 LP >> 140 RBC, 2 WBC, protein 38, glucose 51  CULTURES: 2/07 Urine >> diphtheroids 2/07 Blood >> 2/09 CSF >>NGTD>>  ANTIBIOTICS: 2/06 Levaquin >>off 2/09 Acyclovir >> 2/11 2/10 Vanco>>  SIGNIFICANT EVENTS: 2/06 Admit 2/08 Noted to have seizure, neuro consulted 2/10 Status epilepticus >> to ICU, diprivan coma  LINES/TUBES: 2/10 ETT >>   DISCUSSION: 82 yo female with acute encephalopathy and status epilepticus.  She has hx of CVA's and polypharmacy.  She required intubation for diprivan coma to control seizures. 2/11 weaning off diprivan, no further szs.  ASSESSMENT / PLAN:  PULMONARY A: Acute respiratory failure in setting of status epilepticus. P:   Full vent support F/u CXR, ABG 2/11 sbt WHEN OFF SEDATION  CARDIOVASCULAR A:  Hx of HTN, HLD. P:  Continue zocor Monitor hemodynamics  RENAL  Recent Labs Lab 10/12/15 0400 10/12/15 1711 10/12/15 1924  K 2.3* 3.3* 3.4*     Recent Labs Lab 10/10/15 1405 10/12/15 0400 10/12/15 1924  NA 136 131* 128*    A:   Hypokalemia. Hyponatremia. P:   Replace electrolytes as needed  GASTROINTESTINAL A:   Nutrition. Chronic abdominal pain. P:   Tube feeds while on vent Protonix for SUP  HEMATOLOGIC A:   No acute issues. P:  F/u CBC Lovenox for DVT prevention  INFECTIOUS A:   ?UTI. ?Encephalitis. P:    Abx as above  ENDOCRINE A:   No acute issues. P:   Monitor CBGs  NEUROLOGIC A:   Acute encephalopathy 2nd to status epilepticus. Hx of anxiety, depression on chronic benzo's. Hx of CVA. P:   AEDs per neurology Weaning off diprivan, EEG completed  Richardson Landry Minor ACNP Maryanna Shape PCCM Pager [phone] till 3 pm If no answer page [phone] 10/13/2015, 9:06 AM  STAFF NOTE: I, Merrie Roof, MD FACP have personally reviewed patient's available data, including medical history, events of note, physical examination and test results as part of my evaluation. I have discussed with resident/NP and other care providers such as pharmacist, RN and RRT. In addition, I personally evaluated patient and elicited key findings of: deep induced coma noted, LP reviewed, MRI reviewed, ABg reviewed, reduce rate slight,  unable to sbt with current prop needs, continues to have seiziures, prognosis concerning, in am plan wake up with prop and assess eeg at time, feed pt to goal, supp mag, k was low last night, need repeat assessment, dvt prev, unclear etiology vanc need, dc The patient is critically ill with multiple organ systems failure and requires high complexity decision making for assessment and support, frequent evaluation and titration of therapies, application of advanced monitoring technologies and extensive interpretation of multiple databases.   Critical Care Time devoted to patient care services described in this note is 30 Minutes. This time reflects time of care of this signee: Merrie Roof, MD FACP. This critical care time does not reflect procedure time, or teaching time or supervisory time of PA/NP/Med student/Med Resident etc but could involve care discussion time. Rest per NP/medical resident whose note is outlined above and that I agree with   Lavon Paganini. Titus Mould, MD, [place] Pgr: [facility] 10/13/2015 3:03 PM  '

## 2015-10-13 NOTE — Progress Notes (Signed)
Patient transferred to [facility] from [facility].  SNF search has been initiated but will need to monitor and assist pending stability.  Written report provided for unit CSW to follow up on Monday.  Lorie Phenix. Pauline Good, [place] (weekend coverage)

## 2015-10-13 NOTE — Progress Notes (Signed)
Checked Pt's head for breakdown and leads for integrity. Notified Dr Edison Simon that LTM will be continued thru the weekend.

## 2015-10-13 NOTE — Procedures (Signed)
Continuous Video-EEG Monitoring Report  Patient: Kathryn [name], Kathryn [name]     EEG No.ID: [id number]     DOB: August 06, 1931 Age: 82 Room#2C11  MED REC NO: [id number] Gender: Female   TECH: Wyman Songster     Physician: Winfield Cunas     Referring Physician: Jim Like   Report Date: 10/13/2015    Study Duration:10/12/2015 07:30 to 10/13/2015 07:30 CPT [id number] Diagnosis:Altered mental status (R41.82); Seizures (R56.9)  History: This is an 82 year old female presenting with altered mental status with visual hallucinaiton. Video-EEG monitoring was performed to evaluate for seizures.  Technical Details: Long-term video-EEG monitoring was performed using standard setting per the guidelines. Briefly, a minimum of 21 electrodes were placed on scalp according to the International 10-20 or/and 10-10 Systems. Supplemental electrodes were placed as needed. Single EKG electrode was also used to detect cardiac arrhythmia. Patient's behavior was continuously recorded on video simultaneously with EEG. A minimum of 16 channels were used for data display. Each epoch of study was reviewed manually daily and as needed using standard referential and bipolar montages.   EEG Description: Initially, there was generalized polymorphic delta and theta slowing. Diffuse beta activity was also seen, which could be due to medication effect (e.g., benzodiazepine). No posterior dominant rhythm or sleep architecture was recorded. Frequent focal sharp waves were present in the left temporal region with phase reversal at T3-T5. These left temporal sharp waves appeared periodic at times (about 0.5-1 Hz), consistent with periodic lateralized epileptiform discharges (PLEDs). Frequent focal convulsive seizures were captured at 10/12/2015 13:24, 14:10, 14:55, 15:08, 15:22, 15:41, 15:55, 16:10, 16:36, 16:39, 17:13 (the ictal onset was obscured by muscle artifacts, but  very likely to be left temporal region).  Facial deviation and twitching (mainly on the right side), eye blinking and vocalization was associated with the seizures.  After around 10/12/2015 19:00, the seizures became less frequent, most likely due to medication uptitration (patient was intubated around that time).  After around 10/13/2015 01:00, the background became discontinuous, likely due to medication effect (e.g., propofol, benzodiazepine).  Impression: This is an abnormal EEG due to the presence of the following: 1) Generalized polymorphic delta and theta slowing, suggesting moderate to severe encephalopathy, but medication effect could not be excluded; 2) Interictal epileptiform discharges in the left temporal region which appeared as PLEDs at times; 3) Frequent focal convulsive seizures of left frontotemporal onset based on the clinical semiology.  Compared with the last study, current study showed overall much more frequent seizures, suggesting interval deterioration.  However, seizures eventually subsided after intubation and medication uptitration.     [place] Physician: Winfield Cunas, MD, PhD

## 2015-10-13 NOTE — Progress Notes (Signed)
Patient BP dropped to 53/32 after administration of cerebryx. Dr. Ashok Cordia notified. Order received from bolus and neo. Will begin and monitor.

## 2015-10-13 NOTE — Care Management Note (Signed)
Covering overnight for Dr. Janann Colonel.  Received phone call from patient's nurse that patient has been having rhythmic twitching of her jaw as well as some intermittent limb twitching noted by nursing staff. Was unable to review patient's long-term EEG remotely due to some sever connection issue. Reviewed the EEG at bedside briefly, frequent left frontotemporal abnormal discharges were noted, without definite evidence of electrographic seizures at the time. Patient had continuous rhythmic appearing jaw tremor.  Based on continued abnormalities noted on the EEG, ordered loading dose of IV fosphenytoin 20 mg/kg. Check Dilantin level tomorrow morning. Will defer ordering maintenance fosphenytoin to Dr. Janann Colonel who will be rounding tomorrow morning. She is currently on propofol infusion at rate of 30 mcg/kg/min, and on maintenance dose of Keppra 1 g twice a day and Vimpat 100 mg twice a day.    Filed Vitals:   10/12/15 2300 10/12/15 2338  BP: 154/72   Pulse: 77 80  Temp:    Resp: 25 14     Current facility-administered medications:  .  0.9 %  sodium chloride infusion, , Intravenous, Continuous, Chesley Mires, MD, Last Rate: 50 mL/hr at 10/12/15 2103 .  acetaminophen (TYLENOL) solution 650 mg, 650 mg, Oral, Q6H PRN, Chesley Mires, MD .  acyclovir (ZOVIRAX) 500 mg in dextrose 5 % 100 mL IVPB, 500 mg, Intravenous, Q12H, Eudelia Bunch, RPH, 500 mg at 10/12/15 1400 .  antiseptic oral rinse solution (CORINZ), 7 mL, Mouth Rinse, 10 times per day, Chesley Mires, MD, 7 mL at 10/12/15 2335 .  bisacodyl (DULCOLAX) suppository 10 mg, 10 mg, Rectal, Daily PRN, Chesley Mires, MD .  cefTAZidime (FORTAZ) 1 g in dextrose 5 % 50 mL IVPB, 1 g, Intravenous, Q12H, Gay Filler Carney, RPH, 1 g at 10/12/15 2104 .  chlorhexidine gluconate (PERIDEX) 0.12 % solution 15 mL, 15 mL, Mouth Rinse, BID, Chesley Mires, MD, 15 mL at 10/12/15 2106 .  enoxaparin (LOVENOX) injection 40 mg, 40 mg, Subcutaneous, Q24H, Drema Dallas, DO, 40 mg at  10/12/15 [id number] .  feeding supplement (VITAL HIGH PROTEIN) liquid 1,000 mL, 1,000 mL, Per Tube, Q24H, Chesley Mires, MD, 1,000 mL at 10/12/15 2219 .  fentaNYL (SUBLIMAZE) injection 50 mcg, 50 mcg, Intravenous, Q2H PRN, Chesley Mires, MD .  fosPHENYtoin (CEREBYX) 1,188 mg PE in sodium chloride 0.9 % 50 mL IVPB, 20 mg PE/kg, Intravenous, Once, Omara Alcon Fuller Mandril, MD .  hydrALAZINE (APRESOLINE) injection 5 mg, 5 mg, Intravenous, Q2H PRN, Ivor Costa, MD, 5 mg at 10/12/15 1636 .  insulin aspart (novoLOG) injection 0-15 Units, 0-15 Units, Subcutaneous, 6 times per day, Chesley Mires, MD, 0 Units at 10/12/15 2000 .  lacosamide (VIMPAT) 100 mg in sodium chloride 0.9 % 25 mL IVPB, 100 mg, Intravenous, Q12H, Drema Dallas, DO, 100 mg at 10/12/15 1636 .  levETIRAcetam (KEPPRA) 1,000 mg in sodium chloride 0.9 % 100 mL IVPB, 1,000 mg, Intravenous, Q12H, Drema Dallas, DO .  LORazepam (ATIVAN) injection 0.5 mg, 0.5 mg, Intravenous, Q4H PRN, Ivor Costa, MD, 0.5 mg at 10/10/15 0423 .  midazolam (VERSED) injection 1 mg, 1 mg, Intravenous, Q15 min PRN, Chesley Mires, MD .  midazolam (VERSED) injection 1 mg, 1 mg, Intravenous, Q2H PRN, Chesley Mires, MD .  nystatin-triamcinolone (MYCOLOG II) cream 1 application, 1 application, Topical, BID, Lauren D Bajbus, RPH, 1 application at [id number] .  [DISCONTINUED] ondansetron (ZOFRAN) tablet 4 mg, 4 mg, Oral, Q6H PRN **OR** ondansetron (ZOFRAN) injection 4 mg, 4 mg, Intravenous, Q6H PRN, Ivor Costa, MD .  pantoprazole sodium (PROTONIX) 40 mg/20 mL oral suspension 40 mg, 40 mg, Per Tube, Q24H, Chesley Mires, MD, 40 mg at 10/12/15 2151 .  propofol (DIPRIVAN) 1000 MG/100ML infusion, 0-50 mcg/kg/min, Intravenous, Continuous, Chesley Mires, MD, Last Rate: 10.7 mL/hr at 10/12/15 2300, 30 mcg/kg/min at 10/12/15 2300 .  sennosides (SENOKOT) 8.8 MG/5ML syrup 5 mL, 5 mL, Per Tube, BID PRN, Chesley Mires, MD .  simvastatin (ZOCOR) tablet 40 mg, 40 mg, Per Tube, q morning - 10a, Chesley Mires,  MD .  sodium chloride flush (NS) 0.9 % injection 3 mL, 3 mL, Intravenous, Q12H, Ivor Costa, MD, 3 mL at 10/12/15 [id number] .  vancomycin (VANCOCIN) 500 mg in sodium chloride 0.9 % 100 mL IVPB, 500 mg, Intravenous, Q12H, Gay Filler Carney, RPH, 500 mg at 10/12/15 2141 .  vitamin B-12 (CYANOCOBALAMIN) tablet 1,000 mcg, 1,000 mcg, Per Tube, Daily, Chesley Mires, MD .  vitamin C (ASCORBIC ACID) tablet 500 mg, 500 mg, Per Tube, Daily, Chesley Mires, MD .  vitamin e oil OIL 200 Units, 200 Units, Per Tube, Daily, Chesley Mires, MD

## 2015-10-13 NOTE — Progress Notes (Signed)
Dr. Ashok Cordia notified of BP fluctuation on cuff and need for neo having to be started. Order received for arterial line for continuous BP monitoring.

## 2015-10-14 ENCOUNTER — Inpatient Hospital Stay (HOSPITAL_COMMUNITY): Payer: Medicare Other

## 2015-10-14 LAB — BASIC METABOLIC PANEL
ANION GAP: 9 (ref 5–15)
ANION GAP: 10 (ref 5–15)
BUN: 18 mg/dL (ref 6–20)
BUN: 16 mg/dL (ref 6–20)
CALCIUM: 8.5 mg/dL — AB (ref 8.9–10.3)
CALCIUM: 8.8 mg/dL — AB (ref 8.9–10.3)
CO2: 23 mmol/L (ref 22–32)
CO2: 24 mmol/L (ref 22–32)
Chloride: 106 mmol/L (ref 101–111)
Chloride: 104 mmol/L (ref 101–111)
Creatinine, Ser: 0.74 mg/dL (ref 0.44–1.00)
Creatinine, Ser: 0.65 mg/dL (ref 0.44–1.00)
GFR calc Af Amer: >60 mL/min (ref >60)
GFR calc non Af Amer: >60 mL/min (ref >60)
GLUCOSE: 117 mg/dL — AB (ref 65–99)
Glucose, Bld: 155 mg/dL — ABNORMAL HIGH (ref 65–99)
POTASSIUM: 2.8 mmol/L — AB (ref 3.5–5.1)
Potassium: 4.1 mmol/L (ref 3.5–5.1)
Sodium: 138 mmol/L (ref 135–145)
Sodium: 138 mmol/L (ref 135–145)

## 2015-10-14 LAB — CULTURE, BLOOD (ROUTINE X 2)
CULTURE: NO GROWTH
Culture: NO GROWTH

## 2015-10-14 LAB — PHOSPHORUS: Phosphorus: 1.9 mg/dL — ABNORMAL LOW (ref 2.5–4.6)

## 2015-10-14 LAB — CSF CULTURE: CULTURE: NO GROWTH

## 2015-10-14 LAB — CSF CULTURE W GRAM STAIN

## 2015-10-14 LAB — CBC
HEMATOCRIT: 34.6 % — AB (ref 36.0–46.0)
HEMOGLOBIN: 11.6 g/dL — AB (ref 12.0–15.0)
MCH: 29.7 pg (ref 26.0–34.0)
MCHC: 33.5 g/dL (ref 30.0–36.0)
MCV: 88.7 fL (ref 78.0–100.0)
Platelets: 166 10*3/uL (ref 150–400)
RBC: 3.9 MIL/uL (ref 3.87–5.11)
RDW: 12.8 % (ref 11.5–15.5)
WBC: 7.2 10*3/uL (ref 4.0–10.5)

## 2015-10-14 LAB — GLUCOSE, CAPILLARY
GLUCOSE-CAPILLARY: 102 mg/dL — AB (ref 65–99)
GLUCOSE-CAPILLARY: 111 mg/dL — AB (ref 65–99)
GLUCOSE-CAPILLARY: 123 mg/dL — AB (ref 65–99)
GLUCOSE-CAPILLARY: 88 mg/dL (ref 65–99)
Glucose-Capillary: 142 mg/dL — ABNORMAL HIGH (ref 65–99)
Glucose-Capillary: 136 mg/dL — ABNORMAL HIGH (ref 65–99)

## 2015-10-14 LAB — ENTEROVIRUS PCR: ENTEROVIRUS PCR: NEGATIVE

## 2015-10-14 LAB — MAGNESIUM: Magnesium: 2.3 mg/dL (ref 1.7–2.4)

## 2015-10-14 LAB — SEDIMENTATION RATE: SED RATE: 57 mm/h — AB (ref 0–22)

## 2015-10-14 MED ORDER — POTASSIUM CHLORIDE 20 MEQ/15ML (10%) PO SOLN
40.0000 meq | ORAL | Status: AC
  Administered 2015-10-14 (×2): 40 meq
  Filled 2015-10-14 (×2): qty 30

## 2015-10-14 MED ORDER — POTASSIUM PHOSPHATES 15 MMOLE/5ML IV SOLN
20.0000 mmol | Freq: Once | INTRAVENOUS | Status: AC
  Administered 2015-10-14: 20 mmol via INTRAVENOUS
  Filled 2015-10-14: qty 6.67

## 2015-10-14 MED ORDER — POTASSIUM CHLORIDE 10 MEQ/100ML IV SOLN
10.0000 meq | INTRAVENOUS | Status: AC
  Administered 2015-10-14 (×4): 10 meq via INTRAVENOUS
  Filled 2015-10-14 (×3): qty 100

## 2015-10-14 NOTE — Procedures (Signed)
Continuous Video-EEG Monitoring Report  Patient: Kathryn [name], Pe[place]     EEG No.ID: [id number]     DOB: [birth year] Age: 82 Room#2C11  MED REC NO: [id number] Gender: Female   TECH: Wyman Songster     Physician: Winfield Cunas     Referring Physician: Jim Like   Report Date: 10/14/2015    Study Duration:10/13/2015 07:30 to 10/14/2015 07:30 CPT [id number] Diagnosis:Altered mental status (R41.82); Seizures (R56.9)  History: This is an 82 year old female presenting with altered mental status with visual hallucinaiton. Video-EEG monitoring was performed to evaluate for seizures.  Technical Details: Long-term video-EEG monitoring was performed using standard setting per the guidelines. Briefly, a minimum of 21 electrodes were placed on scalp according to the International 10-20 or/and 10-10 Systems. Supplemental electrodes were placed as needed. Single EKG electrode was also used to detect cardiac arrhythmia. Patient's behavior was continuously recorded on video simultaneously with EEG. A minimum of 16 channels were used for data display. Each epoch of study was reviewed manually daily and as needed using standard referential and bipolar montages.   EEG Description: The background was discontinuous, consisting of periods of generalized amplitude suppression (<10 uV) and periods of generalized polymorphic delta and theta slowing.  Diffuse beta activity was also present, most likely due to medication effect (e.g., propofol, benzodiazepine).  No posterior dominant rhythm or sleep architecture was recorded. Persistent periodic lateralized epileptiform discharges (PLEDs) were present in the left hemisphere (~1 Hz). No seizures were present.  Impression: This is an abnormal EEG due to the presence of the following: 1) Generalized polymorphic delta and theta slowing and intermittent background  suppression, suggesting severe encephalopathy, but medication effect could not be excluded; 2) PLEDs in the left hemisphere.  No seizures were present.  Compared with the last study, current study showed no seizures, suggesting interval improvement.     Reading Physician: Winfield Cunas, MD, PhD

## 2015-10-14 NOTE — Progress Notes (Signed)
Subjective: No overnight events. Remains intubated, on propofol drip.   No noted clinical or electrographic seizure activity at this time. Awaiting overnight EEG report.   Objective: Current vital signs: BP 153/73 mmHg  Pulse 92  Temp(Src) 98.7 F (37.1 C) (Axillary)  Resp 15  Ht 5' 3"  (1.6 m)  Wt 58.9 kg (129 lb 13.6 oz)  BMI 23.01 kg/m2  SpO2 100% Vital signs in last 24 hours: Temp:  [98.2 F (36.8 C)-98.7 F (37.1 C)] 98.7 F (37.1 C) (02/12 0743) Pulse Rate:  [66-104] 92 (02/12 0930) Resp:  [11-32] 15 (02/12 0930) BP: (94-166)/(52-118) 153/73 mmHg (02/12 0930) SpO2:  [100 %] 100 % (02/12 0930) FiO2 (%):  [40 %] 40 % (02/12 0800) Weight:  [58.9 kg (129 lb 13.6 oz)] 58.9 kg (129 lb 13.6 oz) (02/12 0500)  Intake/Output from previous day: 02/11 0701 - 02/12 0700 In: 3528.7 [I.V.:1831.1; NG/GT:1022.7; IV Piggyback:675] Out: 1685 [Urine:1685] Intake/Output this shift: Total I/O In: 453.6 [I.V.:293.6; NG/GT:160] Out: 50 [Urine:50] Nutritional status: Diet NPO time specified  Neurologic Exam: Mental Status: (on sedation) Eyes closed, resists opening, non-verbal, not following commands Cranial Nerves: II: no blink to threat, pupils equal, round, reactive to light III,IV, VI: ptosis not present, no gaze deviation noted V,VII: face symmetric Motor: No abnormal movements, no jaw tremor noted Sensory: no withdrawal to noxious stimuli  Lab Results: Basic Metabolic Panel:  Recent Labs Lab 10/10/15 1405 10/12/15 0400 10/12/15 1711 10/12/15 1924 10/13/15 0512 10/13/15 1516 10/14/15 0413  NA 136 131*  --  128*  --  136 138  K 3.3* 2.3* 3.3* 3.4*  --  3.1* 2.8*  CL 101 95*  --  93*  --  104 106  CO2 26 26  --  24  --  24 23  GLUCOSE 98 129*  --  163*  --  133* 117*  BUN 7 9  --  9  --  18 18  CREATININE 0.72 0.57  --  0.78  --  0.87 0.74  CALCIUM 9.8 9.0  --  9.0  --  8.8* 8.5*  MG 1.4* 1.5*  --   --  1.8  --  2.3  PHOS  --   --   --   --  3.0  --  1.9*     Liver Function Tests:  Recent Labs Lab 10/08/15 2059 10/10/15 1405 10/12/15 0400 10/12/15 1924  AST 26 25 21 26   ALT 14 14 10* 12*  ALKPHOS 48 47 40 43  BILITOT 1.0 1.1 0.8 1.1  PROT 6.8 6.6 5.8* 6.2*  ALBUMIN 4.1 3.6 3.0* 3.2*    Recent Labs Lab 10/09/15 0228  LIPASE 35    Recent Labs Lab 10/08/15 2100  AMMONIA 16    CBC:  Recent Labs Lab 10/09/15 0229 10/10/15 1405 10/12/15 0400 10/13/15 0512 10/14/15 0413  WBC 5.6 7.3 6.1 6.4 7.2  HGB 11.3* 12.0 11.1* 10.5* 11.6*  HCT 33.3* 35.6* 32.1* 30.7* 34.6*  MCV 89.0 89.9 87.5 87.7 88.7  PLT 214 215 201 153 166    Cardiac Enzymes:  Recent Labs Lab 10/09/15 0035  CKTOTAL 79    Lipid Panel:  Recent Labs Lab 10/09/15 0229 10/12/15 1924  CHOL 181  --   TRIG 45 46  HDL 53  --   CHOLHDL 3.4  --   VLDL 9  --   LDLCALC 119*  --     CBG:  Recent Labs Lab 10/13/15 1633 10/13/15 1943 10/13/15 2340 10/14/15 0443 10/14/15  [place] 111*    Microbiology: Results for orders placed or performed during the hospital encounter of 10/08/15  Urine culture     Status: None   Collection Time: 10/09/15  1:43 AM  Result Value Ref Range Status   Specimen Description URINE, CATHETERIZED  Final   Special Requests NONE  Final   Culture   Final    >=100,000 COLONIES/mL DIPHTHEROIDS(CORYNEBACTERIUM SPECIES) Standardized susceptibility testing for this organism is not available.    Report Status 10/10/2015 FINAL  Final  Culture, blood (Routine X 2) w Reflex to ID Panel     Status: None (Preliminary result)   Collection Time: 10/09/15  5:30 AM  Result Value Ref Range Status   Specimen Description BLOOD LEFT HAND  Final   Special Requests IN PEDIATRIC BOTTLE 1CC  Final   Culture NO GROWTH 4 DAYS  Final   Report Status PENDING  Incomplete  Culture, blood (Routine X 2) w Reflex to ID Panel     Status: None (Preliminary result)   Collection Time: 10/09/15  5:38 AM  Result Value Ref  Range Status   Specimen Description BLOOD LEFT ARM  Final   Special Requests IN PEDIATRIC BOTTLE 1CC  Final   Culture NO GROWTH 4 DAYS  Final   Report Status PENDING  Incomplete  MRSA PCR Screening     Status: None   Collection Time: 10/10/15 11:39 PM  Result Value Ref Range Status   MRSA by PCR NEGATIVE NEGATIVE Final    Comment:        The GeneXpert MRSA Assay (FDA approved for NASAL specimens only), is one component of a comprehensive MRSA colonization surveillance program. It is not intended to diagnose MRSA infection nor to guide or monitor treatment for MRSA infections.   CSF culture     Status: None (Preliminary result)   Collection Time: 10/11/15  1:33 PM  Result Value Ref Range Status   Specimen Description CSF  Final   Special Requests CSF NO 2  Final   Gram Stain   Final    CYTOSPIN SMEAR WBC PRESENT, PREDOMINANTLY PMN NO ORGANISMS SEEN    Culture NO GROWTH 2 DAYS  Final   Report Status PENDING  Incomplete    Coagulation Studies: No results for input(s): LABPROT, INR in the last 72 hours.  Imaging: Dg Chest Port 1 View  10/14/2015  CLINICAL DATA:  Hypoxia EXAM: PORTABLE CHEST 1 VIEW COMPARISON:  October 13, 2015 FINDINGS: Endotracheal tube tip is 3.1 cm above the carina. Nasogastric tube tip and side port are below the diaphragm. No pneumothorax. Lungs are clear. The heart size and pulmonary vascularity are normal. No adenopathy. No bone lesions. IMPRESSION: Tube positions as described without pneumothorax. No edema or consolidation. Electronically Signed   By: Lowella Grip III M.D.   On: 10/14/2015 07:53   Dg Chest Port 1 View  10/13/2015  CLINICAL DATA:  Respiratory failure EXAM: PORTABLE CHEST 1 VIEW COMPARISON:  10/12/2015; 10/08/2015 FINDINGS: Grossly unchanged cardiac silhouette and mediastinal contours. Interval placement of enteric tube with tip and side port project below the left hemidiaphragm. Stable positioning of endotracheal tube. No  pneumothorax. No focal airspace opacities. No pleural effusion or pneumothorax. No evidence of edema. No acute osseus abnormalities. IMPRESSION: 1. Interval placement of enteric tube with tip and side port projecting below the left hemidiaphragm. Otherwise, stable positioning remaining support apparatus. No pneumothorax. 2.  No acute cardiopulmonary disease. Electronically Signed   By: Jenny Reichmann  Watts M.D.   On: 10/13/2015 08:30   Dg Chest Port 1 View  10/12/2015  CLINICAL DATA:  Endotracheal tube placement. EXAM: PORTABLE CHEST 1 VIEW COMPARISON:  Chest x-ray dated 10/11/2015 and chest x-ray dated 10/08/2015. FINDINGS: Endotracheal tube has been placed with tip well positioned approximately 2.5 cm above the level of the carina. Heart size remains normal. Lungs are clear. No pleural effusions seen. No pneumothorax seen. IMPRESSION: Endotracheal tube well positioned with tip approximately 2.5 cm above the level of the carina. Electronically Signed   By: Franki Cabot M.D.   On: 10/12/2015 19:43    Medications: I have reviewed the patient's current medications.  Assessment/Plan:  82y/o woman with hx of HTN, TIA, stroke admitted 2/07 with altered mental status and visual hallucinations. Noted to have complex partial seizures during routine EEG on 2/08. These episodes broke with ativan and she appeared post-ictal.   Remains poorly responsive. CSF workup unremarkable so far, HSV was negative. Enterovirus negative. Further studies pending. MRI brain from 2/07 done without contrast overall unremarkable, question artifact in left midbrain. Question possible autoimmune process though LP unremarkable vs small structural lesion missed on non-contrast MRI. Plan to taper off sedation today, monitor on EEG overnight. If stable can discontinue EEG in the morning. At that time will plan to repeat MRI brain with and without contrast.   -continue LTM EEG with goal to discontinue tomorrow if stable -will taper off propofol  today if clinically stable -continue keppra, vimpat and dilantin -d/c acyclovir on 2/11, awaiting remaining CSF studies -check ANA, ESR, CRP -MRI brain with and without contrast once EEG discontinued      LOS: 5 days   Jim Like, DO [facility] [phone]  If 7pm- 7am, please page neurology on call as listed in [place]. 10/14/2015  9:37 AM

## 2015-10-14 NOTE — Progress Notes (Signed)
[facility] ADULT ICU REPLACEMENT PROTOCOL FOR AM LAB REPLACEMENT ONLY  The patient does apply for the [facility] Adult ICU Electrolyte Replacment Protocol based on the criteria listed below:   1. Is GFR >/= 40 ml/min? Yes.    Patient's GFR today is >60 2. Is urine output >/= 0.5 ml/kg/hr for the last 6 hours? Yes.   Patient's UOP is 0.5 ml/kg/hr 3. Is BUN < 60 mg/dL? Yes.    Patient's BUN today is 18 4. Abnormal electrolyte(s):K 2.8 5. Ordered repletion with: per protocol 6. If a panic level lab has been reported, has the CCM MD in charge been notified? No..   Physician:    Ronda Fairly A 10/14/2015 5:29 AM

## 2015-10-14 NOTE — Progress Notes (Addendum)
PULMONARY / CRITICAL CARE MEDICINE   Name: Kathryn [name] MRN: [id number] DOB: 1931/01/21    ADMISSION DATE:  10/08/2015 CONSULTATION DATE:  10/12/2015  REFERRING MD:  Dr. Janann Colonel, neurology  CHIEF COMPLAINT:  Seizures.  HISTORY OF PRESENT ILLNESS:   Hx from chart.  82 yo female brought to ER due to confusion after falling.  She was also having visual hallucinations.  She was noted to have seizure on 2/08 and neurology consulted.  She was also noted to have fever.  She had LP that was unrevealing.  She was started on abx for possible UTI, and acyclovir for possible encephalitis.  Her seizures persistent, and neuro recommend transfer to ICU for diprivan coma and intubation.  SUBJECTIVE:  Sedated on vent, szs overnight   VITAL SIGNS: BP 149/60 mmHg  Pulse 99  Temp(Src) 98.7 F (37.1 C) (Axillary)  Resp 16  Ht 5\' 3"  (1.6 m)  Wt 129 lb 13.6 oz (58.9 kg)  BMI 23.01 kg/m2  SpO2 100%  HEMODYNAMICS:    VENTILATOR SETTINGS: Vent Mode:  [-] PRVC FiO2 (%):  [40 %] 40 % Set Rate:  [10 bmp-12 bmp] 10 bmp Vt Set:  [450 mL] 450 mL PEEP:  [5 cmH20] 5 cmH20 Plateau Pressure:  [12 cmH20-14 cmH20] 12 cmH20  INTAKE / OUTPUT: I/O last 3 completed shifts: In: 4213.6 [I.V.:2252.3; NG/GT:1066.3; IV Piggyback:895] Out: 1860 [id number]  PHYSICAL EXAMINATION: General:  Ill appearing Neuro:  Sedated , no szs, no focus or follow HEENT: No JVD Cardiovascular:  Regular, no murmur Lungs:  B/l rhonchi Abdomen:  Soft, nontender Musculoskeletal:  No edema Skin:  No rashes  LABS:  BMET  Recent Labs Lab 10/12/15 1924 10/13/15 1516 10/14/15 0413  NA 128* 136 138  K 3.4* 3.1* 2.8*  CL 93* 104 106  CO2 24 24 23   BUN 9 18 18   CREATININE 0.78 0.87 0.74  GLUCOSE 163* 133* 117*    Electrolytes  Recent Labs Lab 10/12/15 0400 10/12/15 1924 10/13/15 0512 10/13/15 1516 10/14/15 0413  CALCIUM 9.0 9.0  --  8.8* 8.5*  MG 1.5*  --  1.8  --  2.3  PHOS  --   --  3.0  --  1.9*     CBC  Recent Labs Lab 10/12/15 0400 10/13/15 0512 10/14/15 0413  WBC 6.1 6.4 7.2  HGB 11.1* 10.5* 11.6*  HCT 32.1* 30.7* 34.6*  PLT 201 153 166    Coag's  Recent Labs Lab 10/09/15 0228  APTT 29  INR 1.16    Sepsis Markers No results for input(s): LATICACIDVEN, PROCALCITON, O2SATVEN in the last 168 hours.  ABG  Recent Labs Lab 10/10/15 0925 10/12/15 2013 10/13/15 0019  PHART 7.466* 7.520* 7.494*  PCO2ART 31.7* 27.8* 32.0*  PO2ART 76.8* 543* 176.0*    Liver Enzymes  Recent Labs Lab 10/10/15 1405 10/12/15 0400 10/12/15 1924  AST 25 21 26   ALT 14 10* 12*  ALKPHOS 47 40 43  BILITOT 1.1 0.8 1.1  ALBUMIN 3.6 3.0* 3.2*    Cardiac Enzymes No results for input(s): TROPONINI, PROBNP in the last 168 hours.  Glucose  Recent Labs Lab 10/13/15 1148 10/13/15 1633 10/13/15 1943 10/13/15 2340 10/14/15 0443 10/14/15 0739  GLUCAP 104* 104* 125* 102* 88 111*    Imaging Dg Chest Port 1 View  10/14/2015  CLINICAL DATA:  Hypoxia EXAM: PORTABLE CHEST 1 VIEW COMPARISON:  October 13, 2015 FINDINGS: Endotracheal tube tip is 3.1 cm above the carina. Nasogastric tube tip and side port are below  the diaphragm. No pneumothorax. Lungs are clear. The heart size and pulmonary vascularity are normal. No adenopathy. No bone lesions. IMPRESSION: Tube positions as described without pneumothorax. No edema or consolidation. Electronically Signed   By: Lowella Grip III M.D.   On: 10/14/2015 07:53     STUDIES:  2/06 CT head >> atrophy 2/07 MRI brain >> remote basal ganglia and corona radiata infarcts b/l, remote Rt thalamus and posterior limb internal capsule infarcts, global atrophy 2/08 EEG >> lt temporal region focal seizures, lt hemisphere slowing 2/09 LP >> 140 RBC, 2 WBC, protein 38, glucose 51   CULTURES: 2/07 Urine >> diphtheroids 2/07 Blood >>NGTD>> 2/09 CSF >>NGTD>>  ANTIBIOTICS: 2/06 Levaquin >>off 2/09 Acyclovir >> 2/11 2/10 Vanco>>off 2/10  ceftaz>> 2/11 sputum>>   SIGNIFICANT EVENTS: 2/06 Admit 2/08 Noted to have seizure, neuro consulted 2/10 Status epilepticus >> to ICU, diprivan coma  LINES/TUBES: 2/10 ETT >>   DISCUSSION: 82 yo female with acute encephalopathy and status epilepticus.  She has hx of CVA's and polypharmacy.  She required intubation for diprivan coma to control seizures. 2/12 recurrent szs with diprivan dosed decreased. Dilantin added 2/12.  ASSESSMENT / PLAN:  PULMONARY A: Acute respiratory failure in setting of status epilepticus. P:   Full vent support F/u CXR, ABG 2/12 sbt WHEN OFF SEDATION  CARDIOVASCULAR A:  Hx of HTN, HLD. P:  Continue zocor Monitor hemodynamics  RENAL  Recent Labs Lab 10/12/15 1924 10/13/15 1516 10/14/15 0413  K 3.4* 3.1* 2.8*     Recent Labs Lab 10/12/15 1924 10/13/15 1516 10/14/15 0413  NA 128* 136 138    A:   Hypokalemia. Hyponatremia. P:   Replace electrolytes as needed  GASTROINTESTINAL A:   Nutrition. Chronic abdominal pain. P:   Tube feeds while on vent Protonix for SUP  HEMATOLOGIC A:   No acute issues. P:  F/u CBC Lovenox for DVT prevention  INFECTIOUS A:   ?UTI. ?Encephalitis. P:   Abx as above  ENDOCRINE A:   No acute issues. P:   Monitor CBGs  NEUROLOGIC A:   Acute encephalopathy 2nd to status epilepticus. Hx of anxiety, depression on chronic benzo's. Hx of CVA. Breakthrough szs when diprivan decreased P:   AEDs per neurology On  diprivan,  Dilantin added  Richardson Landry Minor ACNP Maryanna Shape PCCM Pager [phone] till 3 pm If no answer page [phone] 10/14/2015, 10:44 AM   STAFF NOTE: I, Merrie Roof, MD FACP have personally reviewed patient's available data, including medical history, events of note, physical examination and test results as part of my evaluation. I have discussed with resident/NP and other care providers such as pharmacist, RN and RRT. In addition, I personally evaluated patient and  elicited key findings of: trying to open eyes maybe, noted some arm movement minimal,m concern seizure as pulsatile, pcxr remains without infiltrate, WUA on going with prop to off today per neuro, k noted supp aggressive and phos, for MRI in am with contrast per neuro, concern is daily pos balance, 1.8 last 24 hrs, will add lasix to even a balance, would want to wean after prop to off, ps 10 start attempts, follow ltyes further, no fevers, no source infection, dc ceftaz The patient is critically ill with multiple organ systems failure and requires high complexity decision making for assessment and support, frequent evaluation and titration of therapies, application of advanced monitoring technologies and extensive interpretation of multiple databases.   Critical Care Time devoted to patient care services described in this note is  30 Minutes. This time reflects time of care of this signee: Merrie Roof, MD FACP. This critical care time does not reflect procedure time, or teaching time or supervisory time of PA/NP/Med student/Med Resident etc but could involve care discussion time. Rest per NP/medical resident whose note is outlined above and that I agree with   Lavon Paganini. Titus Mould, MD, [place] Pgr: [facility] 10/14/2015 11:53 AM

## 2015-10-15 ENCOUNTER — Inpatient Hospital Stay (HOSPITAL_COMMUNITY): Payer: Medicare Other

## 2015-10-15 DIAGNOSIS — J9601 Acute respiratory failure with hypoxia: Secondary | ICD-10-CM

## 2015-10-15 LAB — CBC
HCT: 29.8 % — ABNORMAL LOW (ref 36.0–46.0)
HEMOGLOBIN: 9.9 g/dL — AB (ref 12.0–15.0)
MCH: 30 pg (ref 26.0–34.0)
MCHC: 33.2 g/dL (ref 30.0–36.0)
MCV: 90.3 fL (ref 78.0–100.0)
Platelets: 192 10*3/uL (ref 150–400)
RBC: 3.3 MIL/uL — AB (ref 3.87–5.11)
RDW: 13.3 % (ref 11.5–15.5)
WBC: 6.1 10*3/uL (ref 4.0–10.5)

## 2015-10-15 LAB — BASIC METABOLIC PANEL
ANION GAP: 9 (ref 5–15)
BUN: 15 mg/dL (ref 6–20)
CALCIUM: 8.5 mg/dL — AB (ref 8.9–10.3)
CO2: 26 mmol/L (ref 22–32)
Chloride: 102 mmol/L (ref 101–111)
Creatinine, Ser: 0.57 mg/dL (ref 0.44–1.00)
Glucose, Bld: 128 mg/dL — ABNORMAL HIGH (ref 65–99)
Potassium: 3.6 mmol/L (ref 3.5–5.1)
Sodium: 137 mmol/L (ref 135–145)

## 2015-10-15 LAB — VARICELLA-ZOSTER BY PCR: VARICELLA-ZOSTER, PCR: NEGATIVE

## 2015-10-15 LAB — GLUCOSE, CAPILLARY
GLUCOSE-CAPILLARY: 114 mg/dL — AB (ref 65–99)
GLUCOSE-CAPILLARY: 118 mg/dL — AB (ref 65–99)
Glucose-Capillary: 131 mg/dL — ABNORMAL HIGH (ref 65–99)
Glucose-Capillary: 136 mg/dL — ABNORMAL HIGH (ref 65–99)
Glucose-Capillary: 111 mg/dL — ABNORMAL HIGH (ref 65–99)
Glucose-Capillary: 121 mg/dL — ABNORMAL HIGH (ref 65–99)

## 2015-10-15 LAB — PHOSPHORUS: PHOSPHORUS: 2.9 mg/dL (ref 2.5–4.6)

## 2015-10-15 LAB — TRIGLYCERIDES: Triglycerides: 59 mg/dL (ref <150)

## 2015-10-15 LAB — MAGNESIUM: MAGNESIUM: 1.7 mg/dL (ref 1.7–2.4)

## 2015-10-15 MED ORDER — SODIUM CHLORIDE 0.9 % IV SOLN
100.0000 mg | Freq: Once | INTRAVENOUS | Status: AC
  Administered 2015-10-15: 100 mg via INTRAVENOUS
  Filled 2015-10-15: qty 10

## 2015-10-15 MED ORDER — SODIUM CHLORIDE 0.9 % IV SOLN
1500.0000 mg | Freq: Two times a day (BID) | INTRAVENOUS | Status: DC
  Administered 2015-10-15: 1500 mg via INTRAVENOUS
  Filled 2015-10-15 (×4): qty 15

## 2015-10-15 MED ORDER — MAGNESIUM SULFATE 2 GM/50ML IV SOLN
2.0000 g | Freq: Once | INTRAVENOUS | Status: AC
Start: 2015-10-15 — End: 2015-10-15
  Administered 2015-10-15: 2 g via INTRAVENOUS
  Filled 2015-10-15: qty 50

## 2015-10-15 MED ORDER — POTASSIUM CHLORIDE 20 MEQ/15ML (10%) PO SOLN
20.0000 meq | ORAL | Status: AC
  Administered 2015-10-15: 20 meq
  Filled 2015-10-15: qty 15

## 2015-10-15 MED ORDER — VITAL AF 1.2 CAL PO LIQD
1000.0000 mL | ORAL | Status: DC
  Administered 2015-10-15 – 2015-10-24 (×10): 1000 mL
  Filled 2015-10-15 (×2): qty 1000

## 2015-10-15 MED ORDER — SODIUM CHLORIDE 0.9 % IV SOLN
150.0000 mg | Freq: Two times a day (BID) | INTRAVENOUS | Status: DC
  Administered 2015-10-15: 150 mg via INTRAVENOUS
  Filled 2015-10-15 (×5): qty 15

## 2015-10-15 MED ORDER — SODIUM CHLORIDE 0.9 % IV SOLN
1000.0000 mg | Freq: Once | INTRAVENOUS | Status: AC
  Administered 2015-10-15: 1000 mg via INTRAVENOUS
  Filled 2015-10-15: qty 10

## 2015-10-15 NOTE — Progress Notes (Signed)
[facility] ADULT ICU REPLACEMENT PROTOCOL FOR AM LAB REPLACEMENT ONLY  The patient does apply for the [facility] Adult ICU Electrolyte Replacment Protocol based on the criteria listed below:   1. Is GFR >/= 40 ml/min? Yes.    Patient's GFR today is >60 2. Is urine output >/= 0.5 ml/kg/hr for the last 6 hours? Yes.   Patient's UOP is 1.3 ml/kg/hr 3. Is BUN < 60 mg/dL? Yes.    Patient's BUN today is 15 4. Abnormal electrolyte(s):K 3.6, Mag 1.7 5. Ordered repletion with: per protocol 6. If a panic level lab has been reported, has the CCM MD in charge been notified? No..   Physician:    Ronda Fairly A 10/15/2015 6:15 AM

## 2015-10-15 NOTE — Progress Notes (Signed)
Interval History:                                                                                                                      Kathryn [name] is an 82 y.o. female patient who presented with status epilepticus, LTM EEG continues to show left sided PLEDS. Due to lack of clear evolution pattern, difficult to say if they represent ictal pattern at this time.  No change clinically. Remains intubated.     Past Medical History: Past Medical History  Diagnosis Date  . Depression   . Hypertension   . High cholesterol   . Rectal prolapse   . Hx of adenomatous colonic polyps   . Internal hemorrhoids   . Hypothyroidism   . Fecal incontinence   . TIA (transient ischemic attack)   . History of frequent urinary tract infections     recent  . Migraine   . Fatty liver 03/20/13  . Generalized ischemic cerebrovascular disease 10/09/2015  . Stroke [facility]) Sept 1, 2010; Sept 12, 2011    Past Surgical History  Procedure Laterality Date  . Colon surgery  2010, for prolapsed organs after hystecrtomy surgery  . Appendectomy  2008  . Cholecystectomy    . Lumbar laminectomy/decompression microdiscectomy N/A 01/07/2013    Procedure: LUMBAR LAMINECTOMY CENTRAL DECOMPRESSION L4-L5, BILATERAL FORAMENOTOMY L4,L5    ;  Surgeon: Tobi Bastos, MD;  Location: [facility];  Service: Orthopedics;  Laterality: N/A;  . Back surgery    . Hemorrhoid surgery  09/2008    Archie Endo 12/31/2010  . Abdominal hysterectomy  05/2007    Archie Endo 01/02/2011    Family History: Family History  Problem Relation Age of Onset  . Stroke Father   . Migraines Father   . CVA Father   . Heart attack Father   . Hypertension Maternal Aunt     x3  . Colon cancer Neg Hx   . CVA Mother     Social History:   reports that she has never smoked. She has never used smokeless tobacco. She reports that she does not drink alcohol or use illicit drugs.  Allergies:  Allergies  Allergen Reactions  . Augmentin [Amoxicillin-Pot Clavulanate] Swelling  and Diarrhea    Throat   . Citalopram Swelling    Eyes ears and throat swelling Throat and eyes   . Codeine Anaphylaxis and Shortness Of Breath  . Fish-Derived Products Anaphylaxis  . Hyoscyamine Anaphylaxis and Swelling  . Ibuprofen Swelling    Throat and eyes   . Latex Swelling and Rash    Swelling to troat  . Lipitor [Atorvastatin] Swelling    Muscle aches throat  . Septra [Bactrim] Anaphylaxis  . Shellfish Allergy Anaphylaxis  . Miconazole Rash  . Keflet [Cephalexin] Diarrhea    Upset stomach  . Meloxicam Diarrhea and Nausea And Vomiting  . Sulfa Antibiotics     Rash and also throat was tight  . Verapamil     Tachycardia and flushing  . Vicodin [Hydrocodone-Acetaminophen]  Other (See Comments)    stroke  . Zoloft [Sertraline Hcl] Swelling    Red spots all over face, swelling of tongue and legs.   . Ciprofloxacin Nausea And Vomiting    Other reaction(s): Abdominal Pain  . Depakene [Valproate Sodium] Hives    All over body   . Isoptin Sr [Verapamil Hcl Er] Cough  . Lisinopril Cough    Fatigue and cough  . Telmisartan-Hctz Cough     Medications:                                                                                                                         Current facility-administered medications:  .  0.9 %  sodium chloride infusion, , Intravenous, Continuous, Chesley Mires, MD, Last Rate: 50 mL/hr at 10/15/15 1600 .  Place/Maintain arterial line, , , Until Discontinued **AND** 0.9 %  sodium chloride infusion, , Intra-arterial, PRN, Javier Glazier, MD, Stopped at 10/14/15 1151 .  acetaminophen (TYLENOL) solution 650 mg, 650 mg, Oral, Q6H PRN, Chesley Mires, MD .  antiseptic oral rinse solution (CORINZ), 7 mL, Mouth Rinse, 10 times per day, Chesley Mires, MD, 7 mL at 10/15/15 1716 .  bisacodyl (DULCOLAX) suppository 10 mg, 10 mg, Rectal, Daily PRN, Chesley Mires, MD .  chlorhexidine gluconate (PERIDEX) 0.12 % solution 15 mL, 15 mL, Mouth Rinse, BID, Chesley Mires,  MD, 15 mL at 10/15/15 0734 .  enoxaparin (LOVENOX) injection 40 mg, 40 mg, Subcutaneous, Q24H, Drema Dallas, DO, 40 mg at 10/15/15 [id number] .  feeding supplement (VITAL AF 1.2 CAL) liquid 1,000 mL, 1,000 mL, Per Tube, Continuous, Brand Males, MD, Last Rate: 50 mL/hr at 10/15/15 1715, 1,000 mL at 10/15/15 1715 .  fentaNYL (SUBLIMAZE) injection 50 mcg, 50 mcg, Intravenous, Q2H PRN, Chesley Mires, MD .  hydrALAZINE (APRESOLINE) injection 5 mg, 5 mg, Intravenous, Q2H PRN, Ivor Costa, MD, 5 mg at 10/15/15 1422 .  insulin aspart (novoLOG) injection 0-15 Units, 0-15 Units, Subcutaneous, 6 times per day, Chesley Mires, MD, 2 Units at 10/15/15 1148 .  lacosamide (VIMPAT) 150 mg in sodium chloride 0.9 % 25 mL IVPB, 150 mg, Intravenous, Q12H, Bria Portales Fuller Mandril, MD .  levETIRAcetam (KEPPRA) 1,500 mg in sodium chloride 0.9 % 100 mL IVPB, 1,500 mg, Intravenous, Q12H, Skyelynn Rambeau Fuller Mandril, MD .  LORazepam (ATIVAN) injection 0.5 mg, 0.5 mg, Intravenous, Q4H PRN, Ivor Costa, MD, 0.5 mg at 10/10/15 0423 .  midazolam (VERSED) injection 1 mg, 1 mg, Intravenous, Q15 min PRN, Chesley Mires, MD .  midazolam (VERSED) injection 1 mg, 1 mg, Intravenous, Q2H PRN, Chesley Mires, MD .  nystatin-triamcinolone (MYCOLOG II) cream 1 application, 1 application, Topical, BID, Lauren D Bajbus, RPH, 1 application at [id number] [phone] .  [DISCONTINUED] ondansetron (ZOFRAN) tablet 4 mg, 4 mg, Oral, Q6H PRN **OR** ondansetron (ZOFRAN) injection 4 mg, 4 mg, Intravenous, Q6H PRN, Ivor Costa, MD .  pantoprazole sodium (PROTONIX) 40 mg/20 mL oral suspension 40 mg, 40 mg, Per Tube, Q24H,  Chesley Mires, MD, 40 mg at 10/14/15 2058 .  phenylephrine (NEO-SYNEPHRINE) 10 mg in dextrose 5 % 250 mL (0.04 mg/mL) infusion, 0-100 mcg/min, Intravenous, Titrated, Javier Glazier, MD, Stopped at 10/13/15 [phone] .  phenytoin (DILANTIN) injection 100 mg, 100 mg, Intravenous, Q8H, Wynell Balloon, RPH, 100 mg at 10/15/15 1148 .  propofol (DIPRIVAN) 1000  MG/100ML infusion, 0-50 mcg/kg/min, Intravenous, Continuous, Chesley Mires, MD, Stopped at 10/14/15 1300 .  sennosides (SENOKOT) 8.8 MG/5ML syrup 5 mL, 5 mL, Per Tube, BID PRN, Chesley Mires, MD, 5 mL at 10/15/15 0923 .  simvastatin (ZOCOR) tablet 40 mg, 40 mg, Per Tube, q morning - 10a, Chesley Mires, MD, 40 mg at 10/15/15 0923 .  sodium chloride flush (NS) 0.9 % injection 3 mL, 3 mL, Intravenous, Q12H, Ivor Costa, MD, 3 mL at 10/14/15 2100 .  vitamin B-12 (CYANOCOBALAMIN) tablet 1,000 mcg, 1,000 mcg, Per Tube, Daily, Chesley Mires, MD, 1,000 mcg at 10/15/15 0926 .  vitamin C (ASCORBIC ACID) tablet 500 mg, 500 mg, Per Tube, Daily, Chesley Mires, MD, 500 mg at 10/15/15 0924 .  vitamin e oil OIL 200 Units, 200 Units, Per Tube, Daily, Chesley Mires, MD, 200 Units at 10/15/15 [id number]   Neurologic Examination:                                                                                                      Blood pressure 177/64, pulse 102, temperature 98.1 F (36.7 C), temperature source Axillary, resp. rate 16, height 5\' 3"  (1.6 m), weight 58.8 kg (129 lb 10.1 oz), SpO2 100 %.  Intubated, sedated with propofol.  Preserved brain stem reflexes, with reactive pupils, corneal's, and gag.  Minimal withdrawal to stimulation in all limbs.    Lab Results: Basic Metabolic Panel:  Recent Labs Lab 10/10/15 1405 10/12/15 0400  10/12/15 1924 10/13/15 0512 10/13/15 1516 10/14/15 0413 10/14/15 1943 10/15/15 0421  NA 136 131*  --  128*  --  136 138 138 137  K 3.3* 2.3*  < > 3.4*  --  3.1* 2.8* 4.1 3.6  CL 101 95*  --  93*  --  104 106 104 102  CO2 26 26  --  24  --  24 23 24 26   GLUCOSE 98 129*  --  163*  --  133* 117* 155* 128*  BUN 7 9  --  9  --  18 18 16 15   CREATININE 0.72 0.57  --  0.78  --  0.87 0.74 0.65 0.57  CALCIUM 9.8 9.0  --  9.0  --  8.8* 8.5* 8.8* 8.5*  MG 1.4* 1.5*  --   --  1.8  --  2.3  --  1.7  PHOS  --   --   --   --  3.0  --  1.9*  --  2.9  < > = values in this interval not  displayed.  Liver Function Tests:  Recent Labs Lab 10/08/15 2059 10/10/15 1405 10/12/15 0400 10/12/15 1924  AST 26 25 21 26   ALT 14 14 10* 12*  ALKPHOS 48  47 40 43  BILITOT 1.0 1.1 0.8 1.1  PROT 6.8 6.6 5.8* 6.2*  ALBUMIN 4.1 3.6 3.0* 3.2*    Recent Labs Lab 10/09/15 0228  LIPASE 35    Recent Labs Lab 10/08/15 2100  AMMONIA 16    CBC:  Recent Labs Lab 10/10/15 1405 10/12/15 0400 10/13/15 0512 10/14/15 0413 10/15/15 0421  WBC 7.3 6.1 6.4 7.2 6.1  HGB 12.0 11.1* 10.5* 11.6* 9.9*  HCT 35.6* 32.1* 30.7* 34.6* 29.8*  MCV 89.9 87.5 87.7 88.7 90.3  PLT 215 201 153 166 192    Cardiac Enzymes:  Recent Labs Lab 10/09/15 0035  CKTOTAL 79    Lipid Panel:  Recent Labs Lab 10/09/15 0229 10/12/15 1924  CHOL 181  --   TRIG 45 46  HDL 53  --   CHOLHDL 3.4  --   VLDL 9  --   LDLCALC 119*  --     CBG:  Recent Labs Lab 10/14/15 2349 10/15/15 0356 10/15/15 0750 10/15/15 1144 10/15/15 1554  GLUCAP 118* 114* 131* 136* 111*    Microbiology: Results for orders placed or performed during the hospital encounter of 10/08/15  Urine culture     Status: None   Collection Time: 10/09/15  1:43 AM  Result Value Ref Range Status   Specimen Description URINE, CATHETERIZED  Final   Special Requests NONE  Final   Culture   Final    >=100,000 COLONIES/mL DIPHTHEROIDS(CORYNEBACTERIUM SPECIES) Standardized susceptibility testing for this organism is not available.    Report Status 10/10/2015 FINAL  Final  Culture, blood (Routine X 2) w Reflex to ID Panel     Status: None   Collection Time: 10/09/15  5:30 AM  Result Value Ref Range Status   Specimen Description BLOOD LEFT HAND  Final   Special Requests IN PEDIATRIC BOTTLE 1CC  Final   Culture NO GROWTH 5 DAYS  Final   Report Status 10/14/2015 FINAL  Final  Culture, blood (Routine X 2) w Reflex to ID Panel     Status: None   Collection Time: 10/09/15  5:38 AM  Result Value Ref Range Status   Specimen  Description BLOOD LEFT ARM  Final   Special Requests IN PEDIATRIC BOTTLE [place]  Final   Culture NO GROWTH 5 DAYS  Final   Report Status 10/14/2015 FINAL  Final  MRSA PCR Screening     Status: None   Collection Time: 10/10/15 11:39 PM  Result Value Ref Range Status   MRSA by PCR NEGATIVE NEGATIVE Final    Comment:        The GeneXpert MRSA Assay (FDA approved for NASAL specimens only), is one component of a comprehensive MRSA colonization surveillance program. It is not intended to diagnose MRSA infection nor to guide or monitor treatment for MRSA infections.   CSF culture     Status: None   Collection Time: 10/11/15  1:33 PM  Result Value Ref Range Status   Specimen Description CSF  Final   Special Requests CSF NO 2  Final   Gram Stain   Final    CYTOSPIN SMEAR WBC PRESENT, PREDOMINANTLY PMN NO ORGANISMS SEEN    Culture NO GROWTH 3 DAYS  Final   Report Status 10/14/2015 FINAL  Final  Culture, respiratory (NON-Expectorated)     Status: None (Preliminary result)   Collection Time: 10/13/15  9:30 AM  Result Value Ref Range Status   Specimen Description ENDOTRACHEAL  Final   Special Requests NONE  Final  Gram Stain   Final    FEW WBC PRESENT,BOTH PMN AND MONONUCLEAR RARE SQUAMOUS EPITHELIAL CELLS PRESENT NO ORGANISMS SEEN Performed at [facility]    Culture   Final    Culture reincubated for better growth Performed at [facility]    Report Status PENDING  Incomplete    Imaging: Dg Chest Port 1 View  10/15/2015  CLINICAL DATA:  Respiratory failure, cerebral vascular disease, intubated patient. EXAM: PORTABLE CHEST 1 VIEW COMPARISON:  Portable chest x-ray dated October 14, 2015. FINDINGS: The lungs are hyperinflated. There is minimal increased density in the left lateral costophrenic angle today consistent with worsening atelectasis or trace of pleural fluid. There is minimal blunting of the right lateral costophrenic angle which is also more  conspicuous. The heart is normal in size. The pulmonary vascularity is not engorged. There is no pneumothorax or pneumomediastinum. The endotracheal tube tip lies 4 cm above the carina. The esophagogastric tube tip projects below the inferior margin of the image. IMPRESSION: Slight interval increased density at both lung bases may reflect very early subsegmental atelectasis. There is no alveolar pneumonia nor large pleural effusion. The support tubes are in reasonable position. Electronically Signed   By: David  Martinique M.D.   On: 10/15/2015 07:45   Dg Chest Port 1 View  10/14/2015  CLINICAL DATA:  Hypoxia EXAM: PORTABLE CHEST 1 VIEW COMPARISON:  October 13, 2015 FINDINGS: Endotracheal tube tip is 3.1 cm above the carina. Nasogastric tube tip and side port are below the diaphragm. No pneumothorax. Lungs are clear. The heart size and pulmonary vascularity are normal. No adenopathy. No bone lesions. IMPRESSION: Tube positions as described without pneumothorax. No edema or consolidation. Electronically Signed   By: Lowella Grip III M.D.   On: 10/14/2015 07:53   Dg Chest Port 1 View  10/13/2015  CLINICAL DATA:  Respiratory failure EXAM: PORTABLE CHEST 1 VIEW COMPARISON:  10/12/2015; 10/08/2015 FINDINGS: Grossly unchanged cardiac silhouette and mediastinal contours. Interval placement of enteric tube with tip and side port project below the left hemidiaphragm. Stable positioning of endotracheal tube. No pneumothorax. No focal airspace opacities. No pleural effusion or pneumothorax. No evidence of edema. No acute osseus abnormalities. IMPRESSION: 1. Interval placement of enteric tube with tip and side port projecting below the left hemidiaphragm. Otherwise, stable positioning remaining support apparatus. No pneumothorax. 2.  No acute cardiopulmonary disease. Electronically Signed   By: Sandi Mariscal M.D.   On: 10/13/2015 08:30   Dg Chest Port 1 View  10/12/2015  CLINICAL DATA:  Endotracheal tube placement.  EXAM: PORTABLE CHEST 1 VIEW COMPARISON:  Chest x-ray dated 10/11/2015 and chest x-ray dated 10/08/2015. FINDINGS: Endotracheal tube has been placed with tip well positioned approximately 2.5 cm above the level of the carina. Heart size remains normal. Lungs are clear. No pleural effusions seen. No pneumothorax seen. IMPRESSION: Endotracheal tube well positioned with tip approximately 2.5 cm above the level of the carina. Electronically Signed   By: Franki Cabot M.D.   On: 10/12/2015 19:43    Assessment and plan:   Kathryn [name] is an 82 y.o. female patient who is admitted in ICU with Status epilepticus. LTM continues to show left sided PLEDs. Recommend increasing keppra dose 1500 mg BID from current 1 gm BID, and increase vimpat dose to 150 mg BID from current 100 mg BID. Continue propofol gtt. Will start versed gtt as well, titrated to control the PLED's on EEG.  Continue LTM EEG.  Will f/u.

## 2015-10-15 NOTE — Progress Notes (Signed)
Nutrition Follow-up  INTERVENTION:   Increase Vital AF 1.2 to 50 ml/hr D/C Prostat  Provides: 1440 kcal (100% of needs), 90 grams protein, and 973 ml H2O.   NUTRITION DIAGNOSIS:   Inadequate oral intake related to inability to eat as evidenced by NPO status. Ongoing.   GOAL:   Patient will meet greater than or equal to 90% of their needs Progressing  MONITOR:   TF tolerance, Vent status, Labs, I & O's  ASSESSMENT:   [name] with PMH of HTN, hyperlipidemia, GERD, hypothyroidism, depression, anxiety, TIA, stroke, recurrent UTI, chronic back pain, chronic abdominal pain, who presented with altered mental status and hallucination.  Patient is currently intubated on ventilator support MV: 9.5 L/min Temp (24hrs), Avg:98.7 F (37.1 C), Min:98.2 F (36.8 C), Max:99.1 F (37.3 C)  Propofol: off - will adjust TF.   Pt discussed during ICU rounds and with RN.  Labs reviewed: CBG's: 114-136 Pt on continuous EEG.   Diet Order:  Diet NPO time specified  Skin:  Reviewed, no issues  Last BM:  2/13  Height:   Ht Readings from Last 1 Encounters:  10/12/15 5\' 3"  (1.6 m)   Weight:   Wt Readings from Last 1 Encounters:  10/15/15 129 lb 10.1 oz (58.8 kg)   Ideal Body Weight:  52 kg  BMI:  Body mass index is 22.97 kg/(m^2).  Estimated Nutritional Needs:   Kcal:  1424  Protein:  75-85 grams  Fluid:  >/= 1.5 L  EDUCATION NEEDS:   No education needs identified at this time  [place], [place], [place] Pager [phone] After Hours Pager

## 2015-10-15 NOTE — Progress Notes (Signed)
PULMONARY / CRITICAL CARE MEDICINE   Name: Kathryn [name] MRN: [id number] DOB: 1931-06-13    ADMISSION DATE:  10/08/2015 CONSULTATION DATE:  10/12/2015  REFERRING MD:  Dr. Janann Colonel, neurology  CHIEF COMPLAINT:  Seizures.  HISTORY OF PRESENT ILLNESS:   Hx from chart.  82 yo female brought to ER due to confusion after falling.  She was also having visual hallucinations.  She was noted to have seizure on 2/08 and neurology consulted.  She was also noted to have fever.  She had LP that was unrevealing.  She was started on abx for possible UTI, and acyclovir for possible encephalitis.  Her seizures persistent, and neuro recommend transfer to ICU for diprivan coma and intubation.   STUDIES:  2/06 CT head >> atrophy 2/07 MRI brain >> remote basal ganglia and corona radiata infarcts b/l, remote Rt thalamus and posterior limb internal capsule infarcts, global atrophy 2/08 EEG >> lt temporal region focal seizures, lt hemisphere slowing 2/09 LP >> 140 RBC, 2 WBC, protein 38, glucose 51   CULTURES: 2/07 Urine >> diphtheroids 2/07 Blood >>NGTD>> 2/09 CSF >>NGTD>>  ANTIBIOTICS: 2/06 Levaquin >>off 2/09 Acyclovir >> 2/11 2/10 Vanco>>off 2/10 ceftaz>> 2/11 sputum>>   SIGNIFICANT EVENTS: 2/06 Admit 2/08 Noted to have seizure, neuro consulted 2/10 Status epilepticus >> to ICU, diprivan coma  LINES/TUBES: 2/10 ETT >>   EVENTS 2./12 - Sedated on vent, szs overnight    SUBJECTIVE/OVERNIGHT/INTERVAL HX 2/13 - afebrile with ongoing c-eeg and diprivan  VITAL SIGNS: BP 164/75 mmHg  Pulse 86  Temp(Src) 98.3 F (36.8 C) (Axillary)  Resp 17  Ht 5\' 3"  (1.6 m)  Wt 58.8 kg (129 lb 10.1 oz)  BMI 22.97 kg/m2  SpO2 100%  HEMODYNAMICS:    VENTILATOR SETTINGS: Vent Mode:  [-] PSV;CPAP FiO2 (%):  [40 %] 40 % Set Rate:  [10 bmp] 10 bmp Vt Set:  [450 mL] 450 mL PEEP:  [5 cmH20] 5 cmH20 Pressure Support:  [5 cmH20] 5 cmH20 Plateau Pressure:  [12 cmH20-13 cmH20] 12 cmH20  INTAKE /  OUTPUT: I/O last 3 completed shifts: In: 4408.2 [I.V.:1533.2; NG/GT:1440; IV Piggyback:1435] Out: 2775 [Urine:2775]  PHYSICAL EXAMINATION: General:  Ill appearing Neuro:  Sedated , no szs, no focus or follow HEENT: No JVD Cardiovascular:  Regular, no murmur Lungs:  B/l rhonchi Abdomen:  Soft, nontender Musculoskeletal:  No edema Skin:  No rashes  LABS:  BMET  Recent Labs Lab 10/14/15 0413 10/14/15 1943 10/15/15 0421  NA 138 138 137  K 2.8* 4.1 3.6  CL 106 104 102  CO2 23 24 26   BUN 18 16 15   CREATININE 0.74 0.65 0.57  GLUCOSE 117* 155* 128*    Electrolytes  Recent Labs Lab 10/13/15 0512  10/14/15 0413 10/14/15 1943 10/15/15 0421  CALCIUM  --   < > 8.5* 8.8* 8.5*  MG 1.8  --  2.3  --  1.7  PHOS 3.0  --  1.9*  --  2.9  < > = values in this interval not displayed.  CBC  Recent Labs Lab 10/13/15 0512 10/14/15 0413 10/15/15 0421  WBC 6.4 7.2 6.1  HGB 10.5* 11.6* 9.9*  HCT 30.7* 34.6* 29.8*  PLT 153 166 192    Coag's  Recent Labs Lab 10/09/15 0228  APTT 29  INR 1.16    Sepsis Markers No results for input(s): LATICACIDVEN, PROCALCITON, O2SATVEN in the last 168 hours.  ABG  Recent Labs Lab 10/10/15 0925 10/12/15 2013 10/13/15 0019  PHART 7.466* 7.520* 7.494*  PCO2ART 31.7* 27.8* 32.0*  PO2ART 76.8* 543* 176.0*    Liver Enzymes  Recent Labs Lab 10/10/15 1405 10/12/15 0400 10/12/15 1924  AST 25 21 26   ALT 14 10* 12*  ALKPHOS 47 40 43  BILITOT 1.1 0.8 1.1  ALBUMIN 3.6 3.0* 3.2*    Cardiac Enzymes No results for input(s): TROPONINI, PROBNP in the last 168 hours.  Glucose  Recent Labs Lab 10/14/15 1143 10/14/15 1536 10/14/15 2015 10/14/15 2349 10/15/15 0356 10/15/15 0750  GLUCAP 123* 142* 136* 118* 114* 131*    Imaging Dg Chest Port 1 View  10/15/2015  CLINICAL DATA:  Respiratory failure, cerebral vascular disease, intubated patient. EXAM: PORTABLE CHEST 1 VIEW COMPARISON:  Portable chest x-ray dated October 14, 2015. FINDINGS: The lungs are hyperinflated. There is minimal increased density in the left lateral costophrenic angle today consistent with worsening atelectasis or trace of pleural fluid. There is minimal blunting of the right lateral costophrenic angle which is also more conspicuous. The heart is normal in size. The pulmonary vascularity is not engorged. There is no pneumothorax or pneumomediastinum. The endotracheal tube tip lies 4 cm above the carina. The esophagogastric tube tip projects below the inferior margin of the image. IMPRESSION: Slight interval increased density at both lung bases may reflect very early subsegmental atelectasis. There is no alveolar pneumonia nor large pleural effusion. The support tubes are in reasonable position. Electronically Signed   By: David  Martinique M.D.   On: 10/15/2015 07:45    DISCUSSION: 82 yo female with acute encephalopathy and status epilepticus.  She has hx of CVA's and polypharmacy.  She required intubation for diprivan coma to control seizures. 2/12 recurrent szs with diprivan dosed decreased. Dilantin added 2/12.  ASSESSMENT / PLAN:  PULMONARY A: Acute respiratory failure in setting of status epilepticus.   - 2/13 - does not meet sbt criteria due to c-EEG P:   Full vent support F/u CXR, ABG  CARDIOVASCULAR A:  Hx of HTN, HLD. P:  Continue zocor Monitor hemodynamics  RENAL  A:   Hypokalemia. Hypomag    - repleted by elink 10/15/15 P:   Replace electrolytes as needed  GASTROINTESTINAL A:   Nutrition. Chronic abdominal pain. P:   Tube feeds while on vent Protonix for SUP  HEMATOLOGIC A:   No acute issues. P:  F/u CBC Lovenox for DVT prevention  INFECTIOUS No results for input(s): PROCALCITON in the last 168 hours. Results for orders placed or performed during the hospital encounter of 10/08/15  Urine culture     Status: None   Collection Time: 10/09/15  1:43 AM  Result Value Ref Range Status   Specimen Description  URINE, CATHETERIZED  Final   Special Requests NONE  Final   Culture   Final    >=100,000 COLONIES/mL DIPHTHEROIDS(CORYNEBACTERIUM SPECIES) Standardized susceptibility testing for this organism is not available.    Report Status 10/10/2015 FINAL  Final  Culture, blood (Routine X 2) w Reflex to ID Panel     Status: None   Collection Time: 10/09/15  5:30 AM  Result Value Ref Range Status   Specimen Description BLOOD LEFT HAND  Final   Special Requests IN PEDIATRIC BOTTLE 1CC  Final   Culture NO GROWTH 5 DAYS  Final   Report Status 10/14/2015 FINAL  Final  Culture, blood (Routine X 2) w Reflex to ID Panel     Status: None   Collection Time: 10/09/15  5:38 AM  Result Value Ref Range Status   Specimen  Description BLOOD LEFT ARM  Final   Special Requests IN PEDIATRIC BOTTLE [place]  Final   Culture NO GROWTH 5 DAYS  Final   Report Status 10/14/2015 FINAL  Final  MRSA PCR Screening     Status: None   Collection Time: 10/10/15 11:39 PM  Result Value Ref Range Status   MRSA by PCR NEGATIVE NEGATIVE Final    Comment:        The GeneXpert MRSA Assay (FDA approved for NASAL specimens only), is one component of a comprehensive MRSA colonization surveillance program. It is not intended to diagnose MRSA infection nor to guide or monitor treatment for MRSA infections.   CSF culture     Status: None   Collection Time: 10/11/15  1:33 PM  Result Value Ref Range Status   Specimen Description CSF  Final   Special Requests CSF NO 2  Final   Gram Stain   Final    CYTOSPIN SMEAR WBC PRESENT, PREDOMINANTLY PMN NO ORGANISMS SEEN    Culture NO GROWTH 3 DAYS  Final   Report Status 10/14/2015 FINAL  Final  Culture, respiratory (NON-Expectorated)     Status: None (Preliminary result)   Collection Time: 10/13/15  9:30 AM  Result Value Ref Range Status   Specimen Description ENDOTRACHEAL  Final   Special Requests NONE  Final   Gram Stain   Final    FEW WBC PRESENT,BOTH PMN AND MONONUCLEAR RARE  SQUAMOUS EPITHELIAL CELLS PRESENT NO ORGANISMS SEEN Performed at [facility]    Culture   Final    Culture reincubated for better growth Performed at [facility]    Report Status PENDING  Incomplete     A:   ?UTI. ?Encephalitis. P:   Anti-infectives    Start     Dose/Rate Route Frequency Ordered Stop   10/12/15 1945  vancomycin (VANCOCIN) 500 mg in sodium chloride 0.9 % 100 mL IVPB  Status:  Discontinued     500 mg 100 mL/hr over 60 Minutes Intravenous Every 12 hours 10/12/15 1936 10/13/15 1509   10/12/15 1945  cefTAZidime (FORTAZ) 1 g in dextrose 5 % 50 mL IVPB  Status:  Discontinued     1 g 100 mL/hr over 30 Minutes Intravenous Every 12 hours 10/12/15 1936 10/14/15 1159   10/11/15 1400  acyclovir (ZOVIRAX) 500 mg in dextrose 5 % 100 mL IVPB  Status:  Discontinued     500 mg 110 mL/hr over 60 Minutes Intravenous Every 12 hours 10/11/15 1243 10/13/15 0934   10/09/15 0400  levofloxacin (LEVAQUIN) IVPB 750 mg  Status:  Discontinued     750 mg 100 mL/hr over 90 Minutes Intravenous Every 48 hours 10/09/15 0358 10/11/15 1403       ENDOCRINE A:   No acute issues. P:   Monitor CBGs  NEUROLOGIC A:   Acute encephalopathy 2nd to status epilepticus. Hx of anxiety, depression on chronic benzo's. Hx of CVA. Breakthrough szs when diprivan decreased   [place] c-EEG P:   AEDs per neurology On  diprivan,  Dilantin  GLOBAL 2/13 - no family at bedside   The patient is critically ill with multiple organ systems failure and requires high complexity decision making for assessment and support, frequent evaluation and titration of therapies, application of advanced monitoring technologies and extensive interpretation of multiple databases.   Critical Care Time devoted to patient care services described in this note is  30  Minutes. This time reflects time of care of this signee Dr Belva Crome  Kathryn [name]. This critical care time does not reflect procedure time, or  teaching time or supervisory time of PA/NP/Med student/Med Resident etc but could involve care discussion time    Dr. Brand Males, M.D., [facility].C.P Pulmonary and Critical Care Medicine Staff Physician [facility] Pager: [phone], If no answer or between  15:00h - 7:00h: call [phone]  10/15/2015 10:10 AM

## 2015-10-15 NOTE — Procedures (Signed)
Continuous Video-EEG Monitoring Report  Patient: Kathryn [name], Kathryn [name]     EEG No.ID: [id number]     DOB: 1931-02-28 Age: 82 Room#2C11  MED REC NO: [id number] Gender: Female   TECH: Wyman Songster     Physician: Winfield Cunas     Referring Physician: Jim Like   Report Date: 10/15/2015    Study Duration:10/14/2015 07:30 to 10/15/2015 07:30 CPT [id number] Diagnosis:Altered mental status (R41.82); Seizures (R56.9)  History: This is an 82 year old female presenting with altered mental status with visual hallucinaiton. Video-EEG monitoring was performed to evaluate for seizures.  Technical Details: Long-term video-EEG monitoring was performed using standard setting per the guidelines. Briefly, a minimum of 21 electrodes were placed on scalp according to the International 10-20 or/and 10-10 Systems. Supplemental electrodes were placed as needed. Single EKG electrode was also used to detect cardiac arrhythmia. Patient's behavior was continuously recorded on video simultaneously with EEG. A minimum of 16 channels were used for data display. Each epoch of study was reviewed manually daily and as needed using standard referential and bipolar montages.   EEG Description: There was generalized polymorphic delta and theta slowing. Diffuse beta activity was also present, most likely due to medication effect (e.g., propofol, benzodiazepine). No posterior dominant rhythm or sleep architecture was recorded. Persistent periodic lateralized epileptiform discharges (PLEDs) were present in the left hemisphere (~0.5-1 Hz), which were less prominent in the latter part of the record. No seizures were present.  Impression: This is an abnormal EEG due to the presence of the following: 1) Generalized polymorphic delta and theta slowing, suggesting moderate to severe  encephalopathy, but medication effect could not be excluded; 2) PLEDs in the left hemisphere. No seizures were present.  Compared with the last study, current study showed no significant changes.     [place] Physician: Winfield Cunas, MD, PhD

## 2015-10-15 NOTE — Clinical Social Work Note (Signed)
Clinical Social Worker continuing to follow patient and family for support and discharge planning needs.  Patient remains on the ventilator at this time.  CSW remains available for support and to facilitate patient discharge needs once medically stable.  Barbette Or, [place]

## 2015-10-15 NOTE — Care Management Note (Signed)
Case Management Note  Patient Details  Name: Kathryn [name] MRN: [id number] Date of Birth: 16-Jul-1931  Subjective/Objective:  Pt admitted on 10/08/15 with acute encephalopathy, UTI, and seizure activity.  PTA, pt resided at home alone.                    Action/Plan: Will follow for discharge planning as pt progresses.  PT recommending SNF.  May be good LTAC candidate.    Expected Discharge Date:                  Expected Discharge Plan:  Skilled Nursing Facility  In-House Referral:  Clinical Social Work  Discharge planning Services  CM Consult  Post Acute Care Choice:    Choice offered to:     DME Arranged:    DME Agency:     HH Arranged:    [place] Agency:     Status of Service:  In process, will continue to follow  Medicare Important Message Given:  Yes Date Medicare IM Given:    Medicare IM give by:    Date Additional Medicare IM Given:    Additional Medicare Important Message give by:     If discussed at [place] of Stay Meetings, dates discussed:    Additional Comments:  Reinaldo Raddle, RN, BSN  Trauma/Neuro ICU Case Manager [phone]

## 2015-10-15 NOTE — Progress Notes (Signed)
LTM checked. Looked in on patient. Checked for skin breakdown. Patient has some redness, but ok.

## 2015-10-16 ENCOUNTER — Inpatient Hospital Stay (HOSPITAL_COMMUNITY): Payer: Medicare Other

## 2015-10-16 LAB — CBC WITH DIFFERENTIAL/PLATELET
BASOS PCT: 0 %
Basophils Absolute: 0 10*3/uL (ref 0.0–0.1)
EOS ABS: 0.2 10*3/uL (ref 0.0–0.7)
EOS PCT: 3 %
HCT: 32.5 % — ABNORMAL LOW (ref 36.0–46.0)
HEMOGLOBIN: 10.5 g/dL — AB (ref 12.0–15.0)
Lymphocytes Relative: 12 %
Lymphs Abs: 0.8 10*3/uL (ref 0.7–4.0)
MCH: 29.8 pg (ref 26.0–34.0)
MCHC: 32.3 g/dL (ref 30.0–36.0)
MCV: 92.3 fL (ref 78.0–100.0)
Monocytes Absolute: 1 10*3/uL (ref 0.1–1.0)
Monocytes Relative: 13 %
NEUTROS PCT: 72 %
Neutro Abs: 5.1 10*3/uL (ref 1.7–7.7)
PLATELETS: 231 10*3/uL (ref 150–400)
RBC: 3.52 MIL/uL — AB (ref 3.87–5.11)
RDW: 13.3 % (ref 11.5–15.5)
WBC: 7.1 10*3/uL (ref 4.0–10.5)

## 2015-10-16 LAB — PHOSPHORUS: PHOSPHORUS: 2.7 mg/dL (ref 2.5–4.6)

## 2015-10-16 LAB — BASIC METABOLIC PANEL
ANION GAP: 12 (ref 5–15)
BUN: 19 mg/dL (ref 6–20)
CALCIUM: 9.3 mg/dL (ref 8.9–10.3)
CO2: 28 mmol/L (ref 22–32)
CREATININE: 0.57 mg/dL (ref 0.44–1.00)
Chloride: 97 mmol/L — ABNORMAL LOW (ref 101–111)
Glucose, Bld: 122 mg/dL — ABNORMAL HIGH (ref 65–99)
Potassium: 3.2 mmol/L — ABNORMAL LOW (ref 3.5–5.1)
SODIUM: 137 mmol/L (ref 135–145)

## 2015-10-16 LAB — CULTURE, RESPIRATORY W GRAM STAIN

## 2015-10-16 LAB — GLUCOSE, CAPILLARY
GLUCOSE-CAPILLARY: 105 mg/dL — AB (ref 65–99)
GLUCOSE-CAPILLARY: 116 mg/dL — AB (ref 65–99)
GLUCOSE-CAPILLARY: 119 mg/dL — AB (ref 65–99)
GLUCOSE-CAPILLARY: 99 mg/dL (ref 65–99)
Glucose-Capillary: 142 mg/dL — ABNORMAL HIGH (ref 65–99)
Glucose-Capillary: 117 mg/dL — ABNORMAL HIGH (ref 65–99)
Glucose-Capillary: 100 mg/dL — ABNORMAL HIGH (ref 65–99)

## 2015-10-16 LAB — MAGNESIUM: MAGNESIUM: 1.8 mg/dL (ref 1.7–2.4)

## 2015-10-16 LAB — FANA STAINING PATTERNS: Homogeneous Pattern: 1:320 {titer} — ABNORMAL HIGH

## 2015-10-16 LAB — PHENYTOIN LEVEL, TOTAL: PHENYTOIN LVL: 24.5 ug/mL — AB (ref 10.0–20.0)

## 2015-10-16 LAB — ANTINUCLEAR ANTIBODIES, IFA: ANA Ab, IFA: POSITIVE — AB

## 2015-10-16 LAB — TRIGLYCERIDES: Triglycerides: 69 mg/dL (ref <150)

## 2015-10-16 MED ORDER — PROPOFOL 1000 MG/100ML IV EMUL
5.0000 ug/kg/min | INTRAVENOUS | Status: DC
  Administered 2015-10-16: 10 ug/kg/min via INTRAVENOUS
  Administered 2015-10-17: 12 ug/kg/min via INTRAVENOUS
  Administered 2015-10-18: 8 ug/kg/min via INTRAVENOUS
  Filled 2015-10-16 (×3): qty 100

## 2015-10-16 MED ORDER — MAGNESIUM SULFATE 2 GM/50ML IV SOLN
2.0000 g | Freq: Once | INTRAVENOUS | Status: AC
  Administered 2015-10-16: 2 g via INTRAVENOUS
  Filled 2015-10-16: qty 50

## 2015-10-16 MED ORDER — GADOBENATE DIMEGLUMINE 529 MG/ML IV SOLN
15.0000 mL | Freq: Once | INTRAVENOUS | Status: AC | PRN
  Administered 2015-10-16: 15 mL via INTRAVENOUS

## 2015-10-16 MED ORDER — SODIUM CHLORIDE 0.9 % IV SOLN
125.0000 mg | Freq: Three times a day (TID) | INTRAVENOUS | Status: DC
  Administered 2015-10-16 – 2015-10-17 (×2): 125 mg via INTRAVENOUS
  Filled 2015-10-16 (×6): qty 2.5

## 2015-10-16 MED ORDER — SODIUM CHLORIDE 0.9% FLUSH
10.0000 mL | Freq: Two times a day (BID) | INTRAVENOUS | Status: DC
  Administered 2015-10-16 – 2015-10-22 (×11): 10 mL
  Administered 2015-10-22: 20 mL
  Administered 2015-10-23 – 2015-10-26 (×6): 10 mL
  Administered 2015-10-27 – 2015-10-31 (×3): 20 mL
  Administered 2015-11-01: 10 mL
  Administered 2015-11-01: 20 mL

## 2015-10-16 MED ORDER — SODIUM CHLORIDE 0.9 % IV SOLN
5.0000 mg/h | INTRAVENOUS | Status: DC
  Administered 2015-10-16: 2 mg/h via INTRAVENOUS
  Administered 2015-10-16 – 2015-10-17 (×4): 5 mg/h via INTRAVENOUS
  Filled 2015-10-16 (×8): qty 10

## 2015-10-16 MED ORDER — PREGABALIN 25 MG PO CAPS
25.0000 mg | ORAL_CAPSULE | Freq: Three times a day (TID) | ORAL | Status: DC
  Administered 2015-10-16 – 2015-10-17 (×3): 25 mg via ORAL
  Filled 2015-10-16 (×3): qty 1

## 2015-10-16 MED ORDER — SODIUM CHLORIDE 0.9 % IV SOLN
200.0000 mg | Freq: Two times a day (BID) | INTRAVENOUS | Status: DC
  Administered 2015-10-16 – 2015-10-19 (×7): 200 mg via INTRAVENOUS
  Filled 2015-10-16 (×13): qty 20

## 2015-10-16 MED ORDER — SODIUM CHLORIDE 0.9 % IV SOLN
2000.0000 mg | Freq: Two times a day (BID) | INTRAVENOUS | Status: DC
  Administered 2015-10-16 – 2015-10-19 (×7): 2000 mg via INTRAVENOUS
  Filled 2015-10-16 (×8): qty 20

## 2015-10-16 MED ORDER — SODIUM CHLORIDE 0.9% FLUSH
10.0000 mL | INTRAVENOUS | Status: DC | PRN
  Administered 2015-10-26: 10 mL
  Administered 2015-10-27: 40 mL
  Administered 2015-10-28 – 2015-10-30 (×3): 10 mL
  Administered 2015-10-30: 30 mL
  Administered 2015-10-30 – 2015-10-31 (×5): 10 mL
  Administered 2015-11-01: 20 mL
  Administered 2015-11-02: 10 mL
  Administered 2015-11-02: 20 mL
  Filled 2015-10-16 (×14): qty 40

## 2015-10-16 NOTE — Progress Notes (Signed)
LTM up and running  

## 2015-10-16 NOTE — Progress Notes (Signed)
Interval History:                                                                                                                      Kathryn [name] is an 82 y.o. female patient admitted in ICU with status epilepticus.  LTM EEG in the past 24 hrs demonstrated a continuous left hemispheric periodic lateralized epileptiform discharges as discussed above. Left hemispheric subclinical electrographic seizures present throughout the recording particularly during second half of the recording. Overall, no significant change despite increasing seizure medication doses yesterday, and starting versed gtt and propofol gtt.      Past Medical History: Past Medical History  Diagnosis Date  . Depression   . Hypertension   . High cholesterol   . Rectal prolapse   . Hx of adenomatous colonic polyps   . Internal hemorrhoids   . Hypothyroidism   . Fecal incontinence   . TIA (transient ischemic attack)   . History of frequent urinary tract infections     recent  . Migraine   . Fatty liver 03/20/13  . Generalized ischemic cerebrovascular disease 10/09/2015  . Stroke [facility]) Sept 1, 2010; Sept 12, 2011    Past Surgical History  Procedure Laterality Date  . Colon surgery  2010, for prolapsed organs after hystecrtomy surgery  . Appendectomy  2008  . Cholecystectomy    . Lumbar laminectomy/decompression microdiscectomy N/A 01/07/2013    Procedure: LUMBAR LAMINECTOMY CENTRAL DECOMPRESSION L4-L5, BILATERAL FORAMENOTOMY L4,L5    ;  Surgeon: Tobi Bastos, MD;  Location: [facility];  Service: Orthopedics;  Laterality: N/A;  . Back surgery    . Hemorrhoid surgery  09/2008    Archie Endo 12/31/2010  . Abdominal hysterectomy  05/2007    Archie Endo 01/02/2011    Family History: Family History  Problem Relation Age of Onset  . Stroke Father   . Migraines Father   . CVA Father   . Heart attack Father   . Hypertension Maternal Aunt     x3  . Colon cancer Neg Hx   . CVA Mother     Social History:   reports that she has  never smoked. She has never used smokeless tobacco. She reports that she does not drink alcohol or use illicit drugs.  Allergies:  Allergies  Allergen Reactions  . Augmentin [Amoxicillin-Pot Clavulanate] Swelling and Diarrhea    Throat   . Citalopram Swelling    Eyes ears and throat swelling Throat and eyes   . Codeine Anaphylaxis and Shortness Of Breath  . Fish-Derived Products Anaphylaxis  . Hyoscyamine Anaphylaxis and Swelling  . Ibuprofen Swelling    Throat and eyes   . Latex Swelling and Rash    Swelling to troat  . Lipitor [Atorvastatin] Swelling    Muscle aches throat  . Septra [Bactrim] Anaphylaxis  . Shellfish Allergy Anaphylaxis  . Miconazole Rash  . Keflet [Cephalexin] Diarrhea    Upset stomach  . Meloxicam Diarrhea and Nausea And Vomiting  . Sulfa Antibiotics  Rash and also throat was tight  . Verapamil     Tachycardia and flushing  . Vicodin [Hydrocodone-Acetaminophen] Other (See Comments)    stroke  . Zoloft [Sertraline Hcl] Swelling    Red spots all over face, swelling of tongue and legs.   . Ciprofloxacin Nausea And Vomiting    Other reaction(s): Abdominal Pain  . Depakene [Valproate Sodium] Hives    All over body   . Isoptin Sr [Verapamil Hcl Er] Cough  . Lisinopril Cough    Fatigue and cough  . Telmisartan-Hctz Cough     Medications:                                                                                                                         Current facility-administered medications:  .  0.9 %  sodium chloride infusion, , Intravenous, Continuous, Chesley Mires, MD, Stopped at 10/16/15 0900 .  Place/Maintain arterial line, , , Until Discontinued **AND** 0.9 %  sodium chloride infusion, , Intra-arterial, PRN, Javier Glazier, MD, Stopped at 10/14/15 1151 .  acetaminophen (TYLENOL) solution 650 mg, 650 mg, Oral, Q6H PRN, Chesley Mires, MD .  antiseptic oral rinse solution (CORINZ), 7 mL, Mouth Rinse, 10 times per day, Chesley Mires, MD, 7  mL at 10/16/15 1116 .  bisacodyl (DULCOLAX) suppository 10 mg, 10 mg, Rectal, Daily PRN, Chesley Mires, MD .  chlorhexidine gluconate (PERIDEX) 0.12 % solution 15 mL, 15 mL, Mouth Rinse, BID, Chesley Mires, MD, 15 mL at 10/16/15 0747 .  enoxaparin (LOVENOX) injection 40 mg, 40 mg, Subcutaneous, Q24H, Drema Dallas, DO, 40 mg at 10/16/15 0950 .  feeding supplement (VITAL AF 1.2 CAL) liquid 1,000 mL, 1,000 mL, Per Tube, Continuous, Brand Males, MD, Stopped at 10/16/15 1200 .  fentaNYL (SUBLIMAZE) injection 50 mcg, 50 mcg, Intravenous, Q2H PRN, Chesley Mires, MD, 50 mcg at 10/16/15 [id number] .  hydrALAZINE (APRESOLINE) injection 5 mg, 5 mg, Intravenous, Q2H PRN, Ivor Costa, MD, 5 mg at 10/15/15 1422 .  insulin aspart (novoLOG) injection 0-15 Units, 0-15 Units, Subcutaneous, 6 times per day, Chesley Mires, MD, 2 Units at 10/16/15 0427 .  lacosamide (VIMPAT) 200 mg in sodium chloride 0.9 % 25 mL IVPB, 200 mg, Intravenous, Q12H, Sayaka Hoeppner Fuller Mandril, MD, 200 mg at 10/16/15 0926 .  levETIRAcetam (KEPPRA) 2,000 mg in sodium chloride 0.9 % 100 mL IVPB, 2,000 mg, Intravenous, Q12H, Reakwon Barren Fuller Mandril, MD, 2,000 mg at 10/16/15 0955 .  LORazepam (ATIVAN) injection 0.5 mg, 0.5 mg, Intravenous, Q4H PRN, Ivor Costa, MD, 0.5 mg at 10/10/15 0423 .  midazolam (VERSED) 50 mg in sodium chloride 0.9 % 50 mL (1 mg/mL) infusion, 5 mg/hr, Intravenous, Continuous, Layal Javid Fuller Mandril, MD, Last Rate: 2 mL/hr at 10/16/15 1200, 2 mg/hr at 10/16/15 1200 .  midazolam (VERSED) injection 1 mg, 1 mg, Intravenous, Q15 min PRN, Chesley Mires, MD .  midazolam (VERSED) injection 1 mg, 1 mg, Intravenous, Q2H PRN, Chesley Mires, MD .  nystatin-triamcinolone [facility]  II) cream 1 application, 1 application, Topical, BID, Lauren D Bajbus, RPH, 1 application at [id number] .  [DISCONTINUED] ondansetron (ZOFRAN) tablet 4 mg, 4 mg, Oral, Q6H PRN **OR** ondansetron (ZOFRAN) injection 4 mg, 4 mg, Intravenous, Q6H PRN, Ivor Costa, MD .   pantoprazole sodium (PROTONIX) 40 mg/20 mL oral suspension 40 mg, 40 mg, Per Tube, Q24H, Chesley Mires, MD, 40 mg at 10/15/15 2034 .  phenytoin (DILANTIN) 125 mg in sodium chloride 0.9 % 100 mL IVPB, 125 mg, Intravenous, Q8H, Nasira Janusz Fuller Mandril, MD .  pregabalin (LYRICA) capsule 25 mg, 25 mg, Oral, TID, Berwyn Bigley Fuller Mandril, MD .  propofol (DIPRIVAN) 1000 MG/100ML infusion, 5-80 mcg/kg/min, Intravenous, Titrated, Staisha Winiarski Fuller Mandril, MD .  sennosides (SENOKOT) 8.8 MG/5ML syrup 5 mL, 5 mL, Per Tube, BID PRN, Chesley Mires, MD, 5 mL at 10/15/15 0923 .  simvastatin (ZOCOR) tablet 40 mg, 40 mg, Per Tube, q morning - 10a, Chesley Mires, MD, 40 mg at 10/16/15 0947 .  sodium chloride flush (NS) 0.9 % injection 3 mL, 3 mL, Intravenous, Q12H, Ivor Costa, MD, 3 mL at 10/15/15 2300 .  vitamin B-12 (CYANOCOBALAMIN) tablet 1,000 mcg, 1,000 mcg, Per Tube, Daily, Chesley Mires, MD, 1,000 mcg at 10/16/15 0947 .  vitamin C (ASCORBIC ACID) tablet 500 mg, 500 mg, Per Tube, Daily, Chesley Mires, MD, 500 mg at 10/16/15 0946 .  vitamin e oil OIL 200 Units, 200 Units, Per Tube, Daily, Chesley Mires, MD, 200 Units at 10/16/15 0947   Neurologic Examination:                                                                                                      Blood pressure 161/87, pulse 89, temperature 98.9 F (37.2 C), temperature source Axillary, resp. rate 17, height 5\' 3"  (1.6 m), weight 59 kg (130 lb 1.1 oz), SpO2 100 %.  Intubated, sedated. Preserved pupil, corneal and gag reflexes.  No motor response due to sedation.   Lab Results: Basic Metabolic Panel:  Recent Labs Lab 10/12/15 0400  10/13/15 0512 10/13/15 1516 10/14/15 0413 10/14/15 1943 10/15/15 0421 10/16/15 0831  NA 131*  < >  --  136 138 138 137 137  K 2.3*  < >  --  3.1* 2.8* 4.1 3.6 3.2*  CL 95*  < >  --  104 106 104 102 97*  CO2 26  < >  --  24 23 24 26 28   GLUCOSE 129*  < >  --  133* 117* 155* 128* 122*  BUN 9  < >  --  18 18  16 15 19   CREATININE 0.57  < >  --  0.87 0.74 0.65 0.57 0.57  CALCIUM 9.0  < >  --  8.8* 8.5* 8.8* 8.5* 9.3  MG 1.5*  --  1.8  --  2.3  --  1.7 1.8  PHOS  --   --  3.0  --  1.9*  --  2.9 2.7  < > = values in this interval not displayed.  Liver Function Tests:  Recent Labs  Lab 10/10/15 1405 10/12/15 0400 10/12/15 1924  AST 25 21 26   ALT 14 10* 12*  ALKPHOS 47 40 43  BILITOT 1.1 0.8 1.1  PROT 6.6 5.8* 6.2*  ALBUMIN 3.6 3.0* 3.2*   No results for input(s): LIPASE, AMYLASE in the last 168 hours. No results for input(s): AMMONIA in the last 168 hours.  CBC:  Recent Labs Lab 10/12/15 0400 10/13/15 0512 10/14/15 0413 10/15/15 0421 10/16/15 0831  WBC 6.1 6.4 7.2 6.1 7.1  NEUTROABS  --   --   --   --  5.1  HGB 11.1* 10.5* 11.6* 9.9* 10.5*  HCT 32.1* 30.7* 34.6* 29.8* 32.5*  MCV 87.5 87.7 88.7 90.3 92.3  PLT 201 153 166 192 231    Cardiac Enzymes: No results for input(s): CKTOTAL, CKMB, CKMBINDEX, TROPONINI in the last 168 hours.  Lipid Panel:  Recent Labs Lab 10/12/15 1924 10/15/15 1823  TRIG 46 59    CBG:  Recent Labs Lab 10/15/15 1554 10/15/15 2013 10/16/15 0039 10/16/15 0427 10/16/15 0748  GLUCAP 111* 121* 119* 142* 116*    Microbiology: Results for orders placed or performed during the hospital encounter of 10/08/15  Urine culture     Status: None   Collection Time: 10/09/15  1:43 AM  Result Value Ref Range Status   Specimen Description URINE, CATHETERIZED  Final   Special Requests NONE  Final   Culture   Final    >=100,000 COLONIES/mL DIPHTHEROIDS(CORYNEBACTERIUM SPECIES) Standardized susceptibility testing for this organism is not available.    Report Status 10/10/2015 FINAL  Final  Culture, blood (Routine X 2) w Reflex to ID Panel     Status: None   Collection Time: 10/09/15  5:30 AM  Result Value Ref Range Status   Specimen Description BLOOD LEFT HAND  Final   Special Requests IN PEDIATRIC BOTTLE 1CC  Final   Culture NO GROWTH 5 DAYS   Final   Report Status 10/14/2015 FINAL  Final  Culture, blood (Routine X 2) w Reflex to ID Panel     Status: None   Collection Time: 10/09/15  5:38 AM  Result Value Ref Range Status   Specimen Description BLOOD LEFT ARM  Final   Special Requests IN PEDIATRIC BOTTLE [place]  Final   Culture NO GROWTH 5 DAYS  Final   Report Status 10/14/2015 FINAL  Final  MRSA PCR Screening     Status: None   Collection Time: 10/10/15 11:39 PM  Result Value Ref Range Status   MRSA by PCR NEGATIVE NEGATIVE Final    Comment:        The GeneXpert MRSA Assay (FDA approved for NASAL specimens only), is one component of a comprehensive MRSA colonization surveillance program. It is not intended to diagnose MRSA infection nor to guide or monitor treatment for MRSA infections.   CSF culture     Status: None   Collection Time: 10/11/15  1:33 PM  Result Value Ref Range Status   Specimen Description CSF  Final   Special Requests CSF NO 2  Final   Gram Stain   Final    CYTOSPIN SMEAR WBC PRESENT, PREDOMINANTLY PMN NO ORGANISMS SEEN    Culture NO GROWTH 3 DAYS  Final   Report Status 10/14/2015 FINAL  Final  Culture, respiratory (NON-Expectorated)     Status: None   Collection Time: 10/13/15  9:30 AM  Result Value Ref Range Status   Specimen Description ENDOTRACHEAL  Final   Special Requests NONE  Final  Gram Stain   Final    FEW WBC PRESENT,BOTH PMN AND MONONUCLEAR RARE SQUAMOUS EPITHELIAL CELLS PRESENT NO ORGANISMS SEEN Performed at [facility]    Culture   Final    FEW YEAST CONSISTENT WITH CANDIDA SPECIES Performed at [facility]    Report Status 10/16/2015 FINAL  Final    Imaging: Dg Chest Port 1 View  10/16/2015  CLINICAL DATA:  Intubated patient, acute encephalopathy, acute respiratory failure. EXAM: PORTABLE CHEST 1 VIEW COMPARISON:  Portable chest x-ray of October 15, 2015. FINDINGS: The lungs remain well-expanded. Minimal density at the lung bases has improved. The  endotracheal tube tip lies 3.2 cm above the carina. The esophagogastric tube tip projects below the inferior margin of the image. The heart and pulmonary vascularity are normal. The mediastinum is normal in width. There is no pleural effusion or pneumothorax. The observed bony thorax is unremarkable. IMPRESSION: Minimal persistent basilar subsegmental atelectasis, nearly resolved. No evidence of pneumonia nor CHF. The support tubes are in reasonable position. Electronically Signed   By: David  Martinique M.D.   On: 10/16/2015 07:57   Dg Chest Port 1 View  10/15/2015  CLINICAL DATA:  Respiratory failure, cerebral vascular disease, intubated patient. EXAM: PORTABLE CHEST 1 VIEW COMPARISON:  Portable chest x-ray dated October 14, 2015. FINDINGS: The lungs are hyperinflated. There is minimal increased density in the left lateral costophrenic angle today consistent with worsening atelectasis or trace of pleural fluid. There is minimal blunting of the right lateral costophrenic angle which is also more conspicuous. The heart is normal in size. The pulmonary vascularity is not engorged. There is no pneumothorax or pneumomediastinum. The endotracheal tube tip lies 4 cm above the carina. The esophagogastric tube tip projects below the inferior margin of the image. IMPRESSION: Slight interval increased density at both lung bases may reflect very early subsegmental atelectasis. There is no alveolar pneumonia nor large pleural effusion. The support tubes are in reasonable position. Electronically Signed   By: David  Martinique M.D.   On: 10/15/2015 07:45   Dg Chest Port 1 View  10/14/2015  CLINICAL DATA:  Hypoxia EXAM: PORTABLE CHEST 1 VIEW COMPARISON:  October 13, 2015 FINDINGS: Endotracheal tube tip is 3.1 cm above the carina. Nasogastric tube tip and side port are below the diaphragm. No pneumothorax. Lungs are clear. The heart size and pulmonary vascularity are normal. No adenopathy. No bone lesions. IMPRESSION: Tube  positions as described without pneumothorax. No edema or consolidation. Electronically Signed   By: Lowella Grip III M.D.   On: 10/14/2015 07:53   Dg Chest Port 1 View  10/13/2015  CLINICAL DATA:  Respiratory failure EXAM: PORTABLE CHEST 1 VIEW COMPARISON:  10/12/2015; 10/08/2015 FINDINGS: Grossly unchanged cardiac silhouette and mediastinal contours. Interval placement of enteric tube with tip and side port project below the left hemidiaphragm. Stable positioning of endotracheal tube. No pneumothorax. No focal airspace opacities. No pleural effusion or pneumothorax. No evidence of edema. No acute osseus abnormalities. IMPRESSION: 1. Interval placement of enteric tube with tip and side port projecting below the left hemidiaphragm. Otherwise, stable positioning remaining support apparatus. No pneumothorax. 2.  No acute cardiopulmonary disease. Electronically Signed   By: Sandi Mariscal M.D.   On: 10/13/2015 08:30   Dg Chest Port 1 View  10/12/2015  CLINICAL DATA:  Endotracheal tube placement. EXAM: PORTABLE CHEST 1 VIEW COMPARISON:  Chest x-ray dated 10/11/2015 and chest x-ray dated 10/08/2015. FINDINGS: Endotracheal tube has been placed with tip well positioned approximately 2.5 cm  above the level of the carina. Heart size remains normal. Lungs are clear. No pleural effusions seen. No pneumothorax seen. IMPRESSION: Endotracheal tube well positioned with tip approximately 2.5 cm above the level of the carina. Electronically Signed   By: Franki Cabot M.D.   On: 10/12/2015 19:43    Assessment and plan:   JOZALYNN CRIHFIELD is an 82 y.o. female patient admitted in ICU with intractable status epilepticus on LTM EEG despite use of high doses of keppra, vimpat, versed gtt and propofol gtt.  To evaluate for intracranial pathology and for prognostication, recommend to obtain brain MRI study today. Will d/c LTM EEG for mri to be done.   MRI showed abnormal areas of restricted diffusion in left peri insular cortex,  and involving left thalamus, which I believe are secondary to intractable status epilepticus arising from these locations, as seen on EEG.   Recommend further increasing keppra dose to 2 gm BID, vimpat to 200 mg BID, dilantin 125 mg Q8H, added lyrica 25 mg TID, continue propofol gtt and versed gtt.   Discussed EEG and MRI findings and neurological management with her son at bedside.

## 2015-10-16 NOTE — Care Management Important Message (Signed)
Important Message  Patient Details  Name: Kathryn [name] MRN: [id number] Date of Birth: 03/28/1931   Medicare Important Message Given:  Yes    [name] May 10/16/2015, 4:52 PM

## 2015-10-16 NOTE — Progress Notes (Signed)
LTM discontinued this AM for MRI. Skin tender, but no breakdown noted.

## 2015-10-16 NOTE — Procedures (Signed)
Electroencephalogram report- LTM   Ordering Physician :  DR Collier Salina Summer  EEG number: [phone]  Data acquisition: 10-20 electrode placement.  Additional T1, T2, and EKG electrodes; 26 channel digital referential acquisition reformatted to 18 channel/7 channel coronal bipolar     Spike detection: ON  Seizure detection: ON   Beginning time: 10/15/15 Ending time:10/16/15  Day of study: day 5   This 24 hours of intensive EEG monitoring with simultaneous video monitoring was performed for this patient  left hemispheric PLEDs and left hemispheric seizures as a part of ongoing series to ensure resolution of subclinical electrographic seizures.  Medications: Patient is intubated and sedated with propofol.  Medications include Keppra and vimpat phenytoin  There was no pushbutton activations events during this recording.  Throughout the recording background activities marked by predominantly 1.5-2 cps low amplitude delta slowing with admixed faster frequencies in the theta and alpha range across right hemisphere more prominently.  When patient was awake surrounded background activities attenuating becomes faster across both hemispheres.  Superimposed left hemispheric periodic lateralized epileptiform discharges were present throughout the recording with frequency 1-2 cps.  Maximum negativity left central parietal cortex.   At times left hemispheric periodic lateralized epileptiform discharges become rhythmic,  synchronized evolving in morphology, frequency and amplitude and consistent with a subclinical electrographic seizures arising from the left hemisphere.  There is no clinical correlate.     Clinical interpretation: This 24 hours of intensive EEG monitoring with simultaneous monitoring again demonstrated a continuous left hemispheric periodic lateralized epileptiform discharges as discussed above.  Left hemispheric subclinical electrographic seizures present throughout the recording particularly  during second  half of the recording.  Continuous monitoring is recommended to ensure resolution of subclinical electrographic seizures.  These findings were discussed with ordering team at the time of interpretation.

## 2015-10-16 NOTE — Progress Notes (Signed)
PULMONARY / CRITICAL CARE MEDICINE   Name: Kathryn [name] MRN: [id number] DOB: [birth year]    ADMISSION DATE:  10/08/2015 CONSULTATION DATE:  10/12/2015  REFERRING MD:  Dr. Janann Colonel, neurology  CHIEF COMPLAINT:  Seizures.  HISTORY OF PRESENT ILLNESS:   Hx from chart.  82 yo female brought to ER due to confusion after falling.  She was also having visual hallucinations.  She was noted to have seizure on 2/08 and neurology consulted.  She was also noted to have fever.  She had LP that was unrevealing.  She was started on abx for possible UTI, and acyclovir for possible encephalitis.  Her seizures persistent, and neuro recommend transfer to ICU for diprivan coma and intubation.  CULTURES: 2/07 Urine >> diphtheroids 2/07 Blood >>NGTD>> 2/09 CSF >>NGTD>>  ANTIBIOTICS: 2/06 Levaquin >>off 2/09 Acyclovir >> 2/11 2/10 Vanco>>off 2/10 ceftaz>> 2/11 sputum>>    STUDIES and events:  2/06 Admit 2/08 Noted to have seizure, neuro consulted 2/06 CT head >> atrophy 2/07 MRI brain >> remote basal ganglia and corona radiata infarcts b/l, remote Rt thalamus and posterior limb internal capsule infarcts, global atrophy 2/08 EEG >> lt temporal region focal seizures, lt hemisphere slowing 2/09 LP >> 140 RBC, 2 WBC, protein 38, glucose 51 2/10 Status epilepticus >> to ICU, diprivan coma 2/10 ETT >>  2./12 - Sedated on vent, szs overnight  2/13 - afebrile with ongoing c-eeg and diprivan   SUBJECTIVE/OVERNIGHT/INTERVAL HX 10/16/15 - c-EEG off. PEr tech - PLEDS +. MRI ordered. c-versed gtt started by neuro per RN. Poorly responsive of diprivan x 24h  VITAL SIGNS: BP 163/72 mmHg  Pulse 94  Temp(Src) 98.9 F (37.2 C) (Axillary)  Resp 16  Ht 5\' 3"  (1.6 m)  Wt 59 kg (130 lb 1.1 oz)  BMI 23.05 kg/m2  SpO2 100%  HEMODYNAMICS:    VENTILATOR SETTINGS: Vent Mode:  [-] PSV;CPAP FiO2 (%):  [40 %] 40 % Set Rate:  [10 bmp] 10 bmp Vt Set:  [450 mL] 450 mL PEEP:  [5 cmH20] 5 cmH20 Pressure Support:   [5 cmH20-8 cmH20] 5 cmH20 Plateau Pressure:  [11 cmH20-12 cmH20] 11 cmH20  INTAKE / OUTPUT: I/O last 3 completed shifts: In: 4057.5 [I.V.:1850; NG/GT:1577.5; IV Piggyback:630] Out: 1250 [Urine:1250]  PHYSICAL EXAMINATION: General:  Ill appearing Neuro:  Occ moves all 4s and withdraws to pain but not awake HEENT: No JVD Cardiovascular:  Regular, no murmur Lungs:  B/l rhonchi Abdomen:  Soft, nontender Musculoskeletal:  No edema Skin:  No rashes  LABS:  BMET  Recent Labs Lab 10/14/15 1943 10/15/15 0421 10/16/15 0831  NA 138 137 137  K 4.1 3.6 3.2*  CL 104 102 97*  CO2 24 26 28   BUN 16 15 19   CREATININE 0.65 0.57 0.57  GLUCOSE 155* 128* 122*    Electrolytes  Recent Labs Lab 10/14/15 0413 10/14/15 1943 10/15/15 0421 10/16/15 0831  CALCIUM 8.5* 8.8* 8.5* 9.3  MG 2.3  --  1.7 1.8  PHOS 1.9*  --  2.9 2.7    CBC  Recent Labs Lab 10/14/15 0413 10/15/15 0421 10/16/15 0831  WBC 7.2 6.1 7.1  HGB 11.6* 9.9* 10.5*  HCT 34.6* 29.8* 32.5*  PLT 166 192 231    Coag's No results for input(s): APTT, INR in the last 168 hours.  Sepsis Markers No results for input(s): LATICACIDVEN, PROCALCITON, O2SATVEN in the last 168 hours.  ABG  Recent Labs Lab 10/10/15 0925 10/12/15 2013 10/13/15 0019  PHART 7.466* 7.520* 7.494*  PCO2ART  31.7* 27.8* 32.0*  PO2ART 76.8* 543* 176.0*    Liver Enzymes  Recent Labs Lab 10/10/15 1405 10/12/15 0400 10/12/15 1924  AST 25 21 26   ALT 14 10* 12*  ALKPHOS 47 40 43  BILITOT 1.1 0.8 1.1  ALBUMIN 3.6 3.0* 3.2*    Cardiac Enzymes No results for input(s): TROPONINI, PROBNP in the last 168 hours.  Glucose  Recent Labs Lab 10/15/15 1144 10/15/15 1554 10/15/15 2013 10/16/15 0039 10/16/15 0427 10/16/15 0748  GLUCAP 136* 111* 121* 119* 142* 116*    Imaging Dg Chest Port 1 View  10/16/2015  CLINICAL DATA:  Intubated patient, acute encephalopathy, acute respiratory failure. EXAM: PORTABLE CHEST 1 VIEW COMPARISON:   Portable chest x-ray of October 15, 2015. FINDINGS: The lungs remain well-expanded. Minimal density at the lung bases has improved. The endotracheal tube tip lies 3.2 cm above the carina. The esophagogastric tube tip projects below the inferior margin of the image. The heart and pulmonary vascularity are normal. The mediastinum is normal in width. There is no pleural effusion or pneumothorax. The observed bony thorax is unremarkable. IMPRESSION: Minimal persistent basilar subsegmental atelectasis, nearly resolved. No evidence of pneumonia nor CHF. The support tubes are in reasonable position. Electronically Signed   By: David  Martinique M.D.   On: 10/16/2015 07:57    DISCUSSION: 82 yo female with acute encephalopathy and status epilepticus.  She has hx of CVA's and polypharmacy.  She required intubation for diprivan coma to control seizures. 2/12 recurrent szs with diprivan dosed decreased. Dilantin added 2/12.  ASSESSMENT / PLAN:  PULMONARY A: Acute respiratory failure in setting of status epilepticus.   - 2/14 - does not meet sbt criteria due to Acute encephc-EEG P:   Full vent support F/u CXR, ABG  CARDIOVASCULAR A:  Hx of HTN, HLD. P:  Continue zocor Monitor hemodynamics  RENAL  A:    Hypomag    - replete  P:   Replace electrolytes as needed  GASTROINTESTINAL A:   Nutrition. Chronic abdominal pain. P:   Tube feeds while on vent Protonix for SUP  HEMATOLOGIC A:   No acute issues. P:  F/u CBC Lovenox for DVT prevention  INFECTIOUS No results for input(s): PROCALCITON in the last 168 hours. Results for orders placed or performed during the hospital encounter of 10/08/15  Urine culture     Status: None   Collection Time: 10/09/15  1:43 AM  Result Value Ref Range Status   Specimen Description URINE, CATHETERIZED  Final   Special Requests NONE  Final   Culture   Final    >=100,000 COLONIES/mL DIPHTHEROIDS(CORYNEBACTERIUM SPECIES) Standardized susceptibility  testing for this organism is not available.    Report Status 10/10/2015 FINAL  Final  Culture, blood (Routine X 2) w Reflex to ID Panel     Status: None   Collection Time: 10/09/15  5:30 AM  Result Value Ref Range Status   Specimen Description BLOOD LEFT HAND  Final   Special Requests IN PEDIATRIC BOTTLE [place]  Final   Culture NO GROWTH 5 DAYS  Final   Report Status 10/14/2015 FINAL  Final  Culture, blood (Routine X 2) w Reflex to ID Panel     Status: None   Collection Time: 10/09/15  5:38 AM  Result Value Ref Range Status   Specimen Description BLOOD LEFT ARM  Final   Special Requests IN PEDIATRIC BOTTLE [place]  Final   Culture NO GROWTH 5 DAYS  Final   Report Status 10/14/2015 FINAL  Final  MRSA PCR Screening     Status: None   Collection Time: 10/10/15 11:39 PM  Result Value Ref Range Status   MRSA by PCR NEGATIVE NEGATIVE Final    Comment:        The GeneXpert MRSA Assay (FDA approved for NASAL specimens only), is one component of a comprehensive MRSA colonization surveillance program. It is not intended to diagnose MRSA infection nor to guide or monitor treatment for MRSA infections.   CSF culture     Status: None   Collection Time: 10/11/15  1:33 PM  Result Value Ref Range Status   Specimen Description CSF  Final   Special Requests CSF NO 2  Final   Gram Stain   Final    CYTOSPIN SMEAR WBC PRESENT, PREDOMINANTLY PMN NO ORGANISMS SEEN    Culture NO GROWTH 3 DAYS  Final   Report Status 10/14/2015 FINAL  Final  Culture, respiratory (NON-Expectorated)     Status: None   Collection Time: 10/13/15  9:30 AM  Result Value Ref Range Status   Specimen Description ENDOTRACHEAL  Final   Special Requests NONE  Final   Gram Stain   Final    FEW WBC PRESENT,BOTH PMN AND MONONUCLEAR RARE SQUAMOUS EPITHELIAL CELLS PRESENT NO ORGANISMS SEEN Performed at [facility]    Culture   Final    FEW YEAST CONSISTENT WITH CANDIDA SPECIES Performed at [facility]     Report Status 10/16/2015 FINAL  Final     A:   ?UTI. ?Encephalitis. P:   Monitor off abx  Anti-infectives    Start     Dose/Rate Route Frequency Ordered Stop   10/12/15 1945  vancomycin (VANCOCIN) 500 mg in sodium chloride 0.9 % 100 mL IVPB  Status:  Discontinued     500 mg 100 mL/hr over 60 Minutes Intravenous Every 12 hours 10/12/15 1936 10/13/15 1509   10/12/15 1945  cefTAZidime (FORTAZ) 1 g in dextrose 5 % 50 mL IVPB  Status:  Discontinued     1 g 100 mL/hr over 30 Minutes Intravenous Every 12 hours 10/12/15 1936 10/14/15 1159   10/11/15 1400  acyclovir (ZOVIRAX) 500 mg in dextrose 5 % 100 mL IVPB  Status:  Discontinued     500 mg 110 mL/hr over 60 Minutes Intravenous Every 12 hours 10/11/15 1243 10/13/15 0934   10/09/15 0400  levofloxacin (LEVAQUIN) IVPB 750 mg  Status:  Discontinued     750 mg 100 mL/hr over 90 Minutes Intravenous Every 48 hours 10/09/15 0358 10/11/15 1403       ENDOCRINE A:   No acute issues. P:   Monitor CBGs  NEUROLOGIC A:   Acute encephalopathy 2nd to status epilepticus. Hx of anxiety, depression on chronic benzo's. Hx of CVA. Breakthrough szs when diprivan decreased   - MRI ordered and versed gtt ordered  P:   AEDs per neurology Versed gtt Dilantin  GLOBAL 2/13, 2/14  - no family at bedside   The patient is critically ill with multiple organ systems failure and requires high complexity decision making for assessment and support, frequent evaluation and titration of therapies, application of advanced monitoring technologies and extensive interpretation of multiple databases.   Critical Care Time devoted to patient care services described in this note is  30  Minutes. This time reflects time of care of this signee Dr Brand Males. This critical care time does not reflect procedure time, or teaching time or supervisory time of PA/NP/Med student/Med Resident etc but  could involve care discussion time    Dr. Brand Males, M.D.,  [facility].C.P Pulmonary and Critical Care Medicine Staff Physician [place] [facility] Pager: [phone], If no answer or between  15:00h - 7:00h: call [phone]  10/16/2015 10:09 AM

## 2015-10-16 NOTE — Progress Notes (Signed)
Peripherally Inserted Central Catheter/Midline Placement  The IV Nurse has discussed with the patient and/or persons authorized to consent for the patient, the purpose of this procedure and the potential benefits and risks involved with this procedure.  The benefits include less needle sticks, lab draws from the catheter and patient may be discharged home with the catheter.  Risks include, but not limited to, infection, bleeding, blood clot (thrombus formation), and puncture of an artery; nerve damage and irregular heat beat.  Alternatives to this procedure were also discussed.  PICC/Midline Placement Documentation        Kathryn, [name] 10/16/2015, 4:58 PM Consent obtained from son via phone, Joette Catching

## 2015-10-16 NOTE — Progress Notes (Signed)
Pt transported to MRI. FIO2 increased to 100% for transport.

## 2015-10-17 ENCOUNTER — Inpatient Hospital Stay (HOSPITAL_COMMUNITY): Payer: Medicare Other

## 2015-10-17 LAB — MISC LABCORP TEST (SEND OUT): Labcorp test code: 828733

## 2015-10-17 LAB — GLUCOSE, CAPILLARY
GLUCOSE-CAPILLARY: 69 mg/dL (ref 65–99)
GLUCOSE-CAPILLARY: 110 mg/dL — AB (ref 65–99)
GLUCOSE-CAPILLARY: 105 mg/dL — AB (ref 65–99)
Glucose-Capillary: 106 mg/dL — ABNORMAL HIGH (ref 65–99)
Glucose-Capillary: 90 mg/dL (ref 65–99)
Glucose-Capillary: 90 mg/dL (ref 65–99)

## 2015-10-17 LAB — CBC WITH DIFFERENTIAL/PLATELET
BASOS ABS: 0 10*3/uL (ref 0.0–0.1)
Basophils Relative: 0 %
EOS PCT: 8 %
Eosinophils Absolute: 0.5 10*3/uL (ref 0.0–0.7)
HEMATOCRIT: 28.2 % — AB (ref 36.0–46.0)
HEMOGLOBIN: 9.6 g/dL — AB (ref 12.0–15.0)
LYMPHS ABS: 0.9 10*3/uL (ref 0.7–4.0)
LYMPHS PCT: 15 %
MCH: 32 pg (ref 26.0–34.0)
MCHC: 34 g/dL (ref 30.0–36.0)
MCV: 94 fL (ref 78.0–100.0)
Monocytes Absolute: 0.9 10*3/uL (ref 0.1–1.0)
Monocytes Relative: 15 %
NEUTROS ABS: 3.7 10*3/uL (ref 1.7–7.7)
NEUTROS PCT: 62 %
Platelets: 204 10*3/uL (ref 150–400)
RBC: 3 MIL/uL — AB (ref 3.87–5.11)
RDW: 13.4 % (ref 11.5–15.5)
WBC: 5.9 10*3/uL (ref 4.0–10.5)

## 2015-10-17 LAB — MAGNESIUM: MAGNESIUM: 1.7 mg/dL (ref 1.7–2.4)

## 2015-10-17 LAB — PHENYTOIN LEVEL, TOTAL: Phenytoin Lvl: 19.1 ug/mL (ref 10.0–20.0)

## 2015-10-17 LAB — ALBUMIN: Albumin: 2.4 g/dL — ABNORMAL LOW (ref 3.5–5.0)

## 2015-10-17 LAB — PHOSPHORUS: Phosphorus: 2.6 mg/dL (ref 2.5–4.6)

## 2015-10-17 MED ORDER — TOPIRAMATE 25 MG PO TABS
100.0000 mg | ORAL_TABLET | Freq: Two times a day (BID) | ORAL | Status: DC
  Administered 2015-10-17 – 2015-10-25 (×17): 100 mg
  Filled 2015-10-17 (×19): qty 4

## 2015-10-17 MED ORDER — PHENOBARBITAL SODIUM 130 MG/ML IJ SOLN
100.0000 mg | Freq: Three times a day (TID) | INTRAMUSCULAR | Status: DC
  Administered 2015-10-17 – 2015-10-18 (×5): 100 mg via INTRAVENOUS
  Filled 2015-10-17 (×5): qty 1

## 2015-10-17 MED ORDER — SODIUM CHLORIDE 0.9 % IV SOLN
110.0000 mg | Freq: Three times a day (TID) | INTRAVENOUS | Status: DC
  Administered 2015-10-17 – 2015-10-19 (×6): 110 mg via INTRAVENOUS
  Filled 2015-10-17 (×11): qty 2.2

## 2015-10-17 MED ORDER — MAGNESIUM SULFATE 2 GM/50ML IV SOLN
2.0000 g | Freq: Once | INTRAVENOUS | Status: AC
  Administered 2015-10-17: 2 g via INTRAVENOUS
  Filled 2015-10-17: qty 50

## 2015-10-17 MED ORDER — TOPIRAMATE (TOPAMAX) NICU/PEDS ORAL SOLN 20 MG/ML
100.0000 mg | Freq: Two times a day (BID) | ORAL | Status: DC
  Filled 2015-10-17 (×2): qty 16.7

## 2015-10-17 MED ORDER — PREGABALIN 75 MG PO CAPS
75.0000 mg | ORAL_CAPSULE | Freq: Three times a day (TID) | ORAL | Status: DC
Start: 2015-10-17 — End: 2015-10-19
  Administered 2015-10-17 – 2015-10-19 (×7): 75 mg via ORAL
  Filled 2015-10-17 (×7): qty 1

## 2015-10-17 NOTE — Progress Notes (Signed)
PULMONARY / CRITICAL CARE MEDICINE   Name: Kathryn [name] MRN: [id number] DOB: July 10, 1931    ADMISSION DATE:  10/08/2015 CONSULTATION DATE:  10/12/2015  REFERRING MD:  Dr. Janann Colonel, neurology  CHIEF COMPLAINT:  Seizures.  HISTORY OF PRESENT ILLNESS:   Hx from chart.  82 yo female brought to ER due to confusion after falling.  She was also having visual hallucinations.  She was noted to have seizure on 2/08 and neurology consulted.  She was also noted to have fever.  She had LP that was unrevealing.  She was started on abx for possible UTI, and acyclovir for possible encephalitis.  Her seizures persistent, and neuro recommend transfer to ICU for diprivan coma and intubation.  CULTURES: 2/07 Urine >> diphtheroids 2/07 Blood >>NGTD>> 2/09 CSF >>NGTD>>  ANTIBIOTICS: 2/06 Levaquin >>off 2/09 Acyclovir >> 2/11 2/10 Vanco>>off 2/10 ceftaz>> 2/11 sputum>>    STUDIES and events:  2/06 Admit 2/08 Noted to have seizure, neuro consulted 2/06 CT head >> atrophy 2/07 MRI brain >> remote basal ganglia and corona radiata infarcts b/l, remote Rt thalamus and posterior limb internal capsule infarcts, global atrophy 2/08 EEG >> lt temporal region focal seizures, lt hemisphere slowing 2/09 LP >> 140 RBC, 2 WBC, protein 38, glucose 51 2/10 Status epilepticus >> to ICU, diprivan coma 2/10 ETT >>  2./12 - Sedated on vent, szs overnight  2/13 - afebrile with ongoing c-eeg and diprivan 10/16/15 - c-EEG off. PEr tech - PLEDS +. MRI ordered. c-versed gtt started by neuro per RN. Poorly responsive of diprivan x 24h   SUBJECTIVE/OVERNIGHT/INTERVAL HX 10/17/15 - ongoing status. MRI - left thalamic findings due to status per Neuro. Unresponsive  VITAL SIGNS: BP 156/75 mmHg  Pulse 88  Temp(Src) 97.8 F (36.6 C) (Axillary)  Resp 19  Ht 5\' 3"  (1.6 m)  Wt 132 lb 15 oz (60.3 kg)  BMI 23.55 kg/m2  SpO2 100%  HEMODYNAMICS:    VENTILATOR SETTINGS: Vent Mode:  [-] PSV;CPAP FiO2 (%):  [40 %-100 %] 40  % Set Rate:  [10 bmp] 10 bmp Vt Set:  [450 mL] 450 mL PEEP:  [5 cmH20] 5 cmH20 Pressure Support:  [5 cmH20] 5 cmH20 Plateau Pressure:  [13 cmH20-15 cmH20] 14 cmH20  INTAKE / OUTPUT: I/O last 3 completed shifts: In: 4165.4 [I.V.:1740.4; NG/GT:1725; IV Piggyback:700] Out: -   PHYSICAL EXAMINATION: General:  Ill appearing Neuro:  Occ moves all 4s and withdraws to pain but not awake HEENT: No JVD Cardiovascular:  Regular, no murmur Lungs:  B/l rhonchi Abdomen:  Soft, nontender Musculoskeletal:  No edema Skin:  No rashes  LABS:  PULMONARY  Recent Labs Lab 10/12/15 2013 10/13/15 0019  PHART 7.520* 7.494*  PCO2ART 27.8* 32.0*  PO2ART 543* 176.0*  HCO3 22.5 24.6*  TCO2 23.4 26  O2SAT 100.0 100.0    CBC  Recent Labs Lab 10/15/15 0421 10/16/15 0831 10/17/15 0500  HGB 9.9* 10.5* 9.6*  HCT 29.8* 32.5* 28.2*  WBC 6.1 7.1 5.9  PLT 192 231 204    COAGULATION No results for input(s): INR in the last 168 hours.  CARDIAC  No results for input(s): TROPONINI in the last 168 hours. No results for input(s): PROBNP in the last 168 hours.   CHEMISTRY  Recent Labs Lab 10/13/15 0512 10/13/15 1516 10/14/15 0413 10/14/15 1943 10/15/15 0421 10/16/15 0831 10/17/15 0500  NA  --  136 138 138 137 137  --   K  --  3.1* 2.8* 4.1 3.6 3.2*  --  CL  --  104 106 104 102 97*  --   CO2  --  24 23 24 26 28   --   GLUCOSE  --  133* 117* 155* 128* 122*  --   BUN  --  18 18 16 15 19   --   CREATININE  --  0.87 0.74 0.65 0.57 0.57  --   CALCIUM  --  8.8* 8.5* 8.8* 8.5* 9.3  --   MG 1.8  --  2.3  --  1.7 1.8 1.7  PHOS 3.0  --  1.9*  --  2.9 2.7 2.6   Estimated Creatinine Clearance: 44.1 mL/min (by C-G formula based on Cr of 0.57).   LIVER  Recent Labs Lab 10/10/15 1405 10/12/15 0400 10/12/15 1924 10/17/15 0500  AST 25 21 26   --   ALT 14 10* 12*  --   ALKPHOS 47 40 43  --   BILITOT 1.1 0.8 1.1  --   PROT 6.6 5.8* 6.2*  --   ALBUMIN 3.6 3.0* 3.2* 2.4*      INFECTIOUS No results for input(s): LATICACIDVEN, PROCALCITON in the last 168 hours.   ENDOCRINE CBG (last 3)   Recent Labs  10/16/15 2308 10/17/15 0314 10/17/15 0745  GLUCAP 99 106* 69         IMAGING x48h  - image(s) personally visualized  -   highlighted in bold Mr Kizzie Fantasia Contrast  10/16/2015  CLINICAL DATA:  Seizure. EXAM: MRI HEAD WITHOUT AND WITH CONTRAST TECHNIQUE: Multiplanar, multiecho pulse sequences of the brain and surrounding structures were obtained without and with intravenous contrast. CONTRAST:  37mL MULTIHANCE GADOBENATE DIMEGLUMINE 529 MG/ML IV SOLN COMPARISON:  10/09/2015 FINDINGS: Calvarium and upper cervical spine: No focal marrow signal abnormality. Orbits: Negative. Sinuses and Mastoids: Layering nasopharyngeal fluid in the setting of intubation. Small bilateral mastoid effusions. Brain: There is restricted diffusion with comparatively mild FLAIR hyperintensity in the posterior and medial left thalamus which is newly seen. This area is nonenhancing and swollen. Given the clinical circumstances, seizure related phenomenon (literature describes these findings in ipsilateral pulvinar region in status epilepticus) is favored, but vasculitis or infectious encephalitis are differential considerations. Rapid development and lack of enhancement is inconsistent with neoplasm. The deep venous structures normally enhance. There are no cortical findings to explain the patient's seizures, including mass, gliosis, or cortical swelling (lateral left temporal lobe DWI signal on axial FLAIR sequence has no correlate swelling or T2 hyperintensity on thin section coronal T2 weighted imaging). FLAIR hyperintensity within sulci bilaterally is likely related to patient's ventilator status. Sulcal debris or hemorrhage should have been present on recent lumbar puncture. There is patchy FLAIR hyperintensity in the bilateral cerebral white matter and pons which is stable from prior  and compatible with chronic small vessel disease. Generalized atrophy. IMPRESSION: 1. New signal abnormality in the left thalamus as described above. This appearance could be seen with status epilepticus, infarct, encephalitis, or vasculitis. 2. No specific cortical finding to explain seizure. Electronically Signed   By: Monte Fantasia M.D.   On: 10/16/2015 13:41   Dg Chest Port 1 View  10/17/2015  CLINICAL DATA:  Endotracheal tube placement. EXAM: PORTABLE CHEST 1 VIEW COMPARISON:  10/16/2015 FINDINGS: Patient slightly rotated to the right endotracheal tube tip is 1.8 cm above the carina. Nasogastric tube courses into the region of the stomach and off the inferior portion of the film as tip is not visualized. Interval placement of right-sided PICC line with tip overlying the region  of the SVC. Lungs are adequately inflated without focal consolidation or effusion. Cardiomediastinal silhouette and remainder of the exam is unchanged. IMPRESSION: No acute cardiopulmonary disease. Tubes and lines as described. Electronically Signed   By: Marin Olp M.D.   On: 10/17/2015 07:58   Dg Chest Port 1 View  10/16/2015  CLINICAL DATA:  Intubated patient, acute encephalopathy, acute respiratory failure. EXAM: PORTABLE CHEST 1 VIEW COMPARISON:  Portable chest x-ray of October 15, 2015. FINDINGS: The lungs remain well-expanded. Minimal density at the lung bases has improved. The endotracheal tube tip lies 3.2 cm above the carina. The esophagogastric tube tip projects below the inferior margin of the image. The heart and pulmonary vascularity are normal. The mediastinum is normal in width. There is no pleural effusion or pneumothorax. The observed bony thorax is unremarkable. IMPRESSION: Minimal persistent basilar subsegmental atelectasis, nearly resolved. No evidence of pneumonia nor CHF. The support tubes are in reasonable position. Electronically Signed   By: David  Martinique M.D.   On: 10/16/2015 07:57        DISCUSSION: 82 yo female with acute encephalopathy and status epilepticus.  She has hx of CVA's and polypharmacy.  She required intubation for diprivan coma to control seizures. 2/12 recurrent szs with diprivan dosed decreased. Dilantin added 2/12.  ASSESSMENT / PLAN:  PULMONARY A: Acute respiratory failure in setting of status epilepticus.   - 2/15 - does not meet sbt criteria due to Acute encephc-EEG P:   Full vent support F/u CXR, ABG  CARDIOVASCULAR A:  Hx of HTN, HLD. P:  Continue zocor Monitor hemodynamics  RENAL  A:    Hypomag    - replete  P:   Replace electrolytes as needed  GASTROINTESTINAL A:   Nutrition. Chronic abdominal pain. P:   Tube feeds while on vent Protonix for SUP  HEMATOLOGIC A:   No acute issues. P:  F/u CBC Lovenox for DVT prevention  INFECTIOUS No results for input(s): PROCALCITON in the last 168 hours. Results for orders placed or performed during the hospital encounter of 10/08/15  Urine culture     Status: None   Collection Time: 10/09/15  1:43 AM  Result Value Ref Range Status   Specimen Description URINE, CATHETERIZED  Final   Special Requests NONE  Final   Culture   Final    >=100,000 COLONIES/mL DIPHTHEROIDS(CORYNEBACTERIUM SPECIES) Standardized susceptibility testing for this organism is not available.    Report Status 10/10/2015 FINAL  Final  Culture, blood (Routine X 2) w Reflex to ID Panel     Status: None   Collection Time: 10/09/15  5:30 AM  Result Value Ref Range Status   Specimen Description BLOOD LEFT HAND  Final   Special Requests IN PEDIATRIC BOTTLE 1CC  Final   Culture NO GROWTH 5 DAYS  Final   Report Status 10/14/2015 FINAL  Final  Culture, blood (Routine X 2) w Reflex to ID Panel     Status: None   Collection Time: 10/09/15  5:38 AM  Result Value Ref Range Status   Specimen Description BLOOD LEFT ARM  Final   Special Requests IN PEDIATRIC BOTTLE 1CC  Final   Culture NO GROWTH 5 DAYS   Final   Report Status 10/14/2015 FINAL  Final  MRSA PCR Screening     Status: None   Collection Time: 10/10/15 11:39 PM  Result Value Ref Range Status   MRSA by PCR NEGATIVE NEGATIVE Final    Comment:        The  GeneXpert MRSA Assay (FDA approved for NASAL specimens only), is one component of a comprehensive MRSA colonization surveillance program. It is not intended to diagnose MRSA infection nor to guide or monitor treatment for MRSA infections.   CSF culture     Status: None   Collection Time: 10/11/15  1:33 PM  Result Value Ref Range Status   Specimen Description CSF  Final   Special Requests CSF NO 2  Final   Gram Stain   Final    CYTOSPIN SMEAR WBC PRESENT, PREDOMINANTLY PMN NO ORGANISMS SEEN    Culture NO GROWTH 3 DAYS  Final   Report Status 10/14/2015 FINAL  Final  Culture, respiratory (NON-Expectorated)     Status: None   Collection Time: 10/13/15  9:30 AM  Result Value Ref Range Status   Specimen Description ENDOTRACHEAL  Final   Special Requests NONE  Final   Gram Stain   Final    FEW WBC PRESENT,BOTH PMN AND MONONUCLEAR RARE SQUAMOUS EPITHELIAL CELLS PRESENT NO ORGANISMS SEEN Performed at [facility]    Culture   Final    FEW YEAST CONSISTENT WITH CANDIDA SPECIES Performed at [facility]    Report Status 10/16/2015 FINAL  Final     A:   ?UTI. ?Encephalitis. P:   Monitor off abx  Anti-infectives    Start     Dose/Rate Route Frequency Ordered Stop   10/12/15 1945  vancomycin (VANCOCIN) 500 mg in sodium chloride 0.9 % 100 mL IVPB  Status:  Discontinued     500 mg 100 mL/hr over 60 Minutes Intravenous Every 12 hours 10/12/15 1936 10/13/15 1509   10/12/15 1945  cefTAZidime (FORTAZ) 1 g in dextrose 5 % 50 mL IVPB  Status:  Discontinued     1 g 100 mL/hr over 30 Minutes Intravenous Every 12 hours 10/12/15 1936 10/14/15 1159   10/11/15 1400  acyclovir (ZOVIRAX) 500 mg in dextrose 5 % 100 mL IVPB  Status:  Discontinued     500 mg 110  mL/hr over 60 Minutes Intravenous Every 12 hours 10/11/15 1243 10/13/15 0934   10/09/15 0400  levofloxacin (LEVAQUIN) IVPB 750 mg  Status:  Discontinued     750 mg 100 mL/hr over 90 Minutes Intravenous Every 48 hours 10/09/15 0358 10/11/15 1403       ENDOCRINE A:   No acute issues. P:   Monitor CBGs  NEUROLOGIC A:   Acute encephalopathy 2nd to status epilepticus. Hx of anxiety, depression on chronic benzo's. Hx of CVA. Breakthrough szs when diprivan decreased   - back on c-EEG and with ongoing status  P:   AEDs per neurology  GLOBAL 2/13, 2/14, 2./15/17   - no family at bedside. Per neuro to see how course evolves next 1-3 days and decide on goals of care   The patient is critically ill with multiple organ systems failure and requires high complexity decision making for assessment and support, frequent evaluation and titration of therapies, application of advanced monitoring technologies and extensive interpretation of multiple databases.   Critical Care Time devoted to patient care services described in this note is  30  Minutes. This time reflects time of care of this signee Dr Brand Males. This critical care time does not reflect procedure time, or teaching time or supervisory time of PA/NP/Med student/Med Resident etc but could involve care discussion time    Dr. Brand Males, M.D., [facility].C.P Pulmonary and Critical Care Medicine Staff [place] [facility]  Pager: [phone], If no answer or between  15:00h - 7:00h: call [phone]  10/17/2015 9:44 AM

## 2015-10-17 NOTE — Procedures (Signed)
Electroencephalogram report- LTM   Ordering Physician : DR Silverio Decamp   EEG [id number]  Data acquisition: 10-20 electrode placement. Additional T1, T2, and EKG electrodes; 26 channel digital referential acquisition reformatted to 18 channel/7 channel coronal bipolar    Spike detection: ON  Seizure detection: ON   Beginning time: 10/15/15 Ending time:10/17/15  Day of study: day 5 , day 6   This intensive EEG monitoring with simultaneous video monitoring was performed for this patient left hemispheric PLEDs and left hemispheric seizures as a part of ongoing series to ensure resolution of subclinical electrographic seizures.  Medications: Patient is intubated and sedated with propofol. Medications include Keppra and vimpat phenytoin  Day 5: There was no pushbutton activations events during this recording. Throughout the recording background activities marked by predominantly 1.5-2 cps low amplitude delta slowing with admixed faster frequencies in the theta and alpha range across right hemisphere more prominently. When patient was awake surrounded background activities attenuating becomes faster across both hemispheres.  Superimposed left hemispheric periodic lateralized epileptiform discharges were present throughout the recording with frequency 1-2 cps. Maximum negativity left central parietal cortex. At times left hemispheric periodic lateralized epileptiform discharges become rhythmic, synchronized evolving in morphology, frequency and amplitude and consistent with a subclinical electrographic seizures arising from the left hemisphere. There is no clinical correlate.   Day 6:  There was no pushbutton activations events during this recording. bBckground activities marked by predominantly 1.5-2 cps low amplitude delta slowing with admixed faster frequencies in the theta and alpha range across right hemisphere more prominently.Background activities were reactive to external  stimuli when  background activities become faster,  Attenuated.    Superimposed left hemispheric periodic lateralized epileptiform discharges were present throughout the recording with frequency 1-2 cps, and were activated by drowsiness and sleep.There are no clinical or subclinical seizures present.    Clinical interpretation: This  intensive EEG monitoring with simultaneous monitoring again demonstrated  left hemispheric periodic lateralized epileptiform discharges as discussed above. Left hemispheric subclinical electrographic seizures were not present during last 24 hours of recording. This findings suggestive of stable significant degree of cortical irritability in the left hemisphere however it has been an improvement as electrographic seizures were not present during last 24 hours of recording.  Clinical correlation is advised. These findings were discussed with ordering team at the time of interpretation.

## 2015-10-17 NOTE — Progress Notes (Signed)
Interval History:                                                                                                                      Kathryn [name] is an 82 y.o. female patient with left hemispheric nonconvulsive electrographic seizures , in intractable status epilepticus. MRI of the brain showed restricted diffusion involving the left insular cortex and left thalamus which is likely secondary to the intractable status epilepticus from the left insular cortical area. Clinically patient is intubated, sedated.   EEG in the past 24 hours has significant improved, with what appears to be resolution of her traffic seizures although continues to have the abnormal epileptiform discharges in the left frontotemporal leads without a clear pattern of evolution to suggest seizures. The abnormal discharges slightly less frequent, which is again suggestive of some improvement noted. She's been started on Lyrica 25 mg 3 times a day yesterday and her Dilantin dose was increased based on therapeutic monitoring. She is already on maximal dose of Vimpat 200 twice a day Keppra 2000 twice a day, and propofol and Versed infusions.    Past Medical History: Past Medical History  Diagnosis Date  . Depression   . Hypertension   . High cholesterol   . Rectal prolapse   . Hx of adenomatous colonic polyps   . Internal hemorrhoids   . Hypothyroidism   . Fecal incontinence   . TIA (transient ischemic attack)   . History of frequent urinary tract infections     recent  . Migraine   . Fatty liver 03/20/13  . Generalized ischemic cerebrovascular disease 10/09/2015  . Stroke [facility]) Sept 1, 2010; Sept 12, 2011    Past Surgical History  Procedure Laterality Date  . Colon surgery  2010, for prolapsed organs after hystecrtomy surgery  . Appendectomy  2008  . Cholecystectomy    . Lumbar laminectomy/decompression microdiscectomy N/A 01/07/2013    Procedure: LUMBAR LAMINECTOMY CENTRAL DECOMPRESSION L4-L5, BILATERAL FORAMENOTOMY  L4,L5    ;  Surgeon: Tobi Bastos, MD;  Location: [facility];  Service: Orthopedics;  Laterality: N/A;  . Back surgery    . Hemorrhoid surgery  09/2008    Archie Endo 12/31/2010  . Abdominal hysterectomy  05/2007    Archie Endo 01/02/2011    Family History: Family History  Problem Relation Age of Onset  . Stroke Father   . Migraines Father   . CVA Father   . Heart attack Father   . Hypertension Maternal Aunt     x3  . Colon cancer Neg Hx   . CVA Mother     Social History:   reports that she has never smoked. She has never used smokeless tobacco. She reports that she does not drink alcohol or use illicit drugs.  Allergies:  Allergies  Allergen Reactions  . Augmentin [Amoxicillin-Pot Clavulanate] Swelling and Diarrhea    Throat   . Citalopram Swelling    Eyes ears and throat swelling Throat and eyes   . Codeine Anaphylaxis  and Shortness Of Breath  . Fish-Derived Products Anaphylaxis  . Hyoscyamine Anaphylaxis and Swelling  . Ibuprofen Swelling    Throat and eyes   . Latex Swelling and Rash    Swelling to troat  . Lipitor [Atorvastatin] Swelling    Muscle aches throat  . Septra [Bactrim] Anaphylaxis  . Shellfish Allergy Anaphylaxis  . Miconazole Rash  . Keflet [Cephalexin] Diarrhea    Upset stomach  . Meloxicam Diarrhea and Nausea And Vomiting  . Sulfa Antibiotics     Rash and also throat was tight  . Verapamil     Tachycardia and flushing  . Vicodin [Hydrocodone-Acetaminophen] Other (See Comments)    stroke  . Zoloft [Sertraline Hcl] Swelling    Red spots all over face, swelling of tongue and legs.   . Ciprofloxacin Nausea And Vomiting    Other reaction(s): Abdominal Pain  . Depakene [Valproate Sodium] Hives    All over body   . Isoptin Sr [Verapamil Hcl Er] Cough  . Lisinopril Cough    Fatigue and cough  . Telmisartan-Hctz Cough     Medications:                                                                                                                          Current facility-administered medications:  .  0.9 %  sodium chloride infusion, , Intravenous, Continuous, Chesley Mires, MD, Last Rate: 50 mL/hr at 10/17/15 0900 .  Place/Maintain arterial line, , , Until Discontinued **AND** 0.9 %  sodium chloride infusion, , Intra-arterial, PRN, Javier Glazier, MD, Stopped at 10/14/15 1151 .  acetaminophen (TYLENOL) solution 650 mg, 650 mg, Oral, Q6H PRN, Chesley Mires, MD .  antiseptic oral rinse solution (CORINZ), 7 mL, Mouth Rinse, 10 times per day, Chesley Mires, MD, 7 mL at 10/17/15 1755 .  bisacodyl (DULCOLAX) suppository 10 mg, 10 mg, Rectal, Daily PRN, Chesley Mires, MD .  chlorhexidine gluconate (PERIDEX) 0.12 % solution 15 mL, 15 mL, Mouth Rinse, BID, Chesley Mires, MD, 15 mL at 10/17/15 0805 .  enoxaparin (LOVENOX) injection 40 mg, 40 mg, Subcutaneous, Q24H, Raelyn Ensign Sumner, DO, 40 mg at 10/17/15 1029 .  feeding supplement (VITAL AF 1.2 CAL) liquid 1,000 mL, 1,000 mL, Per Tube, Continuous, Brand Males, MD, Last Rate: 50 mL/hr at 10/17/15 0800, 1,000 mL at 10/17/15 0800 .  fentaNYL (SUBLIMAZE) injection 50 mcg, 50 mcg, Intravenous, Q2H PRN, Chesley Mires, MD, 50 mcg at 10/16/15 [id number] .  hydrALAZINE (APRESOLINE) injection 5 mg, 5 mg, Intravenous, Q2H PRN, Ivor Costa, MD, 5 mg at 10/15/15 1422 .  insulin aspart (novoLOG) injection 0-15 Units, 0-15 Units, Subcutaneous, 6 times per day, Chesley Mires, MD, 2 Units at 10/16/15 0427 .  lacosamide (VIMPAT) 200 mg in sodium chloride 0.9 % 25 mL IVPB, 200 mg, Intravenous, Q12H, Erik Nessel Fuller Mandril, MD, 200 mg at 10/17/15 0925 .  levETIRAcetam (KEPPRA) 2,000 mg in sodium chloride 0.9 % 100 mL IVPB, 2,000 mg, Intravenous, Q12H, Raffi Milstein Narayan  Fenton Malling, MD, 2,000 mg at 10/17/15 1122 .  LORazepam (ATIVAN) injection 0.5 mg, 0.5 mg, Intravenous, Q4H PRN, Ivor Costa, MD, 0.5 mg at 10/10/15 0423 .  midazolam (VERSED) 50 mg in sodium chloride 0.9 % 50 mL (1 mg/mL) infusion, 5 mg/hr, Intravenous, Continuous, Alajia Schmelzer  Fuller Mandril, MD, Last Rate: 3 mL/hr at 10/17/15 1300, 3 mg/hr at 10/17/15 1300 .  midazolam (VERSED) injection 1 mg, 1 mg, Intravenous, Q15 min PRN, Chesley Mires, MD .  midazolam (VERSED) injection 1 mg, 1 mg, Intravenous, Q2H PRN, Chesley Mires, MD .  nystatin-triamcinolone (MYCOLOG II) cream 1 application, 1 application, Topical, BID, Lauren D Bajbus, RPH, 1 application at [id number] .  [DISCONTINUED] ondansetron (ZOFRAN) tablet 4 mg, 4 mg, Oral, Q6H PRN **OR** ondansetron (ZOFRAN) injection 4 mg, 4 mg, Intravenous, Q6H PRN, Ivor Costa, MD .  pantoprazole sodium (PROTONIX) 40 mg/20 mL oral suspension 40 mg, 40 mg, Per Tube, Q24H, Chesley Mires, MD, 40 mg at 10/16/15 2028 .  PHENObarbital (LUMINAL) injection 100 mg, 100 mg, Intravenous, TID, Kristin Lamagna Fuller Mandril, MD, 100 mg at 10/17/15 1610 .  phenytoin (DILANTIN) 110 mg in sodium chloride 0.9 % 100 mL IVPB, 110 mg, Intravenous, Q8H, Onika Gudiel Fuller Mandril, MD, 110 mg at 10/17/15 1237 .  pregabalin (LYRICA) capsule 75 mg, 75 mg, Oral, TID, Alexsandra Shontz Fuller Mandril, MD, 75 mg at 10/17/15 1610 .  propofol (DIPRIVAN) 1000 MG/100ML infusion, 5-80 mcg/kg/min, Intravenous, Titrated, Emlyn Maves Fuller Mandril, MD, Last Rate: 3.5 mL/hr at 10/17/15 0900, 10 mcg/kg/min at 10/17/15 0900 .  sennosides (SENOKOT) 8.8 MG/5ML syrup 5 mL, 5 mL, Per Tube, BID PRN, Chesley Mires, MD, 5 mL at 10/15/15 0923 .  simvastatin (ZOCOR) tablet 40 mg, 40 mg, Per Tube, q morning - 10a, Chesley Mires, MD, 40 mg at 10/17/15 1030 .  sodium chloride flush (NS) 0.9 % injection 10-40 mL, 10-40 mL, Intracatheter, Q12H, Raylene Miyamoto, MD, 10 mL at 10/17/15 1020 .  sodium chloride flush (NS) 0.9 % injection 10-40 mL, 10-40 mL, Intracatheter, PRN, Raylene Miyamoto, MD .  sodium chloride flush (NS) 0.9 % injection 3 mL, 3 mL, Intravenous, Q12H, Ivor Costa, MD, 3 mL at 10/17/15 1030 .  topiramate (TOPAMAX) tablet 100 mg, 100 mg, Per Tube, BID, Wynell Balloon,  RPH, 100 mg at 10/17/15 1253 .  vitamin B-12 (CYANOCOBALAMIN) tablet 1,000 mcg, 1,000 mcg, Per Tube, Daily, Chesley Mires, MD, 1,000 mcg at 10/17/15 1029 .  vitamin C (ASCORBIC ACID) tablet 500 mg, 500 mg, Per Tube, Daily, Chesley Mires, MD, 500 mg at 10/17/15 1029 .  vitamin e oil OIL 200 Units, 200 Units, Per Tube, Daily, Chesley Mires, MD, 200 Units at 10/17/15 1029   Neurologic Examination:                                                                                                      Blood pressure 145/69, pulse 82, temperature 98.1 F (36.7 C), temperature source Axillary, resp. rate 12, height 5\' 3"  (1.6 m), weight 60.3 kg (132 lb 15 oz), SpO2  100 %.  Patient intubated, sedated. No gaze deviation. Withdraws to stimulus in all 4 extremities.    Lab Results: Basic Metabolic Panel:  Recent Labs Lab 10/13/15 0512 10/13/15 1516 10/14/15 0413 10/14/15 1943 10/15/15 0421 10/16/15 0831 10/17/15 0500  NA  --  136 138 138 137 137  --   K  --  3.1* 2.8* 4.1 3.6 3.2*  --   CL  --  104 106 104 102 97*  --   CO2  --  24 23 24 26 28   --   GLUCOSE  --  133* 117* 155* 128* 122*  --   BUN  --  18 18 16 15 19   --   CREATININE  --  0.87 0.74 0.65 0.57 0.57  --   CALCIUM  --  8.8* 8.5* 8.8* 8.5* 9.3  --   MG 1.8  --  2.3  --  1.7 1.8 1.7  PHOS 3.0  --  1.9*  --  2.9 2.7 2.6    Liver Function Tests:  Recent Labs Lab 10/12/15 0400 10/12/15 1924 10/17/15 0500  AST 21 26  --   ALT 10* 12*  --   ALKPHOS 40 43  --   BILITOT 0.8 1.1  --   PROT 5.8* 6.2*  --   ALBUMIN 3.0* 3.2* 2.4*   No results for input(s): LIPASE, AMYLASE in the last 168 hours. No results for input(s): AMMONIA in the last 168 hours.  CBC:  Recent Labs Lab 10/13/15 0512 10/14/15 0413 10/15/15 0421 10/16/15 0831 10/17/15 0500  WBC 6.4 7.2 6.1 7.1 5.9  NEUTROABS  --   --   --  5.1 3.7  HGB 10.5* 11.6* 9.9* 10.5* 9.6*  HCT 30.7* 34.6* 29.8* 32.5* 28.2*  MCV 87.7 88.7 90.3 92.3 94.0  PLT 153 166 192 231 204     Cardiac Enzymes: No results for input(s): CKTOTAL, CKMB, CKMBINDEX, TROPONINI in the last 168 hours.  Lipid Panel:  Recent Labs Lab 10/12/15 1924 10/15/15 1823 10/16/15 1411  TRIG 46 59 69    CBG:  Recent Labs Lab 10/17/15 0314 10/17/15 0745 10/17/15 0948 10/17/15 1134 10/17/15 1547  GLUCAP 106* 24 90 110* 105*    Microbiology: Results for orders placed or performed during the hospital encounter of 10/08/15  Urine culture     Status: None   Collection Time: 10/09/15  1:43 AM  Result Value Ref Range Status   Specimen Description URINE, CATHETERIZED  Final   Special Requests NONE  Final   Culture   Final    >=100,000 COLONIES/mL DIPHTHEROIDS(CORYNEBACTERIUM SPECIES) Standardized susceptibility testing for this organism is not available.    Report Status 10/10/2015 FINAL  Final  Culture, blood (Routine X 2) w Reflex to ID Panel     Status: None   Collection Time: 10/09/15  5:30 AM  Result Value Ref Range Status   Specimen Description BLOOD LEFT HAND  Final   Special Requests IN PEDIATRIC BOTTLE [place]  Final   Culture NO GROWTH 5 DAYS  Final   Report Status 10/14/2015 FINAL  Final  Culture, blood (Routine X 2) w Reflex to ID Panel     Status: None   Collection Time: 10/09/15  5:38 AM  Result Value Ref Range Status   Specimen Description BLOOD LEFT ARM  Final   Special Requests IN PEDIATRIC BOTTLE [place]  Final   Culture NO GROWTH 5 DAYS  Final   Report Status 10/14/2015 FINAL  Final  MRSA PCR Screening  Status: None   Collection Time: 10/10/15 11:39 PM  Result Value Ref Range Status   MRSA by PCR NEGATIVE NEGATIVE Final    Comment:        The GeneXpert MRSA Assay (FDA approved for NASAL specimens only), is one component of a comprehensive MRSA colonization surveillance program. It is not intended to diagnose MRSA infection nor to guide or monitor treatment for MRSA infections.   CSF culture     Status: None   Collection Time: 10/11/15  1:33 PM   Result Value Ref Range Status   Specimen Description CSF  Final   Special Requests CSF NO 2  Final   Gram Stain   Final    CYTOSPIN SMEAR WBC PRESENT, PREDOMINANTLY PMN NO ORGANISMS SEEN    Culture NO GROWTH 3 DAYS  Final   Report Status 10/14/2015 FINAL  Final  Culture, respiratory (NON-Expectorated)     Status: None   Collection Time: 10/13/15  9:30 AM  Result Value Ref Range Status   Specimen Description ENDOTRACHEAL  Final   Special Requests NONE  Final   Gram Stain   Final    FEW WBC PRESENT,BOTH PMN AND MONONUCLEAR RARE SQUAMOUS EPITHELIAL CELLS PRESENT NO ORGANISMS SEEN Performed at [facility]    Culture   Final    FEW YEAST CONSISTENT WITH CANDIDA SPECIES Performed at [facility]    Report Status 10/16/2015 FINAL  Final    Imaging: Mr Jeri Cos [id number] Contrast  10/16/2015  CLINICAL DATA:  Seizure. EXAM: MRI HEAD WITHOUT AND WITH CONTRAST TECHNIQUE: Multiplanar, multiecho pulse sequences of the brain and surrounding structures were obtained without and with intravenous contrast. CONTRAST:  28mL MULTIHANCE GADOBENATE DIMEGLUMINE 529 MG/ML IV SOLN COMPARISON:  10/09/2015 FINDINGS: Calvarium and upper cervical spine: No focal marrow signal abnormality. Orbits: Negative. Sinuses and Mastoids: Layering nasopharyngeal fluid in the setting of intubation. Small bilateral mastoid effusions. Brain: There is restricted diffusion with comparatively mild FLAIR hyperintensity in the posterior and medial left thalamus which is newly seen. This area is nonenhancing and swollen. Given the clinical circumstances, seizure related phenomenon (literature describes these findings in ipsilateral pulvinar region in status epilepticus) is favored, but vasculitis or infectious encephalitis are differential considerations. Rapid development and lack of enhancement is inconsistent with neoplasm. The deep venous structures normally enhance. There are no cortical findings to explain the  patient's seizures, including mass, gliosis, or cortical swelling (lateral left temporal lobe DWI signal on axial FLAIR sequence has no correlate swelling or T2 hyperintensity on thin section coronal T2 weighted imaging). FLAIR hyperintensity within sulci bilaterally is likely related to patient's ventilator status. Sulcal debris or hemorrhage should have been present on recent lumbar puncture. There is patchy FLAIR hyperintensity in the bilateral cerebral white matter and pons which is stable from prior and compatible with chronic small vessel disease. Generalized atrophy. IMPRESSION: 1. New signal abnormality in the left thalamus as described above. This appearance could be seen with status epilepticus, infarct, encephalitis, or vasculitis. 2. No specific cortical finding to explain seizure. Electronically Signed   By: Monte Fantasia M.D.   On: 10/16/2015 13:41   Dg Chest Port 1 View  10/17/2015  CLINICAL DATA:  Endotracheal tube placement. EXAM: PORTABLE CHEST 1 VIEW COMPARISON:  10/16/2015 FINDINGS: Patient slightly rotated to the right endotracheal tube tip is 1.8 cm above the carina. Nasogastric tube courses into the region of the stomach and off the inferior portion of the film as tip is not visualized. Interval  placement of right-sided PICC line with tip overlying the region of the SVC. Lungs are adequately inflated without focal consolidation or effusion. Cardiomediastinal silhouette and remainder of the exam is unchanged. IMPRESSION: No acute cardiopulmonary disease. Tubes and lines as described. Electronically Signed   By: Marin Olp M.D.   On: 10/17/2015 07:58   Dg Chest Port 1 View  10/16/2015  CLINICAL DATA:  Intubated patient, acute encephalopathy, acute respiratory failure. EXAM: PORTABLE CHEST 1 VIEW COMPARISON:  Portable chest x-ray of October 15, 2015. FINDINGS: The lungs remain well-expanded. Minimal density at the lung bases has improved. The endotracheal tube tip lies 3.2 cm above  the carina. The esophagogastric tube tip projects below the inferior margin of the image. The heart and pulmonary vascularity are normal. The mediastinum is normal in width. There is no pleural effusion or pneumothorax. The observed bony thorax is unremarkable. IMPRESSION: Minimal persistent basilar subsegmental atelectasis, nearly resolved. No evidence of pneumonia nor CHF. The support tubes are in reasonable position. Electronically Signed   By: David  Martinique M.D.   On: 10/16/2015 07:57   Dg Chest Port 1 View  10/15/2015  CLINICAL DATA:  Respiratory failure, cerebral vascular disease, intubated patient. EXAM: PORTABLE CHEST 1 VIEW COMPARISON:  Portable chest x-ray dated October 14, 2015. FINDINGS: The lungs are hyperinflated. There is minimal increased density in the left lateral costophrenic angle today consistent with worsening atelectasis or trace of pleural fluid. There is minimal blunting of the right lateral costophrenic angle which is also more conspicuous. The heart is normal in size. The pulmonary vascularity is not engorged. There is no pneumothorax or pneumomediastinum. The endotracheal tube tip lies 4 cm above the carina. The esophagogastric tube tip projects below the inferior margin of the image. IMPRESSION: Slight interval increased density at both lung bases may reflect very early subsegmental atelectasis. There is no alveolar pneumonia nor large pleural effusion. The support tubes are in reasonable position. Electronically Signed   By: David  Martinique M.D.   On: 10/15/2015 07:45   Dg Chest Port 1 View  10/14/2015  CLINICAL DATA:  Hypoxia EXAM: PORTABLE CHEST 1 VIEW COMPARISON:  October 13, 2015 FINDINGS: Endotracheal tube tip is 3.1 cm above the carina. Nasogastric tube tip and side port are below the diaphragm. No pneumothorax. Lungs are clear. The heart size and pulmonary vascularity are normal. No adenopathy. No bone lesions. IMPRESSION: Tube positions as described without pneumothorax.  No edema or consolidation. Electronically Signed   By: Lowella Grip III M.D.   On: 10/14/2015 07:53    Assessment and plan:   CHEE SULEK is an 82 y.o. female patient with intractable left hemispheric electrographic seizures. She is on several seizure medications as described above. With the change in the medication regimen yesterday, there has been some noticeable improvement in the EEG with what appears to be resolution of the electrographic seizures, although continues to have the abnormal discharges in the left frontotemporal leads. These abnormal discharges have been less frequent compared to prior EEG. Overall this is a significant improvement. Recommend further changes to the antiepileptic medication regimen, as follows. We will increase Lyrica to 75 mg 3 times a day, added Topamax 100 mg twice a day, based on supratherapeutic Dilantin level will read slightly reduce the Dilantin dose to 110 mg every 8 hours, added phenobarbital 100 mg 3 times a day. She is on low doses of Versed and propofol infusions. Continue same dose of Vimpat 200 mg twice a day and Keppra 2000  g twice a day. We'll continue the long-term EEG monitoring. Discussed with Dr. Chase Caller.   Neurology service will continue to follow up. Please call for any further questions.

## 2015-10-18 ENCOUNTER — Inpatient Hospital Stay (HOSPITAL_COMMUNITY): Payer: Medicare Other

## 2015-10-18 LAB — GLUCOSE, CAPILLARY
GLUCOSE-CAPILLARY: 102 mg/dL — AB (ref 65–99)
GLUCOSE-CAPILLARY: 93 mg/dL (ref 65–99)
GLUCOSE-CAPILLARY: 104 mg/dL — AB (ref 65–99)
Glucose-Capillary: 102 mg/dL — ABNORMAL HIGH (ref 65–99)
Glucose-Capillary: 90 mg/dL (ref 65–99)
Glucose-Capillary: 104 mg/dL — ABNORMAL HIGH (ref 65–99)
Glucose-Capillary: 111 mg/dL — ABNORMAL HIGH (ref 65–99)

## 2015-10-18 LAB — CBC WITH DIFFERENTIAL/PLATELET
BASOS ABS: 0 10*3/uL (ref 0.0–0.1)
Basophils Relative: 1 %
EOS ABS: 0.6 10*3/uL (ref 0.0–0.7)
EOS PCT: 13 %
HEMATOCRIT: 27.9 % — AB (ref 36.0–46.0)
Hemoglobin: 8.9 g/dL — ABNORMAL LOW (ref 12.0–15.0)
LYMPHS ABS: 1 10*3/uL (ref 0.7–4.0)
LYMPHS PCT: 22 %
MCH: 30.1 pg (ref 26.0–34.0)
MCHC: 31.9 g/dL (ref 30.0–36.0)
MCV: 94.3 fL (ref 78.0–100.0)
Monocytes Absolute: 0.7 10*3/uL (ref 0.1–1.0)
Monocytes Relative: 16 %
NEUTROS PCT: 48 %
Neutro Abs: 2.1 10*3/uL (ref 1.7–7.7)
Platelets: 230 10*3/uL (ref 150–400)
RBC: 2.96 MIL/uL — ABNORMAL LOW (ref 3.87–5.11)
RDW: 13.6 % (ref 11.5–15.5)
WBC: 4.4 10*3/uL (ref 4.0–10.5)

## 2015-10-18 LAB — BASIC METABOLIC PANEL
Anion gap: 8 (ref 5–15)
BUN: 21 mg/dL — AB (ref 6–20)
CHLORIDE: 103 mmol/L (ref 101–111)
CO2: 28 mmol/L (ref 22–32)
CREATININE: 0.48 mg/dL (ref 0.44–1.00)
Calcium: 8.9 mg/dL (ref 8.9–10.3)
GFR calc Af Amer: >60 mL/min (ref >60)
GFR calc non Af Amer: >60 mL/min (ref >60)
Glucose, Bld: 122 mg/dL — ABNORMAL HIGH (ref 65–99)
Potassium: 2.9 mmol/L — ABNORMAL LOW (ref 3.5–5.1)
Sodium: 139 mmol/L (ref 135–145)

## 2015-10-18 LAB — TRIGLYCERIDES: Triglycerides: 99 mg/dL (ref <150)

## 2015-10-18 LAB — PHOSPHORUS: Phosphorus: 2.5 mg/dL (ref 2.5–4.6)

## 2015-10-18 LAB — PHENYTOIN LEVEL, FREE AND TOTAL
PHENYTOIN, TOTAL: 21.7 ug/mL — AB (ref 10.0–20.0)
Phenytoin, Free: 2.8 ug/mL — ABNORMAL HIGH (ref 1.0–2.0)

## 2015-10-18 LAB — MAGNESIUM: MAGNESIUM: 1.8 mg/dL (ref 1.7–2.4)

## 2015-10-18 MED ORDER — POTASSIUM CHLORIDE 10 MEQ/50ML IV SOLN
10.0000 meq | INTRAVENOUS | Status: AC
  Administered 2015-10-18 (×4): 10 meq via INTRAVENOUS
  Filled 2015-10-18 (×4): qty 50

## 2015-10-18 MED ORDER — PHENOBARBITAL SODIUM 130 MG/ML IJ SOLN
130.0000 mg | Freq: Three times a day (TID) | INTRAMUSCULAR | Status: DC
  Administered 2015-10-18 – 2015-10-19 (×3): 130 mg via INTRAVENOUS
  Filled 2015-10-18 (×3): qty 1

## 2015-10-18 MED ORDER — POTASSIUM CHLORIDE 20 MEQ/15ML (10%) PO SOLN
40.0000 meq | Freq: Once | ORAL | Status: AC
  Administered 2015-10-18: 40 meq
  Filled 2015-10-18: qty 30

## 2015-10-18 NOTE — Procedures (Signed)
Electroencephalogram report- LTM   Ordering Physician : DR Silverio Decamp   EEG [id number]  Data acquisition: 10-20 electrode placement. Additional T1, T2, and EKG electrodes; 26 channel digital referential acquisition reformatted to 18 channel/7 channel coronal bipolar    Spike detection: ON  Seizure detection: ON   Beginning time: 10/15/15 Ending time:10/18/15  Day of study: day 5 , day 6 , day 7  This intensive EEG monitoring with simultaneous video monitoring was performed for this patient left hemispheric PLEDs and left hemispheric seizures as a part of ongoing series to ensure resolution of subclinical electrographic seizures.  Medications: Patient is intubated and sedated with propofol. Medications include Keppra and vimpat phenytoin  Day 5: There was no pushbutton activations events during this recording. Throughout the recording background activities marked by predominantly 1.5-2 cps low amplitude delta slowing with admixed faster frequencies in the theta and alpha range across right hemisphere more prominently. When patient was awake surrounded background activities attenuating becomes faster across both hemispheres.  Superimposed left hemispheric periodic lateralized epileptiform discharges were present throughout the recording with frequency 1-2 cps. Maximum negativity left central parietal cortex. At times left hemispheric periodic lateralized epileptiform discharges become rhythmic, synchronized evolving in morphology, frequency and amplitude and consistent with a subclinical electrographic seizures arising from the left hemisphere. There is no clinical correlate.   Day 6:  There was no pushbutton activations events during this recording. bBckground activities marked by predominantly 1.5-2 cps low amplitude delta slowing with admixed faster frequencies in the theta and alpha range across right hemisphere more prominently.Background activities were reactive to  external stimuli when  background activities become faster and   Attenuated.   Superimposed left hemispheric periodic lateralized epileptiform discharges were present throughout the recording with frequency 1-2 cps, and were activated by drowsiness and sleep.There are no clinical or subclinical seizures present.   Day 7: There is an improvement on this 24-hour EEG recording.  There is no clinical subclinical seizures present.  Background activities consistent with state changes and reactive with arousal.  Left hemispheric periodic lateralized epileptiform discharges become less developed.  Left periodic lateralizing epileptiform discharges were present every 2-3 seconds during first half of the recording.  During second half of the recording the left periodic lateralized epileptiform discharges occur with frequency every 3-5 seconds and more disorganized consistent with improvement.   Clinical interpretation: This  intensive EEG monitoring with simultaneous monitoring  demonstrated an improvement compared to previous recording as  left hemispheric periodic lateralized epileptiform discharges become less developed,  less organized and occur last frequently particular during second half of the recording.  There are no clinical or subclinical seizures present.  Clinical correlation is advised

## 2015-10-18 NOTE — Progress Notes (Signed)
EEG leads checked, no skin breakdown noted at this time.

## 2015-10-18 NOTE — Progress Notes (Signed)
Nutrition Follow-up  INTERVENTION:   Continue Vital AF 1.2 @ 50 ml/hr Provides: 1440 kcal (100% of needs), 90 grams protein, and 973 ml H2O.   NUTRITION DIAGNOSIS:   Inadequate oral intake related to inability to eat as evidenced by NPO status. Ongoing.  GOAL:   Patient will meet greater than or equal to 90% of their needs Met.   MONITOR:   TF tolerance, Vent status, Labs, I & O's  REASON FOR ASSESSMENT:   Consult Enteral/tube feeding initiation and management  ASSESSMENT:   [name] with PMH of HTN, hyperlipidemia, GERD, hypothyroidism, depression, anxiety, TIA, stroke, recurrent UTI, chronic back pain, chronic abdominal pain, who presented with altered mental status and hallucination.  Patient is currently intubated on ventilator support MV: 8.5 L/min Temp (24hrs), Avg:97.9 F (36.6 C), Min:97.2 F (36.2 C), Max:98.6 F (37 C)  Propofol: currently running at 2.2 ml/hr (58 kcal). Per RN to wean off.  Labs reviewed: potassium low 2.9 - on IV replacement as well as vitamin c and e OG tube with Vital AF 1.2 infusing at 50 ml/hr Pt back on continuous EEG.   Diet Order:  Diet NPO time specified  Skin:  Reviewed, no issues  Last BM:  2/15  Height:   Ht Readings from Last 1 Encounters:  10/12/15 5' 3"  (1.6 m)    Weight:   Wt Readings from Last 1 Encounters:  10/18/15 134 lb 11.2 oz (61.1 kg)    Ideal Body Weight:  52 kg  BMI:  Body mass index is 23.87 kg/(m^2).  Estimated Nutritional Needs:   Kcal:  1424  Protein:  75-85 grams  Fluid:  >/= 1.5 L  EDUCATION NEEDS:   No education needs identified at this time  Kathryn [name], [place], [place] Pager [phone] After Hours Pager

## 2015-10-18 NOTE — Progress Notes (Signed)
Interval History:                                                                                                                      Kathryn [name] is an 82 y.o. female patient who is admitted to ICU with the left frontotemporal seizures, with MRI brain showing left peri-insular cortical and left thalamic restricted diffusion secondary to prolonged status epilepticus .  Her seizure medications have been adjusted yesterday, and as a result, the EEG has significantly improvement compared to previous recording as left hemispheric periodic lateralized epileptiform discharges become less developed, less organized and occur last frequently particular during second half of the recording. There are no clinical or subclinical seizures present.  Versed has been discontinued this morning. She is on small dose of propofol gtt at 10 mcg/ kilo per minute. Remains intubated.   Past Medical History: Past Medical History  Diagnosis Date  . Depression   . Hypertension   . High cholesterol   . Rectal prolapse   . Hx of adenomatous colonic polyps   . Internal hemorrhoids   . Hypothyroidism   . Fecal incontinence   . TIA (transient ischemic attack)   . History of frequent urinary tract infections     recent  . Migraine   . Fatty liver 03/20/13  . Generalized ischemic cerebrovascular disease 10/09/2015  . Stroke [facility]) Sept 1, 2010; Sept 12, 2011    Past Surgical History  Procedure Laterality Date  . Colon surgery  2010, for prolapsed organs after hystecrtomy surgery  . Appendectomy  2008  . Cholecystectomy    . Lumbar laminectomy/decompression microdiscectomy N/A 01/07/2013    Procedure: LUMBAR LAMINECTOMY CENTRAL DECOMPRESSION L4-L5, BILATERAL FORAMENOTOMY L4,L5    ;  Surgeon: Tobi Bastos, MD;  Location: [facility] ORS;  Service: Orthopedics;  Laterality: N/A;  . Back surgery    . Hemorrhoid surgery  09/2008    Archie Endo 12/31/2010  . Abdominal hysterectomy  05/2007    Archie Endo 01/02/2011    Family  History: Family History  Problem Relation Age of Onset  . Stroke Father   . Migraines Father   . CVA Father   . Heart attack Father   . Hypertension Maternal Aunt     x3  . Colon cancer Neg Hx   . CVA Mother     Social History:   reports that she has never smoked. She has never used smokeless tobacco. She reports that she does not drink alcohol or use illicit drugs.  Allergies:  Allergies  Allergen Reactions  . Augmentin [Amoxicillin-Pot Clavulanate] Swelling and Diarrhea    Throat   . Citalopram Swelling    Eyes ears and throat swelling Throat and eyes   . Codeine Anaphylaxis and Shortness Of Breath  . Fish-Derived Products Anaphylaxis  . Hyoscyamine Anaphylaxis and Swelling  . Ibuprofen Swelling    Throat and eyes   . Latex Swelling and Rash    Swelling to troat  . Lipitor [Atorvastatin] Swelling  Muscle aches throat  . Septra [Bactrim] Anaphylaxis  . Shellfish Allergy Anaphylaxis  . Miconazole Rash  . Keflet [Cephalexin] Diarrhea    Upset stomach  . Meloxicam Diarrhea and Nausea And Vomiting  . Sulfa Antibiotics     Rash and also throat was tight  . Verapamil     Tachycardia and flushing  . Vicodin [Hydrocodone-Acetaminophen] Other (See Comments)    stroke  . Zoloft [Sertraline Hcl] Swelling    Red spots all over face, swelling of tongue and legs.   . Ciprofloxacin Nausea And Vomiting    Other reaction(s): Abdominal Pain  . Depakene [Valproate Sodium] Hives    All over body   . Isoptin Sr [Verapamil Hcl Er] Cough  . Lisinopril Cough    Fatigue and cough  . Telmisartan-Hctz Cough     Medications:                                                                                                                         Current facility-administered medications:  .  Place/Maintain arterial line, , , Until Discontinued **AND** 0.9 %  sodium chloride infusion, , Intra-arterial, PRN, Javier Glazier, MD, Stopped at 10/14/15 1151 .  acetaminophen  (TYLENOL) solution 650 mg, 650 mg, Oral, Q6H PRN, Chesley Mires, MD .  antiseptic oral rinse solution (CORINZ), 7 mL, Mouth Rinse, 10 times per day, Chesley Mires, MD, 7 mL at 10/18/15 1800 .  bisacodyl (DULCOLAX) suppository 10 mg, 10 mg, Rectal, Daily PRN, Chesley Mires, MD .  chlorhexidine gluconate (PERIDEX) 0.12 % solution 15 mL, 15 mL, Mouth Rinse, BID, Chesley Mires, MD, 15 mL at 10/18/15 2004 .  enoxaparin (LOVENOX) injection 40 mg, 40 mg, Subcutaneous, Q24H, Drema Dallas, DO, 40 mg at 10/18/15 0920 .  feeding supplement (VITAL AF 1.2 CAL) liquid 1,000 mL, 1,000 mL, Per Tube, Continuous, Brand Males, MD, Last Rate: 50 mL/hr at 10/17/15 1900, 1,000 mL at 10/17/15 1900 .  fentaNYL (SUBLIMAZE) injection 50 mcg, 50 mcg, Intravenous, Q2H PRN, Chesley Mires, MD, 50 mcg at 10/16/15 [id number] .  hydrALAZINE (APRESOLINE) injection 5 mg, 5 mg, Intravenous, Q2H PRN, Ivor Costa, MD, 5 mg at 10/15/15 1422 .  insulin aspart (novoLOG) injection 0-15 Units, 0-15 Units, Subcutaneous, 6 times per day, Chesley Mires, MD, 2 Units at 10/16/15 0427 .  lacosamide (VIMPAT) 200 mg in sodium chloride 0.9 % 25 mL IVPB, 200 mg, Intravenous, Q12H, Megumi Treaster Fuller Mandril, MD, 200 mg at 10/18/15 1149 .  levETIRAcetam (KEPPRA) 2,000 mg in sodium chloride 0.9 % 100 mL IVPB, 2,000 mg, Intravenous, Q12H, Chadd Tollison Fuller Mandril, MD, 2,000 mg at 10/18/15 1148 .  LORazepam (ATIVAN) injection 0.5 mg, 0.5 mg, Intravenous, Q4H PRN, Ivor Costa, MD, 0.5 mg at 10/10/15 0423 .  midazolam (VERSED) 50 mg in sodium chloride 0.9 % 50 mL (1 mg/mL) infusion, 5 mg/hr, Intravenous, Continuous, Alexandra Posadas Fuller Mandril, MD, Stopped at 10/18/15 0745 .  midazolam (VERSED) injection 1 mg, 1 mg, Intravenous,  Q15 min PRN, Chesley Mires, MD .  midazolam (VERSED) injection 1 mg, 1 mg, Intravenous, Q2H PRN, Chesley Mires, MD .  nystatin-triamcinolone (MYCOLOG II) cream 1 application, 1 application, Topical, BID, Lauren D Bajbus, RPH, 1 application at  [id number] .  [DISCONTINUED] ondansetron (ZOFRAN) tablet 4 mg, 4 mg, Oral, Q6H PRN **OR** ondansetron (ZOFRAN) injection 4 mg, 4 mg, Intravenous, Q6H PRN, Ivor Costa, MD .  pantoprazole sodium (PROTONIX) 40 mg/20 mL oral suspension 40 mg, 40 mg, Per Tube, Q24H, Chesley Mires, MD, 40 mg at 10/18/15 2003 .  PHENObarbital (LUMINAL) injection 130 mg, 130 mg, Intravenous, TID, Ercia Crisafulli Fuller Mandril, MD .  phenytoin (DILANTIN) 110 mg in sodium chloride 0.9 % 100 mL IVPB, 110 mg, Intravenous, Q8H, Modena Bellemare Fuller Mandril, MD, 110 mg at 10/18/15 2012 .  pregabalin (LYRICA) capsule 75 mg, 75 mg, Oral, TID, Aviyon Hocevar Fuller Mandril, MD, 75 mg at 10/18/15 1625 .  propofol (DIPRIVAN) 1000 MG/100ML infusion, 5-80 mcg/kg/min, Intravenous, Titrated, Aspasia Rude Fuller Mandril, MD, Stopped at 10/18/15 1130 .  sennosides (SENOKOT) 8.8 MG/5ML syrup 5 mL, 5 mL, Per Tube, BID PRN, Chesley Mires, MD, 5 mL at 10/15/15 0923 .  simvastatin (ZOCOR) tablet 40 mg, 40 mg, Per Tube, q morning - 10a, Chesley Mires, MD, 40 mg at 10/18/15 0920 .  sodium chloride flush (NS) 0.9 % injection 10-40 mL, 10-40 mL, Intracatheter, Q12H, Raylene Miyamoto, MD, 10 mL at 10/18/15 0921 .  sodium chloride flush (NS) 0.9 % injection 10-40 mL, 10-40 mL, Intracatheter, PRN, Raylene Miyamoto, MD .  sodium chloride flush (NS) 0.9 % injection 3 mL, 3 mL, Intravenous, Q12H, Ivor Costa, MD, 3 mL at 10/17/15 1030 .  topiramate (TOPAMAX) tablet 100 mg, 100 mg, Per Tube, BID, Wynell Balloon, RPH, 100 mg at 10/18/15 1030 .  vitamin B-12 (CYANOCOBALAMIN) tablet 1,000 mcg, 1,000 mcg, Per Tube, Daily, Chesley Mires, MD, 1,000 mcg at 10/18/15 0920 .  vitamin C (ASCORBIC ACID) tablet 500 mg, 500 mg, Per Tube, Daily, Chesley Mires, MD, 500 mg at 10/18/15 0928 .  vitamin e oil OIL 200 Units, 200 Units, Per Tube, Daily, Chesley Mires, MD, 200 Units at 10/18/15 0920   Neurologic Examination:                                                                                                       Blood pressure 161/90, pulse 103, temperature 99 F (37.2 C), temperature source Axillary, resp. rate 22, height 5\' 3"  (1.6 m), weight 61.1 kg (134 lb 11.2 oz), SpO2 100 %.  Intubated, sedated with propofol. No gaze deviation. No abnormal involuntary movements. Withdraws all 4 extremities to stimulus.   Lab Results: Basic Metabolic Panel:  Recent Labs Lab 10/14/15 0413 10/14/15 1943 10/15/15 0421 10/16/15 0831 10/17/15 0500 10/18/15 0425  NA 138 138 137 137  --  139  K 2.8* 4.1 3.6 3.2*  --  2.9*  CL 106 104 102 97*  --  103  CO2 23 24 26 28   --  28  GLUCOSE 117* 155* 128*  122*  --  122*  BUN 18 16 15 19   --  21*  CREATININE 0.74 0.65 0.57 0.57  --  0.48  CALCIUM 8.5* 8.8* 8.5* 9.3  --  8.9  MG 2.3  --  1.7 1.8 1.7 1.8  PHOS 1.9*  --  2.9 2.7 2.6 2.5    Liver Function Tests:  Recent Labs Lab 10/12/15 0400 10/12/15 1924 10/17/15 0500  AST 21 26  --   ALT 10* 12*  --   ALKPHOS 40 43  --   BILITOT 0.8 1.1  --   PROT 5.8* 6.2*  --   ALBUMIN 3.0* 3.2* 2.4*   No results for input(s): LIPASE, AMYLASE in the last 168 hours. No results for input(s): AMMONIA in the last 168 hours.  CBC:  Recent Labs Lab 10/14/15 0413 10/15/15 0421 10/16/15 0831 10/17/15 0500 10/18/15 0425  WBC 7.2 6.1 7.1 5.9 4.4  NEUTROABS  --   --  5.1 3.7 2.1  HGB 11.6* 9.9* 10.5* 9.6* 8.9*  HCT 34.6* 29.8* 32.5* 28.2* 27.9*  MCV 88.7 90.3 92.3 94.0 94.3  PLT 166 192 231 204 230    Cardiac Enzymes: No results for input(s): CKTOTAL, CKMB, CKMBINDEX, TROPONINI in the last 168 hours.  Lipid Panel:  Recent Labs Lab 10/12/15 1924 10/15/15 1823 10/16/15 1411 10/18/15 1815  TRIG 46 59 69 99    CBG:  Recent Labs Lab 10/18/15 0337 10/18/15 0749 10/18/15 1119 10/18/15 1542 10/18/15 1944  GLUCAP 102* 93 90 104* 111*    Microbiology: Results for orders placed or performed during the hospital encounter of 10/08/15  Urine culture     Status:  None   Collection Time: 10/09/15  1:43 AM  Result Value Ref Range Status   Specimen Description URINE, CATHETERIZED  Final   Special Requests NONE  Final   Culture   Final    >=100,000 COLONIES/mL DIPHTHEROIDS(CORYNEBACTERIUM SPECIES) Standardized susceptibility testing for this organism is not available.    Report Status 10/10/2015 FINAL  Final  Culture, blood (Routine X 2) w Reflex to ID Panel     Status: None   Collection Time: 10/09/15  5:30 AM  Result Value Ref Range Status   Specimen Description BLOOD LEFT HAND  Final   Special Requests IN PEDIATRIC BOTTLE 1CC  Final   Culture NO GROWTH 5 DAYS  Final   Report Status 10/14/2015 FINAL  Final  Culture, blood (Routine X 2) w Reflex to ID Panel     Status: None   Collection Time: 10/09/15  5:38 AM  Result Value Ref Range Status   Specimen Description BLOOD LEFT ARM  Final   Special Requests IN PEDIATRIC BOTTLE [place]  Final   Culture NO GROWTH 5 DAYS  Final   Report Status 10/14/2015 FINAL  Final  MRSA PCR Screening     Status: None   Collection Time: 10/10/15 11:39 PM  Result Value Ref Range Status   MRSA by PCR NEGATIVE NEGATIVE Final    Comment:        The GeneXpert MRSA Assay (FDA approved for NASAL specimens only), is one component of a comprehensive MRSA colonization surveillance program. It is not intended to diagnose MRSA infection nor to guide or monitor treatment for MRSA infections.   CSF culture     Status: None   Collection Time: 10/11/15  1:33 PM  Result Value Ref Range Status   Specimen Description CSF  Final   Special Requests CSF NO 2  Final  Gram Stain   Final    CYTOSPIN SMEAR WBC PRESENT, PREDOMINANTLY PMN NO ORGANISMS SEEN    Culture NO GROWTH 3 DAYS  Final   Report Status 10/14/2015 FINAL  Final  Culture, respiratory (NON-Expectorated)     Status: None   Collection Time: 10/13/15  9:30 AM  Result Value Ref Range Status   Specimen Description ENDOTRACHEAL  Final   Special Requests NONE  Final    Gram Stain   Final    FEW WBC PRESENT,BOTH PMN AND MONONUCLEAR RARE SQUAMOUS EPITHELIAL CELLS PRESENT NO ORGANISMS SEEN Performed at [facility]    Culture   Final    FEW YEAST CONSISTENT WITH CANDIDA SPECIES Performed at [facility]    Report Status 10/16/2015 FINAL  Final    Imaging: Mr Jeri Cos [id number] Contrast  10/16/2015  CLINICAL DATA:  Seizure. EXAM: MRI HEAD WITHOUT AND WITH CONTRAST TECHNIQUE: Multiplanar, multiecho pulse sequences of the brain and surrounding structures were obtained without and with intravenous contrast. CONTRAST:  12mL MULTIHANCE GADOBENATE DIMEGLUMINE 529 MG/ML IV SOLN COMPARISON:  10/09/2015 FINDINGS: Calvarium and upper cervical spine: No focal marrow signal abnormality. Orbits: Negative. Sinuses and Mastoids: Layering nasopharyngeal fluid in the setting of intubation. Small bilateral mastoid effusions. Brain: There is restricted diffusion with comparatively mild FLAIR hyperintensity in the posterior and medial left thalamus which is newly seen. This area is nonenhancing and swollen. Given the clinical circumstances, seizure related phenomenon (literature describes these findings in ipsilateral pulvinar region in status epilepticus) is favored, but vasculitis or infectious encephalitis are differential considerations. Rapid development and lack of enhancement is inconsistent with neoplasm. The deep venous structures normally enhance. There are no cortical findings to explain the patient's seizures, including mass, gliosis, or cortical swelling (lateral left temporal lobe DWI signal on axial FLAIR sequence has no correlate swelling or T2 hyperintensity on thin section coronal T2 weighted imaging). FLAIR hyperintensity within sulci bilaterally is likely related to patient's ventilator status. Sulcal debris or hemorrhage should have been present on recent lumbar puncture. There is patchy FLAIR hyperintensity in the bilateral cerebral white matter and pons  which is stable from prior and compatible with chronic small vessel disease. Generalized atrophy. IMPRESSION: 1. New signal abnormality in the left thalamus as described above. This appearance could be seen with status epilepticus, infarct, encephalitis, or vasculitis. 2. No specific cortical finding to explain seizure. Electronically Signed   By: Monte Fantasia M.D.   On: 10/16/2015 13:41   Dg Chest Port 1 View  10/18/2015  CLINICAL DATA:  Evaluate endotracheal tube placement EXAM: PORTABLE CHEST 1 VIEW COMPARISON:  Portable chest x-ray of 10/17/2015 FINDINGS: The tip of the endotracheal tube is approximately 2.5 cm above the carina. The lungs appear well aerated. Right central venous line tip overlies the lower SVC. No pneumothorax is seen. The heart is within normal limits in size. IMPRESSION: 1. Tip of endotracheal tube 2.5 cm above the carina. 2. Right central venous line tip overlies the lower SVC. Electronically Signed   By: Ivar Drape M.D.   On: 10/18/2015 08:08   Dg Chest Port 1 View  10/17/2015  CLINICAL DATA:  Endotracheal tube placement. EXAM: PORTABLE CHEST 1 VIEW COMPARISON:  10/16/2015 FINDINGS: Patient slightly rotated to the right endotracheal tube tip is 1.8 cm above the carina. Nasogastric tube courses into the region of the stomach and off the inferior portion of the film as tip is not visualized. Interval placement of right-sided PICC line with tip overlying the  region of the SVC. Lungs are adequately inflated without focal consolidation or effusion. Cardiomediastinal silhouette and remainder of the exam is unchanged. IMPRESSION: No acute cardiopulmonary disease. Tubes and lines as described. Electronically Signed   By: Marin Olp M.D.   On: 10/17/2015 07:58   Dg Chest Port 1 View  10/16/2015  CLINICAL DATA:  Intubated patient, acute encephalopathy, acute respiratory failure. EXAM: PORTABLE CHEST 1 VIEW COMPARISON:  Portable chest x-ray of October 15, 2015. FINDINGS: The lungs  remain well-expanded. Minimal density at the lung bases has improved. The endotracheal tube tip lies 3.2 cm above the carina. The esophagogastric tube tip projects below the inferior margin of the image. The heart and pulmonary vascularity are normal. The mediastinum is normal in width. There is no pleural effusion or pneumothorax. The observed bony thorax is unremarkable. IMPRESSION: Minimal persistent basilar subsegmental atelectasis, nearly resolved. No evidence of pneumonia nor CHF. The support tubes are in reasonable position. Electronically Signed   By: David  Martinique M.D.   On: 10/16/2015 07:57   Dg Chest Port 1 View  10/15/2015  CLINICAL DATA:  Respiratory failure, cerebral vascular disease, intubated patient. EXAM: PORTABLE CHEST 1 VIEW COMPARISON:  Portable chest x-ray dated October 14, 2015. FINDINGS: The lungs are hyperinflated. There is minimal increased density in the left lateral costophrenic angle today consistent with worsening atelectasis or trace of pleural fluid. There is minimal blunting of the right lateral costophrenic angle which is also more conspicuous. The heart is normal in size. The pulmonary vascularity is not engorged. There is no pneumothorax or pneumomediastinum. The endotracheal tube tip lies 4 cm above the carina. The esophagogastric tube tip projects below the inferior margin of the image. IMPRESSION: Slight interval increased density at both lung bases may reflect very early subsegmental atelectasis. There is no alveolar pneumonia nor large pleural effusion. The support tubes are in reasonable position. Electronically Signed   By: David  Martinique M.D.   On: 10/15/2015 07:45    Assessment and plan:   Kathryn [name] is an 82 y.o. female patient who is admitted in ICU since 10/09/15, with prolonged status epilepticus arising from the left insular cortex, on long-term EEG monitoring. Her seizure medications have been adjusted yesterday, which resulted in the EEG showing improvement  compared to previous recording as left hemispheric periodic lateralized epileptiform discharges become less developed, less organized and occur last frequently particular during second half of the recording. There are no clinical or subclinical seizures present. This is certainly encouraging. Versed was discontinued this morning. Recommend slowly weaning off from the propofol for the next 4-5 hours.  We'll increase phenobarbital dose to 130 milligrams every 8 hours and continue with the other seizure medication doses unchanged. Dilantin total level is therapeutic.  We'll continue the long-term EEG monitoring. Neurology service will continue to follow up.  Please call for any further questions.

## 2015-10-18 NOTE — Progress Notes (Signed)
Wasted 20mg  of versed in sink. Witnessed by Weston Brass, RN and Claudie Revering, RN

## 2015-10-18 NOTE — Progress Notes (Addendum)
PULMONARY / CRITICAL CARE MEDICINE   Name: Kathryn [name] MRN: [id number] DOB: 09/13/1930    ADMISSION DATE:  10/08/2015 CONSULTATION DATE:  10/12/2015  REFERRING MD:  Dr. Janann Colonel, neurology  CHIEF COMPLAINT:  Seizures.  HISTORY OF PRESENT ILLNESS:   Hx from chart.  82 yo female brought to ER due to confusion after falling.  She was also having visual hallucinations.  She was noted to have seizure on 2/08 and neurology consulted.  She was also noted to have fever.  She had LP that was unrevealing.  She was started on abx for possible UTI, and acyclovir for possible encephalitis.  Her seizures persistent, and neuro recommend transfer to ICU for diprivan coma and intubation.  CULTURES: 2/07 Urine >> diphtheroids 2/07 Blood >>NGTD 2/09 CSF >>NGTD  ANTIBIOTICS: 2/06 Levaquin >>off 2/09 Acyclovir >> 2/11 2/10 Vanco>>off 2/10 ceftaz>> off 2/11 sputum>> candida  Tubes  2/10 ETT >>   STUDIES and events:  2/06 Admit 2/08 Noted to have seizure, neuro consulted 2/06 CT head >> atrophy 2/07 MRI brain >> remote basal ganglia and corona radiata infarcts b/l, remote Rt thalamus and posterior limb internal capsule infarcts, global atrophy 2/08 EEG >> lt temporal region focal seizures, lt hemisphere slowing 2/09 LP >> 140 RBC, 2 WBC, protein 38, glucose 51 2/10 Status epilepticus >> to ICU, diprivan coma  2./12 - Sedated on vent, szs overnight  2/13 - afebrile with ongoing c-eeg and diprivan 10/16/15 - c-EEG off. PEr tech - PLEDS +. c-versed gtt started by neuro per RN.  MRI - New signal abnormality in the left thalamus  2/15 EEG - PLEDS  SUBJECTIVE/OVERNIGHT/INTERVAL HX Remains  Unresponsive Afebrile Foley out, no UO recorded  VITAL SIGNS: BP 127/58 mmHg  Pulse 85  Temp(Src) 97.5 F (36.4 C) (Axillary)  Resp 20  Ht 5\' 3"  (1.6 m)  Wt 61.1 kg (134 lb 11.2 oz)  BMI 23.87 kg/m2  SpO2 100%  HEMODYNAMICS:    VENTILATOR SETTINGS: Vent Mode:  [-] CPAP;PSV FiO2 (%):  [40 %] 40  % Set Rate:  [10 bmp] 10 bmp Vt Set:  [450 mL] 450 mL PEEP:  [5 cmH20] 5 cmH20 Pressure Support:  [5 cmH20-8 cmH20] 5 cmH20 Plateau Pressure:  [13 cmH20-16 cmH20] 13 cmH20  INTAKE / OUTPUT: I/O last 3 completed shifts: In: [id number] [P.O.:900; I.V.:2028.4; ZH:6304008; IV Piggyback:1006.6] Out: -   PHYSICAL EXAMINATION: General: chr  Ill appearing Neuro:  Occ moves all 4s and withdraws to pain but not awake HEENT: No JVD Cardiovascular:  Regular, no murmur Lungs:  B/l rhonchi Abdomen:  Soft, nontender Musculoskeletal:  No edema Skin:  No rashes  LABS:  PULMONARY  Recent Labs Lab 10/12/15 2013 10/13/15 0019  PHART 7.520* 7.494*  PCO2ART 27.8* 32.0*  PO2ART 543* 176.0*  HCO3 22.5 24.6*  TCO2 23.4 26  O2SAT 100.0 100.0    CBC  Recent Labs Lab 10/16/15 0831 10/17/15 0500 10/18/15 0425  HGB 10.5* 9.6* 8.9*  HCT 32.5* 28.2* 27.9*  WBC 7.1 5.9 4.4  PLT 231 204 230    COAGULATION No results for input(s): INR in the last 168 hours.  CARDIAC  No results for input(s): TROPONINI in the last 168 hours. No results for input(s): PROBNP in the last 168 hours.   CHEMISTRY  Recent Labs Lab 10/14/15 0413 10/14/15 1943 10/15/15 0421 10/16/15 0831 10/17/15 0500 10/18/15 0425  NA 138 138 137 137  --  139  K 2.8* 4.1 3.6 3.2*  --  2.9*  CL 106  104 102 97*  --  103  CO2 23 24 26 28   --  28  GLUCOSE 117* 155* 128* 122*  --  122*  BUN 18 16 15 19   --  21*  CREATININE 0.74 0.65 0.57 0.57  --  0.48  CALCIUM 8.5* 8.8* 8.5* 9.3  --  8.9  MG 2.3  --  1.7 1.8 1.7 1.8  PHOS 1.9*  --  2.9 2.7 2.6 2.5   Estimated Creatinine Clearance: 44.1 mL/min (by C-G formula based on Cr of 0.48).   LIVER  Recent Labs Lab 10/12/15 0400 10/12/15 1924 10/17/15 0500  AST 21 26  --   ALT 10* 12*  --   ALKPHOS 40 43  --   BILITOT 0.8 1.1  --   PROT 5.8* 6.2*  --   ALBUMIN 3.0* 3.2* 2.4*     INFECTIOUS No results for input(s): LATICACIDVEN, PROCALCITON in the last 168  hours.   ENDOCRINE CBG (last 3)   Recent Labs  10/17/15 2332 10/18/15 0337 10/18/15 0749  GLUCAP 102* 102* 93         IMAGING x48h  - image(s) personally visualized  -   highlighted in bold Mr Kizzie Fantasia Contrast  10/16/2015  CLINICAL DATA:  Seizure. EXAM: MRI HEAD WITHOUT AND WITH CONTRAST TECHNIQUE: Multiplanar, multiecho pulse sequences of the brain and surrounding structures were obtained without and with intravenous contrast. CONTRAST:  90mL MULTIHANCE GADOBENATE DIMEGLUMINE 529 MG/ML IV SOLN COMPARISON:  10/09/2015 FINDINGS: Calvarium and upper cervical spine: No focal marrow signal abnormality. Orbits: Negative. Sinuses and Mastoids: Layering nasopharyngeal fluid in the setting of intubation. Small bilateral mastoid effusions. Brain: There is restricted diffusion with comparatively mild FLAIR hyperintensity in the posterior and medial left thalamus which is newly seen. This area is nonenhancing and swollen. Given the clinical circumstances, seizure related phenomenon (literature describes these findings in ipsilateral pulvinar region in status epilepticus) is favored, but vasculitis or infectious encephalitis are differential considerations. Rapid development and lack of enhancement is inconsistent with neoplasm. The deep venous structures normally enhance. There are no cortical findings to explain the patient's seizures, including mass, gliosis, or cortical swelling (lateral left temporal lobe DWI signal on axial FLAIR sequence has no correlate swelling or T2 hyperintensity on thin section coronal T2 weighted imaging). FLAIR hyperintensity within sulci bilaterally is likely related to patient's ventilator status. Sulcal debris or hemorrhage should have been present on recent lumbar puncture. There is patchy FLAIR hyperintensity in the bilateral cerebral white matter and pons which is stable from prior and compatible with chronic small vessel disease. Generalized atrophy. IMPRESSION: 1.  New signal abnormality in the left thalamus as described above. This appearance could be seen with status epilepticus, infarct, encephalitis, or vasculitis. 2. No specific cortical finding to explain seizure. Electronically Signed   By: Monte Fantasia M.D.   On: 10/16/2015 13:41   Dg Chest Port 1 View  10/18/2015  CLINICAL DATA:  Evaluate endotracheal tube placement EXAM: PORTABLE CHEST 1 VIEW COMPARISON:  Portable chest x-ray of 10/17/2015 FINDINGS: The tip of the endotracheal tube is approximately 2.5 cm above the carina. The lungs appear well aerated. Right central venous line tip overlies the lower SVC. No pneumothorax is seen. The heart is within normal limits in size. IMPRESSION: 1. Tip of endotracheal tube 2.5 cm above the carina. 2. Right central venous line tip overlies the lower SVC. Electronically Signed   By: Ivar Drape M.D.   On: 10/18/2015 08:08   Dg  Chest Port 1 View  10/17/2015  CLINICAL DATA:  Endotracheal tube placement. EXAM: PORTABLE CHEST 1 VIEW COMPARISON:  10/16/2015 FINDINGS: Patient slightly rotated to the right endotracheal tube tip is 1.8 cm above the carina. Nasogastric tube courses into the region of the stomach and off the inferior portion of the film as tip is not visualized. Interval placement of right-sided PICC line with tip overlying the region of the SVC. Lungs are adequately inflated without focal consolidation or effusion. Cardiomediastinal silhouette and remainder of the exam is unchanged. IMPRESSION: No acute cardiopulmonary disease. Tubes and lines as described. Electronically Signed   By: Marin Olp M.D.   On: 10/17/2015 07:58       DISCUSSION: 82 yo female with acute encephalopathy and status epilepticus.  She has hx of CVA's and polypharmacy.  She required intubation for diprivan coma to control seizures. 2/12 recurrent szs with diprivan dosed decreased. Dilantin added 2/12.  ASSESSMENT / PLAN:  PULMONARY A: Acute respiratory failure in setting of  status epilepticus.  P:   Full vent support Hold SBTs until seizure resolved  CARDIOVASCULAR A:  Hx of HTN, HLD. P:  Continue zocor Monitor hemodynamics  RENAL  A:    Hypomag    - replete  P:   Replace electrolytes as needed  GASTROINTESTINAL A:   Nutrition. Chronic abdominal pain. P:   Tube feeds while on vent Protonix for SUP  HEMATOLOGIC A:   No acute issues. P:  F/u CBC Lovenox for DVT prevention  INFECTIOUS  A:   ?UTI. ?Encephalitis. P:   Monitor off abx  ENDOCRINE A:   No acute issues. P:   Monitor CBGs  NEUROLOGIC A:   Acute encephalopathy 2nd to status epilepticus. Hx of anxiety, depression on chronic benzo's. Hx of CVA. Breakthrough szs when diprivan decreased   - back on c-EEG and with ongoing status  P:   AEDs per neurology - dilantin, topamax, keppra, vimpat Propofol gtt No WUA until seizures stopped - neuro to direct  GLOBAL 2/16 updated d in law Per neuro to see how course evolves next 1-3 days and decide on goals of care  The patient is critically ill with multiple organ systems failure and requires high complexity decision making for assessment and support, frequent evaluation and titration of therapies, application of advanced monitoring technologies and extensive interpretation of multiple databases. Critical Care Time devoted to patient care services described in this note independent of APP time is 35 minutes.   Kara Mead MD. Shade Flood. [facility] Pager [phone] If no response call [phone]    10/18/2015 8:22 AM

## 2015-10-19 LAB — CBC WITH DIFFERENTIAL/PLATELET
Basophils Absolute: 0.1 10*3/uL (ref 0.0–0.1)
Basophils Relative: 1 %
EOS ABS: 0.5 10*3/uL (ref 0.0–0.7)
EOS PCT: 7 %
HCT: 31.8 % — ABNORMAL LOW (ref 36.0–46.0)
HEMOGLOBIN: 9.9 g/dL — AB (ref 12.0–15.0)
LYMPHS ABS: 1.5 10*3/uL (ref 0.7–4.0)
LYMPHS PCT: 20 %
MCH: 29.6 pg (ref 26.0–34.0)
MCHC: 31.1 g/dL (ref 30.0–36.0)
MCV: 94.9 fL (ref 78.0–100.0)
MONOS PCT: 14 %
Monocytes Absolute: 1.1 10*3/uL — ABNORMAL HIGH (ref 0.1–1.0)
Neutro Abs: 4.4 10*3/uL (ref 1.7–7.7)
Neutrophils Relative %: 59 %
Platelets: 334 10*3/uL (ref 150–400)
RBC: 3.35 MIL/uL — AB (ref 3.87–5.11)
RDW: 13.5 % (ref 11.5–15.5)
WBC: 7.5 10*3/uL (ref 4.0–10.5)

## 2015-10-19 LAB — BASIC METABOLIC PANEL
ANION GAP: 8 (ref 5–15)
BUN: 20 mg/dL (ref 6–20)
CO2: 25 mmol/L (ref 22–32)
Calcium: 9.5 mg/dL (ref 8.9–10.3)
Chloride: 105 mmol/L (ref 101–111)
Creatinine, Ser: 0.54 mg/dL (ref 0.44–1.00)
GFR calc Af Amer: >60 mL/min (ref >60)
GFR calc non Af Amer: >60 mL/min (ref >60)
GLUCOSE: 122 mg/dL — AB (ref 65–99)
POTASSIUM: 3.6 mmol/L (ref 3.5–5.1)
Sodium: 138 mmol/L (ref 135–145)

## 2015-10-19 LAB — GLUCOSE, CAPILLARY
GLUCOSE-CAPILLARY: 115 mg/dL — AB (ref 65–99)
GLUCOSE-CAPILLARY: 110 mg/dL — AB (ref 65–99)
Glucose-Capillary: 101 mg/dL — ABNORMAL HIGH (ref 65–99)
Glucose-Capillary: 111 mg/dL — ABNORMAL HIGH (ref 65–99)
Glucose-Capillary: 110 mg/dL — ABNORMAL HIGH (ref 65–99)

## 2015-10-19 LAB — PHOSPHORUS: Phosphorus: 2.7 mg/dL (ref 2.5–4.6)

## 2015-10-19 LAB — MAGNESIUM: Magnesium: 1.6 mg/dL — ABNORMAL LOW (ref 1.7–2.4)

## 2015-10-19 MED ORDER — PREGABALIN 50 MG PO CAPS
50.0000 mg | ORAL_CAPSULE | Freq: Three times a day (TID) | ORAL | Status: DC
  Administered 2015-10-19 – 2015-10-22 (×10): 50 mg via ORAL
  Filled 2015-10-19 (×10): qty 1

## 2015-10-19 MED ORDER — LEVETIRACETAM 500 MG/5ML IV SOLN
1750.0000 mg | Freq: Two times a day (BID) | INTRAVENOUS | Status: DC
  Administered 2015-10-19 – 2015-10-21 (×4): 1750 mg via INTRAVENOUS
  Filled 2015-10-19 (×5): qty 17.5

## 2015-10-19 MED ORDER — PHENOBARBITAL SODIUM 130 MG/ML IJ SOLN
100.0000 mg | Freq: Three times a day (TID) | INTRAMUSCULAR | Status: DC
  Administered 2015-10-19 – 2015-10-20 (×4): 100 mg via INTRAVENOUS
  Filled 2015-10-19 (×3): qty 1

## 2015-10-19 MED ORDER — LACOSAMIDE 200 MG/20ML IV SOLN
150.0000 mg | Freq: Two times a day (BID) | INTRAVENOUS | Status: DC
  Administered 2015-10-19 – 2015-10-21 (×4): 150 mg via INTRAVENOUS
  Filled 2015-10-19 (×8): qty 15

## 2015-10-19 MED ORDER — PHENYTOIN SODIUM 50 MG/ML IJ SOLN
50.0000 mg | Freq: Three times a day (TID) | INTRAMUSCULAR | Status: DC
  Administered 2015-10-19 – 2015-10-20 (×5): 50 mg via INTRAVENOUS
  Filled 2015-10-19 (×5): qty 2

## 2015-10-19 MED ORDER — LORAZEPAM 2 MG/ML IJ SOLN: 1.0000 mg | INTRAMUSCULAR | Status: DC | PRN

## 2015-10-19 NOTE — Clinical Social Work Note (Signed)
Clinical Social Worker continuing to follow patient and family for support and discharge planning needs. Patient remains on the ventilator at this time. CSW remains available for support and to facilitate patient discharge needs once medically stable.  Barbette Or, [place]

## 2015-10-19 NOTE — Progress Notes (Signed)
Interval history  :                                                                                                                                       Kathryn [name] is an 82 y.o. female  with intractable left hemispheric electrographic seizures. She is on several seizure medications as listed below. With the change in the medication regimen yesterday, there has been continued  improvement in the LTM EEG with  resolution of the electrographic seizures. The left sided abnormal discharges have been less frequent as well compared to prior EEGs. Overall this is a significant improvement.   Past medical history: Past Medical History  Diagnosis Date  . Depression   . Hypertension   . High cholesterol   . Rectal prolapse   . Hx of adenomatous colonic polyps   . Internal hemorrhoids   . Hypothyroidism   . Fecal incontinence   . TIA (transient ischemic attack)   . History of frequent urinary tract infections     recent  . Migraine   . Fatty liver 03/20/13  . Generalized ischemic cerebrovascular disease 10/09/2015  . Stroke [employer] [facility]) Sept 1, 2010; Sept 12, 2011    Past Surgical History  Procedure Laterality Date  . Colon surgery  2010, for prolapsed organs after hystecrtomy surgery  . Appendectomy  2008  . Cholecystectomy    . Lumbar laminectomy/decompression microdiscectomy N/A 01/07/2013    Procedure: LUMBAR LAMINECTOMY CENTRAL DECOMPRESSION L4-L5, BILATERAL FORAMENOTOMY L4,L5    ;  Surgeon: Tobi Bastos, MD;  Location: [facility];  Service: Orthopedics;  Laterality: N/A;  . Back surgery    . Hemorrhoid surgery  09/2008    Archie Endo 12/31/2010  . Abdominal hysterectomy  05/2007    Archie Endo 01/02/2011    Family History: Family History  Problem Relation Age of Onset  . Stroke Father   . Migraines Father   . CVA Father   . Heart attack Father   . Hypertension Maternal Aunt     x3  . Colon cancer Neg Hx   . CVA Mother     Social History:   reports that she has never smoked. She has never used  smokeless tobacco. She reports that she does not drink alcohol or use illicit drugs.  Allergies:  Allergies  Allergen Reactions  . Augmentin [Amoxicillin-Pot Clavulanate] Swelling and Diarrhea    Throat   . Citalopram Swelling    Eyes ears and throat swelling Throat and eyes   . Codeine Anaphylaxis and Shortness Of Breath  . Fish-Derived Products Anaphylaxis  . Hyoscyamine Anaphylaxis and Swelling  . Ibuprofen Swelling    Throat and eyes   . Latex Swelling and Rash    Swelling to troat  . Lipitor [Atorvastatin] Swelling    Muscle aches throat  . Septra [Bactrim] Anaphylaxis  . Shellfish Allergy Anaphylaxis  . Miconazole Rash  . Keflet [Cephalexin] Diarrhea  Upset stomach  . Meloxicam Diarrhea and Nausea And Vomiting  . Sulfa Antibiotics     Rash and also throat was tight  . Verapamil     Tachycardia and flushing  . Vicodin [Hydrocodone-Acetaminophen] Other (See Comments)    stroke  . Zoloft [Sertraline Hcl] Swelling    Red spots all over face, swelling of tongue and legs.   . Ciprofloxacin Nausea And Vomiting    Other reaction(s): Abdominal Pain  . Depakene [Valproate Sodium] Hives    All over body   . Isoptin Sr [Verapamil Hcl Er] Cough  . Lisinopril Cough    Fatigue and cough  . Telmisartan-Hctz Cough    Medications:   Current facility-administered medications:  .  Place/Maintain arterial line, , , Until Discontinued **AND** 0.9 %  sodium chloride infusion, , Intra-arterial, PRN, Javier Glazier, MD, Stopped at 10/14/15 1151 .  acetaminophen (TYLENOL) solution 650 mg, 650 mg, Oral, Q6H PRN, Chesley Mires, MD .  antiseptic oral rinse solution (CORINZ), 7 mL, Mouth Rinse, 10 times per day, Chesley Mires, MD, 7 mL at 10/19/15 1652 .  bisacodyl (DULCOLAX) suppository 10 mg, 10 mg, Rectal, Daily PRN, Chesley Mires, MD .  chlorhexidine gluconate (PERIDEX) 0.12 % solution 15 mL, 15 mL, Mouth Rinse, BID, Chesley Mires, MD, 15 mL at 10/19/15 0825 .  enoxaparin (LOVENOX)  injection 40 mg, 40 mg, Subcutaneous, Q24H, Raelyn Ensign Sumner, DO, 40 mg at 10/19/15 1017 .  feeding supplement (VITAL AF 1.2 CAL) liquid 1,000 mL, 1,000 mL, Per Tube, Continuous, Brand Males, MD, Last Rate: 50 mL/hr at 10/17/15 1900, 1,000 mL at 10/17/15 1900 .  fentaNYL (SUBLIMAZE) injection 50 mcg, 50 mcg, Intravenous, Q2H PRN, Chesley Mires, MD, 50 mcg at 10/16/15 [id number] .  hydrALAZINE (APRESOLINE) injection 5 mg, 5 mg, Intravenous, Q2H PRN, Ivor Costa, MD, 5 mg at 10/15/15 1422 .  insulin aspart (novoLOG) injection 0-15 Units, 0-15 Units, Subcutaneous, 6 times per day, Chesley Mires, MD, 2 Units at 10/16/15 0427 .  lacosamide (VIMPAT) 200 mg in sodium chloride 0.9 % 25 mL IVPB, 200 mg, Intravenous, Q12H, Aniken Monestime Fuller Mandril, MD, 200 mg at 10/19/15 1130 .  levETIRAcetam (KEPPRA) 2,000 mg in sodium chloride 0.9 % 100 mL IVPB, 2,000 mg, Intravenous, Q12H, Prynce Jacober Fuller Mandril, MD, 2,000 mg at 10/19/15 1302 .  LORazepam (ATIVAN) injection 1-2 mg, 1-2 mg, Intravenous, Q4H PRN, Kara Mead V, MD .  nystatin-triamcinolone (MYCOLOG II) cream 1 application, 1 application, Topical, BID, Lauren D Bajbus, RPH, 1 application at [phone] .  [DISCONTINUED] ondansetron (ZOFRAN) tablet 4 mg, 4 mg, Oral, Q6H PRN **OR** ondansetron (ZOFRAN) injection 4 mg, 4 mg, Intravenous, Q6H PRN, Ivor Costa, MD .  pantoprazole sodium (PROTONIX) 40 mg/20 mL oral suspension 40 mg, 40 mg, Per Tube, Q24H, Chesley Mires, MD, 40 mg at 10/18/15 2003 .  PHENObarbital (LUMINAL) injection 130 mg, 130 mg, Intravenous, TID, Garyn Arlotta Fuller Mandril, MD, 130 mg at 10/19/15 1652 .  phenytoin (DILANTIN) injection 50 mg, 50 mg, Intravenous, Q8H, Lamonta Cypress Fuller Mandril, MD, 50 mg at 10/19/15 1302 .  pregabalin (LYRICA) capsule 75 mg, 75 mg, Oral, TID, Dashawn Bartnick Fuller Mandril, MD, 75 mg at 10/19/15 1652 .  sennosides (SENOKOT) 8.8 MG/5ML syrup 5 mL, 5 mL, Per Tube, BID PRN, Chesley Mires, MD, 5 mL at 10/15/15 0923 .   simvastatin (ZOCOR) tablet 40 mg, 40 mg, Per Tube, q morning - 10a, Chesley Mires, MD, 40 mg at 10/19/15 1017 .  sodium chloride  flush (NS) 0.9 % injection 10-40 mL, 10-40 mL, Intracatheter, Q12H, Raylene Miyamoto, MD, 10 mL at 10/18/15 2247 .  sodium chloride flush (NS) 0.9 % injection 10-40 mL, 10-40 mL, Intracatheter, PRN, Raylene Miyamoto, MD .  sodium chloride flush (NS) 0.9 % injection 3 mL, 3 mL, Intravenous, Q12H, Ivor Costa, MD, 3 mL at 10/18/15 2248 .  topiramate (TOPAMAX) tablet 100 mg, 100 mg, Per Tube, BID, Wynell Balloon, RPH, 100 mg at 10/19/15 1018 .  vitamin B-12 (CYANOCOBALAMIN) tablet 1,000 mcg, 1,000 mcg, Per Tube, Daily, Chesley Mires, MD, 1,000 mcg at 10/19/15 1017 .  vitamin C (ASCORBIC ACID) tablet 500 mg, 500 mg, Per Tube, Daily, Chesley Mires, MD, 500 mg at 10/19/15 1017 .  vitamin e oil OIL 200 Units, 200 Units, Per Tube, Daily, Chesley Mires, MD, 200 Units at 10/19/15 1017   Neurologic Examination:                                                                                                      Blood pressure 157/72, pulse 86, temperature 98.3 F (36.8 C), temperature source Axillary, resp. rate 21, height 5\' 3"  (1.6 m), weight 57.8 kg (127 lb 6.8 oz), SpO2 100 %.  Patient intubated, sedated. No gaze deviation. Withdraws to stimulus in all 4 extremities.    Lab Results: Basic Metabolic Panel:  Recent Labs Lab 10/14/15 1943 10/15/15 0421 10/16/15 0831 10/17/15 0500 10/18/15 0425 10/19/15 0600  NA 138 137 137  --  139 138  K 4.1 3.6 3.2*  --  2.9* 3.6  CL 104 102 97*  --  103 105  CO2 24 26 28   --  28 25  GLUCOSE 155* 128* 122*  --  122* 122*  BUN 16 15 19   --  21* 20  CREATININE 0.65 0.57 0.57  --  0.48 0.54  CALCIUM 8.8* 8.5* 9.3  --  8.9 9.5  MG  --  1.7 1.8 1.7 1.8 1.6*  PHOS  --  2.9 2.7 2.6 2.5 2.7    Liver Function Tests:  Recent Labs Lab 10/12/15 1924 10/17/15 0500  AST 26  --   ALT 12*  --   ALKPHOS 43  --   BILITOT 1.1  --   PROT  6.2*  --   ALBUMIN 3.2* 2.4*   No results for input(s): LIPASE, AMYLASE in the last 168 hours. No results for input(s): AMMONIA in the last 168 hours.  CBC:  Recent Labs Lab 10/15/15 0421 10/16/15 0831 10/17/15 0500 10/18/15 0425 10/19/15 0600  WBC 6.1 7.1 5.9 4.4 7.5  NEUTROABS  --  5.1 3.7 2.1 4.4  HGB 9.9* 10.5* 9.6* 8.9* 9.9*  HCT 29.8* 32.5* 28.2* 27.9* 31.8*  MCV 90.3 92.3 94.0 94.3 94.9  PLT 192 231 204 230 334    Cardiac Enzymes: No results for input(s): CKTOTAL, CKMB, CKMBINDEX, TROPONINI in the last 168 hours.  Lipid Panel:  Recent Labs Lab 10/12/15 1924 10/15/15 1823 10/16/15 1411 10/18/15 1815  TRIG 46 59 69 99    CBG:  Recent Labs Lab  10/18/15 2318 10/19/15 0350 10/19/15 0756 10/19/15 1128 10/19/15 1516  GLUCAP 104* 101* 111* 115* 110*    Microbiology: Results for orders placed or performed during the hospital encounter of 10/08/15  Urine culture     Status: None   Collection Time: 10/09/15  1:43 AM  Result Value Ref Range Status   Specimen Description URINE, CATHETERIZED  Final   Special Requests NONE  Final   Culture   Final    >=100,000 COLONIES/mL DIPHTHEROIDS(CORYNEBACTERIUM SPECIES) Standardized susceptibility testing for this organism is not available.    Report Status 10/10/2015 FINAL  Final  Culture, blood (Routine X 2) w Reflex to ID Panel     Status: None   Collection Time: 10/09/15  5:30 AM  Result Value Ref Range Status   Specimen Description BLOOD LEFT HAND  Final   Special Requests IN PEDIATRIC BOTTLE 1CC  Final   Culture NO GROWTH 5 DAYS  Final   Report Status 10/14/2015 FINAL  Final  Culture, blood (Routine X 2) w Reflex to ID Panel     Status: None   Collection Time: 10/09/15  5:38 AM  Result Value Ref Range Status   Specimen Description BLOOD LEFT ARM  Final   Special Requests IN PEDIATRIC BOTTLE [place]  Final   Culture NO GROWTH 5 DAYS  Final   Report Status 10/14/2015 FINAL  Final  MRSA PCR Screening     Status:  None   Collection Time: 10/10/15 11:39 PM  Result Value Ref Range Status   MRSA by PCR NEGATIVE NEGATIVE Final    Comment:        The GeneXpert MRSA Assay (FDA approved for NASAL specimens only), is one component of a comprehensive MRSA colonization surveillance program. It is not intended to diagnose MRSA infection nor to guide or monitor treatment for MRSA infections.   CSF culture     Status: None   Collection Time: 10/11/15  1:33 PM  Result Value Ref Range Status   Specimen Description CSF  Final   Special Requests CSF NO 2  Final   Gram Stain   Final    CYTOSPIN SMEAR WBC PRESENT, PREDOMINANTLY PMN NO ORGANISMS SEEN    Culture NO GROWTH 3 DAYS  Final   Report Status 10/14/2015 FINAL  Final  Culture, respiratory (NON-Expectorated)     Status: None   Collection Time: 10/13/15  9:30 AM  Result Value Ref Range Status   Specimen Description ENDOTRACHEAL  Final   Special Requests NONE  Final   Gram Stain   Final    FEW WBC PRESENT,BOTH PMN AND MONONUCLEAR RARE SQUAMOUS EPITHELIAL CELLS PRESENT NO ORGANISMS SEEN Performed at [facility]    Culture   Final    FEW YEAST CONSISTENT WITH CANDIDA SPECIES Performed at [facility]    Report Status 10/16/2015 FINAL  Final     Imaging: Mr Jeri Cos [id number] Contrast  10/16/2015  CLINICAL DATA:  Seizure. EXAM: MRI HEAD WITHOUT AND WITH CONTRAST TECHNIQUE: Multiplanar, multiecho pulse sequences of the brain and surrounding structures were obtained without and with intravenous contrast. CONTRAST:  72mL MULTIHANCE GADOBENATE DIMEGLUMINE 529 MG/ML IV SOLN COMPARISON:  10/09/2015 FINDINGS: Calvarium and upper cervical spine: No focal marrow signal abnormality. Orbits: Negative. Sinuses and Mastoids: Layering nasopharyngeal fluid in the setting of intubation. Small bilateral mastoid effusions. Brain: There is restricted diffusion with comparatively mild FLAIR hyperintensity in the posterior and medial left thalamus which is  newly seen. This area is nonenhancing and  swollen. Given the clinical circumstances, seizure related phenomenon (literature describes these findings in ipsilateral pulvinar region in status epilepticus) is favored, but vasculitis or infectious encephalitis are differential considerations. Rapid development and lack of enhancement is inconsistent with neoplasm. The deep venous structures normally enhance. There are no cortical findings to explain the patient's seizures, including mass, gliosis, or cortical swelling (lateral left temporal lobe DWI signal on axial FLAIR sequence has no correlate swelling or T2 hyperintensity on thin section coronal T2 weighted imaging). FLAIR hyperintensity within sulci bilaterally is likely related to patient's ventilator status. Sulcal debris or hemorrhage should have been present on recent lumbar puncture. There is patchy FLAIR hyperintensity in the bilateral cerebral white matter and pons which is stable from prior and compatible with chronic small vessel disease. Generalized atrophy. IMPRESSION: 1. New signal abnormality in the left thalamus as described above. This appearance could be seen with status epilepticus, infarct, encephalitis, or vasculitis. 2. No specific cortical finding to explain seizure. Electronically Signed   By: Monte Fantasia M.D.   On: 10/16/2015 13:41   Dg Chest Port 1 View  10/18/2015  CLINICAL DATA:  Evaluate endotracheal tube placement EXAM: PORTABLE CHEST 1 VIEW COMPARISON:  Portable chest x-ray of 10/17/2015 FINDINGS: The tip of the endotracheal tube is approximately 2.5 cm above the carina. The lungs appear well aerated. Right central venous line tip overlies the lower SVC. No pneumothorax is seen. The heart is within normal limits in size. IMPRESSION: 1. Tip of endotracheal tube 2.5 cm above the carina. 2. Right central venous line tip overlies the lower SVC. Electronically Signed   By: Ivar Drape M.D.   On: 10/18/2015 08:08   Dg Chest Port 1  View  10/17/2015  CLINICAL DATA:  Endotracheal tube placement. EXAM: PORTABLE CHEST 1 VIEW COMPARISON:  10/16/2015 FINDINGS: Patient slightly rotated to the right endotracheal tube tip is 1.8 cm above the carina. Nasogastric tube courses into the region of the stomach and off the inferior portion of the film as tip is not visualized. Interval placement of right-sided PICC line with tip overlying the region of the SVC. Lungs are adequately inflated without focal consolidation or effusion. Cardiomediastinal silhouette and remainder of the exam is unchanged. IMPRESSION: No acute cardiopulmonary disease. Tubes and lines as described. Electronically Signed   By: Marin Olp M.D.   On: 10/17/2015 07:58   Dg Chest Port 1 View  10/16/2015  CLINICAL DATA:  Intubated patient, acute encephalopathy, acute respiratory failure. EXAM: PORTABLE CHEST 1 VIEW COMPARISON:  Portable chest x-ray of October 15, 2015. FINDINGS: The lungs remain well-expanded. Minimal density at the lung bases has improved. The endotracheal tube tip lies 3.2 cm above the carina. The esophagogastric tube tip projects below the inferior margin of the image. The heart and pulmonary vascularity are normal. The mediastinum is normal in width. There is no pleural effusion or pneumothorax. The observed bony thorax is unremarkable. IMPRESSION: Minimal persistent basilar subsegmental atelectasis, nearly resolved. No evidence of pneumonia nor CHF. The support tubes are in reasonable position. Electronically Signed   By: David  Martinique M.D.   On: 10/16/2015 07:57     Assessment and plan:   DAHLIA DELASHMIT is an 82 y.o. female patient  with left hemispheric nonconvulsive electrographic seizures , in intractable status epilepticus. MRI of the brain showed restricted diffusion involving the left insular cortex and left thalamus which is likely secondary to the intractable status epilepticus from the left insular cortical area. Clinically patient is intubated,  sedated.   Improved EEG. Off Versed and propofol. Dilantin dose reduced to 50 mg three times a day due to low albumin and corrected Dilantin level is supra therapeutic. As the EEG has improved, to reduce the degree of sedation to be able to work towards extubation, recommend reducing the dose of seizure medications as follows, we'll reduce Keppra to 1750 twice a day, Vimpat 150 twice a day, phenobarbital 100 mg 3 times a day, Lyrica 50 mg 3 times a day. Continue the long-term EEG monitoring. Check Dilantin  trough levels tomorrow morning.  Neurology service will continue to follow up.  Please call for any further questions.

## 2015-10-19 NOTE — Progress Notes (Signed)
PULMONARY / CRITICAL CARE MEDICINE   Name: Kathryn [name] MRN: [id number] DOB: [birth year]    ADMISSION DATE:  10/08/2015 CONSULTATION DATE:  10/12/2015  REFERRING MD:  Dr. Janann Colonel, neurology  CHIEF COMPLAINT:  Seizures.  HISTORY OF PRESENT ILLNESS:   Hx from chart.  82 yo female brought to ER due to confusion after falling.  She was also having visual hallucinations.  She was noted to have seizure on 2/08 and neurology consulted.  She was also noted to have fever.  She had LP that was unrevealing.  She was started on abx for possible UTI, and acyclovir for possible encephalitis.  Her seizures persistent, and she was transferred to ICU for diprivan coma and intubation.  CULTURES: 2/07 Urine >> diphtheroids 2/07 Blood >>NGTD 2/09 CSF >>NGTD  ANTIBIOTICS: 2/06 Levaquin >>off 2/09 Acyclovir >> 2/11 2/10 Vanco>>off 2/10 ceftaz>> off 2/11 sputum>> candida  Tubes  2/10 ETT >>   STUDIES and events:  2/06 Admit 2/08 Noted to have seizure, neuro consulted 2/06 CT head >> atrophy 2/07 MRI brain >> remote basal ganglia and corona radiata infarcts b/l, remote Rt thalamus and posterior limb internal capsule infarcts, global atrophy 2/08 EEG >> lt temporal region focal seizures, lt hemisphere slowing 2/09 LP >> 140 RBC, 2 WBC, protein 38, glucose 51 2/10 Status epilepticus >> to ICU, diprivan coma  2./12 - Sedated on vent, szs overnight  2/13 - afebrile with ongoing c-eeg and diprivan 10/16/15 - c-EEG off. PEr tech - PLEDS +. c-versed gtt started by neuro per RN.  MRI - New signal abnormality in the left thalamus  2/15 EEG - PLEDS 2/17 - EEG - PLEDS improved  SUBJECTIVE/OVERNIGHT/INTERVAL HX On c EEG Remains critically ill &   Unresponsive Afebrile Good UO  VITAL SIGNS: BP 172/78 mmHg  Pulse 101  Temp(Src) 99.1 F (37.3 C) (Axillary)  Resp 20  Ht 5\' 3"  (1.6 m)  Wt 127 lb 6.8 oz (57.8 kg)  BMI 22.58 kg/m2  SpO2 99%  HEMODYNAMICS:    VENTILATOR SETTINGS: Vent Mode:  [-]  PRVC FiO2 (%):  [40 %] 40 % Set Rate:  [10 bmp] 10 bmp Vt Set:  [450 mL] 450 mL PEEP:  [5 cmH20] 5 cmH20 Pressure Support:  [8 cmH20] 8 cmH20 Plateau Pressure:  [14 cmH20] 14 cmH20  INTAKE / OUTPUT: I/O last 3 completed shifts: In: 4163 [I.V.:757.4; NG/GT:2250; IV Piggyback:1155.6] Out: 2245 [Urine:2245]  PHYSICAL EXAMINATION: General: chr  Ill appearing Neuro:  Comatose, no response to DPS HEENT: No JVD Cardiovascular:  Regular, no murmur Lungs:  B/l rhonchi Abdomen:  Soft, nontender Musculoskeletal:  No edema Skin:  No rashes  LABS:  PULMONARY  Recent Labs Lab 10/12/15 2013 10/13/15 0019  PHART 7.520* 7.494*  PCO2ART 27.8* 32.0*  PO2ART 543* 176.0*  HCO3 22.5 24.6*  TCO2 23.4 26  O2SAT 100.0 100.0    CBC  Recent Labs Lab 10/17/15 0500 10/18/15 0425 10/19/15 0600  HGB 9.6* 8.9* 9.9*  HCT 28.2* 27.9* 31.8*  WBC 5.9 4.4 7.5  PLT 204 230 334    COAGULATION No results for input(s): INR in the last 168 hours.  CARDIAC  No results for input(s): TROPONINI in the last 168 hours. No results for input(s): PROBNP in the last 168 hours.   CHEMISTRY  Recent Labs Lab 10/14/15 1943 10/15/15 0421 10/16/15 0831 10/17/15 0500 10/18/15 0425 10/19/15 0600  NA 138 137 137  --  139 138  K 4.1 3.6 3.2*  --  2.9* 3.6  CL  104 102 97*  --  103 105  CO2 24 26 28   --  28 25  GLUCOSE 155* 128* 122*  --  122* 122*  BUN 16 15 19   --  21* 20  CREATININE 0.65 0.57 0.57  --  0.48 0.54  CALCIUM 8.8* 8.5* 9.3  --  8.9 9.5  MG  --  1.7 1.8 1.7 1.8 1.6*  PHOS  --  2.9 2.7 2.6 2.5 2.7   Estimated Creatinine Clearance: 44.1 mL/min (by C-G formula based on Cr of 0.54).   LIVER  Recent Labs Lab 10/12/15 1924 10/17/15 0500  AST 26  --   ALT 12*  --   ALKPHOS 43  --   BILITOT 1.1  --   PROT 6.2*  --   ALBUMIN 3.2* 2.4*     INFECTIOUS No results for input(s): LATICACIDVEN, PROCALCITON in the last 168 hours.   ENDOCRINE CBG (last 3)   Recent Labs   10/18/15 2318 10/19/15 0350 10/19/15 0756  GLUCAP 104* 101* 111*         IMAGING x48h  - image(s) personally visualized  -   highlighted in bold Dg Chest Port 1 View  10/18/2015  CLINICAL DATA:  Evaluate endotracheal tube placement EXAM: PORTABLE CHEST 1 VIEW COMPARISON:  Portable chest x-ray of 10/17/2015 FINDINGS: The tip of the endotracheal tube is approximately 2.5 cm above the carina. The lungs appear well aerated. Right central venous line tip overlies the lower SVC. No pneumothorax is seen. The heart is within normal limits in size. IMPRESSION: 1. Tip of endotracheal tube 2.5 cm above the carina. 2. Right central venous line tip overlies the lower SVC. Electronically Signed   By: Ivar Drape M.D.   On: 10/18/2015 08:08       DISCUSSION: 82 yo female with acute encephalopathy and status epilepticus.  She has hx of CVA's and polypharmacy.  She required intubation for diprivan coma to control seizures. 2/12 recurrent szs with diprivan dosed decreased. Dilantin added 2/12.  ASSESSMENT / PLAN:  PULMONARY A: Acute respiratory failure in setting of status epilepticus.  P:   Full vent support Hold SBTs until seizure resolved  CARDIOVASCULAR A:  Hx of HTN, HLD. P:  Continue zocor Monitor hemodynamics  RENAL  A:    Hypomag    - replete  P:   Replace electrolytes as needed  GASTROINTESTINAL A:   Nutrition. Chronic abdominal pain. P:   Tube feeds while on vent Protonix for SUP  HEMATOLOGIC A:   No acute issues. P:  F/u CBC Lovenox for DVT prevention  INFECTIOUS  A:   No e/o Encephalitis. P:   Monitor off abx  ENDOCRINE A:   No acute issues. P:   Monitor CBGs  NEUROLOGIC A:   Acute encephalopathy 2nd to status epilepticus. Hx of anxiety, depression on chronic benzo's. Hx of CVA. Breakthrough szs when diprivan decreased   - on c-EEG and with PLEDS  P:   AEDs per neurology - dilantin, topamax, keppra, vimpat Off versed & Propofol gtt  off No WUA until seizures stopped - neuro to direct  GLOBAL 2/16 updated d in law Per neuro to see how course evolves next 1-3 days and decide on goals of care  The patient is critically ill with multiple organ systems failure and requires high complexity decision making for assessment and support, frequent evaluation and titration of therapies, application of advanced monitoring technologies and extensive interpretation of multiple databases. Critical Care Time devoted to patient  care services described in this note independent of APP time is 31 minutes.   Kara Mead MD. [facility].  Pulmonary & Critical care Pager [phone] If no response call [phone]    10/19/2015 9:22 AM

## 2015-10-19 NOTE — Procedures (Signed)
Electroencephalogram report- LTM   Ordering Physician : DR Silverio Decamp   EEG [id number]  Data acquisition: 10-20 electrode placement. Additional T1, T2, and EKG electrodes; 26 channel digital referential acquisition reformatted to 18 channel/7 channel coronal bipolar    Spike detection: ON  Seizure detection: ON   Beginning time: 10/15/15 Ending time:10/19/15  Day of study: day 5 , day 6 , day 7, day 8  This intensive EEG monitoring with simultaneous video monitoring was performed for this patient left hemispheric PLEDs and left hemispheric seizures as a part of ongoing series to ensure resolution of subclinical electrographic seizures.  Medications: Patient is intubated and sedated with propofol. Medications include Keppra and vimpat phenytoin  Day 5: There was no pushbutton activations events during this recording. Throughout the recording background activities marked by predominantly 1.5-2 cps low amplitude delta slowing with admixed faster frequencies in the theta and alpha range across right hemisphere more prominently. When patient was awake surrounded background activities attenuating becomes faster across both hemispheres.  Superimposed left hemispheric periodic lateralized epileptiform discharges were present throughout the recording with frequency 1-2 cps. Maximum negativity left central parietal cortex. At times left hemispheric periodic lateralized epileptiform discharges become rhythmic, synchronized evolving in morphology, frequency and amplitude and consistent with a subclinical electrographic seizures arising from the left hemisphere. There is no clinical correlate.   Day 6:  There was no pushbutton activations events during this recording. bBckground activities marked by predominantly 1.5-2 cps low amplitude delta slowing with admixed faster frequencies in the theta and alpha range across right hemisphere more prominently.Background activities were reactive  to external stimuli when  background activities become faster and   Attenuated.   Superimposed left hemispheric periodic lateralized epileptiform discharges were present throughout the recording with frequency 1-2 cps, and were activated by drowsiness and sleep.There are no clinical or subclinical seizures present.   Day 7: There is an improvement on this 24-hour EEG recording.  There is no clinical subclinical seizures present.  Background activities consistent with state changes and reactive with arousal.  Left hemispheric periodic lateralized epileptiform discharges become less developed.  Left periodic lateralizing epileptiform discharges were present every 2-3 seconds during first half of the recording.  During second half of the recording the left periodic lateralized epileptiform discharges occur with frequency every 3-5 seconds and more disorganized consistent with improvement.  Day 8:  There is no clinical subclinical seizures present.  Background activities consistent with state changes and reactive with arousal.  Left hemispheric periodic lateralized epileptiform discharges are  less developed and significantly less frequent throughout the recording.  There is a definitive state changes.  When patient awake alert amplitude faster frequencies emergent with 6-7 cps posterior dominant waking rhythm noted at times during  arousals  Clinical interpretation: This  intensive EEG monitoring with simultaneous monitoring  demonstrated an improvement  as  left hemispheric periodic lateralized epileptiform discharges become less developed,  less organized and occur last frequently particular during second half of the recording.  Periodic lateralized epileptiform discharges suggestive of cortical irritability in the left hemisphere however again significantly improved compared to previous studies.  There are no clinical or subclinical seizures present.  Clinical correlation is advised

## 2015-10-19 NOTE — Progress Notes (Signed)
Pt remains on full ventilator support with continuous EEG.  Continuing anti-epileptic drugs and tube feeds; neuro plans to see how course evolves for next 1-3 days before deciding on goals of care with family, per notes.   Will monitor progress.   Reinaldo Raddle, RN, BSN  Trauma/Neuro ICU Case Manager [phone]

## 2015-10-20 ENCOUNTER — Inpatient Hospital Stay (HOSPITAL_COMMUNITY): Payer: Medicare Other

## 2015-10-20 LAB — BASIC METABOLIC PANEL
ANION GAP: 9 (ref 5–15)
BUN: 24 mg/dL — AB (ref 6–20)
CHLORIDE: 104 mmol/L (ref 101–111)
CO2: 25 mmol/L (ref 22–32)
Calcium: 9.5 mg/dL (ref 8.9–10.3)
Creatinine, Ser: 0.52 mg/dL (ref 0.44–1.00)
GFR calc non Af Amer: >60 mL/min (ref >60)
Glucose, Bld: 125 mg/dL — ABNORMAL HIGH (ref 65–99)
POTASSIUM: 3.3 mmol/L — AB (ref 3.5–5.1)
SODIUM: 138 mmol/L (ref 135–145)

## 2015-10-20 LAB — GLUCOSE, CAPILLARY
GLUCOSE-CAPILLARY: 114 mg/dL — AB (ref 65–99)
GLUCOSE-CAPILLARY: 123 mg/dL — AB (ref 65–99)
Glucose-Capillary: 110 mg/dL — ABNORMAL HIGH (ref 65–99)
Glucose-Capillary: 129 mg/dL — ABNORMAL HIGH (ref 65–99)
Glucose-Capillary: 116 mg/dL — ABNORMAL HIGH (ref 65–99)
Glucose-Capillary: 108 mg/dL — ABNORMAL HIGH (ref 65–99)

## 2015-10-20 LAB — PHENYTOIN LEVEL, TOTAL: Phenytoin Lvl: 15.9 ug/mL (ref 10.0–20.0)

## 2015-10-20 LAB — VALPROIC ACID LEVEL: Valproic Acid Lvl: <10 ug/mL — ABNORMAL LOW (ref 50.0–100.0)

## 2015-10-20 MED ORDER — PHENYTOIN SODIUM 50 MG/ML IJ SOLN
50.0000 mg | Freq: Two times a day (BID) | INTRAMUSCULAR | Status: DC
  Administered 2015-10-21 – 2015-10-31 (×21): 50 mg via INTRAVENOUS
  Filled 2015-10-20: qty 2
  Filled 2015-10-20 (×3): qty 1
  Filled 2015-10-20 (×2): qty 2
  Filled 2015-10-20 (×2): qty 1
  Filled 2015-10-20: qty 2
  Filled 2015-10-20: qty 1
  Filled 2015-10-20: qty 2
  Filled 2015-10-20: qty 1
  Filled 2015-10-20: qty 2
  Filled 2015-10-20: qty 1
  Filled 2015-10-20: qty 2
  Filled 2015-10-20 (×3): qty 1
  Filled 2015-10-20 (×3): qty 2
  Filled 2015-10-20: qty 1
  Filled 2015-10-20: qty 2

## 2015-10-20 MED ORDER — PHENOBARBITAL SODIUM 130 MG/ML IJ SOLN
65.0000 mg | Freq: Three times a day (TID) | INTRAMUSCULAR | Status: DC
  Administered 2015-10-21 (×2): 65 mg via INTRAVENOUS
  Filled 2015-10-20 (×4): qty 1

## 2015-10-20 MED ORDER — POTASSIUM CHLORIDE 20 MEQ/15ML (10%) PO SOLN
20.0000 meq | ORAL | Status: AC
  Administered 2015-10-20 (×2): 20 meq
  Filled 2015-10-20 (×2): qty 15

## 2015-10-20 NOTE — Progress Notes (Signed)
Discontinues vLTM EEG for the night. Pt's skin at electrode sights are red or scabed. Will check with Neuro in the morning for re-hooking leads

## 2015-10-20 NOTE — Progress Notes (Signed)
Interval History:                                                                                                                      Kathryn [name] is an 82 y.o. female patient with status epilepticus.   LTM EEG in the past 24hrs showed continued to show improvement with resolution of left hemispheric periodic lateralized epileptiform discharges. Occasional left post temporal sharp wave and spikes were present suggestive of cortical irritability in the left hemisphere .   Past Medical History: Past Medical History  Diagnosis Date  . Depression   . Hypertension   . High cholesterol   . Rectal prolapse   . Hx of adenomatous colonic polyps   . Internal hemorrhoids   . Hypothyroidism   . Fecal incontinence   . TIA (transient ischemic attack)   . History of frequent urinary tract infections     recent  . Migraine   . Fatty liver 03/20/13  . Generalized ischemic cerebrovascular disease 10/09/2015  . Stroke [facility]) Sept 1, 2010; Sept 12, 2011    Past Surgical History  Procedure Laterality Date  . Colon surgery  2010, for prolapsed organs after hystecrtomy surgery  . Appendectomy  2008  . Cholecystectomy    . Lumbar laminectomy/decompression microdiscectomy N/A 01/07/2013    Procedure: LUMBAR LAMINECTOMY CENTRAL DECOMPRESSION L4-L5, BILATERAL FORAMENOTOMY L4,L5    ;  Surgeon: Tobi Bastos, MD;  Location: [facility] ORS;  Service: Orthopedics;  Laterality: N/A;  . Back surgery    . Hemorrhoid surgery  09/2008    Archie Endo 12/31/2010  . Abdominal hysterectomy  05/2007    Archie Endo 01/02/2011    Family History: Family History  Problem Relation Age of Onset  . Stroke Father   . Migraines Father   . CVA Father   . Heart attack Father   . Hypertension Maternal Aunt     x3  . Colon cancer Neg Hx   . CVA Mother     Social History:   reports that she has never smoked. She has never used smokeless tobacco. She reports that she does not drink alcohol or use illicit drugs.  Allergies:  Allergies   Allergen Reactions  . Augmentin [Amoxicillin-Pot Clavulanate] Swelling and Diarrhea    Throat   . Citalopram Swelling    Eyes ears and throat swelling Throat and eyes   . Codeine Anaphylaxis and Shortness Of Breath  . Fish-Derived Products Anaphylaxis  . Hyoscyamine Anaphylaxis and Swelling  . Ibuprofen Swelling    Throat and eyes   . Latex Swelling and Rash    Swelling to troat  . Lipitor [Atorvastatin] Swelling    Muscle aches throat  . Septra [Bactrim] Anaphylaxis  . Shellfish Allergy Anaphylaxis  . Miconazole Rash  . Keflet [Cephalexin] Diarrhea    Upset stomach  . Meloxicam Diarrhea and Nausea And Vomiting  . Sulfa Antibiotics     Rash and also throat was tight  . Verapamil     Tachycardia and  flushing  . Vicodin [Hydrocodone-Acetaminophen] Other (See Comments)    stroke  . Zoloft [Sertraline Hcl] Swelling    Red spots all over face, swelling of tongue and legs.   . Ciprofloxacin Nausea And Vomiting    Other reaction(s): Abdominal Pain  . Depakene [Valproate Sodium] Hives    All over body   . Isoptin Sr [Verapamil Hcl Er] Cough  . Lisinopril Cough    Fatigue and cough  . Telmisartan-Hctz Cough     Medications:                                                                                                                         Current facility-administered medications:  .  Place/Maintain arterial line, , , Until Discontinued **AND** 0.9 %  sodium chloride infusion, , Intra-arterial, PRN, Javier Glazier, MD, Stopped at 10/14/15 1151 .  acetaminophen (TYLENOL) solution 650 mg, 650 mg, Oral, Q6H PRN, Chesley Mires, MD .  antiseptic oral rinse solution (CORINZ), 7 mL, Mouth Rinse, 10 times per day, Chesley Mires, MD, 7 mL at 10/20/15 1600 .  bisacodyl (DULCOLAX) suppository 10 mg, 10 mg, Rectal, Daily PRN, Chesley Mires, MD .  chlorhexidine gluconate (PERIDEX) 0.12 % solution 15 mL, 15 mL, Mouth Rinse, BID, Chesley Mires, MD, 15 mL at 10/20/15 1943 .  enoxaparin  (LOVENOX) injection 40 mg, 40 mg, Subcutaneous, Q24H, Drema Dallas, DO, 40 mg at 10/20/15 0959 .  feeding supplement (VITAL AF 1.2 CAL) liquid 1,000 mL, 1,000 mL, Per Tube, Continuous, Brand Males, MD, Last Rate: 50 mL/hr at 10/20/15 1000, 1,000 mL at 10/20/15 1000 .  fentaNYL (SUBLIMAZE) injection 50 mcg, 50 mcg, Intravenous, Q2H PRN, Chesley Mires, MD, 50 mcg at 10/16/15 [id number] .  hydrALAZINE (APRESOLINE) injection 5 mg, 5 mg, Intravenous, Q2H PRN, Ivor Costa, MD, 5 mg at 10/15/15 1422 .  insulin aspart (novoLOG) injection 0-15 Units, 0-15 Units, Subcutaneous, 6 times per day, Chesley Mires, MD, 2 Units at 10/20/15 1214 .  lacosamide (VIMPAT) 150 mg in sodium chloride 0.9 % 25 mL IVPB, 150 mg, Intravenous, Q12H, Jennessy Sandridge Fuller Mandril, MD, 150 mg at 10/20/15 0958 .  levETIRAcetam (KEPPRA) 1,750 mg in sodium chloride 0.9 % 100 mL IVPB, 1,750 mg, Intravenous, Q12H, Marrion Finan Fuller Mandril, MD, 1,750 mg at 10/20/15 1005 .  LORazepam (ATIVAN) injection 1-2 mg, 1-2 mg, Intravenous, Q4H PRN, Kara Mead V, MD .  nystatin-triamcinolone (MYCOLOG II) cream 1 application, 1 application, Topical, BID, Lauren D Bajbus, RPH, 1 application at [phone] .  [DISCONTINUED] ondansetron (ZOFRAN) tablet 4 mg, 4 mg, Oral, Q6H PRN **OR** ondansetron (ZOFRAN) injection 4 mg, 4 mg, Intravenous, Q6H PRN, Ivor Costa, MD .  pantoprazole sodium (PROTONIX) 40 mg/20 mL oral suspension 40 mg, 40 mg, Per Tube, Q24H, Chesley Mires, MD, 40 mg at 10/20/15 1943 .  PHENObarbital (LUMINAL) injection 100 mg, 100 mg, Intravenous, TID, Eldon Zietlow Fuller Mandril, MD, 100 mg at 10/20/15 1623 .  phenytoin (DILANTIN) injection 50 mg,  50 mg, Intravenous, Q8H, Yanel Dombrosky Fuller Mandril, MD, 50 mg at 10/20/15 1943 .  pregabalin (LYRICA) capsule 50 mg, 50 mg, Oral, TID, Keniyah Gelinas Fuller Mandril, MD, 50 mg at 10/20/15 1624 .  sennosides (SENOKOT) 8.8 MG/5ML syrup 5 mL, 5 mL, Per Tube, BID PRN, Chesley Mires, MD, 5 mL at 10/15/15  0923 .  simvastatin (ZOCOR) tablet 40 mg, 40 mg, Per Tube, q morning - 10a, Chesley Mires, MD, 40 mg at 10/20/15 1000 .  sodium chloride flush (NS) 0.9 % injection 10-40 mL, 10-40 mL, Intracatheter, Q12H, Raylene Miyamoto, MD, 10 mL at 10/20/15 1006 .  sodium chloride flush (NS) 0.9 % injection 10-40 mL, 10-40 mL, Intracatheter, PRN, Raylene Miyamoto, MD .  sodium chloride flush (NS) 0.9 % injection 3 mL, 3 mL, Intravenous, Q12H, Ivor Costa, MD, 3 mL at 10/19/15 2247 .  topiramate (TOPAMAX) tablet 100 mg, 100 mg, Per Tube, BID, Wynell Balloon, RPH, 100 mg at 10/20/15 1000 .  vitamin B-12 (CYANOCOBALAMIN) tablet 1,000 mcg, 1,000 mcg, Per Tube, Daily, Chesley Mires, MD, 1,000 mcg at 10/20/15 0958 .  vitamin C (ASCORBIC ACID) tablet 500 mg, 500 mg, Per Tube, Daily, Chesley Mires, MD, 500 mg at 10/20/15 1005 .  vitamin e oil OIL 200 Units, 200 Units, Per Tube, Daily, Chesley Mires, MD, 200 Units at 10/20/15 0958   Neurologic Examination:                                                                                                      Blood pressure 152/70, pulse 95, temperature 99.5 F (37.5 C), temperature source Axillary, resp. rate 18, height 5\' 3"  (1.6 m), weight 56.9 kg (125 lb 7.1 oz), SpO2 100 %.  Patient intubated,  No gaze deviation. Preserved brain stem reflexes.  Withdraws to stimulus in all 4 extremities, opens eyes to stimulus. .    Lab Results: Basic Metabolic Panel:  Recent Labs Lab 10/15/15 0421 10/16/15 0831 10/17/15 0500 10/18/15 0425 10/19/15 0600 10/20/15 0531  NA 137 137  --  139 138 138  K 3.6 3.2*  --  2.9* 3.6 3.3*  CL 102 97*  --  103 105 104  CO2 26 28  --  28 25 25   GLUCOSE 128* 122*  --  122* 122* 125*  BUN 15 19  --  21* 20 24*  CREATININE 0.57 0.57  --  0.48 0.54 0.52  CALCIUM 8.5* 9.3  --  8.9 9.5 9.5  MG 1.7 1.8 1.7 1.8 1.6*  --   PHOS 2.9 2.7 2.6 2.5 2.7  --     Liver Function Tests:  Recent Labs Lab 10/17/15 0500  ALBUMIN 2.4*   No  results for input(s): LIPASE, AMYLASE in the last 168 hours. No results for input(s): AMMONIA in the last 168 hours.  CBC:  Recent Labs Lab 10/15/15 0421 10/16/15 0831 10/17/15 0500 10/18/15 0425 10/19/15 0600  WBC 6.1 7.1 5.9 4.4 7.5  NEUTROABS  --  5.1 3.7 2.1 4.4  HGB 9.9* 10.5* 9.6* 8.9* 9.9*  HCT 29.8* 32.5* 28.2*  27.9* 31.8*  MCV 90.3 92.3 94.0 94.3 94.9  PLT 192 231 204 230 334    Cardiac Enzymes: No results for input(s): CKTOTAL, CKMB, CKMBINDEX, TROPONINI in the last 168 hours.  Lipid Panel:  Recent Labs Lab 10/15/15 1823 10/16/15 1411 10/18/15 1815  TRIG 59 69 99    CBG:  Recent Labs Lab 10/20/15 0001 10/20/15 0341 10/20/15 0737 10/20/15 1119 10/20/15 1616  GLUCAP 114* 123* 110* 129* 116*    Microbiology: Results for orders placed or performed during the hospital encounter of 10/08/15  Urine culture     Status: None   Collection Time: 10/09/15  1:43 AM  Result Value Ref Range Status   Specimen Description URINE, CATHETERIZED  Final   Special Requests NONE  Final   Culture   Final    >=100,000 COLONIES/mL DIPHTHEROIDS(CORYNEBACTERIUM SPECIES) Standardized susceptibility testing for this organism is not available.    Report Status 10/10/2015 FINAL  Final  Culture, blood (Routine X 2) w Reflex to ID Panel     Status: None   Collection Time: 10/09/15  5:30 AM  Result Value Ref Range Status   Specimen Description BLOOD LEFT HAND  Final   Special Requests IN PEDIATRIC BOTTLE 1CC  Final   Culture NO GROWTH 5 DAYS  Final   Report Status 10/14/2015 FINAL  Final  Culture, blood (Routine X 2) w Reflex to ID Panel     Status: None   Collection Time: 10/09/15  5:38 AM  Result Value Ref Range Status   Specimen Description BLOOD LEFT ARM  Final   Special Requests IN PEDIATRIC BOTTLE [place]  Final   Culture NO GROWTH 5 DAYS  Final   Report Status 10/14/2015 FINAL  Final  MRSA PCR Screening     Status: None   Collection Time: 10/10/15 11:39 PM  Result  Value Ref Range Status   MRSA by PCR NEGATIVE NEGATIVE Final    Comment:        The GeneXpert MRSA Assay (FDA approved for NASAL specimens only), is one component of a comprehensive MRSA colonization surveillance program. It is not intended to diagnose MRSA infection nor to guide or monitor treatment for MRSA infections.   CSF culture     Status: None   Collection Time: 10/11/15  1:33 PM  Result Value Ref Range Status   Specimen Description CSF  Final   Special Requests CSF NO 2  Final   Gram Stain   Final    CYTOSPIN SMEAR WBC PRESENT, PREDOMINANTLY PMN NO ORGANISMS SEEN    Culture NO GROWTH 3 DAYS  Final   Report Status 10/14/2015 FINAL  Final  Culture, respiratory (NON-Expectorated)     Status: None   Collection Time: 10/13/15  9:30 AM  Result Value Ref Range Status   Specimen Description ENDOTRACHEAL  Final   Special Requests NONE  Final   Gram Stain   Final    FEW WBC PRESENT,BOTH PMN AND MONONUCLEAR RARE SQUAMOUS EPITHELIAL CELLS PRESENT NO ORGANISMS SEEN Performed at [facility]    Culture   Final    FEW YEAST CONSISTENT WITH CANDIDA SPECIES Performed at [facility]    Report Status 10/16/2015 FINAL  Final    Imaging: Dg Chest Port 1 View  10/20/2015  CLINICAL DATA:  Acute respiratory failure.  Subsequent encounter. EXAM: PORTABLE CHEST 1 VIEW COMPARISON:  10/18/2015 FINDINGS: Cardiac silhouette normal in size and configuration. Normal mediastinal and hilar contours. Mild atelectasis at the lung bases, stable. Lungs  otherwise clear. No pneumothorax. Endotracheal tube tip is stable projecting 2.5 cm above the carina. Right PICC and oral/nasogastric tube are also well positioned and stable. IMPRESSION: 1. No acute cardiopulmonary disease. 2. Stable support apparatus.  No change from prior study. Electronically Signed   By: Lajean Manes M.D.   On: 10/20/2015 08:48   Dg Chest Port 1 View  10/18/2015  CLINICAL DATA:  Evaluate endotracheal tube  placement EXAM: PORTABLE CHEST 1 VIEW COMPARISON:  Portable chest x-ray of 10/17/2015 FINDINGS: The tip of the endotracheal tube is approximately 2.5 cm above the carina. The lungs appear well aerated. Right central venous line tip overlies the lower SVC. No pneumothorax is seen. The heart is within normal limits in size. IMPRESSION: 1. Tip of endotracheal tube 2.5 cm above the carina. 2. Right central venous line tip overlies the lower SVC. Electronically Signed   By: Ivar Drape M.D.   On: 10/18/2015 08:08   Dg Chest Port 1 View  10/17/2015  CLINICAL DATA:  Endotracheal tube placement. EXAM: PORTABLE CHEST 1 VIEW COMPARISON:  10/16/2015 FINDINGS: Patient slightly rotated to the right endotracheal tube tip is 1.8 cm above the carina. Nasogastric tube courses into the region of the stomach and off the inferior portion of the film as tip is not visualized. Interval placement of right-sided PICC line with tip overlying the region of the SVC. Lungs are adequately inflated without focal consolidation or effusion. Cardiomediastinal silhouette and remainder of the exam is unchanged. IMPRESSION: No acute cardiopulmonary disease. Tubes and lines as described. Electronically Signed   By: Marin Olp M.D.   On: 10/17/2015 07:58    Assessment and plan:   Kathryn [name] is an 82 y.o. female patient with left hemispheric status epilepticus, resolved now on EEG with current medication regimen. EEG in the past 24 hrs showed improvement with resolution of left hemispheric periodic lateralized epileptiform discharges. Occasional left post temporal sharp wave and spikes were present suggestive of cortical irritability in the left hemisphere .At this point, recommend to wean off from sedation with propofol and versed, and reduced dose of several of her seizure medications in an attempt to lighten sedation, and work towards extubation.  Keppra reduced to 1750 mg BID, vimpat to 150 mg BID, lyrica 50 mg TID, dilantin 50 mg  TID, Topamax 100 mg BID, phenobarbital 100 mg TID.  Continue LTM EEG.  Will follow up.

## 2015-10-20 NOTE — Progress Notes (Signed)
[facility] ADULT ICU REPLACEMENT PROTOCOL FOR AM LAB REPLACEMENT ONLY  The patient does apply for the [facility] Adult ICU Electrolyte Replacment Protocol based on the criteria listed below:   1. Is GFR >/= 40 ml/min? Yes.    Patient's GFR today is >60 2. Is urine output >/= 0.5 ml/kg/hr for the last 6 hours? Yes.   Patient's UOP is 0.7 ml/kg/hr 3. Is BUN < 60 mg/dL? Yes.    Patient's BUN today is 24 4. Abnormal electrolyte(s): K+3.3 5. Ordered repletion with: protocol 6. If a panic level lab has been reported, has the CCM MD in charge been notified? No..   Physician:  E Deterding  [facility] 10/20/2015 6:37 AM

## 2015-10-20 NOTE — Progress Notes (Signed)
PULMONARY / CRITICAL CARE MEDICINE   Name: Kathryn [name] MRN: [id number] DOB: July 19, 1931    ADMISSION DATE:  10/08/2015 CONSULTATION DATE:  10/12/2015  REFERRING MD:  Dr. Janann Colonel, neurology  CHIEF COMPLAINT:  Seizures.  HISTORY OF PRESENT ILLNESS:   Hx from chart.  82 yo female brought to ER due to confusion after falling.  She was also having visual hallucinations.  She was noted to have seizure on 2/08 and neurology consulted.  She was also noted to have fever.  She had LP that was unrevealing.  She was started on abx for possible UTI, and acyclovir for possible encephalitis.  Her seizures persistent, and she was transferred to ICU for diprivan coma and intubation.  CULTURES: 2/07 Urine >> diphtheroids 2/07 Blood >>NGTD 2/09 CSF >>NGTD  ANTIBIOTICS: 2/06 Levaquin >>off 2/09 Acyclovir >> 2/11 2/10 Vanco>>off 2/10 ceftaz>> off 2/11 sputum>> candida  Tubes  2/10 ETT >>   STUDIES and events:  2/06 Admit 2/08 Noted to have seizure, neuro consulted 2/06 CT head >> atrophy 2/07 MRI brain >> remote basal ganglia and corona radiata infarcts b/l, remote Rt thalamus and posterior limb internal capsule infarcts, global atrophy 2/08 EEG >> lt temporal region focal seizures, lt hemisphere slowing 2/09 LP >> 140 RBC, 2 WBC, protein 38, glucose 51 2/10 Status epilepticus >> to ICU, diprivan coma  2./12 - Sedated on vent, szs overnight  2/13 - afebrile with ongoing c-eeg and diprivan 10/16/15 - c-EEG off. PEr tech - PLEDS +. c-versed gtt started by neuro per RN.  MRI - New signal abnormality in the left thalamus  2/15 EEG - PLEDS 2/17 - EEG - PLEDS improved  SUBJECTIVE/OVERNIGHT/INTERVAL HX Remains critically ill &   Unresponsive On c EEG Afebrile Good UO  VITAL SIGNS: BP 144/80 mmHg  Pulse 95  Temp(Src) 99.6 F (37.6 C) (Axillary)  Resp 15  Ht 5\' 3"  (1.6 m)  Wt 56.9 kg (125 lb 7.1 oz)  BMI 22.23 kg/m2  SpO2 100%  HEMODYNAMICS:    VENTILATOR SETTINGS: Vent Mode:  [-]  PRVC FiO2 (%):  [40 %] 40 % Set Rate:  [10 bmp] 10 bmp Vt Set:  [450 mL] 450 mL PEEP:  [5 cmH20] 5 cmH20 Pressure Support:  [5 cmH20] 5 cmH20 Plateau Pressure:  [14 cmH20-15 cmH20] 15 cmH20  INTAKE / OUTPUT: I/O last 3 completed shifts: In: 2391 [NG/GT:1700; IV Piggyback:691] Out: 2950 [Urine:2950]  PHYSICAL EXAMINATION: General: chr  Ill appearing Neuro:  Comatose, no response to DPS HEENT: No JVD Cardiovascular:  Regular, no murmur Lungs:  B/l rhonchi Abdomen:  Soft, nontender Musculoskeletal:  No edema Skin:  No rashes  LABS:  PULMONARY No results for input(s): PHART, PCO2ART, PO2ART, HCO3, TCO2, O2SAT in the last 168 hours.  Invalid input(s): PCO2, PO2  CBC  Recent Labs Lab 10/17/15 0500 10/18/15 0425 10/19/15 0600  HGB 9.6* 8.9* 9.9*  HCT 28.2* 27.9* 31.8*  WBC 5.9 4.4 7.5  PLT 204 230 334    COAGULATION No results for input(s): INR in the last 168 hours.  CARDIAC  No results for input(s): TROPONINI in the last 168 hours. No results for input(s): PROBNP in the last 168 hours.   CHEMISTRY  Recent Labs Lab 10/15/15 0421 10/16/15 0831 10/17/15 0500 10/18/15 0425 10/19/15 0600 10/20/15 0531  NA 137 137  --  139 138 138  K 3.6 3.2*  --  2.9* 3.6 3.3*  CL 102 97*  --  103 105 104  CO2 26 28  --  28 25 25   GLUCOSE 128* 122*  --  122* 122* 125*  BUN 15 19  --  21* 20 24*  CREATININE 0.57 0.57  --  0.48 0.54 0.52  CALCIUM 8.5* 9.3  --  8.9 9.5 9.5  MG 1.7 1.8 1.7 1.8 1.6*  --   PHOS 2.9 2.7 2.6 2.5 2.7  --    Estimated Creatinine Clearance: 44.1 mL/min (by C-G formula based on Cr of 0.52).   LIVER  Recent Labs Lab 10/17/15 0500  ALBUMIN 2.4*     INFECTIOUS No results for input(s): LATICACIDVEN, PROCALCITON in the last 168 hours.   ENDOCRINE CBG (last 3)   Recent Labs  10/19/15 1937 10/20/15 0001 10/20/15 0341  GLUCAP 110* 114* 123*         IMAGING x48h  - image(s) personally visualized  -   highlighted in bold No  results found.     DISCUSSION: 82 yo female with acute encephalopathy and status epilepticus.  She has hx of CVA's and polypharmacy.  She required intubation for diprivan coma to control seizures. 2/12 recurrent szs with diprivan dosed decreased. Dilantin added 2/12.  ASSESSMENT / PLAN:  PULMONARY A: Acute respiratory failure in setting of status epilepticus.  P:   Full vent support Hold SBTs until seizure resolved  CARDIOVASCULAR A:  Hx of HTN, HLD. P:  Continue zocor  RENAL  A:    Hypomag hypokalemia  P:   Replace electrolytes as needed  GASTROINTESTINAL A:   Nutrition. Chronic abdominal pain. P:   Tube feeds while on vent Protonix for SUP  HEMATOLOGIC A:   No acute issues. P:  F/u CBC Lovenox for DVT prevention  INFECTIOUS  A:   No e/o Encephalitis. P:   Monitor off abx  ENDOCRINE A:   No acute issues. P:   Monitor CBGs  NEUROLOGIC A:   Acute encephalopathy 2nd to status epilepticus. Hx of anxiety, depression on chronic benzo's. Hx of CVA. Breakthrough szs when diprivan decreased   - on c-EEG and with PLEDS  P:   AEDs per neurology - dilantin, topamax, keppra, vimpat Off versed & Propofol gtt   GLOBAL 2/16 updated d in law Per neuro to see how course evolves next 1-3 days and decide on goals of care  The patient is critically ill with multiple organ systems failure and requires high complexity decision making for assessment and support, frequent evaluation and titration of therapies, application of advanced monitoring technologies and extensive interpretation of multiple databases. Critical Care Time devoted to patient care services described in this note independent of APP time is 31 minutes.   Kara Mead MD. Shade Flood.  Pulmonary & Critical care Pager [phone] If no response call [phone]    10/20/2015 7:44 AM

## 2015-10-20 NOTE — Procedures (Signed)
Electroencephalogram report- LTM   Ordering Physician : DR Silverio Decamp   EEG [id number]  Data acquisition: 10-20 electrode placement. Additional T1, T2, and EKG electrodes; 26 channel digital referential acquisition reformatted to 18 channel/7 channel coronal bipolar    Spike detection: ON  Seizure detection: ON   Beginning time: 10/15/15 Ending time:10/20/15  Day of study: day 5 , day 6 , day 7, day 8, day 9  This intensive EEG monitoring with simultaneous video monitoring was performed for this patient left hemispheric PLEDs and left hemispheric seizures as a part of ongoing series to ensure resolution of subclinical electrographic seizures.  Medications: Patient is intubated and sedated with propofol. Medications include Keppra and vimpat phenytoin  Day 5: There was no pushbutton activations events during this recording. Throughout the recording background activities marked by predominantly 1.5-2 cps low amplitude delta slowing with admixed faster frequencies in the theta and alpha range across right hemisphere more prominently. When patient was awake surrounded background activities attenuating becomes faster across both hemispheres.  Superimposed left hemispheric periodic lateralized epileptiform discharges were present throughout the recording with frequency 1-2 cps. Maximum negativity left central parietal cortex. At times left hemispheric periodic lateralized epileptiform discharges become rhythmic, synchronized evolving in morphology, frequency and amplitude and consistent with a subclinical electrographic seizures arising from the left hemisphere. There is no clinical correlate.   Day 6:  There was no pushbutton activations events during this recording. bBckground activities marked by predominantly 1.5-2 cps low amplitude delta slowing with admixed faster frequencies in the theta and alpha range across right hemisphere more prominently.Background activities were  reactive to external stimuli when  background activities become faster and   Attenuated.   Superimposed left hemispheric periodic lateralized epileptiform discharges were present throughout the recording with frequency 1-2 cps, and were activated by drowsiness and sleep.There are no clinical or subclinical seizures present.   Day 7: There is an improvement on this 24-hour EEG recording.  There is no clinical subclinical seizures present.  Background activities consistent with state changes and reactive with arousal.  Left hemispheric periodic lateralized epileptiform discharges become less developed.  Left periodic lateralizing epileptiform discharges were present every 2-3 seconds during first half of the recording.  During second half of the recording the left periodic lateralized epileptiform discharges occur with frequency every 3-5 seconds and more disorganized consistent with improvement.  Day 8:  There is no clinical subclinical seizures present.  Background activities consistent with state changes and reactive with arousal.  Left hemispheric periodic lateralized epileptiform discharges are  less developed and significantly less frequent throughout the recording.  There is a definitive state changes.  When patient awake alert amplitude faster frequencies emergent with 6-7 cps posterior dominant waking rhythm noted at times during  Arousals  Day 9: There is no clinical subclinical seizures present.  Background activities are marked by delta and theta slowing, reactive to external stimuli.   Left hemispheric periodic lateralized epileptiform discharges are no longer present on this study. Occasional left hemispheric sharp waves and spikes were present throughout the recording. Maximum negativity left post temporal cortex.   Clinical interpretation: This  intensive EEG monitoring with simultaneous monitoring  demonstrated an improvement with resolution of  left hemispheric periodic lateralized  epileptiform discharges.  Occasional left post temporal sharp wave and  spikes were present suggestive of cortical irritability in the left hemisphere .  There are no clinical or subclinical seizures present.  Clinical correlation is advised.

## 2015-10-21 LAB — BASIC METABOLIC PANEL
ANION GAP: 9 (ref 5–15)
BUN: 27 mg/dL — AB (ref 6–20)
CALCIUM: 9.5 mg/dL (ref 8.9–10.3)
CO2: 26 mmol/L (ref 22–32)
CREATININE: 0.48 mg/dL (ref 0.44–1.00)
Chloride: 106 mmol/L (ref 101–111)
GFR calc non Af Amer: >60 mL/min (ref >60)
Glucose, Bld: 128 mg/dL — ABNORMAL HIGH (ref 65–99)
Potassium: 3.3 mmol/L — ABNORMAL LOW (ref 3.5–5.1)
SODIUM: 141 mmol/L (ref 135–145)

## 2015-10-21 LAB — GLUCOSE, CAPILLARY
GLUCOSE-CAPILLARY: 109 mg/dL — AB (ref 65–99)
GLUCOSE-CAPILLARY: 116 mg/dL — AB (ref 65–99)
GLUCOSE-CAPILLARY: 128 mg/dL — AB (ref 65–99)
Glucose-Capillary: 108 mg/dL — ABNORMAL HIGH (ref 65–99)
Glucose-Capillary: 120 mg/dL — ABNORMAL HIGH (ref 65–99)
Glucose-Capillary: 118 mg/dL — ABNORMAL HIGH (ref 65–99)
Glucose-Capillary: 109 mg/dL — ABNORMAL HIGH (ref 65–99)

## 2015-10-21 LAB — CBC
HCT: 29.4 % — ABNORMAL LOW (ref 36.0–46.0)
Hemoglobin: 9.1 g/dL — ABNORMAL LOW (ref 12.0–15.0)
MCH: 29.4 pg (ref 26.0–34.0)
MCHC: 31 g/dL (ref 30.0–36.0)
MCV: 95.1 fL (ref 78.0–100.0)
PLATELETS: 282 10*3/uL (ref 150–400)
RBC: 3.09 MIL/uL — ABNORMAL LOW (ref 3.87–5.11)
RDW: 13.6 % (ref 11.5–15.5)
WBC: 4.9 10*3/uL (ref 4.0–10.5)

## 2015-10-21 MED ORDER — POTASSIUM CHLORIDE 20 MEQ/15ML (10%) PO SOLN
40.0000 meq | Freq: Once | ORAL | Status: AC
  Administered 2015-10-21: 40 meq
  Filled 2015-10-21: qty 30

## 2015-10-21 MED ORDER — SODIUM CHLORIDE 0.9 % IV SOLN
100.0000 mg | Freq: Two times a day (BID) | INTRAVENOUS | Status: DC
  Administered 2015-10-21 – 2015-10-29 (×16): 100 mg via INTRAVENOUS
  Filled 2015-10-21 (×32): qty 10

## 2015-10-21 MED ORDER — SODIUM CHLORIDE 0.9 % IV SOLN
1500.0000 mg | Freq: Two times a day (BID) | INTRAVENOUS | Status: DC
  Administered 2015-10-21 – 2015-10-29 (×16): 1500 mg via INTRAVENOUS
  Filled 2015-10-21 (×19): qty 15

## 2015-10-21 MED ORDER — PHENOBARBITAL SODIUM 130 MG/ML IJ SOLN
32.5000 mg | Freq: Three times a day (TID) | INTRAMUSCULAR | Status: DC
  Administered 2015-10-21 – 2015-10-25 (×13): 32.5 mg via INTRAVENOUS
  Filled 2015-10-21 (×12): qty 1

## 2015-10-21 NOTE — Procedures (Signed)
Electroencephalogram report- LTM   Ordering Physician : DR Silverio Decamp   EEG [id number]  Data acquisition: 10-20 electrode placement. Additional T1, T2, and EKG electrodes; 26 channel digital referential acquisition reformatted to 18 channel/7 channel coronal bipolar    Spike detection: ON  Seizure detection: ON   Beginning time: 10/15/15 Ending time:10/21/15  Day of study: day 5 -day 10  This intensive EEG monitoring with simultaneous video monitoring was performed for this patient left hemispheric PLEDs and left hemispheric seizures as a part of ongoing series to ensure resolution of subclinical electrographic seizures.  Medications:  Medications include Keppra and vimpat phenytoin  Day 5: There was no pushbutton activations events during this recording. Throughout the recording background activities marked by predominantly 1.5-2 cps low amplitude delta slowing with admixed faster frequencies in the theta and alpha range across right hemisphere more prominently. When patient was awake surrounded background activities attenuating becomes faster across both hemispheres.  Superimposed left hemispheric periodic lateralized epileptiform discharges were present throughout the recording with frequency 1-2 cps. Maximum negativity left central parietal cortex. At times left hemispheric periodic lateralized epileptiform discharges become rhythmic, synchronized evolving in morphology, frequency and amplitude and consistent with a subclinical electrographic seizures arising from the left hemisphere. There is no clinical correlate.   Day 6:  There was no pushbutton activations events during this recording. bBckground activities marked by predominantly 1.5-2 cps low amplitude delta slowing with admixed faster frequencies in the theta and alpha range across right hemisphere more prominently.Background activities were reactive to external stimuli when  background activities become faster  and   Attenuated.   Superimposed left hemispheric periodic lateralized epileptiform discharges were present throughout the recording with frequency 1-2 cps, and were activated by drowsiness and sleep.There are no clinical or subclinical seizures present.   Day 7: There is an improvement on this 24-hour EEG recording.  There is no clinical subclinical seizures present.  Background activities consistent with state changes and reactive with arousal.  Left hemispheric periodic lateralized epileptiform discharges become less developed.  Left periodic lateralizing epileptiform discharges were present every 2-3 seconds during first half of the recording.  During second half of the recording the left periodic lateralized epileptiform discharges occur with frequency every 3-5 seconds and more disorganized consistent with improvement.  Day 8:  There is no clinical subclinical seizures present.  Background activities consistent with state changes and reactive with arousal.  Left hemispheric periodic lateralized epileptiform discharges are  less developed and significantly less frequent throughout the recording.  There is a definitive state changes.  When patient awake alert amplitude faster frequencies emergent with 6-7 cps posterior dominant waking rhythm noted at times during  Arousals  Day 9: There is no clinical subclinical seizures present.  Background activities are marked by delta and theta slowing, reactive to external stimuli.   Left hemispheric periodic lateralized epileptiform discharges are no longer present on this study. Occasional left hemispheric sharp waves and spikes were present throughout the recording. Maximum negativity left post temporal cortex.   Day 10: There is no clinical subclinical seizures present.  Background activities are marked by delta and theta slowing, reactive to external stimuli.   Left hemispheric periodic lateralized epileptiform discharges are no longer present . Occasional  left hemispheric sharp waves  were present throughout the recording with maximum negativity in the  left post temporal cortex.    Clinical interpretation: This  intensive EEG monitoring with simultaneous monitoring  demonstrated an improvement with resolution of  left hemispheric  periodic lateralized epileptiform discharges.  Occasional left post temporal sharp wave and  spikes were present suggestive of cortical irritability in the left hemisphere .  There are no clinical or subclinical seizures present.  Clinical correlation is advised.

## 2015-10-21 NOTE — Progress Notes (Signed)
PULMONARY / CRITICAL CARE MEDICINE   Name: Kathryn [name] MRN: [id number] DOB: 01-03-1931    ADMISSION DATE:  10/08/2015 CONSULTATION DATE:  10/12/2015  REFERRING MD:  Dr. Janann Colonel, neurology  CHIEF COMPLAINT:  Seizures.  HISTORY OF PRESENT ILLNESS:   82 yo female brought to ER due to confusion after falling.  She was also having visual hallucinations.  She was noted to have seizure on 2/08 and neurology consulted.  She was also noted to have fever.  She had LP that was unrevealing.  She was started on abx for possible UTI, and acyclovir for possible encephalitis.  Her seizures persistent, and she was transferred to ICU for diprivan coma and intubation.  CULTURES: 2/07 Urine >> diphtheroids 2/07 Blood >>NGTD 2/09 CSF >>NGTD  ANTIBIOTICS: 2/06 Levaquin >>off 2/09 Acyclovir >> 2/11 2/10 Vanco>>off 2/10 ceftaz>> off 2/11 sputum>> candida  Tubes  2/10 ETT >>   STUDIES and events:  2/06 Admit 2/08 Noted to have seizure, neuro consulted 2/06 CT head >> atrophy 2/07 MRI brain >> remote basal ganglia and corona radiata infarcts b/l, remote Rt thalamus and posterior limb internal capsule infarcts, global atrophy 2/08 EEG >> lt temporal region focal seizures, lt hemisphere slowing 2/09 LP >> 140 RBC, 2 WBC, protein 38, glucose 51 2/10 Status epilepticus >> to ICU, diprivan coma  2./12 - Sedated on vent, szs overnight  2/13 - afebrile with ongoing c-eeg and diprivan 10/16/15 - c-EEG off. PEr tech - PLEDS +. c-versed gtt started by neuro per RN.  MRI - New signal abnormality in the left thalamus  2/15 EEG - PLEDS 2/17 - EEG - PLEDS improved 2/18 PLEDS resolved  SUBJECTIVE/OVERNIGHT/INTERVAL HX Remains critically ill &   Unresponsive  Off c EEG Afebrile Good UO  VITAL SIGNS: BP 139/62 mmHg  Pulse 85  Temp(Src) 98.6 F (37 C) (Axillary)  Resp 12  Ht 5\' 3"  (1.6 m)  Wt 56.4 kg (124 lb 5.4 oz)  BMI 22.03 kg/m2  SpO2 100%  HEMODYNAMICS:    VENTILATOR SETTINGS: Vent Mode:   [-] PRVC FiO2 (%):  [40 %] 40 % Set Rate:  [7 bmp-10 bmp] 10 bmp Vt Set:  [450 mL] 450 mL PEEP:  [5 cmH20] 5 cmH20 Plateau Pressure:  [14 cmH20-16 cmH20] 14 cmH20  INTAKE / OUTPUT: I/O last 3 completed shifts: In: 2342 [I.V.:10; NG/GT:1860; IV Piggyback:472] Out: 2320 [Urine:2320]  PHYSICAL EXAMINATION: General: chr  Ill appearing Neuro:  Comatose, no response to DPS HEENT: No JVD Cardiovascular:  Regular, no murmur Lungs:  B/l rhonchi Abdomen:  Soft, nontender Musculoskeletal:  No edema Skin:  No rashes  LABS:  PULMONARY No results for input(s): PHART, PCO2ART, PO2ART, HCO3, TCO2, O2SAT in the last 168 hours.  Invalid input(s): PCO2, PO2  CBC  Recent Labs Lab 10/18/15 0425 10/19/15 0600 10/21/15 0600  HGB 8.9* 9.9* 9.1*  HCT 27.9* 31.8* 29.4*  WBC 4.4 7.5 4.9  PLT 230 334 282    COAGULATION No results for input(s): INR in the last 168 hours.  CARDIAC  No results for input(s): TROPONINI in the last 168 hours. No results for input(s): PROBNP in the last 168 hours.   CHEMISTRY  Recent Labs Lab 10/15/15 0421 10/16/15 0831 10/17/15 0500 10/18/15 0425 10/19/15 0600 10/20/15 0531  NA 137 137  --  139 138 138  K 3.6 3.2*  --  2.9* 3.6 3.3*  CL 102 97*  --  103 105 104  CO2 26 28  --  28 25 25  GLUCOSE 128* 122*  --  122* 122* 125*  BUN 15 19  --  21* 20 24*  CREATININE 0.57 0.57  --  0.48 0.54 0.52  CALCIUM 8.5* 9.3  --  8.9 9.5 9.5  MG 1.7 1.8 1.7 1.8 1.6*  --   PHOS 2.9 2.7 2.6 2.5 2.7  --    Estimated Creatinine Clearance: 44.1 mL/min (by C-G formula based on Cr of 0.52).   LIVER  Recent Labs Lab 10/17/15 0500  ALBUMIN 2.4*     INFECTIOUS No results for input(s): LATICACIDVEN, PROCALCITON in the last 168 hours.   ENDOCRINE CBG (last 3)   Recent Labs  10/20/15 1945 10/20/15 2332 10/21/15 0329  GLUCAP 108* 109* 108*         IMAGING x48h  - image(s) personally visualized  -   highlighted in bold Dg Chest Port 1  View  10/20/2015  CLINICAL DATA:  Acute respiratory failure.  Subsequent encounter. EXAM: PORTABLE CHEST 1 VIEW COMPARISON:  10/18/2015 FINDINGS: Cardiac silhouette normal in size and configuration. Normal mediastinal and hilar contours. Mild atelectasis at the lung bases, stable. Lungs otherwise clear. No pneumothorax. Endotracheal tube tip is stable projecting 2.5 cm above the carina. Right PICC and oral/nasogastric tube are also well positioned and stable. IMPRESSION: 1. No acute cardiopulmonary disease. 2. Stable support apparatus.  No change from prior study. Electronically Signed   By: Lajean Manes M.D.   On: 10/20/2015 08:48       DISCUSSION: 82 yo female with acute encephalopathy and status epilepticus.  She has hx of CVA's and polypharmacy.  She required intubation for diprivan coma to control seizures. 2/12 recurrent szs with diprivan dosed decreased. .  ASSESSMENT / PLAN:  PULMONARY A: Acute respiratory failure in setting of status epilepticus.  P:   Full vent support Hold SBTs until seizure resolved  CARDIOVASCULAR A:  Hx of HTN, HLD. P:  Continue zocor  RENAL  A:    Hypomag hypokalemia  P:   Replace electrolytes as needed  GASTROINTESTINAL A:   Nutrition. Chronic abdominal pain. P:   Tube feeds while on vent Protonix for SUP  HEMATOLOGIC A:   No acute issues. P:  F/u CBC Lovenox for DVT prevention  INFECTIOUS  A:   No e/o Encephalitis. P:   Monitor off abx  ENDOCRINE A:   No acute issues. P:   Monitor CBGs  NEUROLOGIC A:   Acute encephalopathy 2nd to status epilepticus. Hx of anxiety, depression on chronic benzo's. Hx of CVA. Breakthrough szs when diprivan decreased   - off c-EEG, PLEDS have resolved  P:   AEDs being tapered  per neurology - dilantin, topamax, keppra, vimpat Off versed & Propofol gtt    GLOBAL 2/16 updated d in law Per neuro , Waiting for her to wake up as AEDs tapered  The patient is critically ill with  multiple organ systems failure and requires high complexity decision making for assessment and support, frequent evaluation and titration of therapies, application of advanced monitoring technologies and extensive interpretation of multiple databases. Critical Care Time devoted to patient care services described in this note independent of APP time is 31 minutes.   Kara Mead MD. Shade Flood. [facility] Pager [phone] If no response call [phone]    10/21/2015 7:26 AM

## 2015-10-21 NOTE — Progress Notes (Signed)
Interval History:                                                                                                                      Kathryn [name] is an 82 y.o. female patient with resolved status epilepticus as described in my prior notes.   LTM EEG in past 24 hours showed  no clinical or subclinical seizures present. Background activities are marked by delta and theta slowing, reactive to external stimuli. Left hemispheric periodic lateralized epileptiform discharges are no longer present . Occasional left hemispheric sharp waves were present throughout the recording with maximum negativity in the left post temporal cortex. Overall, it demonstrated an improvement with resolution of left hemispheric periodic lateralized epileptiform discharges. Occasional left post temporal sharp wave and spikes were present suggestive of cortical irritability in the left hemisphere .  Her seizure mediations are being slowly weaned down, and EEG remains stable, no seizures.    Past Medical History: Past Medical History  Diagnosis Date  . Depression   . Hypertension   . High cholesterol   . Rectal prolapse   . Hx of adenomatous colonic polyps   . Internal hemorrhoids   . Hypothyroidism   . Fecal incontinence   . TIA (transient ischemic attack)   . History of frequent urinary tract infections     recent  . Migraine   . Fatty liver 03/20/13  . Generalized ischemic cerebrovascular disease 10/09/2015  . Stroke [facility]) Sept 1, 2010; Sept 12, 2011    Past Surgical History  Procedure Laterality Date  . Colon surgery  2010, for prolapsed organs after hystecrtomy surgery  . Appendectomy  2008  . Cholecystectomy    . Lumbar laminectomy/decompression microdiscectomy N/A 01/07/2013    Procedure: LUMBAR LAMINECTOMY CENTRAL DECOMPRESSION L4-L5, BILATERAL FORAMENOTOMY L4,L5    ;  Surgeon: Tobi Bastos, MD;  Location: [facility];  Service: Orthopedics;  Laterality: N/A;  . Back surgery    . Hemorrhoid surgery   09/2008    Archie Endo 12/31/2010  . Abdominal hysterectomy  05/2007    Archie Endo 01/02/2011    Family History: Family History  Problem Relation Age of Onset  . Stroke Father   . Migraines Father   . CVA Father   . Heart attack Father   . Hypertension Maternal Aunt     x3  . Colon cancer Neg Hx   . CVA Mother     Social History:   reports that she has never smoked. She has never used smokeless tobacco. She reports that she does not drink alcohol or use illicit drugs.  Allergies:  Allergies  Allergen Reactions  . Augmentin [Amoxicillin-Pot Clavulanate] Swelling and Diarrhea    Throat   . Citalopram Swelling    Eyes ears and throat swelling Throat and eyes   . Codeine Anaphylaxis and Shortness Of Breath  . Fish-Derived Products Anaphylaxis  . Hyoscyamine Anaphylaxis and Swelling  . Ibuprofen Swelling    Throat and eyes   . Latex Swelling  and Rash    Swelling to troat  . Lipitor [Atorvastatin] Swelling    Muscle aches throat  . Septra [Bactrim] Anaphylaxis  . Shellfish Allergy Anaphylaxis  . Miconazole Rash  . Keflet [Cephalexin] Diarrhea    Upset stomach  . Meloxicam Diarrhea and Nausea And Vomiting  . Sulfa Antibiotics     Rash and also throat was tight  . Verapamil     Tachycardia and flushing  . Vicodin [Hydrocodone-Acetaminophen] Other (See Comments)    stroke  . Zoloft [Sertraline Hcl] Swelling    Red spots all over face, swelling of tongue and legs.   . Ciprofloxacin Nausea And Vomiting    Other reaction(s): Abdominal Pain  . Depakene [Valproate Sodium] Hives    All over body   . Isoptin Sr [Verapamil Hcl Er] Cough  . Lisinopril Cough    Fatigue and cough  . Telmisartan-Hctz Cough     Medications:                                                                                                                         Current facility-administered medications:  .  Place/Maintain arterial line, , , Until Discontinued **AND** 0.9 %  sodium chloride infusion, ,  Intra-arterial, PRN, Javier Glazier, MD, Last Rate: 10 mL/hr at 10/21/15 2000 .  acetaminophen (TYLENOL) solution 650 mg, 650 mg, Oral, Q6H PRN, Chesley Mires, MD .  antiseptic oral rinse solution (CORINZ), 7 mL, Mouth Rinse, 10 times per day, Chesley Mires, MD, 7 mL at 10/21/15 1758 .  bisacodyl (DULCOLAX) suppository 10 mg, 10 mg, Rectal, Daily PRN, Chesley Mires, MD .  chlorhexidine gluconate (PERIDEX) 0.12 % solution 15 mL, 15 mL, Mouth Rinse, BID, Chesley Mires, MD, 15 mL at 10/21/15 2000 .  enoxaparin (LOVENOX) injection 40 mg, 40 mg, Subcutaneous, Q24H, Drema Dallas, DO, 40 mg at 10/21/15 0948 .  feeding supplement (VITAL AF 1.2 CAL) liquid 1,000 mL, 1,000 mL, Per Tube, Continuous, Brand Males, MD, Last Rate: 50 mL/hr at 10/21/15 2000, 1,000 mL at 10/21/15 2000 .  fentaNYL (SUBLIMAZE) injection 50 mcg, 50 mcg, Intravenous, Q2H PRN, Chesley Mires, MD, 50 mcg at 10/21/15 0133 .  hydrALAZINE (APRESOLINE) injection 5 mg, 5 mg, Intravenous, Q2H PRN, Ivor Costa, MD, 5 mg at 10/15/15 1422 .  insulin aspart (novoLOG) injection 0-15 Units, 0-15 Units, Subcutaneous, 6 times per day, Chesley Mires, MD, 2 Units at 10/21/15 1630 .  lacosamide (VIMPAT) 150 mg in sodium chloride 0.9 % 25 mL IVPB, 150 mg, Intravenous, Q12H, Vennessa Affinito Fuller Mandril, MD, 150 mg at 10/21/15 1028 .  levETIRAcetam (KEPPRA) 1,750 mg in sodium chloride 0.9 % 100 mL IVPB, 1,750 mg, Intravenous, Q12H, Imya Mance Fuller Mandril, MD, 1,750 mg at 10/21/15 1000 .  LORazepam (ATIVAN) injection 1-2 mg, 1-2 mg, Intravenous, Q4H PRN, Kara Mead V, MD .  nystatin-triamcinolone (MYCOLOG II) cream 1 application, 1 application, Topical, BID, Lauren D Bajbus, RPH, 1 application at [id number] .  [DISCONTINUED]  ondansetron (ZOFRAN) tablet 4 mg, 4 mg, Oral, Q6H PRN **OR** ondansetron (ZOFRAN) injection 4 mg, 4 mg, Intravenous, Q6H PRN, Ivor Costa, MD .  pantoprazole sodium (PROTONIX) 40 mg/20 mL oral suspension 40 mg, 40 mg, Per Tube, Q24H,  Chesley Mires, MD, 40 mg at 10/21/15 2000 .  PHENObarbital (LUMINAL) injection 65 mg, 65 mg, Intravenous, TID, Hawke Villalpando Fuller Mandril, MD, 65 mg at 10/21/15 1518 .  phenytoin (DILANTIN) injection 50 mg, 50 mg, Intravenous, Q12H, Holten Spano Fuller Mandril, MD, 50 mg at 10/21/15 0950 .  pregabalin (LYRICA) capsule 50 mg, 50 mg, Oral, TID, Gwenivere Hiraldo Fuller Mandril, MD, 50 mg at 10/21/15 1518 .  sennosides (SENOKOT) 8.8 MG/5ML syrup 5 mL, 5 mL, Per Tube, BID PRN, Chesley Mires, MD, 5 mL at 10/15/15 0923 .  simvastatin (ZOCOR) tablet 40 mg, 40 mg, Per Tube, q morning - 10a, Chesley Mires, MD, 40 mg at 10/21/15 0948 .  sodium chloride flush (NS) 0.9 % injection 10-40 mL, 10-40 mL, Intracatheter, Q12H, Raylene Miyamoto, MD, 10 mL at 10/21/15 0951 .  sodium chloride flush (NS) 0.9 % injection 10-40 mL, 10-40 mL, Intracatheter, PRN, Raylene Miyamoto, MD .  sodium chloride flush (NS) 0.9 % injection 3 mL, 3 mL, Intravenous, Q12H, Ivor Costa, MD, 3 mL at 10/20/15 1954 .  topiramate (TOPAMAX) tablet 100 mg, 100 mg, Per Tube, BID, Wynell Balloon, RPH, 100 mg at 10/21/15 0947 .  vitamin B-12 (CYANOCOBALAMIN) tablet 1,000 mcg, 1,000 mcg, Per Tube, Daily, Chesley Mires, MD, 1,000 mcg at 10/21/15 0948 .  vitamin C (ASCORBIC ACID) tablet 500 mg, 500 mg, Per Tube, Daily, Chesley Mires, MD, 500 mg at 10/21/15 0948 .  vitamin e oil OIL 200 Units, 200 Units, Per Tube, Daily, Chesley Mires, MD, 200 Units at 10/21/15 0948   Neurologic Examination:                                                                                                      Blood pressure 158/77, pulse 97, temperature 99 F (37.2 C), temperature source Axillary, resp. rate 15, height 5\' 3"  (1.6 m), weight 56.4 kg (124 lb 5.4 oz), SpO2 100 %.  Patient intubated, No gaze deviation. Preserved brain stem reflexes.  Withdraws to stimulus in all 4 extremities, opens eyes to stimulus. .   Lab Results: Basic Metabolic Panel:  Recent Labs Lab  10/15/15 0421 10/16/15 0831 10/17/15 0500 10/18/15 0425 10/19/15 0600 10/20/15 0531 10/21/15 0600  NA 137 137  --  139 138 138 141  K 3.6 3.2*  --  2.9* 3.6 3.3* 3.3*  CL 102 97*  --  103 105 104 106  CO2 26 28  --  28 25 25 26   GLUCOSE 128* 122*  --  122* 122* 125* 128*  BUN 15 19  --  21* 20 24* 27*  CREATININE 0.57 0.57  --  0.48 0.54 0.52 0.48  CALCIUM 8.5* 9.3  --  8.9 9.5 9.5 9.5  MG 1.7 1.8 1.7 1.8 1.6*  --   --   PHOS 2.9 2.7  2.6 2.5 2.7  --   --     Liver Function Tests:  Recent Labs Lab 10/17/15 0500  ALBUMIN 2.4*   No results for input(s): LIPASE, AMYLASE in the last 168 hours. No results for input(s): AMMONIA in the last 168 hours.  CBC:  Recent Labs Lab 10/16/15 0831 10/17/15 0500 10/18/15 0425 10/19/15 0600 10/21/15 0600  WBC 7.1 5.9 4.4 7.5 4.9  NEUTROABS 5.1 3.7 2.1 4.4  --   HGB 10.5* 9.6* 8.9* 9.9* 9.1*  HCT 32.5* 28.2* 27.9* 31.8* 29.4*  MCV 92.3 94.0 94.3 94.9 95.1  PLT 231 204 230 334 282    Cardiac Enzymes: No results for input(s): CKTOTAL, CKMB, CKMBINDEX, TROPONINI in the last 168 hours.  Lipid Panel:  Recent Labs Lab 10/15/15 1823 10/16/15 1411 10/18/15 1815  TRIG 59 69 99    CBG:  Recent Labs Lab 10/21/15 0329 10/21/15 0734 10/21/15 1127 10/21/15 1529 10/21/15 1933  GLUCAP 108* 120* 118* 128* 116*    Microbiology: Results for orders placed or performed during the hospital encounter of 10/08/15  Urine culture     Status: None   Collection Time: 10/09/15  1:43 AM  Result Value Ref Range Status   Specimen Description URINE, CATHETERIZED  Final   Special Requests NONE  Final   Culture   Final    >=100,000 COLONIES/mL DIPHTHEROIDS(CORYNEBACTERIUM SPECIES) Standardized susceptibility testing for this organism is not available.    Report Status 10/10/2015 FINAL  Final  Culture, blood (Routine X 2) w Reflex to ID Panel     Status: None   Collection Time: 10/09/15  5:30 AM  Result Value Ref Range Status   Specimen  Description BLOOD LEFT HAND  Final   Special Requests IN PEDIATRIC BOTTLE 1CC  Final   Culture NO GROWTH 5 DAYS  Final   Report Status 10/14/2015 FINAL  Final  Culture, blood (Routine X 2) w Reflex to ID Panel     Status: None   Collection Time: 10/09/15  5:38 AM  Result Value Ref Range Status   Specimen Description BLOOD LEFT ARM  Final   Special Requests IN PEDIATRIC BOTTLE [place]  Final   Culture NO GROWTH 5 DAYS  Final   Report Status 10/14/2015 FINAL  Final  MRSA PCR Screening     Status: None   Collection Time: 10/10/15 11:39 PM  Result Value Ref Range Status   MRSA by PCR NEGATIVE NEGATIVE Final    Comment:        The GeneXpert MRSA Assay (FDA approved for NASAL specimens only), is one component of a comprehensive MRSA colonization surveillance program. It is not intended to diagnose MRSA infection nor to guide or monitor treatment for MRSA infections.   CSF culture     Status: None   Collection Time: 10/11/15  1:33 PM  Result Value Ref Range Status   Specimen Description CSF  Final   Special Requests CSF NO 2  Final   Gram Stain   Final    CYTOSPIN SMEAR WBC PRESENT, PREDOMINANTLY PMN NO ORGANISMS SEEN    Culture NO GROWTH 3 DAYS  Final   Report Status 10/14/2015 FINAL  Final  Culture, respiratory (NON-Expectorated)     Status: None   Collection Time: 10/13/15  9:30 AM  Result Value Ref Range Status   Specimen Description ENDOTRACHEAL  Final   Special Requests NONE  Final   Gram Stain   Final    FEW WBC PRESENT,BOTH PMN AND MONONUCLEAR RARE SQUAMOUS  EPITHELIAL CELLS PRESENT NO ORGANISMS SEEN Performed at [facility]    Culture   Final    FEW YEAST CONSISTENT WITH CANDIDA SPECIES Performed at [facility]    Report Status 10/16/2015 FINAL  Final    Imaging: Dg Chest Port 1 View  10/20/2015  CLINICAL DATA:  Acute respiratory failure.  Subsequent encounter. EXAM: PORTABLE CHEST 1 VIEW COMPARISON:  10/18/2015 FINDINGS: Cardiac silhouette  normal in size and configuration. Normal mediastinal and hilar contours. Mild atelectasis at the lung bases, stable. Lungs otherwise clear. No pneumothorax. Endotracheal tube tip is stable projecting 2.5 cm above the carina. Right PICC and oral/nasogastric tube are also well positioned and stable. IMPRESSION: 1. No acute cardiopulmonary disease. 2. Stable support apparatus.  No change from prior study. Electronically Signed   By: Lajean Manes M.D.   On: 10/20/2015 08:48   Dg Chest Port 1 View  10/18/2015  CLINICAL DATA:  Evaluate endotracheal tube placement EXAM: PORTABLE CHEST 1 VIEW COMPARISON:  Portable chest x-ray of 10/17/2015 FINDINGS: The tip of the endotracheal tube is approximately 2.5 cm above the carina. The lungs appear well aerated. Right central venous line tip overlies the lower SVC. No pneumothorax is seen. The heart is within normal limits in size. IMPRESSION: 1. Tip of endotracheal tube 2.5 cm above the carina. 2. Right central venous line tip overlies the lower SVC. Electronically Signed   By: Ivar Drape M.D.   On: 10/18/2015 08:08    Assessment and plan:   ALYSE SCHNAUTZ is an 82 y.o. female patient with resolved status epilepticus as described in my prior notes. LTM EEG in past 24 hours showed  no clinical or subclinical seizures present. Background activities are marked by delta and theta slowing, reactive to external stimuli. Left hemispheric periodic lateralized epileptiform discharges are no longer present . Occasional left hemispheric sharp waves were present throughout the recording with maximum negativity in the left post temporal cortex. Overall, it demonstrated an improvement with resolution of left hemispheric periodic lateralized epileptiform discharges. Occasional left post temporal sharp wave and spikes were present suggestive of cortical irritability in the left hemisphere .Her seizure mediations are being slowly weaned down, and EEG remains stable, no seizures.  She is off sedation with propofol and versed gtt. Recommend further reducing phenobarbital dose to 65 mg TID, and dilantin dose to 50 mg Q12h. IF she remains stable over the next day, may d/c dilantin. Continue keppra, vimpat, lyrica, and topomax same doses for now.  Will follow up.

## 2015-10-22 LAB — GLUCOSE, CAPILLARY
GLUCOSE-CAPILLARY: 91 mg/dL (ref 65–99)
GLUCOSE-CAPILLARY: 105 mg/dL — AB (ref 65–99)
GLUCOSE-CAPILLARY: 99 mg/dL (ref 65–99)
GLUCOSE-CAPILLARY: 111 mg/dL — AB (ref 65–99)
GLUCOSE-CAPILLARY: 114 mg/dL — AB (ref 65–99)
Glucose-Capillary: 108 mg/dL — ABNORMAL HIGH (ref 65–99)

## 2015-10-22 LAB — CBC
HEMATOCRIT: 28.3 % — AB (ref 36.0–46.0)
HEMOGLOBIN: 8.8 g/dL — AB (ref 12.0–15.0)
MCH: 29.6 pg (ref 26.0–34.0)
MCHC: 31.1 g/dL (ref 30.0–36.0)
MCV: 95.3 fL (ref 78.0–100.0)
Platelets: 283 10*3/uL (ref 150–400)
RBC: 2.97 MIL/uL — ABNORMAL LOW (ref 3.87–5.11)
RDW: 13.6 % (ref 11.5–15.5)
WBC: 4.9 10*3/uL (ref 4.0–10.5)

## 2015-10-22 LAB — BASIC METABOLIC PANEL
ANION GAP: 7 (ref 5–15)
BUN: 23 mg/dL — AB (ref 6–20)
CHLORIDE: 107 mmol/L (ref 101–111)
CO2: 26 mmol/L (ref 22–32)
Calcium: 9.5 mg/dL (ref 8.9–10.3)
Creatinine, Ser: 0.56 mg/dL (ref 0.44–1.00)
GFR calc Af Amer: >60 mL/min (ref >60)
GLUCOSE: 118 mg/dL — AB (ref 65–99)
POTASSIUM: 3.3 mmol/L — AB (ref 3.5–5.1)
SODIUM: 140 mmol/L (ref 135–145)

## 2015-10-22 LAB — PHENYTOIN LEVEL, TOTAL: Phenytoin Lvl: 12 ug/mL (ref 10.0–20.0)

## 2015-10-22 MED ORDER — POTASSIUM CHLORIDE 20 MEQ/15ML (10%) PO SOLN
20.0000 meq | ORAL | Status: AC
  Administered 2015-10-22 (×2): 20 meq
  Filled 2015-10-22 (×2): qty 15

## 2015-10-22 NOTE — Progress Notes (Signed)
[facility] ADULT ICU REPLACEMENT PROTOCOL FOR AM LAB REPLACEMENT ONLY  The patient does apply for the [facility] Adult ICU Electrolyte Replacment Protocol based on the criteria listed below:   1. Is GFR >/= 40 ml/min? Yes.    Patient's GFR today is >60 2. Is urine output >/= 0.5 ml/kg/hr for the last 6 hours? Yes.   Patient's UOP is 0.9 ml/kg/hr 3. Is BUN < 60 mg/dL? Yes.    Patient's BUN today is 23 4. Abnormal electrolyte(s): K+3.3 5. Ordered repletion with: protocol 6. If a panic level lab has been reported, has the CCM MD in charge been notified? No..   Physician:  Joni Reining Hilliard 10/22/2015 5:58 AM

## 2015-10-22 NOTE — Progress Notes (Signed)
RN called d/t pt w/ "apnea back-up" alarm on vent x 2 episodes.  Pt was placed back on full vent support.  VSS, no distress noted.

## 2015-10-22 NOTE — Progress Notes (Signed)
PULMONARY / CRITICAL CARE MEDICINE   Name: Kathryn [name] MRN: [id number] DOB: 1931/07/15    ADMISSION DATE:  10/08/2015 CONSULTATION DATE:  10/12/2015  REFERRING MD:  Dr. Janann Colonel, neurology  CHIEF COMPLAINT:  Seizures.  HISTORY OF PRESENT ILLNESS:   82 yo female brought to ER due to confusion after falling.  She was also having visual hallucinations.  She was noted to have seizure on 2/08 and neurology consulted.  She was also noted to have fever.  She had LP that was unrevealing.  She was started on abx for possible UTI, and acyclovir for possible encephalitis.  Her seizures persistent, and she was transferred to ICU for diprivan coma and intubation.  CULTURES: 2/07 Urine >> diphtheroids 2/07 Blood >>NGTD 2/09 CSF >>NGTD  ANTIBIOTICS: 2/06 Levaquin >>off 2/09 Acyclovir >> 2/11 2/10 Vanco>>off 2/10 ceftaz>> off 2/11 sputum>> candida  Tubes 2/10 ETT >>   STUDIES and events:  2/06 Admit 2/08 Noted to have seizure, neuro consulted 2/06 CT head >> atrophy 2/07 MRI brain >> remote basal ganglia and corona radiata infarcts b/l, remote Rt thalamus and posterior limb internal capsule infarcts, global atrophy 2/08 EEG >> lt temporal region focal seizures, lt hemisphere slowing 2/09 LP >> 140 RBC, 2 WBC, protein 38, glucose 51 2/10 Status epilepticus >> to ICU, diprivan coma  2./12 - Sedated on vent, szs overnight  2/13 - afebrile with ongoing c-eeg and diprivan 10/16/15 - c-EEG off. PEr tech - PLEDS +. c-versed gtt started by neuro per RN.  MRI - New signal abnormality in the left thalamus  2/15 EEG - PLEDS 2/17 - EEG - PLEDS improved 2/18 PLEDS resolved  SUBJECTIVE/OVERNIGHT/INTERVAL HX Continuous EEG showed resolution seizure activity, some residual L hemisphere irritability  VITAL SIGNS: BP 158/70 mmHg  Pulse 92  Temp(Src) 98.1 F (36.7 C) (Axillary)  Resp 15  Ht 5\' 3"  (1.6 m)  Wt 56.4 kg (124 lb 5.4 oz)  BMI 22.03 kg/m2  SpO2 100%  HEMODYNAMICS:    VENTILATOR  SETTINGS: Vent Mode:  [-] PRVC FiO2 (%):  [30 %] 30 % Set Rate:  [10 bmp] 10 bmp Vt Set:  [450 mL] 450 mL PEEP:  [5 cmH20] 5 cmH20 Pressure Support:  [8 cmH20] 8 cmH20 Plateau Pressure:  [13 cmH20-15 cmH20] 13 cmH20  INTAKE / OUTPUT: I/O last 3 completed shifts: In: 2385 [I.V.:110; NG/GT:1810; IV Piggyback:465] Out: 2645 [Urine:2645]  PHYSICAL EXAMINATION: General: chr  Ill appearing Neuro:  Comatose HEENT: No JVD Cardiovascular:  Regular, no murmur Lungs:  B/l rhonchi Abdomen:  Soft, nontender Musculoskeletal:  No edema Skin:  No rashes  LABS:  PULMONARY No results for input(s): PHART, PCO2ART, PO2ART, HCO3, TCO2, O2SAT in the last 168 hours.  Invalid input(s): PCO2, PO2  CBC  Recent Labs Lab 10/19/15 0600 10/21/15 0600 10/22/15 0440  HGB 9.9* 9.1* 8.8*  HCT 31.8* 29.4* 28.3*  WBC 7.5 4.9 4.9  PLT 334 282 283    COAGULATION No results for input(s): INR in the last 168 hours.  CARDIAC  No results for input(s): TROPONINI in the last 168 hours. No results for input(s): PROBNP in the last 168 hours.   CHEMISTRY  Recent Labs Lab 10/16/15 0831 10/17/15 0500 10/18/15 0425 10/19/15 0600 10/20/15 0531 10/21/15 0600 10/22/15 0440  NA 137  --  139 138 138 141 140  K 3.2*  --  2.9* 3.6 3.3* 3.3* 3.3*  CL 97*  --  103 105 104 106 107  CO2 28  --  28 25  25 26 26   GLUCOSE 122*  --  122* 122* 125* 128* 118*  BUN 19  --  21* 20 24* 27* 23*  CREATININE 0.57  --  0.48 0.54 0.52 0.48 0.56  CALCIUM 9.3  --  8.9 9.5 9.5 9.5 9.5  MG 1.8 1.7 1.8 1.6*  --   --   --   PHOS 2.7 2.6 2.5 2.7  --   --   --    Estimated Creatinine Clearance: 44.1 mL/min (by C-G formula based on Cr of 0.56).   LIVER  Recent Labs Lab 10/17/15 0500  ALBUMIN 2.4*     INFECTIOUS No results for input(s): LATICACIDVEN, PROCALCITON in the last 168 hours.   ENDOCRINE CBG (last 3)   Recent Labs  10/21/15 1933 10/21/15 2320 10/22/15 0318  GLUCAP 116* 109* 91    No results  found.   DISCUSSION: 82 yo female with acute encephalopathy and status epilepticus.  She has hx of CVA's and polypharmacy.  She required intubation for diprivan coma to control seizures.  2/12 recurrent szs with diprivan dosed decreased. Restarted on heavy sedation and anti-epileptics. Seizure activity resolved on cEEG  ASSESSMENT / PLAN:  PULMONARY A: Acute respiratory failure in setting of status epilepticus.  P:   Full vent support Hold SBTs until seizures resolved and able to lighten medical coma  CARDIOVASCULAR A:  Hx of HTN, HLD. P:  Continue zocor  RENAL  A:   Hypomag hypokalemia P:   Replace electrolytes as needed  GASTROINTESTINAL A:   Nutrition. Chronic abdominal pain. P:   Tube feeds while on vent Protonix for SUP  HEMATOLOGIC A:   No acute issues. P:  F/u CBC Lovenox for DVT prevention  INFECTIOUS  A:   No e/o Encephalitis. P:   Monitor off abx  ENDOCRINE A:   No acute issues. P:   Monitor CBGs  NEUROLOGIC A:   Acute encephalopathy 2nd to status epilepticus. Hx of anxiety, depression on chronic benzo's. Hx of CVA. Breakthrough szs when diprivan decreased  - off c-EEG, PLEDS have resolved P:   AEDs being tapered  per neurology - dilantin, topamax, keppra, vimpat Off versed & Propofol gtt    GLOBAL 2/16 updated d in law; no family present at bedside 2/20 Per neuro , Waiting for her to wake up as AEDs tapered  Independent CC time 32 minutes   Baltazar Apo, MD, PhD 10/22/2015, 9:08 AM [facility] [phone] or if no answer [phone]

## 2015-10-22 NOTE — Progress Notes (Signed)
Spoke w/ RN d/t pt vent weaning.  I spoke w/ MD this morning re: pt weaning on vent- MD aware, no new RT orders received.  Pt appears to be tol well currently.

## 2015-10-22 NOTE — Progress Notes (Addendum)
Subjective: No further seizures. Anticonvulsants are being tapered. Slightly improved responsiveness per nursing today.   Objective: Current vital signs: BP 147/64 mmHg  Pulse 93  Temp(Src) 99.1 F (37.3 C) (Axillary)  Resp 20  Ht 5\' 3"  (1.6 m)  Wt 56.4 kg (124 lb 5.4 oz)  BMI 22.03 kg/m2  SpO2 100% Vital signs in last 24 hours: Temp:  [97.6 F (36.4 C)-99.4 F (37.4 C)] 99.1 F (37.3 C) (02/20 1600) Pulse Rate:  [82-98] 93 (02/20 1800) Resp:  [0-28] 20 (02/20 1800) BP: (135-170)/(58-90) 147/64 mmHg (02/20 1800) SpO2:  [100 %] 100 % (02/20 1800) FiO2 (%):  [30 %] 30 % (02/20 1800) Weight:  [56.4 kg (124 lb 5.4 oz)] 56.4 kg (124 lb 5.4 oz) (02/20 0324)  Intake/Output from previous day: 02/19 0701 - 02/20 0700 In: 1567.5 [I.V.:110; NG/GT:1150; IV Piggyback:307.5] Out: 1835 [Urine:1835] Intake/Output this shift: Total I/O In: 930 [I.V.:110; NG/GT:670; IV Piggyback:150] Out: 1200 [Urine:1200] Nutritional status: Diet NPO time specified  Neurologic Exam: Ment: Obtunded. Does not awaken to noxious stimuli. Briefly moves extremities non-purposefully to noxious. No spontaneous eye opening.  CN: PERRL. Eyes conjugate. No nystagmus. VII - No asymmetry noted.  Motor/Sensory: Decreased tone throughout. Minimal 2/5 motor responses to noxious stimuli.  Reflexes: Hypoactive x 4. Toes downgoing.   Lab Results: Basic Metabolic Panel:  Recent Labs Lab 10/16/15 0831 10/17/15 0500 10/18/15 0425 10/19/15 0600 10/20/15 0531 10/21/15 0600 10/22/15 0440  NA 137  --  139 138 138 141 140  K 3.2*  --  2.9* 3.6 3.3* 3.3* 3.3*  CL 97*  --  103 105 104 106 107  CO2 28  --  28 25 25 26 26   GLUCOSE 122*  --  122* 122* 125* 128* 118*  BUN 19  --  21* 20 24* 27* 23*  CREATININE 0.57  --  0.48 0.54 0.52 0.48 0.56  CALCIUM 9.3  --  8.9 9.5 9.5 9.5 9.5  MG 1.8 1.7 1.8 1.6*  --   --   --   PHOS 2.7 2.6 2.5 2.7  --   --   --     Liver Function Tests:  Recent Labs Lab 10/17/15 0500   ALBUMIN 2.4*   No results for input(s): LIPASE, AMYLASE in the last 168 hours. No results for input(s): AMMONIA in the last 168 hours.  CBC:  Recent Labs Lab 10/16/15 0831 10/17/15 0500 10/18/15 0425 10/19/15 0600 10/21/15 0600 10/22/15 0440  WBC 7.1 5.9 4.4 7.5 4.9 4.9  NEUTROABS 5.1 3.7 2.1 4.4  --   --   HGB 10.5* 9.6* 8.9* 9.9* 9.1* 8.8*  HCT 32.5* 28.2* 27.9* 31.8* 29.4* 28.3*  MCV 92.3 94.0 94.3 94.9 95.1 95.3  PLT 231 204 230 334 282 283    Cardiac Enzymes: No results for input(s): CKTOTAL, CKMB, CKMBINDEX, TROPONINI in the last 168 hours.  Lipid Panel:  Recent Labs Lab 10/16/15 1411 10/18/15 1815  TRIG 69 99    CBG:  Recent Labs Lab 10/21/15 2320 10/22/15 0318 10/22/15 0803 10/22/15 1150 10/22/15 1549  GLUCAP 109* 91 105* 99 108*    Microbiology: Results for orders placed or performed during the hospital encounter of 10/08/15  Urine culture     Status: None   Collection Time: 10/09/15  1:43 AM  Result Value Ref Range Status   Specimen Description URINE, CATHETERIZED  Final   Special Requests NONE  Final   Culture   Final    >=100,000 COLONIES/mL DIPHTHEROIDS(CORYNEBACTERIUM SPECIES) Standardized susceptibility  testing for this organism is not available.    Report Status 10/10/2015 FINAL  Final  Culture, blood (Routine X 2) w Reflex to ID Panel     Status: None   Collection Time: 10/09/15  5:30 AM  Result Value Ref Range Status   Specimen Description BLOOD LEFT HAND  Final   Special Requests IN PEDIATRIC BOTTLE 1CC  Final   Culture NO GROWTH 5 DAYS  Final   Report Status 10/14/2015 FINAL  Final  Culture, blood (Routine X 2) w Reflex to ID Panel     Status: None   Collection Time: 10/09/15  5:38 AM  Result Value Ref Range Status   Specimen Description BLOOD LEFT ARM  Final   Special Requests IN PEDIATRIC BOTTLE [place]  Final   Culture NO GROWTH 5 DAYS  Final   Report Status 10/14/2015 FINAL  Final  MRSA PCR Screening     Status: None    Collection Time: 10/10/15 11:39 PM  Result Value Ref Range Status   MRSA by PCR NEGATIVE NEGATIVE Final    Comment:        The GeneXpert MRSA Assay (FDA approved for NASAL specimens only), is one component of a comprehensive MRSA colonization surveillance program. It is not intended to diagnose MRSA infection nor to guide or monitor treatment for MRSA infections.   CSF culture     Status: None   Collection Time: 10/11/15  1:33 PM  Result Value Ref Range Status   Specimen Description CSF  Final   Special Requests CSF NO 2  Final   Gram Stain   Final    CYTOSPIN SMEAR WBC PRESENT, PREDOMINANTLY PMN NO ORGANISMS SEEN    Culture NO GROWTH 3 DAYS  Final   Report Status 10/14/2015 FINAL  Final  Culture, respiratory (NON-Expectorated)     Status: None   Collection Time: 10/13/15  9:30 AM  Result Value Ref Range Status   Specimen Description ENDOTRACHEAL  Final   Special Requests NONE  Final   Gram Stain   Final    FEW WBC PRESENT,BOTH PMN AND MONONUCLEAR RARE SQUAMOUS EPITHELIAL CELLS PRESENT NO ORGANISMS SEEN Performed at [facility]    Culture   Final    FEW YEAST CONSISTENT WITH CANDIDA SPECIES Performed at [facility]    Report Status 10/16/2015 FINAL  Final    Coagulation Studies: No results for input(s): LABPROT, INR in the last 72 hours.  Imaging: No results found.  Medications: Medication list has been reviewed.   Assessment/Plan:  1. Kathryn [name] is an 82 y.o. female patient who was admitted to the ICU since 10/09/15, with prolonged status epilepticus arising from the left insular cortex, on long-term EEG monitoring. Anticonvulsant regimen achieved resolution of status, with subsequent EEG showing improvement compared to previous recordings, but with left hemispheric periodic lateralized epileptiform discharges persisting, but becoming less developed, less organized and occurring less frequently. Versed and propofol have been weaned off. .   2. Continuous EEG has been discontinued. PLEDS have resolved.  3. Phenytoin level today therapeutic at 12.  4. Current anticonvulsant regimen (2/20) consists of Vimpat 100 mg BID, Keppra 1500 mg BID, phenobarbital 32.5 mg TID, Dilantin 50 mg BID and Topamax 100 mg BID.  5. Most recent EEG from 2/19 showed no clinical or subclinical seizures. Background activities were marked by delta and theta slowing, reactive to external stimuli. Left hemispheric periodic lateralized epileptiform discharges were no longer present . Occasional left hemispheric sharp waves  were present throughout the recording with maximum negativity in the left post temporal cortex.    Recommendations:  1. Continue Vimpat, Keppra, Dilantin, phenobarbital and Topamax at current doses.  2. Pregabalin can be discontinued.  3. Phenobarbital would be the best next candidate for tapering as it is the most sedating. This can be gradually tapered off after Pregabalin has been stopped if repeat EEG on day after stopping Pregabalin is negative for electrographic seizures. Would aim for stopping Pregabalin tonight or tomorrow and repeating EEG on 2/22.    35 minutes spent in the evaluation of this critically ill patient.   Kerney Elbe, MD 10/22/2015, 6:37 PM

## 2015-10-22 NOTE — Clinical Social Work Note (Signed)
Clinical Social Worker continuing to follow patient and family for support and discharge planning needs. Patient remains on the ventilator at this time. CSW remains available for support and to facilitate patient discharge needs once medically stable.  Barbette Or, [place]

## 2015-10-22 NOTE — Care Management Important Message (Signed)
Important Message  Patient Details  Name: Kathryn [name] MRN: [id number] Date of Birth: [birth year]   Medicare Important Message Given:  Yes    Loann Quill 10/22/2015, 11:17 AM

## 2015-10-23 ENCOUNTER — Inpatient Hospital Stay (HOSPITAL_COMMUNITY): Payer: Medicare Other

## 2015-10-23 LAB — BASIC METABOLIC PANEL
Anion gap: 9 (ref 5–15)
BUN: 26 mg/dL — ABNORMAL HIGH (ref 6–20)
CALCIUM: 9.2 mg/dL (ref 8.9–10.3)
CHLORIDE: 107 mmol/L (ref 101–111)
CO2: 25 mmol/L (ref 22–32)
CREATININE: 0.55 mg/dL (ref 0.44–1.00)
GFR calc non Af Amer: >60 mL/min (ref >60)
GLUCOSE: 123 mg/dL — AB (ref 65–99)
Potassium: 3.3 mmol/L — ABNORMAL LOW (ref 3.5–5.1)
Sodium: 141 mmol/L (ref 135–145)

## 2015-10-23 LAB — GLUCOSE, CAPILLARY
GLUCOSE-CAPILLARY: 111 mg/dL — AB (ref 65–99)
GLUCOSE-CAPILLARY: 124 mg/dL — AB (ref 65–99)
GLUCOSE-CAPILLARY: 111 mg/dL — AB (ref 65–99)
Glucose-Capillary: 111 mg/dL — ABNORMAL HIGH (ref 65–99)
Glucose-Capillary: 117 mg/dL — ABNORMAL HIGH (ref 65–99)
Glucose-Capillary: 113 mg/dL — ABNORMAL HIGH (ref 65–99)

## 2015-10-23 LAB — PHOSPHORUS: PHOSPHORUS: 2.7 mg/dL (ref 2.5–4.6)

## 2015-10-23 LAB — CBC
HEMATOCRIT: 25.7 % — AB (ref 36.0–46.0)
HEMOGLOBIN: 8.2 g/dL — AB (ref 12.0–15.0)
MCH: 30.7 pg (ref 26.0–34.0)
MCHC: 31.9 g/dL (ref 30.0–36.0)
MCV: 96.3 fL (ref 78.0–100.0)
Platelets: 257 10*3/uL (ref 150–400)
RBC: 2.67 MIL/uL — ABNORMAL LOW (ref 3.87–5.11)
RDW: 13.7 % (ref 11.5–15.5)
WBC: 5.8 10*3/uL (ref 4.0–10.5)

## 2015-10-23 LAB — MAGNESIUM: Magnesium: 1.6 mg/dL — ABNORMAL LOW (ref 1.7–2.4)

## 2015-10-23 MED ORDER — POTASSIUM CHLORIDE 20 MEQ/15ML (10%) PO SOLN
20.0000 meq | ORAL | Status: AC
  Administered 2015-10-23 (×2): 20 meq
  Filled 2015-10-23 (×2): qty 15

## 2015-10-23 NOTE — Progress Notes (Signed)
Pt remains on ventilator at this time.  Neuro tapering anti-seizure meds slowly.  Some subtle changes in responsiveness noted by nursing staff, per notes.  Will continue to follow progress.    Reinaldo Raddle, RN, BSN  Trauma/Neuro ICU Case Manager [phone]

## 2015-10-23 NOTE — Progress Notes (Signed)
PULMONARY / CRITICAL CARE MEDICINE   Name: Kathryn [name] MRN: [id number] DOB: 1931/08/26    ADMISSION DATE:  10/08/2015 CONSULTATION DATE:  10/12/2015  REFERRING MD:  Dr. Janann Colonel, neurology  CHIEF COMPLAINT:  Seizures.  HISTORY OF PRESENT ILLNESS:   82 yo female brought to ER due to confusion after falling.  She was also having visual hallucinations.  She was noted to have seizure on 2/08 and neurology consulted.  She was also noted to have fever.  She had LP that was unrevealing.  She was started on abx for possible UTI, and acyclovir for possible encephalitis.  Her seizures persistent, and she was transferred to ICU for diprivan coma and intubation.  CULTURES: 2/07 Urine >> diphtheroids 2/07 Blood >>NGTD 2/09 CSF >>NGTD  ANTIBIOTICS: 2/06 Levaquin >>off 2/09 Acyclovir >> 2/11 2/10 Vanco>>off 2/10 ceftaz>> off 2/11 sputum>> candida  Tubes 2/10 ETT >>   STUDIES and events:  2/06 Admit 2/08 Noted to have seizure, neuro consulted 2/06 CT head >> atrophy 2/07 MRI brain >> remote basal ganglia and corona radiata infarcts b/l, remote Rt thalamus and posterior limb internal capsule infarcts, global atrophy 2/08 EEG >> lt temporal region focal seizures, lt hemisphere slowing 2/09 LP >> 140 RBC, 2 WBC, protein 38, glucose 51 2/10 Status epilepticus >> to ICU, diprivan coma  2./12 - Sedated on vent, szs overnight  2/13 - afebrile with ongoing c-eeg and diprivan 10/16/15 - c-EEG off. PEr tech - PLEDS +. c-versed gtt started by neuro per RN.  MRI - New signal abnormality in the left thalamus  2/15 EEG - PLEDS 2/17 - EEG - PLEDS improved 2/18 PLEDS resolved  SUBJECTIVE/OVERNIGHT/INTERVAL HX Propofol has been off > 24h Some UE movement and grimace w stim   VITAL SIGNS: BP 160/70 mmHg  Pulse 97  Temp(Src) 98.6 F (37 C) (Axillary)  Resp 22  Ht 5\' 3"  (1.6 m)  Wt 54.6 kg (120 lb 5.9 oz)  BMI 21.33 kg/m2  SpO2 100%  HEMODYNAMICS:    VENTILATOR SETTINGS: Vent Mode:  [-]  PSV;CPAP FiO2 (%):  [30 %] 30 % Set Rate:  [10 bmp] 10 bmp Vt Set:  [450 mL] 450 mL PEEP:  [5 cmH20] 5 cmH20 Pressure Support:  [8 cmH20-12 cmH20] 8 cmH20 Plateau Pressure:  [12 cmH20-16 cmH20] 16 cmH20  INTAKE / OUTPUT: I/O last 3 completed shifts: In: 2600 [I.V.:330; NG/GT:1820; IV Piggyback:450] Out: 2900 [Urine:2900]  PHYSICAL EXAMINATION: General: chr  Ill appearing Neuro:  Comatose HEENT: No JVD Cardiovascular:  Regular, no murmur Lungs:  B/l rhonchi Abdomen:  Soft, nontender Musculoskeletal:  No edema Skin:  No rashes  LABS:  PULMONARY No results for input(s): PHART, PCO2ART, PO2ART, HCO3, TCO2, O2SAT in the last 168 hours.  Invalid input(s): PCO2, PO2  CBC  Recent Labs Lab 10/21/15 0600 10/22/15 0440 10/23/15 0521  HGB 9.1* 8.8* 8.2*  HCT 29.4* 28.3* 25.7*  WBC 4.9 4.9 5.8  PLT 282 283 257    COAGULATION No results for input(s): INR in the last 168 hours.  CARDIAC  No results for input(s): TROPONINI in the last 168 hours. No results for input(s): PROBNP in the last 168 hours.   CHEMISTRY  Recent Labs Lab 10/17/15 0500  10/18/15 0425 10/19/15 0600 10/20/15 0531 10/21/15 0600 10/22/15 0440 10/23/15 0521  NA  --   < > 139 138 138 141 140 141  K  --   < > 2.9* 3.6 3.3* 3.3* 3.3* 3.3*  CL  --   < >  103 105 104 106 107 107  CO2  --   < > 28 25 25 26 26 25   GLUCOSE  --   < > 122* 122* 125* 128* 118* 123*  BUN  --   < > 21* 20 24* 27* 23* 26*  CREATININE  --   < > 0.48 0.54 0.52 0.48 0.56 0.55  CALCIUM  --   < > 8.9 9.5 9.5 9.5 9.5 9.2  MG 1.7  --  1.8 1.6*  --   --   --  1.6*  PHOS 2.6  --  2.5 2.7  --   --   --  2.7  < > = values in this interval not displayed. Estimated Creatinine Clearance: 44.1 mL/min (by C-G formula based on Cr of 0.55).   LIVER  Recent Labs Lab 10/17/15 0500  ALBUMIN 2.4*     INFECTIOUS No results for input(s): LATICACIDVEN, PROCALCITON in the last 168 hours.   ENDOCRINE CBG (last 3)   Recent Labs   10/22/15 1933 10/22/15 2310 10/23/15 0321  GLUCAP 111* 114* 111*    Dg Chest Port 1 View  10/23/2015  CLINICAL DATA:  Acute respiratory failure, intubated patient, acute encephalopathy. EXAM: PORTABLE CHEST 1 VIEW COMPARISON:  Portable chest x-ray of October 20, 2015 FINDINGS: The lungs are adequately inflated. There is no focal infiltrate. There is no pleural effusion or pneumothorax. The heart and pulmonary vascularity are normal. The endotracheal tube tip lies approximately 6.6 cm above the carina. The esophagogastric tube tip projects below the inferior margin of the image. The PICC line tip projects over the midportion of the SVC. The observed bony thorax is unremarkable. IMPRESSION: There is no acute cardiopulmonary abnormality. The support tubes are in reasonable position. Electronically Signed   By: David  Martinique M.D.   On: 10/23/2015 07:38     DISCUSSION: 82 yo female with acute encephalopathy and status epilepticus.  She has hx of CVA's and polypharmacy.  She required intubation for diprivan coma to control seizures.  2/12 recurrent szs with diprivan dosed decreased. Restarted on heavy sedation and anti-epileptics. Seizure activity resolved on cEEG  ASSESSMENT / PLAN:  PULMONARY A: Acute respiratory failure in setting of status epilepticus.  P:   Full vent support Hold SBTs until seizures resolved and able to lighten medical coma  CARDIOVASCULAR A:  Hx of HTN, HLD. P:  Continue zocor  RENAL  A:   Hypomag hypokalemia P:   Replace electrolytes as needed  GASTROINTESTINAL A:   Nutrition. Chronic abdominal pain. P:   Tube feeds while on vent Protonix for SUP  HEMATOLOGIC A:   No acute issues. P:  F/u CBC Lovenox for DVT prevention  INFECTIOUS  A:   No e/o Encephalitis. P:   Monitor off abx  ENDOCRINE A:   No acute issues. P:   Monitor CBGs  NEUROLOGIC A:   Acute encephalopathy 2nd to status epilepticus. Hx of anxiety, depression on chronic  benzo's. Hx of CVA. Breakthrough szs when diprivan decreased  - off c-EEG, PLEDS have resolved P:   AEDs being tapered  per neurology - dilantin, topamax, keppra, vimpat Plan is to stop pregabalin 2/21, probably taper down phenobarb next.  Off versed & Propofol gtt     GLOBAL 2/16 updated d in law; no family present at bedside 2/21 Per neuro , Waiting for her to wake up as AEDs tapered  Independent CC time 32 minutes   Baltazar Apo, MD, PhD 10/23/2015, 9:10 AM [facility] [phone] or if no answer [phone]

## 2015-10-24 DIAGNOSIS — J9602 Acute respiratory failure with hypercapnia: Secondary | ICD-10-CM

## 2015-10-24 DIAGNOSIS — J9600 Acute respiratory failure, unspecified whether with hypoxia or hypercapnia: Secondary | ICD-10-CM | POA: Insufficient documentation

## 2015-10-24 LAB — GLUCOSE, CAPILLARY
GLUCOSE-CAPILLARY: 113 mg/dL — AB (ref 65–99)
GLUCOSE-CAPILLARY: 92 mg/dL (ref 65–99)
Glucose-Capillary: 110 mg/dL — ABNORMAL HIGH (ref 65–99)
Glucose-Capillary: 135 mg/dL — ABNORMAL HIGH (ref 65–99)
Glucose-Capillary: 107 mg/dL — ABNORMAL HIGH (ref 65–99)

## 2015-10-24 LAB — CBC WITH DIFFERENTIAL/PLATELET
BASOS ABS: 0 10*3/uL (ref 0.0–0.1)
Basophils Relative: 0 %
Eosinophils Absolute: 0.2 10*3/uL (ref 0.0–0.7)
Eosinophils Relative: 3 %
HEMATOCRIT: 29 % — AB (ref 36.0–46.0)
Hemoglobin: 9.6 g/dL — ABNORMAL LOW (ref 12.0–15.0)
LYMPHS ABS: 0.8 10*3/uL (ref 0.7–4.0)
LYMPHS PCT: 13 %
MCH: 31.5 pg (ref 26.0–34.0)
MCHC: 33.1 g/dL (ref 30.0–36.0)
MCV: 95.1 fL (ref 78.0–100.0)
MONO ABS: 0.6 10*3/uL (ref 0.1–1.0)
Monocytes Relative: 8 %
NEUTROS ABS: 5.1 10*3/uL (ref 1.7–7.7)
Neutrophils Relative %: 76 %
Platelets: 279 10*3/uL (ref 150–400)
RBC: 3.05 MIL/uL — AB (ref 3.87–5.11)
RDW: 13.6 % (ref 11.5–15.5)
WBC: 6.7 10*3/uL (ref 4.0–10.5)

## 2015-10-24 MED ORDER — DEXTROSE 5 % IV SOLN
4.0000 g | Freq: Once | INTRAVENOUS | Status: DC
  Filled 2015-10-24: qty 8

## 2015-10-24 MED ORDER — MAGNESIUM SULFATE 4 GM/100ML IV SOLN
4.0000 g | Freq: Once | INTRAVENOUS | Status: AC
  Administered 2015-10-24: 4 g via INTRAVENOUS
  Filled 2015-10-24: qty 100

## 2015-10-24 MED ORDER — POTASSIUM CHLORIDE 20 MEQ/15ML (10%) PO SOLN
40.0000 meq | Freq: Once | ORAL | Status: AC
  Administered 2015-10-24: 40 meq
  Filled 2015-10-24: qty 30

## 2015-10-24 NOTE — Progress Notes (Signed)
PULMONARY / CRITICAL CARE MEDICINE   Name: Kathryn [name] MRN: [id number] DOB: [birth year]    ADMISSION DATE:  10/08/2015 CONSULTATION DATE:  10/12/2015  REFERRING MD:  Dr. Janann Colonel, neurology  CHIEF COMPLAINT:  Seizures.  HISTORY OF PRESENT ILLNESS:   82 yo female brought to ER due to confusion after falling.  She was also having visual hallucinations.  She was noted to have seizure on 2/08 and neurology consulted.  She was also noted to have fever.  She had LP that was unrevealing.  She was started on abx for possible UTI, and acyclovir for possible encephalitis.  Her seizures persistent, and she was transferred to ICU for diprivan coma and intubation.  CULTURES: 2/07 Urine >> diphtheroids 2/07 Blood >>NGTD 2/09 CSF >>NGTD  ANTIBIOTICS: 2/06 Levaquin >>off 2/09 Acyclovir >> 2/11 2/10 Vanco>>off 2/10 ceftaz>> off 2/11 sputum>> candida  Tubes 2/10 ETT >>   STUDIES and events:  2/06 Admit 2/08 Noted to have seizure, neuro consulted 2/06 CT head >> atrophy 2/07 MRI brain >> remote basal ganglia and corona radiata infarcts b/l, remote Rt thalamus and posterior limb internal capsule infarcts, global atrophy 2/08 EEG >> lt temporal region focal seizures, lt hemisphere slowing 2/09 LP >> 140 RBC, 2 WBC, protein 38, glucose 51 2/10 Status epilepticus >> to ICU, diprivan coma  2./12 - Sedated on vent, szs overnight  2/13 - afebrile with ongoing c-eeg and diprivan 10/16/15 - c-EEG off. PEr tech - PLEDS +. c-versed gtt started by neuro per RN.  MRI - New signal abnormality in the left thalamus  2/15 EEG - PLEDS 2/17 - EEG - PLEDS improved 2/18 PLEDS resolved  SUBJECTIVE/OVERNIGHT/INTERVAL HX Followed commands intermittent   VITAL SIGNS: BP 176/78 mmHg  Pulse 93  Temp(Src) 98.7 F (37.1 C) (Oral)  Resp 24  Ht _0  (1.6 m)  Wt 53.9 kg (118 lb 13.3 oz)  BMI 21.05 kg/m2  SpO2 98%  HEMODYNAMICS:    VENTILATOR SETTINGS: Vent Mode:  [-] PSV;CPAP FiO2 (%):  [30 %] 30 % Set  Rate:  [10 bmp] 10 bmp Vt Set:  [450 mL] 450 mL PEEP:  [5 cmH20] 5 cmH20 Pressure Support:  [5 cmH20] 5 cmH20 Plateau Pressure:  [12 cmH20-15 cmH20] 13 cmH20  INTAKE / OUTPUT: I/O last 3 completed shifts: In: 2510 [I.V.:360; NG/GT:1700; IV Piggyback:450] Out: 2575 [Urine:2575]  PHYSICAL EXAMINATION: General: chr  Ill appearing Neuro: opens eyes to pain, purposeful uppers HEENT: No JVD Cardiovascular:  Regular, no murmur s1 s2  Lungs:  reduced Abdomen:  Soft, nontender, no r/g Musculoskeletal:  No edema Skin:  No rashes  LABS:  PULMONARY No results for input(s): PHART, PCO2ART, PO2ART, HCO3, TCO2, O2SAT in the last 168 hours.  Invalid input(s): PCO2, PO2  CBC  Recent Labs Lab 10/21/15 0600 10/22/15 0440 10/23/15 0521  HGB 9.1* 8.8* 8.2*  HCT 29.4* 28.3* 25.7*  WBC 4.9 4.9 5.8  PLT 282 283 257    COAGULATION No results for input(s): INR in the last 168 hours.  CARDIAC  No results for input(s): TROPONINI in the last 168 hours. No results for input(s): PROBNP in the last 168 hours.   CHEMISTRY  Recent Labs Lab 10/18/15 0425 10/19/15 0600 10/20/15 0531 10/21/15 0600 10/22/15 0440 10/23/15 0521  NA 139 138 138 141 140 141  K 2.9* 3.6 3.3* 3.3* 3.3* 3.3*  CL 103 105 104 106 107 107  CO2 _1 GLUCOSE 122* 122* 125* 128* 118* 123*  BUN  21* 20 24* 27* 23* 26*  CREATININE 0.48 0.54 0.52 0.48 0.56 0.55  CALCIUM 8.9 9.5 9.5 9.5 9.5 9.2  MG 1.8 1.6*  --   --   --  1.6*  PHOS 2.5 2.7  --   --   --  2.7   Estimated Creatinine Clearance: 44.1 mL/min (by C-G formula based on Cr of 0.55).   LIVER No results for input(s): AST, ALT, ALKPHOS, BILITOT, PROT, ALBUMIN, INR in the last 168 hours.   INFECTIOUS No results for input(s): LATICACIDVEN, PROCALCITON in the last 168 hours.   ENDOCRINE CBG (last 3)   Recent Labs  10/23/15 1946 10/23/15 2321 10/24/15 0319  GLUCAP 111* 113* 110*    Dg Chest Port 1 View  10/23/2015  CLINICAL DATA:   Acute respiratory failure, intubated patient, acute encephalopathy. EXAM: PORTABLE CHEST 1 VIEW COMPARISON:  Portable chest x-ray of October 20, 2015 FINDINGS: The lungs are adequately inflated. There is no focal infiltrate. There is no pleural effusion or pneumothorax. The heart and pulmonary vascularity are normal. The endotracheal tube tip lies approximately 6.6 cm above the carina. The esophagogastric tube tip projects below the inferior margin of the image. The PICC line tip projects over the midportion of the SVC. The observed bony thorax is unremarkable. IMPRESSION: There is no acute cardiopulmonary abnormality. The support tubes are in reasonable position. Electronically Signed   By: David  Martinique M.D.   On: 10/23/2015 07:38     DISCUSSION: 82 yo female with acute encephalopathy and status epilepticus.  She has hx of CVA's and polypharmacy.  She required intubation for diprivan coma to control seizures.  2/12 recurrent szs with diprivan dosed decreased. Restarted on heavy sedation and anti-epileptics. Seizure activity resolved on cEEG  ASSESSMENT / PLAN:  PULMONARY A: Acute respiratory failure in setting of status epilepticus.  P:   Full vent support Last abg with alk on 11th Monitor for apnea , then would consider alkalosis Wean cpap 5 psp 5,g oal 2 hr Assess rsbi and airway protection, may need trach if not progressed this week  CARDIOVASCULAR A:  Hx of HTN, HLD. P:  Continue zocor  RENAL  A:   Hypomag hypokalemia P:   Hypo k, mag Avoid gross overload Even balance goals  GASTROINTESTINAL A:   Nutrition. Chronic abdominal pain. P:   Tube feeds while on vent Protonix for SUP Evaluate last BM  HEMATOLOGIC A:   No acute issues. P:  F/u CBC Lovenox for DVT prevention  INFECTIOUS  A:   No e/o Encephalitis. P:   Monitor off abx  ENDOCRINE A:   No acute issues. P:   Monitor CBGs  NEUROLOGIC A:   Acute encephalopathy 2nd to status epilepticus. Hx  of anxiety, depression on chronic benzo's. Hx of CVA. Breakthrough szs when diprivan decreased  - off c-EEG, PLEDS have resolved P:   AEDs being tapered  per neurology - dilantin, topamax, keppra, vimpat Plan is to stop pregabalin 2/21, probably taper down phenobarb next.  Full WUA  GLOBAL 2/16 updated d in law; no family present at bedside 2/21 May need trach  Ccm time 30 min   Lavon Paganini. Titus Mould, MD, [place] Pgr: [facility]

## 2015-10-24 NOTE — Clinical Social Work Note (Signed)
Clinical Social Worker continuing to follow patient and family for support and discharge planning needs. Patient remains on the ventilator at this time. CSW remains available for support and to facilitate patient discharge needs once medically stable.  Barbette Or, [place]

## 2015-10-24 NOTE — Progress Notes (Addendum)
Subjective: Slight improvement since prior neurology evaluation. Follows some commands.   Objective: Current vital signs: BP 173/78 mmHg  Pulse 93  Temp(Src) 98.7 F (37.1 C) (Oral)  Resp 22  Ht 5\' 3"  (1.6 m)  Wt 53.9 kg (118 lb 13.3 oz)  BMI 21.05 kg/m2  SpO2 100% Vital signs in last 24 hours: Temp:  [98.3 F (36.8 C)-99.2 F (37.3 C)] 98.7 F (37.1 C) (02/22 0400) Pulse Rate:  [84-99] 93 (02/22 0700) Resp:  [13-25] 22 (02/22 0700) BP: (140-197)/(62-83) 173/78 mmHg (02/22 0700) SpO2:  [95 %-100 %] 100 % (02/22 0700) FiO2 (%):  [30 %] 30 % (02/22 0311) Weight:  [53.9 kg (118 lb 13.3 oz)] 53.9 kg (118 lb 13.3 oz) (02/22 0500)  Intake/Output from previous day: 02/21 0701 - 02/22 0700 In: 1690 [I.V.:240; NG/GT:1150; IV Piggyback:300] Out: [id number] [Urine:1775] Intake/Output this shift:   Nutritional status: Diet NPO time specified  Neurologic Exam: Ment: Opens eyes partially to command. Mouths an answer when asked to count five fingers, but examiner not able to discern what she is trying to say.. CN: PERRL, fixates on visual stimulus briefly, but does not track visually. Weak horizontal extraocular movements to right away from bright light, but not towards left. Doll's eye suppressed in awake state. Difficult to detect possible facial asymmetry as patient is intubated. Motor/Sensory: Flaccid LUE. LLE with triple flexion response to noxious and no purposeful movement. RUE and RLE with purposeful movement at 2/5.  Reflexes: Hypoactive x 4. Toes mute.    Lab Results: Basic Metabolic Panel:  Recent Labs Lab 10/18/15 0425 10/19/15 0600 10/20/15 0531 10/21/15 0600 10/22/15 0440 10/23/15 0521  NA 139 138 138 141 140 141  K 2.9* 3.6 3.3* 3.3* 3.3* 3.3*  CL 103 105 104 106 107 107  CO2 28 25 25 26 26 25   GLUCOSE 122* 122* 125* 128* 118* 123*  BUN 21* 20 24* 27* 23* 26*  CREATININE 0.48 0.54 0.52 0.48 0.56 0.55  CALCIUM 8.9 9.5 9.5 9.5 9.5 9.2  MG 1.8 1.6*  --   --   --   1.6*  PHOS 2.5 2.7  --   --   --  2.7    Liver Function Tests: No results for input(s): AST, ALT, ALKPHOS, BILITOT, PROT, ALBUMIN in the last 168 hours. No results for input(s): LIPASE, AMYLASE in the last 168 hours. No results for input(s): AMMONIA in the last 168 hours.  CBC:  Recent Labs Lab 10/18/15 0425 10/19/15 0600 10/21/15 0600 10/22/15 0440 10/23/15 0521  WBC 4.4 7.5 4.9 4.9 5.8  NEUTROABS 2.1 4.4  --   --   --   HGB 8.9* 9.9* 9.1* 8.8* 8.2*  HCT 27.9* 31.8* 29.4* 28.3* 25.7*  MCV 94.3 94.9 95.1 95.3 96.3  PLT 230 334 282 283 257    Cardiac Enzymes: No results for input(s): CKTOTAL, CKMB, CKMBINDEX, TROPONINI in the last 168 hours.  Lipid Panel:  Recent Labs Lab 10/18/15 1815  TRIG 99    CBG:  Recent Labs Lab 10/23/15 1129 10/23/15 1519 10/23/15 1946 10/23/15 2321 10/24/15 0319  GLUCAP 124* 117* 111* 78* 110*    Microbiology: Results for orders placed or performed during the hospital encounter of 10/08/15  Urine culture     Status: None   Collection Time: 10/09/15  1:43 AM  Result Value Ref Range Status   Specimen Description URINE, CATHETERIZED  Final   Special Requests NONE  Final   Culture   Final    >=100,000  COLONIES/mL DIPHTHEROIDS(CORYNEBACTERIUM SPECIES) Standardized susceptibility testing for this organism is not available.    Report Status 10/10/2015 FINAL  Final  Culture, blood (Routine X 2) w Reflex to ID Panel     Status: None   Collection Time: 10/09/15  5:30 AM  Result Value Ref Range Status   Specimen Description BLOOD LEFT HAND  Final   Special Requests IN PEDIATRIC BOTTLE 1CC  Final   Culture NO GROWTH 5 DAYS  Final   Report Status 10/14/2015 FINAL  Final  Culture, blood (Routine X 2) w Reflex to ID Panel     Status: None   Collection Time: 10/09/15  5:38 AM  Result Value Ref Range Status   Specimen Description BLOOD LEFT ARM  Final   Special Requests IN PEDIATRIC BOTTLE [place]  Final   Culture NO GROWTH 5 DAYS  Final    Report Status 10/14/2015 FINAL  Final  MRSA PCR Screening     Status: None   Collection Time: 10/10/15 11:39 PM  Result Value Ref Range Status   MRSA by PCR NEGATIVE NEGATIVE Final    Comment:        The GeneXpert MRSA Assay (FDA approved for NASAL specimens only), is one component of a comprehensive MRSA colonization surveillance program. It is not intended to diagnose MRSA infection nor to guide or monitor treatment for MRSA infections.   CSF culture     Status: None   Collection Time: 10/11/15  1:33 PM  Result Value Ref Range Status   Specimen Description CSF  Final   Special Requests CSF NO 2  Final   Gram Stain   Final    CYTOSPIN SMEAR WBC PRESENT, PREDOMINANTLY PMN NO ORGANISMS SEEN    Culture NO GROWTH 3 DAYS  Final   Report Status 10/14/2015 FINAL  Final  Culture, respiratory (NON-Expectorated)     Status: None   Collection Time: 10/13/15  9:30 AM  Result Value Ref Range Status   Specimen Description ENDOTRACHEAL  Final   Special Requests NONE  Final   Gram Stain   Final    FEW WBC PRESENT,BOTH PMN AND MONONUCLEAR RARE SQUAMOUS EPITHELIAL CELLS PRESENT NO ORGANISMS SEEN Performed at [facility]    Culture   Final    FEW YEAST CONSISTENT WITH CANDIDA SPECIES Performed at [facility]    Report Status 10/16/2015 FINAL  Final    Coagulation Studies: No results for input(s): LABPROT, INR in the last 72 hours.  Imaging: Dg Chest Port 1 View  10/23/2015  CLINICAL DATA:  Acute respiratory failure, intubated patient, acute encephalopathy. EXAM: PORTABLE CHEST 1 VIEW COMPARISON:  Portable chest x-ray of October 20, 2015 FINDINGS: The lungs are adequately inflated. There is no focal infiltrate. There is no pleural effusion or pneumothorax. The heart and pulmonary vascularity are normal. The endotracheal tube tip lies approximately 6.6 cm above the carina. The esophagogastric tube tip projects below the inferior margin of the image. The PICC line  tip projects over the midportion of the SVC. The observed bony thorax is unremarkable. IMPRESSION: There is no acute cardiopulmonary abnormality. The support tubes are in reasonable position. Electronically Signed   By: David  Martinique M.D.   On: 10/23/2015 07:38    Medications: Current medications reviewed.  Current facility-administered medications:  .  Place/Maintain arterial line, , , Until Discontinued **AND** 0.9 %  sodium chloride infusion, , Intra-arterial, PRN, Javier Glazier, MD, Last Rate: 10 mL/hr at 10/23/15 2000 .  acetaminophen (TYLENOL) solution  650 mg, 650 mg, Oral, Q6H PRN, Chesley Mires, MD .  antiseptic oral rinse solution (CORINZ), 7 mL, Mouth Rinse, 10 times per day, Chesley Mires, MD, 7 mL at 10/24/15 0500 .  bisacodyl (DULCOLAX) suppository 10 mg, 10 mg, Rectal, Daily PRN, Chesley Mires, MD .  chlorhexidine gluconate (PERIDEX) 0.12 % solution 15 mL, 15 mL, Mouth Rinse, BID, Chesley Mires, MD, 15 mL at 10/23/15 2028 .  enoxaparin (LOVENOX) injection 40 mg, 40 mg, Subcutaneous, Q24H, Drema Dallas, DO, 40 mg at 10/23/15 [id number] .  feeding supplement (VITAL AF 1.2 CAL) liquid 1,000 mL, 1,000 mL, Per Tube, Continuous, Brand Males, MD, Last Rate: 50 mL/hr at 10/23/15 2000, 1,000 mL at 10/23/15 2000 .  fentaNYL (SUBLIMAZE) injection 50 mcg, 50 mcg, Intravenous, Q2H PRN, Chesley Mires, MD, 50 mcg at 10/21/15 0133 .  hydrALAZINE (APRESOLINE) injection 5 mg, 5 mg, Intravenous, Q2H PRN, Ivor Costa, MD, 5 mg at 10/15/15 1422 .  insulin aspart (novoLOG) injection 0-15 Units, 0-15 Units, Subcutaneous, 6 times per day, Chesley Mires, MD, 2 Units at 10/23/15 1235 .  lacosamide (VIMPAT) 100 mg in sodium chloride 0.9 % 25 mL IVPB, 100 mg, Intravenous, Q12H, Ram Fuller Mandril, MD, 100 mg at 10/23/15 2151 .  levETIRAcetam (KEPPRA) 1,500 mg in sodium chloride 0.9 % 100 mL IVPB, 1,500 mg, Intravenous, Q12H, Ram Fuller Mandril, MD, 1,500 mg at 10/23/15 2149 .  LORazepam (ATIVAN)  injection 1-2 mg, 1-2 mg, Intravenous, Q4H PRN, Kara Mead V, MD .  nystatin-triamcinolone (MYCOLOG II) cream 1 application, 1 application, Topical, BID, Lauren D Bajbus, RPH, 1 application at [id number] .  [DISCONTINUED] ondansetron (ZOFRAN) tablet 4 mg, 4 mg, Oral, Q6H PRN **OR** ondansetron (ZOFRAN) injection 4 mg, 4 mg, Intravenous, Q6H PRN, Ivor Costa, MD .  pantoprazole sodium (PROTONIX) 40 mg/20 mL oral suspension 40 mg, 40 mg, Per Tube, Q24H, Chesley Mires, MD, 40 mg at 10/23/15 2028 .  PHENObarbital (LUMINAL) injection 32.5 mg, 32.5 mg, Intravenous, TID, Ram Fuller Mandril, MD, 32.5 mg at 10/23/15 2152 .  phenytoin (DILANTIN) injection 50 mg, 50 mg, Intravenous, Q12H, Ram Fuller Mandril, MD, 50 mg at 10/23/15 2152 .  sennosides (SENOKOT) 8.8 MG/5ML syrup 5 mL, 5 mL, Per Tube, BID PRN, Chesley Mires, MD, 5 mL at 10/15/15 0923 .  simvastatin (ZOCOR) tablet 40 mg, 40 mg, Per Tube, q morning - 10a, Chesley Mires, MD, 40 mg at 10/23/15 0941 .  sodium chloride flush (NS) 0.9 % injection 10-40 mL, 10-40 mL, Intracatheter, Q12H, Raylene Miyamoto, MD, 10 mL at 10/23/15 2153 .  sodium chloride flush (NS) 0.9 % injection 10-40 mL, 10-40 mL, Intracatheter, PRN, Raylene Miyamoto, MD .  sodium chloride flush (NS) 0.9 % injection 3 mL, 3 mL, Intravenous, Q12H, Ivor Costa, MD, 3 mL at 10/23/15 0941 .  topiramate (TOPAMAX) tablet 100 mg, 100 mg, Per Tube, BID, Wynell Balloon, RPH, 100 mg at 10/23/15 2200 .  vitamin B-12 (CYANOCOBALAMIN) tablet 1,000 mcg, 1,000 mcg, Per Tube, Daily, Chesley Mires, MD, 1,000 mcg at 10/23/15 0940 .  vitamin C (ASCORBIC ACID) tablet 500 mg, 500 mg, Per Tube, Daily, Chesley Mires, MD, 500 mg at 10/23/15 1000 .  vitamin e oil OIL 200 Units, 200 Units, Per Tube, Daily, Chesley Mires, MD, 200 Units at 10/23/15 0940   Assessment/Plan: 1. Kathryn [name] is an 82 y.o. female patient who was admitted to the ICU since 10/09/15, with prolonged status epilepticus arising from  the left  insular cortex, on long-term EEG monitoring. Anticonvulsant regimen achieved resolution of status, with subsequent EEG showing improvement compared to previous recordings, but with left hemispheric periodic lateralized epileptiform discharges persisting, but becoming less developed, less organized and occurring less frequently. Versed and propofol have been weaned off.    2. Continuous EEG has been discontinued. PLEDS have resolved.  3. Phenytoin level on 2/20 was therapeutic at 12.  4. Current anticonvulsant regimen (2/20) consists of Vimpat 100 mg BID, Keppra 1500 mg BID, phenobarbital 32.5 mg TID, Dilantin 50 mg BID and Topamax 100 mg BID.  5. Most recent EEG from 2/19 showed no clinical or subclinical seizures. Background activities were marked by delta and theta slowing, reactive to external stimuli. Left hemispheric periodic lateralized epileptiform discharges were no longer present . Occasional left hemispheric sharp waves were present throughout the recording with maximum negativity in the left post temporal cortex.  6. MRI performed 2/14 revealed new DWI signal abnormality in the left thalamus. The radiological DDx includes focal cytotoxic edema secondary to status epilepticus, infarct, encephalitis, or vasculitis. There was no specific cortical finding on the MRI to explain seizure  Recommendations:  1. Continue Vimpat, Keppra, Dilantin, phenobarbital and Topamax at current doses.  2. Pregabalin was discontinued on 2/21.  3. Phenobarbital would be the best next candidate for tapering as it is the most sedating of her seizure medications. The plan has been that this can be gradually tapered off if repeat EEG is negative for electrographic seizures, but it can also be tapered without needing an interim EEG if there is no clinical evidence for seizures while the patient is awake. Today, she was awake without evidence for seizure activity clinically. Therefore, would start  phenobarbital taper as follows: change phenobarbital dosing to 32.5 mg BID and continue for 3 days, then decrease to 32.5 mg qd and continue for 3 days, then stop.    A total of 35 minutes spent in evaluation and coordination of care for this critically ill patient.   10/24/2015, 7:55 AM

## 2015-10-25 DIAGNOSIS — J9600 Acute respiratory failure, unspecified whether with hypoxia or hypercapnia: Secondary | ICD-10-CM

## 2015-10-25 DIAGNOSIS — R569 Unspecified convulsions: Secondary | ICD-10-CM | POA: Insufficient documentation

## 2015-10-25 LAB — COMPREHENSIVE METABOLIC PANEL
ALT: 136 U/L — AB (ref 14–54)
AST: 106 U/L — AB (ref 15–41)
Albumin: 2.5 g/dL — ABNORMAL LOW (ref 3.5–5.0)
Alkaline Phosphatase: 69 U/L (ref 38–126)
Anion gap: 9 (ref 5–15)
BUN: 25 mg/dL — AB (ref 6–20)
CHLORIDE: 107 mmol/L (ref 101–111)
CO2: 24 mmol/L (ref 22–32)
CREATININE: 0.44 mg/dL (ref 0.44–1.00)
Calcium: 9.3 mg/dL (ref 8.9–10.3)
Glucose, Bld: 127 mg/dL — ABNORMAL HIGH (ref 65–99)
POTASSIUM: 3.1 mmol/L — AB (ref 3.5–5.1)
Sodium: 140 mmol/L (ref 135–145)
TOTAL PROTEIN: 6.2 g/dL — AB (ref 6.5–8.1)

## 2015-10-25 LAB — GLUCOSE, CAPILLARY
GLUCOSE-CAPILLARY: 108 mg/dL — AB (ref 65–99)
GLUCOSE-CAPILLARY: 117 mg/dL — AB (ref 65–99)
GLUCOSE-CAPILLARY: 80 mg/dL (ref 65–99)
Glucose-Capillary: 116 mg/dL — ABNORMAL HIGH (ref 65–99)
Glucose-Capillary: 104 mg/dL — ABNORMAL HIGH (ref 65–99)
Glucose-Capillary: 105 mg/dL — ABNORMAL HIGH (ref 65–99)

## 2015-10-25 MED ORDER — POLYETHYLENE GLYCOL 3350 17 G PO PACK
17.0000 g | PACK | Freq: Every day | ORAL | Status: DC
  Administered 2015-10-25 – 2015-11-03 (×5): 17 g via ORAL
  Filled 2015-10-25 (×5): qty 1

## 2015-10-25 MED ORDER — TOPIRAMATE 100 MG PO TABS
100.0000 mg | ORAL_TABLET | Freq: Two times a day (BID) | ORAL | Status: DC
  Administered 2015-10-28: 100 mg
  Filled 2015-10-25 (×6): qty 1

## 2015-10-25 MED ORDER — POTASSIUM CHLORIDE 20 MEQ/15ML (10%) PO SOLN
40.0000 meq | ORAL | Status: AC
Start: 2015-10-25 — End: 2015-10-25
  Administered 2015-10-25: 40 meq
  Filled 2015-10-25: qty 30

## 2015-10-25 MED ORDER — PANTOPRAZOLE SODIUM 40 MG IV SOLR
40.0000 mg | INTRAVENOUS | Status: DC
  Administered 2015-10-25 – 2015-10-29 (×5): 40 mg via INTRAVENOUS
  Filled 2015-10-25 (×5): qty 40

## 2015-10-25 NOTE — Progress Notes (Signed)
Nutrition Follow-up  INTERVENTION:   Continue Vital AF 1.2 @ 50 ml/hr Provides: 1440 kcal (100% of needs), 90 grams protein, and 973 ml H2O.   NUTRITION DIAGNOSIS:   Inadequate oral intake related to inability to eat as evidenced by NPO status. Ongoing.   GOAL:   Patient will meet greater than or equal to 90% of their needs Met.   MONITOR:   TF tolerance, Vent status, Labs, I & O's  ASSESSMENT:   [name] with PMH of HTN, hyperlipidemia, GERD, hypothyroidism, depression, anxiety, TIA, stroke, recurrent UTI, chronic back pain, chronic abdominal pain, who presented with altered mental status and hallucination.  Patient is currently intubated on ventilator support MV: 10 L/min Temp (24hrs), Avg:99.7 F (37.6 C), Min:99.4 F (37.4 C), Max:100.4 F (38 C)  OG tube with Vital AF 1.2 infusing @ 50 ml/hr MD and family discussing plan of care.  Medications reviewed and include: KCl, miralax, vitamin C, vitamin E Labs reviewed: potassium low 3.1 CBG's: 104-117 Pt discussed during ICU rounds and with RN.   Diet Order:  Diet NPO time specified  Skin:  Reviewed, no issues  Last BM:  2/16  Height:   Ht Readings from Last 1 Encounters:  10/21/15 _0  (1.6 m)    Weight:   Wt Readings from Last 1 Encounters:  10/25/15 121 lb 4.1 oz (55 kg)    Ideal Body Weight:  52 kg  BMI:  Body mass index is 21.48 kg/(m^2).  Estimated Nutritional Needs:   Kcal:  1424  Protein:  75-85 grams  Fluid:  >/= 1.5 L  EDUCATION NEEDS:   No education needs identified at this time  [place], [place], [place] Pager [phone] After Hours Pager

## 2015-10-25 NOTE — Procedures (Signed)
Extubation Procedure Note  Patient Details:   Name: Kathryn [name] DOB: [birth year] MRN: [id number]   Airway Documentation:     Evaluation  O2 sats: stable throughout Complications: No apparent complications Patient did tolerate procedure well. Bilateral Breath Sounds: Rhonchi Suctioning: Airway, Oral Yes  Hope Pigeon, MA 10/25/2015, 4:06 PM

## 2015-10-25 NOTE — Progress Notes (Signed)
eLink Physician-Brief Progress Note Patient Name: Kathryn [name] DOB: 1930-09-16 MRN: [id number]   Date of Service  10/25/2015  HPI/Events of Note  Ng out/ nursing requesting change protonix to IV   eICU Interventions  rec protonix 40 mg IV daily until taking po ok     Intervention Category Intermediate Interventions: Best-practice therapies (e.g. DVT, beta blocker, etc.)  Christinia Gully 10/25/2015, 9:03 PM

## 2015-10-25 NOTE — Progress Notes (Signed)
Patient ID: Kathryn [name], female   DOB: Dec 06, 1930, 82 y.o.   MRN: [id number]  I have had extensive discussions with family son. We discussed patients current circumstances and organ failures. We also discussed patient's prior wishes under circumstances such as this. Family has decided to NOT perform resuscitation if arrest but to continue current medical support for now. Comfort if fails extubation. We reviewed her AD as well as her wishes in these circumstances A wonderful family .  Lavon Paganini. Titus Mould, MD, [place] Pgr: [facility]

## 2015-10-25 NOTE — Progress Notes (Signed)
Subjective: Slowly improving. Now more awake.   Objective: Current vital signs: BP 160/81 mmHg  Pulse 98  Temp(Src) 99.4 F (37.4 C) (Axillary)  Resp 26  Ht 5\' 3"  (1.6 m)  Wt 55 kg (121 lb 4.1 oz)  BMI 21.48 kg/m2  SpO2 99% Vital signs in last 24 hours: Temp:  [99.4 F (37.4 C)-100.4 F (38 C)] 99.4 F (37.4 C) (02/23 1131) Pulse Rate:  [80-98] 98 (02/23 1221) Resp:  [0-26] 26 (02/23 1221) BP: (137-179)/(62-81) 160/81 mmHg (02/23 1221) SpO2:  [97 %-100 %] 99 % (02/23 1221) FiO2 (%):  [30 %] 30 % (02/23 1221) Weight:  [55 kg (121 lb 4.1 oz)] 55 kg (121 lb 4.1 oz) (02/23 0500)  Intake/Output from previous day: 02/22 0701 - 02/23 0700 In: 1955 [I.V.:350; NG/GT:1320; IV Piggyback:285] Out: 2050 [Urine:2050] Intake/Output this shift: Total I/O In: 80 [I.V.:30; NG/GT:50] Out: -  Nutritional status: Diet NPO time specified  Neurologic Exam: Ment: Opens eyes spontaneously and to command. Mouths an answer when asked a question, but examiner not able to discern what she is trying to say.. CN: PERRL, fixates on visual stimulus briefly, but does not track visually. Weak horizontal extraocular movements. Doll's eye suppressed in awake state. Difficult to detect possible facial asymmetry as patient is intubated. Motor/Sensory: Flaccid LUE tone, but occasional 2/5 spontaneous movement. LLE with triple flexion response to noxious and no purposeful movement. RUE and RLE with purposeful movement at 2/5.  Reflexes: Hypoactive x 4. Toes mute.    Lab Results: Basic Metabolic Panel:  Recent Labs Lab 10/19/15 0600 10/20/15 0531 10/21/15 0600 10/22/15 0440 10/23/15 0521 10/25/15 0550  NA 138 138 141 140 141 140  K 3.6 3.3* 3.3* 3.3* 3.3* 3.1*  CL 105 104 106 107 107 107  CO2 25 25 26 26 25 24   GLUCOSE 122* 125* 128* 118* 123* 127*  BUN 20 24* 27* 23* 26* 25*  CREATININE 0.54 0.52 0.48 0.56 0.55 0.44  CALCIUM 9.5 9.5 9.5 9.5 9.2 9.3  MG 1.6*  --   --   --  1.6*  --   PHOS 2.7   --   --   --  2.7  --     Liver Function Tests:  Recent Labs Lab 10/25/15 0550  AST 106*  ALT 136*  ALKPHOS 69  BILITOT <0.1*  PROT 6.2*  ALBUMIN 2.5*   No results for input(s): LIPASE, AMYLASE in the last 168 hours. No results for input(s): AMMONIA in the last 168 hours.  CBC:  Recent Labs Lab 10/19/15 0600 10/21/15 0600 10/22/15 0440 10/23/15 0521 10/24/15 1145  WBC 7.5 4.9 4.9 5.8 6.7  NEUTROABS 4.4  --   --   --  5.1  HGB 9.9* 9.1* 8.8* 8.2* 9.6*  HCT 31.8* 29.4* 28.3* 25.7* 29.0*  MCV 94.9 95.1 95.3 96.3 95.1  PLT 334 282 283 257 279    Cardiac Enzymes: No results for input(s): CKTOTAL, CKMB, CKMBINDEX, TROPONINI in the last 168 hours.  Lipid Panel:  Recent Labs Lab 10/18/15 1815  TRIG 99    CBG:  Recent Labs Lab 10/24/15 1943 10/24/15 2345 10/25/15 0313 10/25/15 0741 10/25/15 1128  GLUCAP 107* 108* 117* 116* 104*    Microbiology: Results for orders placed or performed during the hospital encounter of 10/08/15  Urine culture     Status: None   Collection Time: 10/09/15  1:43 AM  Result Value Ref Range Status   Specimen Description URINE, CATHETERIZED  Final   Special Requests  NONE  Final   Culture   Final    >=100,000 COLONIES/mL DIPHTHEROIDS(CORYNEBACTERIUM SPECIES) Standardized susceptibility testing for this organism is not available.    Report Status 10/10/2015 FINAL  Final  Culture, blood (Routine X 2) w Reflex to ID Panel     Status: None   Collection Time: 10/09/15  5:30 AM  Result Value Ref Range Status   Specimen Description BLOOD LEFT HAND  Final   Special Requests IN PEDIATRIC BOTTLE 1CC  Final   Culture NO GROWTH 5 DAYS  Final   Report Status 10/14/2015 FINAL  Final  Culture, blood (Routine X 2) w Reflex to ID Panel     Status: None   Collection Time: 10/09/15  5:38 AM  Result Value Ref Range Status   Specimen Description BLOOD LEFT ARM  Final   Special Requests IN PEDIATRIC BOTTLE [place]  Final   Culture NO GROWTH 5  DAYS  Final   Report Status 10/14/2015 FINAL  Final  MRSA PCR Screening     Status: None   Collection Time: 10/10/15 11:39 PM  Result Value Ref Range Status   MRSA by PCR NEGATIVE NEGATIVE Final    Comment:        The GeneXpert MRSA Assay (FDA approved for NASAL specimens only), is one component of a comprehensive MRSA colonization surveillance program. It is not intended to diagnose MRSA infection nor to guide or monitor treatment for MRSA infections.   CSF culture     Status: None   Collection Time: 10/11/15  1:33 PM  Result Value Ref Range Status   Specimen Description CSF  Final   Special Requests CSF NO 2  Final   Gram Stain   Final    CYTOSPIN SMEAR WBC PRESENT, PREDOMINANTLY PMN NO ORGANISMS SEEN    Culture NO GROWTH 3 DAYS  Final   Report Status 10/14/2015 FINAL  Final  Culture, respiratory (NON-Expectorated)     Status: None   Collection Time: 10/13/15  9:30 AM  Result Value Ref Range Status   Specimen Description ENDOTRACHEAL  Final   Special Requests NONE  Final   Gram Stain   Final    FEW WBC PRESENT,BOTH PMN AND MONONUCLEAR RARE SQUAMOUS EPITHELIAL CELLS PRESENT NO ORGANISMS SEEN Performed at [facility]    Culture   Final    FEW YEAST CONSISTENT WITH CANDIDA SPECIES Performed at [facility]    Report Status 10/16/2015 FINAL  Final    Coagulation Studies: No results for input(s): LABPROT, INR in the last 72 hours.  Imaging: No results found.  Medications: Medication list reviewed  Assessment/Plan: 1. Kathryn [name] is an 82 y.o. female patient who was admitted to the ICU since 10/09/15, with prolonged status epilepticus arising from the left insular cortex, on long-term EEG monitoring. Anticonvulsant regimen achieved resolution of status, with subsequent EEG showing improvement compared to previous recordings, but with left hemispheric periodic lateralized epileptiform discharges persisting, but becoming less developed, less  organized and occurring less frequently. Versed and propofol have been weaned off.  2. Continuous EEG has been discontinued. PLEDS have resolved.  3. Phenytoin level on 2/20 was therapeutic at 12.  4. Current anticonvulsant regimen (2/20) consists of Vimpat 100 mg BID, Keppra 1500 mg BID, phenobarbital 32.5 mg TID, Dilantin 50 mg BID and Topamax 100 mg BID.  5. Most recent EEG from 2/19 showed no clinical or subclinical seizures. Background activities were marked by delta and theta slowing, reactive to external stimuli. Left  hemispheric periodic lateralized epileptiform discharges were no longer present . Occasional left hemispheric sharp waves were present throughout the recording with maximum negativity in the left post temporal cortex.  6. MRI performed 2/14 revealed new DWI signal abnormality in the left thalamus. The radiological DDx includes focal cytotoxic edema secondary to status epilepticus, infarct, encephalitis, or vasculitis. There was no specific cortical finding on the MRI to explain seizure. 7. Hypomagnesemia. Could contribute to her AMS. Also can precipitate seizures if Mg is moderately to severely low.   Recommendations:  1. Continue Vimpat, Keppra, Dilantin, phenobarbital and Topamax at current doses.  2. Phenobarbital would be the best next candidate for tapering as it is the most sedating of her seizure medications. The plan has been that this can be gradually tapered off if repeat EEG is negative for electrographic seizures, but it can also be tapered without needing an interim EEG if there is no clinical evidence for seizures while the patient is awake. Today, she was awake without evidence for seizure activity clinically. Therefore, would start phenobarbital taper as follows: change phenobarbital dosing to 32.5 mg BID and continue for 3 days, then decrease to 32.5 mg qd and continue for 3 days, then stop.   Kathryn Elbe, MD 10/25/2015, 12:54 PM

## 2015-10-25 NOTE — Progress Notes (Addendum)
PULMONARY / CRITICAL CARE MEDICINE   Name: Kathryn [name] MRN: [id number] DOB: April 08, 1931    ADMISSION DATE:  10/08/2015 CONSULTATION DATE:  10/12/2015  REFERRING MD:  Dr. Janann Colonel, neurology  CHIEF COMPLAINT:  Seizures.  HISTORY OF PRESENT ILLNESS:   82 yo female brought to ER due to confusion after falling.  She was also having visual hallucinations.  She was noted to have seizure on 2/08 and neurology consulted.  She was also noted to have fever.  She had LP that was unrevealing.  She was started on abx for possible UTI, and acyclovir for possible encephalitis.  Her seizures persistent, and she was transferred to ICU for diprivan coma and intubation.  CULTURES: 2/07 Urine >> diphtheroids 2/07 Blood >>NGTD 2/09 CSF >>NGTD  ANTIBIOTICS: 2/06 Levaquin >>off 2/09 Acyclovir >> 2/11 2/10 Vanco>>off 2/10 ceftaz>> off 2/11 sputum>> candida  Tubes 2/10 ETT >>   STUDIES and events:  2/06 Admit 2/08 Noted to have seizure, neuro consulted 2/06 CT head >> atrophy 2/07 MRI brain >> remote basal ganglia and corona radiata infarcts b/l, remote Rt thalamus and posterior limb internal capsule infarcts, global atrophy 2/08 EEG >> lt temporal region focal seizures, lt hemisphere slowing 2/09 LP >> 140 RBC, 2 WBC, protein 38, glucose 51 2/10 Status epilepticus >> to ICU, diprivan coma  2./12 - Sedated on vent, szs overnight  2/13 - afebrile with ongoing c-eeg and diprivan 10/16/15 - c-EEG off. PEr tech - PLEDS +. c-versed gtt started by neuro per RN.  MRI - New signal abnormality in the left thalamus  2/15 EEG - PLEDS 2/17 - EEG - PLEDS improved 2/18 PLEDS resolved  SUBJECTIVE/OVERNIGHT/INTERVAL HX Weaned on vent 5/5 Not following commands  VITAL SIGNS: BP 151/69 mmHg  Pulse 81  Temp(Src) 100.4 F (38 C) (Axillary)  Resp 16  Ht 5\' 3"  (1.6 m)  Wt 55 kg (121 lb 4.1 oz)  BMI 21.48 kg/m2  SpO2 100%  HEMODYNAMICS:    VENTILATOR SETTINGS: Vent Mode:  [-] PRVC FiO2 (%):  [30 %] 30  % Set Rate:  [10 bmp] 10 bmp Vt Set:  [450 mL] 450 mL PEEP:  [5 cmH20] 5 cmH20 Pressure Support:  [5 cmH20] 5 cmH20 Plateau Pressure:  [13 cmH20-15 cmH20] 14 cmH20  INTAKE / OUTPUT: I/O last 3 completed shifts: In: 2835 [I.V.:480; NG/GT:1920; IV Piggyback:435] Out: 2625 [Urine:2625]  PHYSICAL EXAMINATION: General: chr  Ill appearing Neuro: opens eyes to pain, purposeful uppers, coughing well HEENT: JVD Cardiovascular:  Regular, no murmur s1 s2  Lungs:  ronchi Abdomen:  Soft, nontender, no r/g Musculoskeletal:  No edema Skin:  No rashes  LABS:  PULMONARY No results for input(s): PHART, PCO2ART, PO2ART, HCO3, TCO2, O2SAT in the last 168 hours.  Invalid input(s): PCO2, PO2  CBC  Recent Labs Lab 10/22/15 0440 10/23/15 0521 10/24/15 1145  HGB 8.8* 8.2* 9.6*  HCT 28.3* 25.7* 29.0*  WBC 4.9 5.8 6.7  PLT 283 257 279    COAGULATION No results for input(s): INR in the last 168 hours.  CARDIAC  No results for input(s): TROPONINI in the last 168 hours. No results for input(s): PROBNP in the last 168 hours.   CHEMISTRY  Recent Labs Lab 10/19/15 0600 10/20/15 0531 10/21/15 0600 10/22/15 0440 10/23/15 0521 10/25/15 0550  NA 138 138 141 140 141 140  K 3.6 3.3* 3.3* 3.3* 3.3* 3.1*  CL 105 104 106 107 107 107  CO2 25 25 26 26 25 24   GLUCOSE 122* 125* 128* 118*  123* 127*  BUN 20 24* 27* 23* 26* 25*  CREATININE 0.54 0.52 0.48 0.56 0.55 0.44  CALCIUM 9.5 9.5 9.5 9.5 9.2 9.3  MG 1.6*  --   --   --  1.6*  --   PHOS 2.7  --   --   --  2.7  --    Estimated Creatinine Clearance: 44.1 mL/min (by C-G formula based on Cr of 0.44).   LIVER  Recent Labs Lab 10/25/15 0550  AST 106*  ALT 136*  ALKPHOS 69  BILITOT <0.1*  PROT 6.2*  ALBUMIN 2.5*     INFECTIOUS No results for input(s): LATICACIDVEN, PROCALCITON in the last 168 hours.   ENDOCRINE CBG (last 3)   Recent Labs  10/24/15 2345 10/25/15 0313 10/25/15 0741  GLUCAP 108* 117* 116*    No results  found.   DISCUSSION: 82 yo female with acute encephalopathy and status epilepticus.  She has hx of CVA's and polypharmacy.  She required intubation for diprivan coma to control seizures.  2/12 recurrent szs with diprivan dosed decreased. Restarted on heavy sedation and anti-epileptics. Seizure activity resolved on cEEG  ASSESSMENT / PLAN:  PULMONARY A: Acute respiratory failure in setting of status epilepticus.  P:   Weaning this am cpa 5 ps 5, goal 1 hr Will discuss with family plan extubation and DNI vs consideration trach or extubation and if fail ett then trach No repeat pcxr needed  It appears that she has airway protection skills, and if wean well, could consider extubation trial  CARDIOVASCULAR A:  Hx of HTN, HLD. P:  Continue zocor tele  RENAL  A:   hypokalemia P:   Hypo k Avoid gross overload Even balance goals Last pcxr without edema  GASTROINTESTINAL A:   Nutrition. Chronic abdominal pain. P:   Tube feeds while on vent Protonix for SUP consider mirilax  Give dulc prn  HEMATOLOGIC A:   DVt prevention P:  F/u CBC Lovenox for DVT prevention  INFECTIOUS  A:   No Encephalitis. P:   Monitor off abx  ENDOCRINE A:   No acute issues. P:   Monitor CBGs  NEUROLOGIC A:   Acute encephalopathy 2nd to status epilepticus. Hx of anxiety, depression on chronic benzo's. Hx of CVA. Breakthrough szs when diprivan decreased  - off c-EEG, PLEDS have resolved P:   per neurology - dilantin, topamax, keppra, vimpat phenobarb per neuro Full WUA upright  GLOBAL 2/16 updated d in law; no family present at bedside 2/21 May need trach Family meeting 23rd today  Ccm time 30 min   Lavon Paganini. Titus Mould, MD, [place] Pgr: [facility]

## 2015-10-25 NOTE — Progress Notes (Signed)
PULMONARY / CRITICAL CARE MEDICINE   Name: Kathryn [name] MRN: [id number] DOB: [birth year]    ADMISSION DATE:  10/08/2015 CONSULTATION DATE:  10/12/2015  REFERRING MD:  Dr. Janann Colonel, neurology  CHIEF COMPLAINT:  Seizures.  HISTORY OF PRESENT ILLNESS:   82 yo female brought to ER due to confusion after falling.  She was also having visual hallucinations.  She was noted to have seizure on 2/08 and neurology consulted.  She was also noted to have fever.  She had LP that was unrevealing.  She was started on abx for possible UTI, and acyclovir for possible encephalitis.  Her seizures persistent, and she was transferred to ICU for diprivan coma and intubation.  CULTURES: 2/07 Urine >> diphtheroids 2/07 Blood >>NGTD 2/09 CSF >>NGTD  ANTIBIOTICS: 2/06 Levaquin >>off 2/09 Acyclovir >> 2/11 2/10 Vanco>>off 2/10 ceftaz>> off 2/11 sputum>> candida  Tubes 2/10 ETT >>   STUDIES and events:  2/06 Admit 2/08 Noted to have seizure, neuro consulted 2/06 CT head >> atrophy 2/07 MRI brain >> remote basal ganglia and corona radiata infarcts b/l, remote Rt thalamus and posterior limb internal capsule infarcts, global atrophy 2/08 EEG >> lt temporal region focal seizures, lt hemisphere slowing 2/09 LP >> 140 RBC, 2 WBC, protein 38, glucose 51 2/10 Status epilepticus >> to ICU, diprivan coma  2./12 - Sedated on vent, szs overnight  2/13 - afebrile with ongoing c-eeg and diprivan 10/16/15 - c-EEG off. PEr tech - PLEDS +. c-versed gtt started by neuro per RN.  MRI - New signal abnormality in the left thalamus  2/15 EEG - PLEDS 2/17 - EEG - PLEDS improved 2/18 PLEDS resolved  SUBJECTIVE/OVERNIGHT/INTERVAL HX Weaned on vent 5/5 Not following commands  VITAL SIGNS: BP 151/69 mmHg  Pulse 81  Temp(Src) 100.4 F (38 C) (Axillary)  Resp 16  Ht 5\' 3"  (1.6 m)  Wt 55 kg (121 lb 4.1 oz)  BMI 21.48 kg/m2  SpO2 100%  HEMODYNAMICS:    VENTILATOR SETTINGS: Vent Mode:  [-] PRVC FiO2 (%):  [30 %] 30  % Set Rate:  [10 bmp] 10 bmp Vt Set:  [450 mL] 450 mL PEEP:  [5 cmH20] 5 cmH20 Pressure Support:  [5 cmH20] 5 cmH20 Plateau Pressure:  [13 cmH20-15 cmH20] 14 cmH20  INTAKE / OUTPUT: I/O last 3 completed shifts: In: 2835 [I.V.:480; NG/GT:1920; IV Piggyback:435] Out: 2625 [Urine:2625]  PHYSICAL EXAMINATION: General: chr  Ill appearing Neuro: opens eyes to pain, purposeful uppers, coughing well HEENT: JVD Cardiovascular:  Regular, no murmur s1 s2  Lungs:  ronchi Abdomen:  Soft, nontender, no r/g Musculoskeletal:  No edema Skin:  No rashes  LABS:  PULMONARY No results for input(s): PHART, PCO2ART, PO2ART, HCO3, TCO2, O2SAT in the last 168 hours.  Invalid input(s): PCO2, PO2  CBC  Recent Labs Lab 10/22/15 0440 10/23/15 0521 10/24/15 1145  HGB 8.8* 8.2* 9.6*  HCT 28.3* 25.7* 29.0*  WBC 4.9 5.8 6.7  PLT 283 257 279    COAGULATION No results for input(s): INR in the last 168 hours.  CARDIAC  No results for input(s): TROPONINI in the last 168 hours. No results for input(s): PROBNP in the last 168 hours.   CHEMISTRY  Recent Labs Lab 10/19/15 0600 10/20/15 0531 10/21/15 0600 10/22/15 0440 10/23/15 0521 10/25/15 0550  NA 138 138 141 140 141 140  K 3.6 3.3* 3.3* 3.3* 3.3* 3.1*  CL 105 104 106 107 107 107  CO2 25 25 26 26 25 24   GLUCOSE 122* 125* 128* 118*  123* 127*  BUN 20 24* 27* 23* 26* 25*  CREATININE 0.54 0.52 0.48 0.56 0.55 0.44  CALCIUM 9.5 9.5 9.5 9.5 9.2 9.3  MG 1.6*  --   --   --  1.6*  --   PHOS 2.7  --   --   --  2.7  --    Estimated Creatinine Clearance: 44.1 mL/min (by C-G formula based on Cr of 0.44).   LIVER  Recent Labs Lab 10/25/15 0550  AST 106*  ALT 136*  ALKPHOS 69  BILITOT <0.1*  PROT 6.2*  ALBUMIN 2.5*     INFECTIOUS No results for input(s): LATICACIDVEN, PROCALCITON in the last 168 hours.   ENDOCRINE CBG (last 3)   Recent Labs  10/24/15 2345 10/25/15 0313 10/25/15 0741  GLUCAP 108* 117* 116*    No results  found.   DISCUSSION: 82 yo female with acute encephalopathy and status epilepticus.  She has hx of CVA's and polypharmacy.  She required intubation for diprivan coma to control seizures.  2/12 recurrent szs with diprivan dosed decreased. Restarted on heavy sedation and anti-epileptics. Seizure activity resolved on cEEG  ASSESSMENT / PLAN:  PULMONARY A: Acute respiratory failure in setting of status epilepticus.  P:   Weaning this am cpa 5 ps 5, goal 1 hr Will discuss with family plan extubation and DNI vs consideration trach or extubation and if fail ett then trach No repeat pcxr needed  It appears that she has airway protection skills, and if wean well, could consider extubation trial  CARDIOVASCULAR A:  Hx of HTN, HLD. P:  Continue zocor tele  RENAL  A:   hypokalemia P:   Hypo k Avoid gross overload Even balance goals Last pcxr without edema  GASTROINTESTINAL A:   Nutrition. Chronic abdominal pain. P:   Tube feeds while on vent Protonix for SUP consider mirilax  Give dulc prn  HEMATOLOGIC A:   DVt prevention P:  F/u CBC Lovenox for DVT prevention  INFECTIOUS  A:   No Encephalitis. P:   Monitor off abx  ENDOCRINE A:   No acute issues. P:   Monitor CBGs  NEUROLOGIC A:   Acute encephalopathy 2nd to status epilepticus. Hx of anxiety, depression on chronic benzo's. Hx of CVA. Breakthrough szs when diprivan decreased  - off c-EEG, PLEDS have resolved P:   per neurology - dilantin, topamax, keppra, vimpat phenobarb per neuro Full WUA upright  GLOBAL 2/16 updated d in law; no family present at bedside 2/21 May need trach Family meeting 23rd today  Ccm time 30 min   Lavon Paganini. Titus Mould, MD, [place] Pgr: [place] [facility]

## 2015-10-26 LAB — GLUCOSE, CAPILLARY
GLUCOSE-CAPILLARY: 74 mg/dL (ref 65–99)
GLUCOSE-CAPILLARY: 78 mg/dL (ref 65–99)
GLUCOSE-CAPILLARY: 81 mg/dL (ref 65–99)
GLUCOSE-CAPILLARY: 96 mg/dL (ref 65–99)
Glucose-Capillary: 73 mg/dL (ref 65–99)
Glucose-Capillary: 74 mg/dL (ref 65–99)
Glucose-Capillary: 81 mg/dL (ref 65–99)

## 2015-10-26 LAB — BASIC METABOLIC PANEL
Anion gap: 8 (ref 5–15)
BUN: 25 mg/dL — ABNORMAL HIGH (ref 6–20)
CHLORIDE: 107 mmol/L (ref 101–111)
CO2: 25 mmol/L (ref 22–32)
CREATININE: 0.47 mg/dL (ref 0.44–1.00)
Calcium: 10.2 mg/dL (ref 8.9–10.3)
GFR calc non Af Amer: >60 mL/min (ref >60)
GLUCOSE: 89 mg/dL (ref 65–99)
Potassium: 3.4 mmol/L — ABNORMAL LOW (ref 3.5–5.1)
Sodium: 140 mmol/L (ref 135–145)

## 2015-10-26 LAB — PHOSPHORUS: Phosphorus: 3.5 mg/dL (ref 2.5–4.6)

## 2015-10-26 LAB — MAGNESIUM: MAGNESIUM: 1.9 mg/dL (ref 1.7–2.4)

## 2015-10-26 MED ORDER — POTASSIUM CHLORIDE 10 MEQ/50ML IV SOLN
10.0000 meq | INTRAVENOUS | Status: AC
  Administered 2015-10-26 (×3): 10 meq via INTRAVENOUS
  Filled 2015-10-26 (×2): qty 50

## 2015-10-26 MED ORDER — PHENOBARBITAL SODIUM 130 MG/ML IJ SOLN
32.5000 mg | Freq: Two times a day (BID) | INTRAMUSCULAR | Status: AC
  Administered 2015-10-26 – 2015-10-28 (×6): 32.5 mg via INTRAVENOUS
  Filled 2015-10-26 (×6): qty 1

## 2015-10-26 MED ORDER — PHENOBARBITAL SODIUM 65 MG/ML IJ SOLN
32.5000 mg | Freq: Every day | INTRAMUSCULAR | Status: DC
  Administered 2015-10-29: 32.5 mg via INTRAVENOUS
  Filled 2015-10-26: qty 1

## 2015-10-26 MED ORDER — BISACODYL 10 MG RE SUPP
10.0000 mg | Freq: Every day | RECTAL | Status: AC
Start: 2015-10-26 — End: 2015-10-28
  Administered 2015-10-26 – 2015-10-28 (×3): 10 mg via RECTAL
  Filled 2015-10-26 (×3): qty 1

## 2015-10-26 MED ORDER — MAGNESIUM SULFATE 50 % IJ SOLN
2.0000 g | Freq: Once | INTRAVENOUS | Status: AC
  Administered 2015-10-26: 2 g via INTRAVENOUS
  Filled 2015-10-26: qty 4

## 2015-10-26 MED ORDER — FLEET ENEMA 7-19 GM/118ML RE ENEM
1.0000 | ENEMA | Freq: Once | RECTAL | Status: AC
  Administered 2015-10-26: 1 via RECTAL
  Filled 2015-10-26: qty 1

## 2015-10-26 NOTE — Progress Notes (Signed)
Patient transferred from ICU. Patient and oriented x 2. Patient made comfortable.

## 2015-10-26 NOTE — Progress Notes (Signed)
PULMONARY / CRITICAL CARE MEDICINE   Name: DNYA SILVERMAN MRN: [id number] DOB: Feb 07, 1931    ADMISSION DATE:  10/08/2015 CONSULTATION DATE:  10/12/2015  REFERRING MD:  Dr. Janann Colonel, neurology  CHIEF COMPLAINT:  Seizures.  HISTORY OF PRESENT ILLNESS:   82 yo female brought to ER due to confusion after falling.  She was also having visual hallucinations.  She was noted to have seizure on 2/08 and neurology consulted.  She was also noted to have fever.  She had LP that was unrevealing.  She was started on abx for possible UTI, and acyclovir for possible encephalitis.  Her seizures persistent, and she was transferred to ICU for diprivan coma and intubation.  CULTURES: 2/07 Urine >> diphtheroids 2/07 Blood >>NGTD 2/09 CSF >>NGTD  ANTIBIOTICS: 2/06 Levaquin >>off 2/09 Acyclovir >> 2/11 2/10 Vanco>>off 2/10 ceftaz>> off 2/11 sputum>> candida  Tubes 2/10 ETT >> 2/23  STUDIES and events:  2/06 Admit 2/08 Noted to have seizure, neuro consulted 2/06 CT head >> atrophy 2/07 MRI brain >> remote basal ganglia and corona radiata infarcts b/l, remote Rt thalamus and posterior limb internal capsule infarcts, global atrophy 2/08 EEG >> lt temporal region focal seizures, lt hemisphere slowing 2/09 LP >> 140 RBC, 2 WBC, protein 38, glucose 51 2/10 Status epilepticus >> to ICU, diprivan coma  2./12 - Sedated on vent, szs overnight  2/13 - afebrile with ongoing c-eeg and diprivan 10/16/15 - c-EEG off. PEr tech - PLEDS +. c-versed gtt started by neuro per RN.  MRI - New signal abnormality in the left thalamus  2/15 EEG - PLEDS 2/17 - EEG - PLEDS improved 2/18 PLEDS resolved 2/23- DNR, DNI, no trach option, extubated successfuly  SUBJECTIVE/OVERNIGHT/INTERVAL HX Extubated, no distress, failed SLP Need BM  VITAL SIGNS: BP 147/85 mmHg  Pulse 80  Temp(Src) 98.2 F (36.8 C) (Axillary)  Resp 16  Ht 5\' 3"  (1.6 m)  Wt 54.6 kg (120 lb 5.9 oz)  BMI 21.33 kg/m2  SpO2 100%  HEMODYNAMICS:     VENTILATOR SETTINGS: Vent Mode:  [-] CPAP;PSV FiO2 (%):  [30 %] 30 % PEEP:  [5 cmH20] 5 cmH20 Pressure Support:  [5 cmH20] 5 cmH20  INTAKE / OUTPUT: I/O last 3 completed shifts: In: [id number] [I.V.:470; NG/GT:870; IV Piggyback:500] Out: 2620 [Urine:2620]  PHYSICAL EXAMINATION: General: no distress Neuro: opens eyes to pain, fc intermittent, slowly HEENT: JVD Cardiovascular:  Regular, no murmur s1 s2  Lungs:  ronchi resolved to cta reducec Abdomen:  Soft, nontender, no r/g Musculoskeletal:  No edema Skin:  No rashes  LABS:  PULMONARY No results for input(s): PHART, PCO2ART, PO2ART, HCO3, TCO2, O2SAT in the last 168 hours.  Invalid input(s): PCO2, PO2  CBC  Recent Labs Lab 10/22/15 0440 10/23/15 0521 10/24/15 1145  HGB 8.8* 8.2* 9.6*  HCT 28.3* 25.7* 29.0*  WBC 4.9 5.8 6.7  PLT 283 257 279    COAGULATION No results for input(s): INR in the last 168 hours.  CARDIAC  No results for input(s): TROPONINI in the last 168 hours. No results for input(s): PROBNP in the last 168 hours.   CHEMISTRY  Recent Labs Lab 10/21/15 0600 10/22/15 0440 10/23/15 0521 10/25/15 0550 10/26/15 0509  NA 141 140 141 140 140  K 3.3* 3.3* 3.3* 3.1* 3.4*  CL 106 107 107 107 107  CO2 26 26 25 24 25   GLUCOSE 128* 118* 123* 127* 89  BUN 27* 23* 26* 25* 25*  CREATININE 0.48 0.56 0.55 0.44 0.47  CALCIUM 9.5 9.5  9.2 9.3 10.2  MG  --   --  1.6*  --  1.9  PHOS  --   --  2.7  --  3.5   Estimated Creatinine Clearance: 44.1 mL/min (by C-G formula based on Cr of 0.47).   LIVER  Recent Labs Lab 10/25/15 0550  AST 106*  ALT 136*  ALKPHOS 69  BILITOT <0.1*  PROT 6.2*  ALBUMIN 2.5*     INFECTIOUS No results for input(s): LATICACIDVEN, PROCALCITON in the last 168 hours.   ENDOCRINE CBG (last 3)   Recent Labs  10/26/15 0338 10/26/15 0757 10/26/15 1133  GLUCAP 81 74 81    No results found.   DISCUSSION: 82 yo female with acute encephalopathy and status epilepticus.   She has hx of CVA's and polypharmacy.  She required intubation for diprivan coma to control seizures.  2/12 recurrent szs with diprivan dosed decreased. Restarted on heavy sedation and anti-epileptics. Seizure activity resolved on cEEG  ASSESSMENT / PLAN:  PULMONARY A: Acute respiratory failure in setting of status epilepticus. DNI P:   IS as able Upright Mobilize No escalation or reintubation, comfort if fails  CARDIOVASCULAR A:  Hx of HTN, HLD. P:  Continue zocor when access obtain oral Tele dc  RENAL  A:   Hypokalemia hypomag P:   K, mag supp kvi May need maintenance if NPO further  GASTROINTESTINAL A:   Nutrition. Chronic abdominal pain. constipation P:   Protonix for SUP, keep as HOME Med consider mirilax when able Requires enema Give dulc supp daily Repeat slp in am , if fails place cortrak and feed post pyloric  HEMATOLOGIC A:   DVt prevention P:  F/u CBC Lovenox for DVT prevention- maintain  INFECTIOUS  A:   No Encephalitis. P:   Monitor off abx  ENDOCRINE A:   No acute issues. P:   Monitor CBGs  NEUROLOGIC A:   Acute encephalopathy 2nd to status epilepticus. Hx of anxiety, depression on chronic benzo's. Hx of CVA. Breakthrough szs when diprivan decreased  - off c-EEG, PLEDS have resolved P:   per neurology - dilantin, topamax, keppra, vimpat See lft , follow in 2-3 days phenobarb per neuro, can we dc? Upright slp repeat Pt  To triad Updated son    Lavon Paganini. Titus Mould, MD, [place] Pgr: [facility]

## 2015-10-26 NOTE — Clinical Social Work Note (Signed)
Clinical Social Worker continuing to follow patient and family for support and discharge planning needs.  Patient was extubated yesterday and doing well from a respiratory standpoint.  CSW to update FL2 once patient has had MBS and plans are established for means of feeding.  Patient with available bed offers, however they were from prior to intubation.  CSW to initiate new search and follow up with patient family regarding available offers.  CSW remains available for support and to facilitate patient discharge needs once medically ready.  Barbette Or, [place]

## 2015-10-26 NOTE — Evaluation (Signed)
Physical Therapy Re-Evaluation Patient Details Name: Kathryn [name] MRN: [id number] DOB: [birth year] Today's Date: 10/26/2015   History of Present Illness  pt initially presented with Confusion and Hallucinations.  pt found to be encephalopathic and with UTI.  On 10/10/15 pt had seizure activity and contineud seizure activity that required sedation and intubation from 2/10 - 2/23.  pt with hx of CVA, Depression, HTN, Chronic Back Pain, and Anxiety.    Clinical Impression  Pt lethargic and with minimal participation in session.  Pt does attempt to verbalize during session, but very whispery and hard to understand.  Pt remains very flexed posture whether pt is lying or sitting, and question if baseline she is very kyphotic.  At this time feel pt will need SNF level of care and continued therapies.  Will continue to follow.      Follow Up Recommendations SNF    Equipment Recommendations  None recommended by PT    Recommendations for Other Services       Precautions / Restrictions Precautions Precautions: Fall Restrictions Weight Bearing Restrictions: No      Mobility  Bed Mobility Overal bed mobility: Needs Assistance;+2 for physical assistance Bed Mobility: Supine to Sit;Sit to Supine     Supine to sit: Total assist;+2 for physical assistance Sit to supine: Total assist;+2 for physical assistance   General bed mobility comments: pt not participating in bed mobility.  pt remains in very flexed position when brought to sitting.    Transfers                    Ambulation/Gait                Stairs            Wheelchair Mobility    Modified Rankin (Stroke Patients Only)       Balance Overall balance assessment: Needs assistance Sitting-balance support: No upper extremity supported;Feet supported Sitting balance-Leahy Scale: Zero Sitting balance - Comments: pt remains flexed and despite cues to A with maintaining balance, pt makes no effort to maintain  sitting.                                       Pertinent Vitals/Pain Pain Assessment: Faces Faces Pain Scale: No hurt    Home Living Family/patient expects to be discharged to:: Skilled nursing facility                      Prior Function Level of Independence: Needs assistance   Gait / Transfers Assistance Needed: Per pt amb modified independent. Reports she doesn't use walker because it makes her fall.           Hand Dominance   Dominant Hand: Right    Extremity/Trunk Assessment   Upper Extremity Assessment: Defer to OT evaluation           Lower Extremity Assessment: Generalized weakness;Difficult to assess due to impaired cognition      Cervical / Trunk Assessment: Kyphotic  Communication   Communication: Expressive difficulties (Whispered and difficult to understand.)  Cognition Arousal/Alertness: Lethargic Behavior During Therapy: Flat affect Overall Cognitive Status: Difficult to assess                 General Comments: pt did respond to her name, but overall lethargic and with very difficult to understand speech.  pt not consistently following directions.  General Comments      Exercises        Assessment/Plan    PT Assessment Patient needs continued PT services  PT Diagnosis Difficulty walking;Generalized weakness;Altered mental status   PT Problem List Decreased strength;Decreased activity tolerance;Decreased balance;Decreased mobility;Decreased coordination;Decreased cognition;Decreased knowledge of use of DME;Decreased safety awareness  PT Treatment Interventions DME instruction;Gait training;Functional mobility training;Therapeutic activities;Therapeutic exercise;Balance training;Neuromuscular re-education;Cognitive remediation;Patient/family education   PT Goals (Current goals can be found in the Care Plan section) Acute Rehab PT Goals Patient Stated Goal: Not stated PT Goal Formulation: Patient unable to  participate in goal setting Time For Goal Achievement: 11/09/15 Potential to Achieve Goals: Fair    Frequency Min 2X/week   Barriers to discharge        Co-evaluation               End of Session Equipment Utilized During Treatment: Oxygen Activity Tolerance: Patient limited by lethargy Patient left: in bed;with call bell/phone within reach;with bed alarm set Nurse Communication: Mobility status;Need for lift equipment         Time: 1030-1048 PT Time Calculation (min) (ACUTE ONLY): 18 min   Charges:   PT Evaluation $PT Eval Moderate Complexity: 1 Procedure     PT G CodesCatarina Hartshorn, [place] 10/26/2015, 12:23 PM

## 2015-10-26 NOTE — Progress Notes (Signed)
Nutrition Follow-up  INTERVENTION:   Supplement diet as appropriate.   NUTRITION DIAGNOSIS:   Inadequate oral intake related to inability to eat as evidenced by NPO status. Ongoing.   GOAL:   Patient will meet greater than or equal to 90% of their needs Not met.   MONITOR:   Diet advancement, I & O's  ASSESSMENT:   [name] with PMH of HTN, hyperlipidemia, GERD, hypothyroidism, depression, anxiety, TIA, stroke, recurrent UTI, chronic back pain, chronic abdominal pain, who presented with altered mental status and hallucination.  2/23 pt extubated 2/24 failed swallow, plan to check swallow tomorrow, no plans for tube at this time Pt discussed during ICU rounds and with RN.   Medications reviewed and include: Miralax, b12, vitamin c, vitamin e Labs reviewed: potassium low 3.4 Per MD note no resuscitation if arrest but continue current care.   Diet Order:  Diet NPO time specified  Skin:  Reviewed, no issues  Last BM:  2/16  Height:   Ht Readings from Last 1 Encounters:  10/21/15 5' 3"  (1.6 m)    Weight:   Wt Readings from Last 1 Encounters:  10/26/15 120 lb 5.9 oz (54.6 kg)    Ideal Body Weight:  52 kg  BMI:  Body mass index is 21.33 kg/(m^2).  Estimated Nutritional Needs:   Kcal:  1300-1500  Protein:  65-75 grams  Fluid:  >/= 1.5 L  EDUCATION NEEDS:   No education needs identified at this time   [place], [place], [place] Pager [phone] After Hours Pager

## 2015-10-26 NOTE — Evaluation (Signed)
Clinical/Bedside Swallow Evaluation Patient Details  Name: Kathryn [name] MRN: [id number] Date of Birth: 09/17/1930  Today's Date: 10/26/2015 Time: SLP Start Time (ACUTE ONLY): 0911 SLP Stop Time (ACUTE ONLY): 0932 SLP Time Calculation (min) (ACUTE ONLY): 21 min  Past Medical History:  Past Medical History  Diagnosis Date  . Depression   . Hypertension   . High cholesterol   . Rectal prolapse   . Hx of adenomatous colonic polyps   . Internal hemorrhoids   . Hypothyroidism   . Fecal incontinence   . TIA (transient ischemic attack)   . History of frequent urinary tract infections     recent  . Migraine   . Fatty liver 03/20/13  . Generalized ischemic cerebrovascular disease 10/09/2015  . Stroke [facility]) Sept 1, 2010; Sept 12, 2011   Past Surgical History:  Past Surgical History  Procedure Laterality Date  . Colon surgery  2010, for prolapsed organs after hystecrtomy surgery  . Appendectomy  2008  . Cholecystectomy    . Lumbar laminectomy/decompression microdiscectomy N/A 01/07/2013    Procedure: LUMBAR LAMINECTOMY CENTRAL DECOMPRESSION L4-L5, BILATERAL FORAMENOTOMY L4,L5    ;  Surgeon: Tobi Bastos, MD;  Location: [facility] ORS;  Service: Orthopedics;  Laterality: N/A;  . Back surgery    . Hemorrhoid surgery  09/2008    Archie Endo 12/31/2010  . Abdominal hysterectomy  05/2007    /notes 01/02/2011   HPI:  82 y.o. female with h/o HTN, HLD, GERD, hypothyroidism, depression, anxiety, TIA, CVA x2 (05/2009, 05/2010), recurrent UTI, chronic back pain and chronic abdominal pain, who presented to ED 2/6 with AMS and visual hallucinations. Pt noted to have seizure 2/8 and neurology consulted. Intubated and sedated from 2/10-2/23 (13 days).  MR Brain 2/7 remote infarcts basal ganglia and corona radiata bilaterally. Remote infarct at the junction of the R thalamus and posterior limb R internal capsule. CXR 2/21 no acute cardiopulmonary abnormality. No previous h/o SLP intervention noted in chart.   Assessment /  Plan / Recommendation Clinical Impression  Pt lethargic and confused upon SLP arrival requiring frequent verbal and tactile cues to arouse throughout evaluation. Pt exhibited immediate and delayed coughing for thin, nectar and honey thick liquids.Pt's alertness as well as increased risk factors of previous TIA and GERD make penetration/aspiration likely. Pt educated re: continued NPO status. SLP will f/u to determine PO readiness and possible need for instrumental study.    Aspiration Risk  Severe aspiration risk    Diet Recommendation NPO   Medication Administration: Crushed with puree    Other  Recommendations Oral Care Recommendations: Oral care BID   Follow up Recommendations   (TBD)    Frequency and Duration min 2x/week  2 weeks       Prognosis Prognosis for Safe Diet Advancement: Fair Barriers to Reach Goals: Severity of deficits;Behavior      Swallow Study   General HPI: 82 y.o. female with h/o HTN, HLD, GERD, hypothyroidism, depression, anxiety, TIA, CVA x2 (05/2009, 05/2010), recurrent UTI, chronic back pain and chronic abdominal pain, who presented to ED 2/6 with AMS and visual hallucinations. Pt noted to have seizure 2/8 and neurology consulted. Intubated and sedated from 2/10-2/23 (13 days).  MR Brain 2/7 remote infarcts basal ganglia and corona radiata bilaterally. Remote infarct at the junction of the R thalamus and posterior limb R internal capsule. CXR 2/21 no acute cardiopulmonary abnormality. No previous h/o SLP intervention noted in chart. Type of Study: Bedside Swallow Evaluation Previous Swallow Assessment: see HPI Diet Prior  to this Study: NPO Temperature Spikes Noted: Yes Respiratory Status: Room air History of Recent Intubation: Yes Length of Intubations (days): 13 days Date extubated: 10/25/15 Behavior/Cognition: Cooperative;Confused;Lethargic/Drowsy;Requires cueing Oral Cavity Assessment: Within Functional Limits Oral Care Completed by SLP: No Oral Cavity  - Dentition: Adequate natural dentition (partials) Vision: Functional for self-feeding Self-Feeding Abilities: Total assist Patient Positioning: Upright in bed Baseline Vocal Quality: Low vocal intensity Volitional Cough: Strong Volitional Swallow: Able to elicit    Oral/Motor/Sensory Function Overall Oral Motor/Sensory Function: Within functional limits   Ice Chips Ice chips: Not tested   Thin Liquid Thin Liquid: Impaired Presentation: Cup Oral Phase Impairments: Reduced labial seal Oral Phase Functional Implications: Left anterior spillage Pharyngeal  Phase Impairments: Cough - Immediate;Cough - Delayed    Nectar Thick Nectar Thick Liquid: Impaired Presentation: Cup Oral Phase Impairments:  (none) Pharyngeal Phase Impairments: Cough - Immediate;Cough - Delayed   Honey Thick Honey Thick Liquid: Impaired Presentation: Cup Oral Phase Impairments:  (none) Pharyngeal Phase Impairments: Cough - Immediate   Puree Puree: Within functional limits Presentation: Spoon   Solid   GO   Solid: Not tested        Titus Mould 10/26/2015,2:49 PM  Titus Mould, Student-SLP

## 2015-10-27 LAB — GLUCOSE, CAPILLARY
GLUCOSE-CAPILLARY: 74 mg/dL (ref 65–99)
GLUCOSE-CAPILLARY: 78 mg/dL (ref 65–99)
GLUCOSE-CAPILLARY: 77 mg/dL (ref 65–99)
Glucose-Capillary: 63 mg/dL — ABNORMAL LOW (ref 65–99)
Glucose-Capillary: 80 mg/dL (ref 65–99)
Glucose-Capillary: 82 mg/dL (ref 65–99)
Glucose-Capillary: 85 mg/dL (ref 65–99)

## 2015-10-27 LAB — BASIC METABOLIC PANEL
Anion gap: 15 (ref 5–15)
BUN: 18 mg/dL (ref 6–20)
CHLORIDE: 104 mmol/L (ref 101–111)
CO2: 22 mmol/L (ref 22–32)
CREATININE: 0.46 mg/dL (ref 0.44–1.00)
Calcium: 10.2 mg/dL (ref 8.9–10.3)
GFR calc Af Amer: >60 mL/min (ref >60)
Glucose, Bld: 97 mg/dL (ref 65–99)
Potassium: 3.3 mmol/L — ABNORMAL LOW (ref 3.5–5.1)
SODIUM: 141 mmol/L (ref 135–145)

## 2015-10-27 LAB — MAGNESIUM: MAGNESIUM: 1.7 mg/dL (ref 1.7–2.4)

## 2015-10-27 LAB — PHOSPHORUS: PHOSPHORUS: 2.7 mg/dL (ref 2.5–4.6)

## 2015-10-27 NOTE — Progress Notes (Signed)
Speech Language Pathology Treatment: Dysphagia  Patient Details Name: Kathryn [name] MRN: [id number] DOB: 05/31/1931 Today's Date: 10/27/2015 Time: [id number] SLP Time Calculation (min) (ACUTE ONLY): 16 min  Assessment / Plan / Recommendation Clinical Impression  Pt lethargic/drowsy when SLP arrived; aroused eventually with eyes remaining closed, but responding to questions with confusion apparent; pt consumed ice chips with moderate visual/verbal/tactile cues provided by SLP with oral holding and increased oral transit/propulsion with delayed cough noted after delayed initiation of the swallow; pt is not able to consume PO's safely at this time and NPO status is recommended until mentation improves overall.   HPI HPI: 82 y.o. female with h/o HTN, HLD, GERD, hypothyroidism, depression, anxiety, TIA, CVA x2 (05/2009, 05/2010), recurrent UTI, chronic back pain and chronic abdominal pain, who presented to ED 2/6 with AMS and visual hallucinations. Pt noted to have seizure 2/8 and neurology consulted. Intubated and sedated from 2/10-2/23 (13 days).  MR Brain 2/7 remote infarcts basal ganglia and corona radiata bilaterally. Remote infarct at the junction of the R thalamus and posterior limb R internal capsule. CXR 2/21 no acute cardiopulmonary abnormality. No previous h/o SLP intervention noted in chart.      SLP Plan  Continue with current plan of care     Recommendations  Diet recommendations: NPO Medication Administration: Crushed with puree             Oral Care Recommendations: Oral care QID Follow up Recommendations: Other (comment) (TBD) Plan: Continue with current plan of care                     Delon Revelo,PAT, M.S., CCC-SLP 10/27/2015, 11:22 AM

## 2015-10-27 NOTE — Progress Notes (Signed)
PULMONARY / CRITICAL CARE MEDICINE   Name: Kathryn [name] MRN: [id number] DOB: 28-May-1931    ADMISSION DATE:  10/08/2015 CONSULTATION DATE:  10/12/2015  REFERRING MD:  Dr. Janann Colonel, neurology  CHIEF COMPLAINT:  Seizures.  HISTORY OF PRESENT ILLNESS:   82 yo female brought to ER due to confusion after falling.  She was also having visual hallucinations.  She was noted to have seizure on 2/08 and neurology consulted.  She was also noted to have fever.  She had LP that was unrevealing.  She was started on abx for possible UTI, and acyclovir for possible encephalitis.  Her seizures persistent, and she was transferred to ICU for diprivan coma and intubation.  CULTURES: 2/07 Urine >> diphtheroids 2/07 Blood >>NGTD 2/09 CSF >>NGTD  ANTIBIOTICS: 2/06 Levaquin >>off 2/09 Acyclovir >> 2/11 2/10 Vanco>>off 2/10 ceftaz>> off 2/11 sputum>> candida  Tubes 2/10 ETT >> 2/23  STUDIES and events:  2/06 Admit 2/08 Noted to have seizure, neuro consulted 2/06 CT head >> atrophy 2/07 MRI brain >> remote basal ganglia and corona radiata infarcts b/l, remote Rt thalamus and posterior limb internal capsule infarcts, global atrophy 2/08 EEG >> lt temporal region focal seizures, lt hemisphere slowing 2/09 LP >> 140 RBC, 2 WBC, protein 38, glucose 51 2/10 Status epilepticus >> to ICU, diprivan coma  2./12 - Sedated on vent, szs overnight  2/13 - afebrile with ongoing c-eeg and diprivan 10/16/15 - c-EEG off. PEr tech - PLEDS +. c-versed gtt started by neuro per RN.  MRI - New signal abnormality in the left thalamus  2/15 EEG - PLEDS 2/17 - EEG - PLEDS improved 2/18 PLEDS resolved 2/23- DNR, DNI, no trach option, extubated successfuly  SUBJECTIVE/OVERNIGHT/INTERVAL HX Comfortable on floor RA  VITAL SIGNS: BP 176/75 mmHg  Pulse 74  Temp(Src) 98.6 F (37 C) (Oral)  Resp 18  Ht 5\' 3"  (1.6 m)  Wt 114 lb 11.2 oz (52.028 kg)  BMI 20.32 kg/m2  SpO2 95% RA  HEMODYNAMICS:    VENTILATOR SETTINGS:     INTAKE / OUTPUT: I/O last 3 completed shifts: In: 55 [I.V.:190; IV Piggyback:365] Out: 1871 [id number]; Stool:1]  PHYSICAL EXAMINATION: General: no distress Neuro: opens eyes to pain, fc intermittent, slowly HEENT: JVD Cardiovascular:  Regular, no murmur s1 s2  Lungs:  ronchi resolved to cta reducec Abdomen:  Soft, nontender, no r/g Musculoskeletal:  No edema Skin:  No rashes  LABS:  PULMONARY No results for input(s): PHART, PCO2ART, PO2ART, HCO3, TCO2, O2SAT in the last 168 hours.  Invalid input(s): PCO2, PO2  CBC  Recent Labs Lab 10/22/15 0440 10/23/15 0521 10/24/15 1145  HGB 8.8* 8.2* 9.6*  HCT 28.3* 25.7* 29.0*  WBC 4.9 5.8 6.7  PLT 283 257 279    COAGULATION No results for input(s): INR in the last 168 hours.  CARDIAC  No results for input(s): TROPONINI in the last 168 hours. No results for input(s): PROBNP in the last 168 hours.   CHEMISTRY  Recent Labs Lab 10/22/15 0440 10/23/15 0521 10/25/15 0550 10/26/15 0509 10/27/15 0412  NA 140 141 140 140 141  K 3.3* 3.3* 3.1* 3.4* 3.3*  CL 107 107 107 107 104  CO2 26 25 24 25 22   GLUCOSE 118* 123* 127* 89 97  BUN 23* 26* 25* 25* 18  CREATININE 0.56 0.55 0.44 0.47 0.46  CALCIUM 9.5 9.2 9.3 10.2 10.2  MG  --  1.6*  --  1.9 1.7  PHOS  --  2.7  --  3.5 2.7  Estimated Creatinine Clearance: 43.7 mL/min (by C-G formula based on Cr of 0.46).   LIVER  Recent Labs Lab 10/25/15 0550  AST 106*  ALT 136*  ALKPHOS 69  BILITOT <0.1*  PROT 6.2*  ALBUMIN 2.5*     INFECTIOUS No results for input(s): LATICACIDVEN, PROCALCITON in the last 168 hours.   ENDOCRINE CBG (last 3)   Recent Labs  10/27/15 0731 10/27/15 1109 10/27/15 1607  GLUCAP 82 80 74    No results found.   DISCUSSION: 82 yo female with acute encephalopathy and status epilepticus.  She has hx of CVA's and polypharmacy.  She required intubation for diprivan coma to control seizures.  2/12 recurrent szs with diprivan dosed  decreased. Restarted on heavy sedation and anti-epileptics. Seizure activity resolved on cEEG  ASSESSMENT / PLAN:  PULMONARY A: Acute respiratory failure in setting of status epilepticus, resolved  P:   IS as able Upright Mobilize No escalation or reintubation, comfort if fails  CARDIOVASCULAR A:  Hx of HTN, HLD. P:  Continue zocor when access obtain oral Tele dc  RENAL  A:   Hypokalemia hypomag P:   K, mag supp  May need maintenance if NPO further   GASTROINTESTINAL A:   Nutrition. Chronic abdominal pain. constipation P:   Protonix for SUP, keep as HOME Med consider mirilax when able Requires enema Give dulc supp daily Repeat slp in am , if fails place cortrak and feed post pyloric  HEMATOLOGIC A:   DVt prevention P:  F/u CBC Lovenox for DVT prevention- maintain  INFECTIOUS  A:   No Encephalitis. P:   Monitor off abx  ENDOCRINE A:   No acute issues. P:   Monitor CBGs  NEUROLOGIC A:   Acute encephalopathy 2nd to status epilepticus. Hx of anxiety, depression on chronic benzo's. Hx of CVA. Breakthrough szs when diprivan decreased  - off c-EEG, PLEDS have resolved P:   per neurology - dilantin, topamax, keppra, vimpat See lft , follow in 2-3 days phenobarb per neuro, can we dc? Upright slp repeat   Comfortable on floor, no escalation of care planned To [facility] 2/26   Christinia Gully, MD Pulmonary and [place] [phone] After 5:30 PM or weekends, call [phone]

## 2015-10-28 LAB — GLUCOSE, CAPILLARY
GLUCOSE-CAPILLARY: 142 mg/dL — AB (ref 65–99)
GLUCOSE-CAPILLARY: 85 mg/dL (ref 65–99)
GLUCOSE-CAPILLARY: 97 mg/dL (ref 65–99)
Glucose-Capillary: 87 mg/dL (ref 65–99)
Glucose-Capillary: 94 mg/dL (ref 65–99)
Glucose-Capillary: 91 mg/dL (ref 65–99)

## 2015-10-28 MED ORDER — DEXTROSE 50 % IV SOLN
INTRAVENOUS | Status: AC
  Administered 2015-10-28: 25 mL
  Filled 2015-10-28: qty 50

## 2015-10-28 MED ORDER — DEXTROSE-NACL 5-0.45 % IV SOLN
INTRAVENOUS | Status: DC
  Administered 2015-10-28 – 2015-10-30 (×2): via INTRAVENOUS

## 2015-10-28 MED ORDER — DEXTROSE 50 % IV SOLN: 25.0000 mL | Freq: Once | INTRAVENOUS | Status: AC

## 2015-10-28 MED ORDER — ALTEPLASE 2 MG IJ SOLR
2.0000 mg | Freq: Once | INTRAMUSCULAR | Status: AC
  Administered 2015-10-28: 2 mg
  Filled 2015-10-28: qty 2

## 2015-10-28 MED ORDER — PROPRANOLOL HCL 40 MG PO TABS
40.0000 mg | ORAL_TABLET | Freq: Three times a day (TID) | ORAL | Status: DC
  Administered 2015-10-28 – 2015-11-03 (×13): 40 mg via ORAL
  Filled 2015-10-28 (×21): qty 1

## 2015-10-28 MED ORDER — LOSARTAN POTASSIUM 50 MG PO TABS
100.0000 mg | ORAL_TABLET | Freq: Every day | ORAL | Status: DC
  Administered 2015-10-30 – 2015-11-03 (×5): 100 mg via ORAL
  Filled 2015-10-28 (×5): qty 2

## 2015-10-28 MED ORDER — AMLODIPINE BESYLATE 5 MG PO TABS
5.0000 mg | ORAL_TABLET | Freq: Every day | ORAL | Status: DC
  Administered 2015-10-30 – 2015-11-03 (×5): 5 mg via ORAL
  Filled 2015-10-28 (×5): qty 1

## 2015-10-28 MED ORDER — DEXTROSE-NACL 5-0.45 % IV SOLN
INTRAVENOUS | Status: DC
  Filled 2015-10-28: qty 1000

## 2015-10-28 NOTE — Progress Notes (Signed)
[facility] PROGRESS NOTE  Kathryn [name] [id number] DOB: 1930-10-07 DOA: 10/08/2015 PCP: Lilian Coma, MD  Assessment/Plan:  Status epilepticus-  patient was admitted to the ICU on 10/09/2015 with prolonged status epilepticus, was intubated and sedated with Diprivan,  was followed by neurology underwent continuous EEG monitoring which has been discontinued. Patient's seizures are controlled with multiple anticonvulsant regimen including Vimpat, Keppra, phenobarbital, Dilantin, Topamax. Neurology has recommended to taper off phenobarbital. Phenobarbital taper order has been placed.\  Nutrition- patient is currently nothing by mouth due to aspiration risk. Has been followed by speech therapy and still has risk for aspiration. I called and discussed with patient's son to consider feeding tube cor track. He agrees to put the feeding tube. We'll order cortak for am. In the meantime start D5 half-normal saline at 50 mL per hour.  Acute respiratory failure in setting of status epilepticus- resolved  Hypertension- blood pressure is elevated, will restart her medications if she is alert enough to swallow. Meds to be crushed with purely as per speech therapy recommendations. Will restart amlodipine, Cozaar, propranolol. Continue hydralazine 5 mg IV every 2 hours when necessary for SBP more than 175.  DVT prophylaxis- Lovenox   Code Status: DO NOT RESUSCITATE Family Communication: **Discussed with patient's son on phone Disposition Plan: Skilled nursing facility   Consultants:  Neurology  Pulmonary  Procedures: CULTURES: 2/07 Urine >> diphtheroids 2/07 Blood >>NGTD 2/09 CSF >>NGTD  ANTIBIOTICS: 2/06 Levaquin >>off 2/09 Acyclovir >> 2/11 2/10 Vanco>>off 2/10 ceftaz>> off 2/11 sputum>> candida  Tubes 2/10 ETT >> 2/23  STUDIES and events:  2/06 Admit 2/08 Noted to have seizure, neuro consulted 2/06 CT head >> atrophy 2/07 MRI brain >> remote basal ganglia and corona  radiata infarcts b/l, remote Rt thalamus and posterior limb internal capsule infarcts, global atrophy 2/08 EEG >> lt temporal region focal seizures, lt hemisphere slowing 2/09 LP >> 140 RBC, 2 WBC, protein 38, glucose 51 2/10 Status epilepticus >> to ICU, diprivan coma  2./12 - Sedated on vent, szs overnight  2/13 - afebrile with ongoing c-eeg and diprivan 10/16/15 - c-EEG off. PEr tech - PLEDS +. c-versed gtt started by neuro per RN.  MRI - New signal abnormality in the left thalamus  2/15 EEG - PLEDS 2/17 - EEG - PLEDS improved 2/18 PLEDS resolved 2/23- DNR, DNI, no trach option, extubated successfuly   Antibiotics: None  HPI/Subjective: 82 yo female brought to ER due to confusion after falling. She was also having visual hallucinations. She was noted to have seizure on 2/08 and neurology consulted. She was also noted to have fever. She had LP that was unrevealing. She was started on abx for possible UTI, and acyclovir for possible encephalitis. Her seizures persistent, and she was transferred to ICU for diprivan coma and intubation. A shunt was sedated on Diprivan and intubated. EEG performed on 10/17/2015 showed periodic lateralized epileptiform discharges (PLED's). Which resolved on 10/20/2015. Patient was extubated after discussion with family and was made DO NOT RESUSCITATE. She had failed swallow evaluation so was kept nothing by mouth.  Patient lethargic this morning.  Objective: Filed Vitals:   10/28/15 0922 10/28/15 1337  BP: 183/84 179/86  Pulse: 90 88  Temp: 98.4 F (36.9 C) 98.5 F (36.9 C)  Resp: 14 16    Intake/Output Summary (Last 24 hours) at 10/28/15 1414 Last data filed at 10/28/15 0416  Gross per 24 hour  Intake      0 ml  Output    830 ml  Net   -830 ml   Filed Weights   10/26/15 0500 10/27/15 0453 10/28/15 0500  Weight: 54.6 kg (120 lb 5.9 oz) 52.028 kg (114 lb 11.2 oz) 53.615 kg (118 lb 3.2 oz)    Exam:   General:  Appears lethargic,  responds to verbal  stimuli  Cardiovascular: S1S2 RRR  Respiratory: Clear bilaterally  Abdomen: Soft, nontender  Musculoskeletal: No edema of the lower extremities  Data Reviewed: Basic Metabolic Panel:  Recent Labs Lab 10/22/15 0440 10/23/15 0521 10/25/15 0550 10/26/15 0509 10/27/15 0412  NA 140 141 140 140 141  K 3.3* 3.3* 3.1* 3.4* 3.3*  CL 107 107 107 107 104  CO2 26 25 24 25 22   GLUCOSE 118* 123* 127* 89 97  BUN 23* 26* 25* 25* 18  CREATININE 0.56 0.55 0.44 0.47 0.46  CALCIUM 9.5 9.2 9.3 10.2 10.2  MG  --  1.6*  --  1.9 1.7  PHOS  --  2.7  --  3.5 2.7   Liver Function Tests:  Recent Labs Lab 10/25/15 0550  AST 106*  ALT 136*  ALKPHOS 69  BILITOT <0.1*  PROT 6.2*  ALBUMIN 2.5*   CBC:  Recent Labs Lab 10/22/15 0440 10/23/15 0521 10/24/15 1145  WBC 4.9 5.8 6.7  NEUTROABS  --   --  5.1  HGB 8.8* 8.2* 9.6*  HCT 28.3* 25.7* 29.0*  MCV 95.3 96.3 95.1  PLT 283 257 279   Cardiac Enzymes: No results for input(s): CKTOTAL, CKMB, CKMBINDEX, TROPONINI in the last 168 hours. BNP (last 3 results)  Recent Labs  10/09/15 0228  BNP 64.5    ProBNP (last 3 results) No results for input(s): PROBNP in the last 8760 hours.  CBG:  Recent Labs Lab 10/27/15 2349 10/28/15 0015 10/28/15 0410 10/28/15 0720 10/28/15 1107  GLUCAP 63* 142* 87 94 91    No results found for this or any previous visit (from the past 240 hour(s)).   Studies: No results found.  Scheduled Meds: . antiseptic oral rinse  7 mL Mouth Rinse 10 times per day  . chlorhexidine gluconate  15 mL Mouth Rinse BID  . enoxaparin (LOVENOX) injection  40 mg Subcutaneous Q24H  . insulin aspart  0-15 Units Subcutaneous 6 times per day  . lacosamide (VIMPAT) IV  100 mg Intravenous Q12H  . levETIRAcetam  1,500 mg Intravenous Q12H  . nystatin-triamcinolone  1 application Topical BID  . pantoprazole (PROTONIX) IV  40 mg Intravenous Q24H  . PHENObarbital  32.5 mg Intravenous BID  . [START ON  10/29/2015] PHENObarbital  32.5 mg Intravenous Daily  . phenytoin (DILANTIN) IV  50 mg Intravenous Q12H  . polyethylene glycol  17 g Oral Daily  . simvastatin  40 mg Per Tube q morning - 10a  . sodium chloride flush  10-40 mL Intracatheter Q12H  . sodium chloride flush  3 mL Intravenous Q12H  . topiramate  100 mg Per Tube BID  . vitamin B-12  1,000 mcg Per Tube Daily  . vitamin C  500 mg Per Tube Daily  . vitamin e  200 Units Per Tube Daily   Continuous Infusions: . dextrose 5 % and 0.45% NaCl 1,000 mL infusion      Principal Problem:   Acute encephalopathy Active Problems:   Spinal stenosis, lumbar region, with neurogenic claudication   Generalized anxiety disorder   GERD (gastroesophageal reflux disease)   Essential hypertension, benign   Recurrent falls   History of stroke   Polypharmacy  Generalized ischemic cerebrovascular disease   Chronic abdominal pain   Status epilepticus ([place])   Encephalopathy   Endotracheal tube present   Acute respiratory failure with hypoxia ([facility])   Acute respiratory failure ([place])   Seizure ([place])    Time spent: 25 min    [facility] Pager [phone]. If 7PM-7AM, please contact night-coverage at [email or web address], password [email or web address] 10/28/2015, 2:14 PM  LOS: 19 days

## 2015-10-28 NOTE — Progress Notes (Signed)
Speech Language Pathology Treatment: Dysphagia  Patient Details Name: Kathryn [name] MRN: [id number] DOB: 1931/03/20 Today's Date: 10/28/2015 Time: [id number] SLP Time Calculation (min) (ACUTE ONLY): 18 min  Assessment / Plan / Recommendation Clinical Impression  Pt seen for dysphagia treatment- PO trials. Pt needed moderate cues for arousal at bedside. Noticed pt had large amount of phlegm sitting on left shoulder and mouth also full of phlegm- removed during oral care with suction. Pt able to follow simple 1-step instructions during oral care (open mouth, close mouth, cough). Pt attempting to communicate but seemed to be language of confusion (very quiet). Attempted 2 trials of ice chips; both instances pt needed cues to stay alert and ice chip sat on front of tongue- no awareness of bolus by pt despite max cues. Pt continues to be at high risk of aspiration given current alertness level/ cognitive status. Recommend continuing NPO status with frequent oral care. Will continue to follow.    HPI HPI: 82 y.o. female with h/o HTN, HLD, GERD, hypothyroidism, depression, anxiety, TIA, CVA x2 (05/2009, 05/2010), recurrent UTI, chronic back pain and chronic abdominal pain, who presented to ED 2/6 with AMS and visual hallucinations. Pt noted to have seizure 2/8 and neurology consulted. Intubated and sedated from 2/10-2/23 (13 days).  MR Brain 2/7 remote infarcts basal ganglia and corona radiata bilaterally. Remote infarct at the junction of the R thalamus and posterior limb R internal capsule. CXR 2/21 no acute cardiopulmonary abnormality. No previous h/o SLP intervention noted in chart.      SLP Plan  Continue with current plan of care     Recommendations  Diet recommendations: NPO Medication Administration: Crushed with puree (only if alert) Postural Changes and/or Swallow Maneuvers: Seated upright 90 degrees (for meds)             Oral Care Recommendations: Oral care QID Follow up Recommendations:  Other (comment) (TBD) Plan: Continue with current plan of care     [place], Leyana Whidden K, MA, CCC-SLP 10/28/2015, 1:52 PM  [phone]

## 2015-10-28 NOTE — Progress Notes (Signed)
Hypoglycemic Event  CBG: 63  Treatment: 44ml of 50%Dextrose solution  Symptoms: sweating  Follow-up CBG: Time:00:14 CBG Result:142  Possible Reasons for Event: patient is NPO; Feeding tube d/c for palliative care  Comments/MD notified:yes    Kathryn [name] L

## 2015-10-29 ENCOUNTER — Inpatient Hospital Stay (HOSPITAL_COMMUNITY): Payer: Medicare Other

## 2015-10-29 LAB — GLUCOSE, CAPILLARY
GLUCOSE-CAPILLARY: 121 mg/dL — AB (ref 65–99)
GLUCOSE-CAPILLARY: 87 mg/dL (ref 65–99)
GLUCOSE-CAPILLARY: 97 mg/dL (ref 65–99)
Glucose-Capillary: 97 mg/dL (ref 65–99)
Glucose-Capillary: 119 mg/dL — ABNORMAL HIGH (ref 65–99)
Glucose-Capillary: 120 mg/dL — ABNORMAL HIGH (ref 65–99)

## 2015-10-29 LAB — BASIC METABOLIC PANEL
ANION GAP: 11 (ref 5–15)
BUN: 21 mg/dL — ABNORMAL HIGH (ref 6–20)
CHLORIDE: 105 mmol/L (ref 101–111)
CO2: 25 mmol/L (ref 22–32)
Calcium: 9.5 mg/dL (ref 8.9–10.3)
Creatinine, Ser: 0.54 mg/dL (ref 0.44–1.00)
GFR calc non Af Amer: >60 mL/min (ref >60)
Glucose, Bld: 102 mg/dL — ABNORMAL HIGH (ref 65–99)
Potassium: 2.9 mmol/L — ABNORMAL LOW (ref 3.5–5.1)
Sodium: 141 mmol/L (ref 135–145)

## 2015-10-29 LAB — CBC
HEMATOCRIT: 32.1 % — AB (ref 36.0–46.0)
HEMOGLOBIN: 10.5 g/dL — AB (ref 12.0–15.0)
MCH: 30.5 pg (ref 26.0–34.0)
MCHC: 32.7 g/dL (ref 30.0–36.0)
MCV: 93.3 fL (ref 78.0–100.0)
Platelets: 266 10*3/uL (ref 150–400)
RBC: 3.44 MIL/uL — ABNORMAL LOW (ref 3.87–5.11)
RDW: 13.5 % (ref 11.5–15.5)
WBC: 3.6 10*3/uL — AB (ref 4.0–10.5)

## 2015-10-29 MED ORDER — LACOSAMIDE 200 MG/20ML IV SOLN
50.0000 mg | Freq: Two times a day (BID) | INTRAVENOUS | Status: DC
  Administered 2015-10-29 – 2015-10-30 (×2): 50 mg via INTRAVENOUS
  Filled 2015-10-29 (×4): qty 5

## 2015-10-29 MED ORDER — POTASSIUM CHLORIDE 10 MEQ/100ML IV SOLN
10.0000 meq | INTRAVENOUS | Status: AC
  Administered 2015-10-29 (×3): 10 meq via INTRAVENOUS
  Filled 2015-10-29 (×3): qty 100

## 2015-10-29 MED ORDER — SODIUM CHLORIDE 0.9 % IV SOLN
1000.0000 mg | Freq: Two times a day (BID) | INTRAVENOUS | Status: DC
  Administered 2015-10-30 – 2015-10-31 (×4): 1000 mg via INTRAVENOUS
  Filled 2015-10-29 (×8): qty 10

## 2015-10-29 MED ORDER — ACETAMINOPHEN 650 MG RE SUPP
650.0000 mg | Freq: Four times a day (QID) | RECTAL | Status: DC | PRN
  Administered 2015-10-29 – 2015-10-30 (×2): 650 mg via RECTAL
  Filled 2015-10-29 (×2): qty 1

## 2015-10-29 NOTE — Care Management Note (Signed)
Case Management Note  Patient Details  Name: Kathryn [name] MRN: [id number] Date of Birth: 1931/08/31  Subjective/Objective:                    Action/Plan: Plan is for SNF when patient medically stable. CM continuing to follow for discharge needs.   Expected Discharge Date:                  Expected Discharge Plan:  Skilled Nursing Facility  In-House Referral:  Clinical Social Work  Discharge planning Services  CM Consult  Post Acute Care Choice:    Choice offered to:     DME Arranged:    DME Agency:     HH Arranged:    [place] Agency:     Status of Service:  In process, will continue to follow  Medicare Important Message Given:  Yes Date Medicare IM Given:    Medicare IM give by:    Date Additional Medicare IM Given:    Additional Medicare Important Message give by:     If discussed at [place] of Stay Meetings, dates discussed:    Additional Comments:  Pollie Friar, RN 10/29/2015, 3:04 PM

## 2015-10-29 NOTE — Progress Notes (Signed)
[facility] PROGRESS NOTE  Kathryn [name] [id number] DOB: Aug 14, 1931 DOA: 10/08/2015 PCP: Lilian Coma, MD  Assessment/Plan:  Status epilepticus-  patient was admitted to the ICU on 10/09/2015 with prolonged status epilepticus, was intubated and sedated with Diprivan,  was followed by neurology underwent continuous EEG monitoring which has been discontinued. Patient's seizures are controlled with multiple anticonvulsant regimen including Vimpat, Keppra, phenobarbital, Dilantin, Topamax. Neurology has recommended to taper off phenobarbital. Phenobarbital taper order has been placed.\  Nutrition- patient is currently nothing by mouth due to aspiration risk. Has been followed by speech therapy and still has risk for aspiration. I called and discussed with patient's son to consider feeding tube cor track. He agrees to put the feeding tube. We'll order cortak for am. In the meantime start D5 half-normal saline at 50 mL per hour.  Hypokalemia- will start KCL 10 meq IV x3. Check bmp in am.  Acute respiratory failure in setting of status epilepticus- resolved  Hypertension- blood pressure is elevated, will restart her medications if she is alert enough to swallow. Meds to be crushed with purely as per speech therapy recommendations. Will restart amlodipine, Cozaar, propranolol. Continue hydralazine 5 mg IV every 2 hours when necessary for SBP more than 175.  DVT prophylaxis- Lovenox   Code Status: DO NOT RESUSCITATE Family Communication: **Discussed with patient's son on phone Disposition Plan: Skilled nursing facility   Consultants:  Neurology  Pulmonary  Procedures: CULTURES: 2/07 Urine >> diphtheroids 2/07 Blood >>NGTD 2/09 CSF >>NGTD  ANTIBIOTICS: 2/06 Levaquin >>off 2/09 Acyclovir >> 2/11 2/10 Vanco>>off 2/10 ceftaz>> off 2/11 sputum>> candida  Tubes 2/10 ETT >> 2/23  STUDIES and events:  2/06 Admit 2/08 Noted to have seizure, neuro consulted 2/06 CT head >>  atrophy 2/07 MRI brain >> remote basal ganglia and corona radiata infarcts b/l, remote Rt thalamus and posterior limb internal capsule infarcts, global atrophy 2/08 EEG >> lt temporal region focal seizures, lt hemisphere slowing 2/09 LP >> 140 RBC, 2 WBC, protein 38, glucose 51 2/10 Status epilepticus >> to ICU, diprivan coma  2./12 - Sedated on vent, szs overnight  2/13 - afebrile with ongoing c-eeg and diprivan 10/16/15 - c-EEG off. PEr tech - PLEDS +. c-versed gtt started by neuro per RN.  MRI - New signal abnormality in the left thalamus  2/15 EEG - PLEDS 2/17 - EEG - PLEDS improved 2/18 PLEDS resolved 2/23- DNR, DNI, no trach option, extubated successfuly   Antibiotics: None  HPI/Subjective: 82 yo female brought to ER due to confusion after falling. She was also having visual hallucinations. She was noted to have seizure on 2/08 and neurology consulted. She was also noted to have fever. She had LP that was unrevealing. She was started on abx for possible UTI, and acyclovir for possible encephalitis. Her seizures persistent, and she was transferred to ICU for diprivan coma and intubation. A shunt was sedated on Diprivan and intubated. EEG performed on 10/17/2015 showed periodic lateralized epileptiform discharges (PLED's). Which resolved on 10/20/2015. Patient was extubated after discussion with family and was made DO NOT RESUSCITATE. She had failed swallow evaluation so was kept nothing by mouth.  Patient lethargic this morning.Does not communicate.  Objective: Filed Vitals:   10/29/15 1033 10/29/15 1454  BP: 168/88 161/71  Pulse: 95 101  Temp: 99.1 F (37.3 C) 97.8 F (36.6 C)  Resp: 18 18    Intake/Output Summary (Last 24 hours) at 10/29/15 1605 Last data filed at 10/29/15 0636  Gross per 24 hour  Intake      0 ml  Output    200 ml  Net   -200 ml   Filed Weights   10/27/15 0453 10/28/15 0500 10/29/15 0500  Weight: 52.028 kg (114 lb 11.2 oz) 53.615 kg (118 lb  3.2 oz) 52.753 kg (116 lb 4.8 oz)    Exam:   General:  Appears lethargic, responds to verbal  stimuli  Cardiovascular: S1S2 RRR  Respiratory: Clear bilaterally  Abdomen: Soft, nontender  Musculoskeletal: No edema of the lower extremities  Data Reviewed: Basic Metabolic Panel:  Recent Labs Lab 10/23/15 0521 10/25/15 0550 10/26/15 0509 10/27/15 0412 10/29/15 0354  NA 141 140 140 141 141  K 3.3* 3.1* 3.4* 3.3* 2.9*  CL 107 107 107 104 105  CO2 25 24 25 22 25   GLUCOSE 123* 127* 89 97 102*  BUN 26* 25* 25* 18 21*  CREATININE 0.55 0.44 0.47 0.46 0.54  CALCIUM 9.2 9.3 10.2 10.2 9.5  MG 1.6*  --  1.9 1.7  --   PHOS 2.7  --  3.5 2.7  --    Liver Function Tests:  Recent Labs Lab 10/25/15 0550  AST 106*  ALT 136*  ALKPHOS 69  BILITOT <0.1*  PROT 6.2*  ALBUMIN 2.5*   CBC:  Recent Labs Lab 10/23/15 0521 10/24/15 1145 10/29/15 0354  WBC 5.8 6.7 3.6*  NEUTROABS  --  5.1  --   HGB 8.2* 9.6* 10.5*  HCT 25.7* 29.0* 32.1*  MCV 96.3 95.1 93.3  PLT 257 279 266   Cardiac Enzymes: No results for input(s): CKTOTAL, CKMB, CKMBINDEX, TROPONINI in the last 168 hours. BNP (last 3 results)  Recent Labs  10/09/15 0228  BNP 64.5    ProBNP (last 3 results) No results for input(s): PROBNP in the last 8760 hours.  CBG:  Recent Labs Lab 10/28/15 2016 10/29/15 0010 10/29/15 0342 10/29/15 0813 10/29/15 1150  GLUCAP 97 87 97 119* 121*    No results found for this or any previous visit (from the past 240 hour(s)).   Studies: No results found.  Scheduled Meds: . amLODipine  5 mg Oral Daily  . antiseptic oral rinse  7 mL Mouth Rinse 10 times per day  . chlorhexidine gluconate  15 mL Mouth Rinse BID  . enoxaparin (LOVENOX) injection  40 mg Subcutaneous Q24H  . insulin aspart  0-15 Units Subcutaneous 6 times per day  . lacosamide (VIMPAT) IV  100 mg Intravenous Q12H  . levETIRAcetam  1,500 mg Intravenous Q12H  . losartan  100 mg Oral Daily  .  nystatin-triamcinolone  1 application Topical BID  . pantoprazole (PROTONIX) IV  40 mg Intravenous Q24H  . PHENObarbital  32.5 mg Intravenous Daily  . phenytoin (DILANTIN) IV  50 mg Intravenous Q12H  . polyethylene glycol  17 g Oral Daily  . propranolol  40 mg Oral TID  . simvastatin  40 mg Per Tube q morning - 10a  . sodium chloride flush  10-40 mL Intracatheter Q12H  . sodium chloride flush  3 mL Intravenous Q12H  . topiramate  100 mg Per Tube BID  . vitamin B-12  1,000 mcg Per Tube Daily  . vitamin C  500 mg Per Tube Daily  . vitamin e  200 Units Per Tube Daily   Continuous Infusions: . dextrose 5 % and 0.45% NaCl 50 mL/hr at 10/28/15 1644    Principal Problem:   Acute encephalopathy Active Problems:   Spinal stenosis, lumbar region, with neurogenic claudication  Generalized anxiety disorder   GERD (gastroesophageal reflux disease)   Essential hypertension, benign   Recurrent falls   History of stroke   Polypharmacy   Generalized ischemic cerebrovascular disease   Chronic abdominal pain   Status epilepticus ([place])   Encephalopathy   Endotracheal tube present   Acute respiratory failure with hypoxia ([place])   Acute respiratory failure ([place])   Seizure ([place])    Time spent: 25 min    [facility] Pager [phone]. If 7PM-7AM, please contact night-coverage at [email or web address], password [facility] 10/29/2015, 4:05 PM  LOS: 20 days

## 2015-10-29 NOTE — Care Management Important Message (Signed)
Important Message  Patient Details  Name: Kathryn [name] MRN: [id number] Date of Birth: 1931/07/20   Medicare Important Message Given:  Yes    Athira Janowicz P Rozanne Heumann 10/29/2015, 4:44 PM

## 2015-10-29 NOTE — Progress Notes (Signed)
Interval History:                                                                                                                      Kathryn [name] is an 82 y.o. female patient was resolved status epilepticus, recovering from encephalopathy. No silk and change clinically. She is very drowsy. Mumbles words, and said hello with tactile stimulation. No family at bedside.    Past Medical History: Past Medical History  Diagnosis Date  . Depression   . Hypertension   . High cholesterol   . Rectal prolapse   . Hx of adenomatous colonic polyps   . Internal hemorrhoids   . Hypothyroidism   . Fecal incontinence   . TIA (transient ischemic attack)   . History of frequent urinary tract infections     recent  . Migraine   . Fatty liver 03/20/13  . Generalized ischemic cerebrovascular disease 10/09/2015  . Stroke [facility]) Sept 1, 2010; Sept 12, 2011    Past Surgical History  Procedure Laterality Date  . Colon surgery  2010, for prolapsed organs after hystecrtomy surgery  . Appendectomy  2008  . Cholecystectomy    . Lumbar laminectomy/decompression microdiscectomy N/A 01/07/2013    Procedure: LUMBAR LAMINECTOMY CENTRAL DECOMPRESSION L4-L5, BILATERAL FORAMENOTOMY L4,L5    ;  Surgeon: Tobi Bastos, MD;  Location: [facility];  Service: Orthopedics;  Laterality: N/A;  . Back surgery    . Hemorrhoid surgery  09/2008    Archie Endo 12/31/2010  . Abdominal hysterectomy  05/2007    Archie Endo 01/02/2011    Family History: Family History  Problem Relation Age of Onset  . Stroke Father   . Migraines Father   . CVA Father   . Heart attack Father   . Hypertension Maternal Aunt     x3  . Colon cancer Neg Hx   . CVA Mother     Social History:   reports that she has never smoked. She has never used smokeless tobacco. She reports that she does not drink alcohol or use illicit drugs.  Allergies:  Allergies  Allergen Reactions  . Augmentin [Amoxicillin-Pot Clavulanate] Swelling and Diarrhea    Throat   .  Citalopram Swelling    Eyes ears and throat swelling Throat and eyes   . Codeine Anaphylaxis and Shortness Of Breath  . Fish-Derived Products Anaphylaxis  . Hyoscyamine Anaphylaxis and Swelling  . Ibuprofen Swelling    Throat and eyes   . Latex Swelling and Rash    Swelling to troat  . Lipitor [Atorvastatin] Swelling    Muscle aches throat  . Septra [Bactrim] Anaphylaxis  . Shellfish Allergy Anaphylaxis  . Miconazole Rash  . Keflet [Cephalexin] Diarrhea    Upset stomach  . Meloxicam Diarrhea and Nausea And Vomiting  . Sulfa Antibiotics     Rash and also throat was tight  . Verapamil     Tachycardia and flushing  . Vicodin [Hydrocodone-Acetaminophen] Other (See Comments)    stroke  . Zoloft [  Sertraline Hcl] Swelling    Red spots all over face, swelling of tongue and legs.   . Ciprofloxacin Nausea And Vomiting    Other reaction(s): Abdominal Pain  . Depakene [Valproate Sodium] Hives    All over body   . Isoptin Sr [Verapamil Hcl Er] Cough  . Lisinopril Cough    Fatigue and cough  . Telmisartan-Hctz Cough     Medications:                                                                                                                         Current facility-administered medications:  .  Place/Maintain arterial line, , , Until Discontinued **AND** 0.9 %  sodium chloride infusion, , Intra-arterial, PRN, Javier Glazier, MD, Last Rate: 10 mL/hr at 10/23/15 2000 .  acetaminophen (TYLENOL) solution 650 mg, 650 mg, Oral, Q6H PRN, Chesley Mires, MD, 650 mg at 10/28/15 2355 .  acetaminophen (TYLENOL) suppository 650 mg, 650 mg, Rectal, Q6H PRN, Gardiner Barefoot, NP, 650 mg at 10/29/15 2045 .  amLODipine (NORVASC) tablet 5 mg, 5 mg, Oral, Daily, Oswald Hillock, MD, 5 mg at 10/28/15 1500 .  antiseptic oral rinse solution (CORINZ), 7 mL, Mouth Rinse, 10 times per day, Chesley Mires, MD, 7 mL at 10/29/15 1744 .  chlorhexidine gluconate (PERIDEX) 0.12 % solution 15 mL, 15 mL, Mouth  Rinse, BID, Chesley Mires, MD, 15 mL at 10/29/15 2024 .  dextrose 5 %-0.45 % sodium chloride infusion, , Intravenous, Continuous, Oswald Hillock, MD, Last Rate: 50 mL/hr at 10/28/15 1644 .  enoxaparin (LOVENOX) injection 40 mg, 40 mg, Subcutaneous, Q24H, Raelyn Ensign Sumner, DO, 40 mg at 10/29/15 1004 .  fentaNYL (SUBLIMAZE) injection 50 mcg, 50 mcg, Intravenous, Q2H PRN, Chesley Mires, MD, 50 mcg at 10/21/15 0133 .  hydrALAZINE (APRESOLINE) injection 5 mg, 5 mg, Intravenous, Q2H PRN, Ivor Costa, MD, 5 mg at 10/27/15 0158 .  insulin aspart (novoLOG) injection 0-15 Units, 0-15 Units, Subcutaneous, 6 times per day, Chesley Mires, MD, 2 Units at 10/29/15 1244 .  lacosamide (VIMPAT) 100 mg in sodium chloride 0.9 % 25 mL IVPB, 100 mg, Intravenous, Q12H, Dula Havlik Fuller Mandril, MD, 100 mg at 10/29/15 1100 .  levETIRAcetam (KEPPRA) 1,500 mg in sodium chloride 0.9 % 100 mL IVPB, 1,500 mg, Intravenous, Q12H, [place] Fuller Mandril, MD, 1,500 mg at 10/29/15 1015 .  LORazepam (ATIVAN) injection 1-2 mg, 1-2 mg, Intravenous, Q4H PRN, Kara Mead V, MD .  losartan (COZAAR) tablet 100 mg, 100 mg, Oral, Daily, Oswald Hillock, MD, 100 mg at 10/28/15 1500 .  nystatin-triamcinolone (MYCOLOG II) cream 1 application, 1 application, Topical, BID, Lauren D Bajbus, RPH, 1 application at [id number] .  [DISCONTINUED] ondansetron (ZOFRAN) tablet 4 mg, 4 mg, Oral, Q6H PRN **OR** ondansetron (ZOFRAN) injection 4 mg, 4 mg, Intravenous, Q6H PRN, Ivor Costa, MD .  pantoprazole (PROTONIX) injection 40 mg, 40 mg, Intravenous, Q24H, Tanda Rockers, MD, 40 mg at 10/28/15 2210 .  PHENObarbital (LUMINAL) injection 32.5 mg, 32.5 mg, Intravenous, Daily, Raylene Miyamoto, MD, 32.5 mg at 10/29/15 1004 .  phenytoin (DILANTIN) injection 50 mg, 50 mg, Intravenous, Q12H, Louvenia [place] Fuller Mandril, MD, 50 mg at 10/29/15 1004 .  polyethylene glycol (MIRALAX / GLYCOLAX) packet 17 g, 17 g, Oral, Daily, Raylene Miyamoto, MD, 17 g at 10/25/15 0944 .   propranolol (INDERAL) tablet 40 mg, 40 mg, Oral, TID, Oswald Hillock, MD, 40 mg at 10/28/15 2212 .  sennosides (SENOKOT) 8.8 MG/5ML syrup 5 mL, 5 mL, Per Tube, BID PRN, Chesley Mires, MD, 5 mL at 10/25/15 0024 .  simvastatin (ZOCOR) tablet 40 mg, 40 mg, Per Tube, q morning - 10a, Chesley Mires, MD, 40 mg at 10/25/15 0930 .  sodium chloride flush (NS) 0.9 % injection 10-40 mL, 10-40 mL, Intracatheter, Q12H, Raylene Miyamoto, MD, 20 mL at 10/28/15 2215 .  sodium chloride flush (NS) 0.9 % injection 10-40 mL, 10-40 mL, Intracatheter, PRN, Raylene Miyamoto, MD, 10 mL at 10/29/15 0327 .  sodium chloride flush (NS) 0.9 % injection 3 mL, 3 mL, Intravenous, Q12H, Ivor Costa, MD, 10 mL at 10/28/15 2041 .  topiramate (TOPAMAX) tablet 100 mg, 100 mg, Per Tube, BID, Raylene Miyamoto, MD, 100 mg at 10/28/15 2210 .  vitamin B-12 (CYANOCOBALAMIN) tablet 1,000 mcg, 1,000 mcg, Per Tube, Daily, Chesley Mires, MD, 1,000 mcg at 10/25/15 0930 .  vitamin C (ASCORBIC ACID) tablet 500 mg, 500 mg, Per Tube, Daily, Chesley Mires, MD, 500 mg at 10/25/15 0931 .  vitamin e oil OIL 200 Units, 200 Units, Per Tube, Daily, Chesley Mires, MD, 200 Units at 10/25/15 0940   Neurologic Examination:                                                                                                      Blood pressure 142/72, pulse 99, temperature 99.1 F (37.3 C), temperature source Oral, resp. rate 18, height 5\' 3"  (1.6 m), weight 52.753 kg (116 lb 4.8 oz), SpO2 98 %.  Patient very drowsy, able to open her eyes and said hello with stimulation. With repeated commands, attempted to follow simple one-step commands and elevate her upper extremities antigravity. No abnormal involuntary movements or gaze deviation noted.    Lab Results: Basic Metabolic Panel:  Recent Labs Lab 10/23/15 0521 10/25/15 0550 10/26/15 0509 10/27/15 0412 10/29/15 0354  NA 141 140 140 141 141  K 3.3* 3.1* 3.4* 3.3* 2.9*  CL 107 107 107 104 105  CO2 25 24 25 22 25    GLUCOSE 123* 127* 89 97 102*  BUN 26* 25* 25* 18 21*  CREATININE 0.55 0.44 0.47 0.46 0.54  CALCIUM 9.2 9.3 10.2 10.2 9.5  MG 1.6*  --  1.9 1.7  --   PHOS 2.7  --  3.5 2.7  --     Liver Function Tests:  Recent Labs Lab 10/25/15 0550  AST 106*  ALT 136*  ALKPHOS 69  BILITOT <0.1*  PROT 6.2*  ALBUMIN 2.5*   No results for input(s): LIPASE, AMYLASE in the last  168 hours. No results for input(s): AMMONIA in the last 168 hours.  CBC:  Recent Labs Lab 10/23/15 0521 10/24/15 1145 10/29/15 0354  WBC 5.8 6.7 3.6*  NEUTROABS  --  5.1  --   HGB 8.2* 9.6* 10.5*  HCT 25.7* 29.0* 32.1*  MCV 96.3 95.1 93.3  PLT 257 279 266    Cardiac Enzymes: No results for input(s): CKTOTAL, CKMB, CKMBINDEX, TROPONINI in the last 168 hours.  Lipid Panel: No results for input(s): CHOL, TRIG, HDL, CHOLHDL, VLDL, LDLCALC in the last 168 hours.  CBG:  Recent Labs Lab 10/29/15 0342 10/29/15 0813 10/29/15 1150 10/29/15 1555 10/29/15 1955  GLUCAP 97 119* 121* 120* 56    Microbiology: Results for orders placed or performed during the hospital encounter of 10/08/15  Urine culture     Status: None   Collection Time: 10/09/15  1:43 AM  Result Value Ref Range Status   Specimen Description URINE, CATHETERIZED  Final   Special Requests NONE  Final   Culture   Final    >=100,000 COLONIES/mL DIPHTHEROIDS(CORYNEBACTERIUM SPECIES) Standardized susceptibility testing for this organism is not available.    Report Status 10/10/2015 FINAL  Final  Culture, blood (Routine X 2) w Reflex to ID Panel     Status: None   Collection Time: 10/09/15  5:30 AM  Result Value Ref Range Status   Specimen Description BLOOD LEFT HAND  Final   Special Requests IN PEDIATRIC BOTTLE 1CC  Final   Culture NO GROWTH 5 DAYS  Final   Report Status 10/14/2015 FINAL  Final  Culture, blood (Routine X 2) w Reflex to ID Panel     Status: None   Collection Time: 10/09/15  5:38 AM  Result Value Ref Range Status   Specimen  Description BLOOD LEFT ARM  Final   Special Requests IN PEDIATRIC BOTTLE [place]  Final   Culture NO GROWTH 5 DAYS  Final   Report Status 10/14/2015 FINAL  Final  MRSA PCR Screening     Status: None   Collection Time: 10/10/15 11:39 PM  Result Value Ref Range Status   MRSA by PCR NEGATIVE NEGATIVE Final    Comment:        The GeneXpert MRSA Assay (FDA approved for NASAL specimens only), is one component of a comprehensive MRSA colonization surveillance program. It is not intended to diagnose MRSA infection nor to guide or monitor treatment for MRSA infections.   CSF culture     Status: None   Collection Time: 10/11/15  1:33 PM  Result Value Ref Range Status   Specimen Description CSF  Final   Special Requests CSF NO 2  Final   Gram Stain   Final    CYTOSPIN SMEAR WBC PRESENT, PREDOMINANTLY PMN NO ORGANISMS SEEN    Culture NO GROWTH 3 DAYS  Final   Report Status 10/14/2015 FINAL  Final  Culture, respiratory (NON-Expectorated)     Status: None   Collection Time: 10/13/15  9:30 AM  Result Value Ref Range Status   Specimen Description ENDOTRACHEAL  Final   Special Requests NONE  Final   Gram Stain   Final    FEW WBC PRESENT,BOTH PMN AND MONONUCLEAR RARE SQUAMOUS EPITHELIAL CELLS PRESENT NO ORGANISMS SEEN Performed at [facility]    Culture   Final    FEW YEAST CONSISTENT WITH CANDIDA SPECIES Performed at [facility]    Report Status 10/16/2015 FINAL  Final    Imaging: Dg Abd Portable 1v  10/29/2015  CLINICAL DATA:  Feeding tube placement. EXAM: PORTABLE ABDOMEN - 1 VIEW COMPARISON:  06/16/2014. FINDINGS: The feeding tube tip is in the stomach. It is curved back on itself with the tip in the fundal region. The bowel gas pattern is unremarkable. The lung bases are clear. IMPRESSION: Feeding tube tip coursing down into the stomach. It is curving back on itself with the tip probably in the fundal region. Electronically Signed   By: Marijo Sanes M.D.   On:  10/29/2015 16:48    Assessment and plan:   Kathryn [name] is an 82 y.o. female patient with resolved status epilepticus as described in the prior neurology notes. She appears to be gradually recovering from encephalopathy, and is very drowsy. Recommend discontinuing phenobarbital. We will reduce Vimpat dose to 50 mg twice a day and Keppra dose to 1000 mg twice a day. Continue Topamax and Dilantin doses unchanged.  Continue supportive medical care.  We'll follow-up.

## 2015-10-29 NOTE — Progress Notes (Signed)
Pt son came to visit pt, RN gave son updates about pt care.

## 2015-10-30 ENCOUNTER — Inpatient Hospital Stay (HOSPITAL_COMMUNITY): Payer: Medicare Other

## 2015-10-30 DIAGNOSIS — R531 Weakness: Secondary | ICD-10-CM | POA: Insufficient documentation

## 2015-10-30 DIAGNOSIS — R569 Unspecified convulsions: Secondary | ICD-10-CM | POA: Insufficient documentation

## 2015-10-30 LAB — BASIC METABOLIC PANEL
ANION GAP: 9 (ref 5–15)
BUN: 19 mg/dL (ref 6–20)
CO2: 25 mmol/L (ref 22–32)
Calcium: 8.9 mg/dL (ref 8.9–10.3)
Chloride: 106 mmol/L (ref 101–111)
Creatinine, Ser: 0.55 mg/dL (ref 0.44–1.00)
GFR calc Af Amer: >60 mL/min (ref >60)
GLUCOSE: 305 mg/dL — AB (ref 65–99)
POTASSIUM: 3.1 mmol/L — AB (ref 3.5–5.1)
Sodium: 140 mmol/L (ref 135–145)

## 2015-10-30 LAB — GLUCOSE, CAPILLARY
GLUCOSE-CAPILLARY: 106 mg/dL — AB (ref 65–99)
GLUCOSE-CAPILLARY: 109 mg/dL — AB (ref 65–99)
GLUCOSE-CAPILLARY: 112 mg/dL — AB (ref 65–99)
GLUCOSE-CAPILLARY: 119 mg/dL — AB (ref 65–99)
GLUCOSE-CAPILLARY: 122 mg/dL — AB (ref 65–99)
GLUCOSE-CAPILLARY: 130 mg/dL — AB (ref 65–99)
Glucose-Capillary: 109 mg/dL — ABNORMAL HIGH (ref 65–99)

## 2015-10-30 MED ORDER — PANTOPRAZOLE SODIUM 40 MG PO PACK
40.0000 mg | PACK | Freq: Every day | ORAL | Status: DC
  Administered 2015-10-30: 40 mg
  Filled 2015-10-30 (×2): qty 20

## 2015-10-30 MED ORDER — TOPIRAMATE 25 MG PO TABS
50.0000 mg | ORAL_TABLET | Freq: Two times a day (BID) | ORAL | Status: DC
  Administered 2015-10-30 – 2015-10-31 (×2): 50 mg
  Filled 2015-10-30 (×2): qty 2

## 2015-10-30 MED ORDER — JEVITY 1.2 CAL PO LIQD
1000.0000 mL | ORAL | Status: DC
  Administered 2015-10-30: 1000 mL
  Filled 2015-10-30 (×4): qty 1000
  Filled 2015-10-30: qty 237

## 2015-10-30 MED ORDER — ANTISEPTIC ORAL RINSE SOLUTION (CORINZ)
7.0000 mL | Freq: Four times a day (QID) | OROMUCOSAL | Status: DC
  Administered 2015-10-30 – 2015-11-03 (×16): 7 mL via OROMUCOSAL

## 2015-10-30 MED ORDER — POTASSIUM CHLORIDE 10 MEQ/100ML IV SOLN
10.0000 meq | INTRAVENOUS | Status: AC
  Administered 2015-10-30 (×3): 10 meq via INTRAVENOUS
  Filled 2015-10-30 (×3): qty 100

## 2015-10-30 NOTE — Progress Notes (Signed)
Initiated NG tube feeding Jevity 1.2 at 61ml/hr per dietician order. NG placement confirmed in IR. AM meds administered per tube at Dr. Darrick Meigs verbal order. Will continue to monitor. Wendee Copp

## 2015-10-30 NOTE — Progress Notes (Signed)
Interval History:                                                                                                                      Kathryn [name] is an 82 y.o. female patient , resolved status epilepticus. She is more alert today compared to yesterday after some of the seizure medication doses have been reduced yesterday. No clinical events suspicious for seizures.    Past Medical History: Past Medical History  Diagnosis Date  . Depression   . Hypertension   . High cholesterol   . Rectal prolapse   . Hx of adenomatous colonic polyps   . Internal hemorrhoids   . Hypothyroidism   . Fecal incontinence   . TIA (transient ischemic attack)   . History of frequent urinary tract infections     recent  . Migraine   . Fatty liver 03/20/13  . Generalized ischemic cerebrovascular disease 10/09/2015  . Stroke [facility]) Sept 1, 2010; Sept 12, 2011    Past Surgical History  Procedure Laterality Date  . Colon surgery  2010, for prolapsed organs after hystecrtomy surgery  . Appendectomy  2008  . Cholecystectomy    . Lumbar laminectomy/decompression microdiscectomy N/A 01/07/2013    Procedure: LUMBAR LAMINECTOMY CENTRAL DECOMPRESSION L4-L5, BILATERAL FORAMENOTOMY L4,L5    ;  Surgeon: Tobi Bastos, MD;  Location: [facility];  Service: Orthopedics;  Laterality: N/A;  . Back surgery    . Hemorrhoid surgery  09/2008    Archie Endo 12/31/2010  . Abdominal hysterectomy  05/2007    Archie Endo 01/02/2011    Family History: Family History  Problem Relation Age of Onset  . Stroke Father   . Migraines Father   . CVA Father   . Heart attack Father   . Hypertension Maternal Aunt     x3  . Colon cancer Neg Hx   . CVA Mother     Social History:   reports that she has never smoked. She has never used smokeless tobacco. She reports that she does not drink alcohol or use illicit drugs.  Allergies:  Allergies  Allergen Reactions  . Augmentin [Amoxicillin-Pot Clavulanate] Swelling and Diarrhea    Throat   .  Citalopram Swelling    Eyes ears and throat swelling Throat and eyes   . Codeine Anaphylaxis and Shortness Of Breath  . Fish-Derived Products Anaphylaxis  . Hyoscyamine Anaphylaxis and Swelling  . Ibuprofen Swelling    Throat and eyes   . Latex Swelling and Rash    Swelling to troat  . Lipitor [Atorvastatin] Swelling    Muscle aches throat  . Septra [Bactrim] Anaphylaxis  . Shellfish Allergy Anaphylaxis  . Miconazole Rash  . Keflet [Cephalexin] Diarrhea    Upset stomach  . Meloxicam Diarrhea and Nausea And Vomiting  . Sulfa Antibiotics     Rash and also throat was tight  . Verapamil     Tachycardia and flushing  . Vicodin [Hydrocodone-Acetaminophen] Other (See Comments)    stroke  .  Zoloft [Sertraline Hcl] Swelling    Red spots all over face, swelling of tongue and legs.   . Ciprofloxacin Nausea And Vomiting    Other reaction(s): Abdominal Pain  . Depakene [Valproate Sodium] Hives    All over body   . Isoptin Sr [Verapamil Hcl Er] Cough  . Lisinopril Cough    Fatigue and cough  . Telmisartan-Hctz Cough     Medications:                                                                                                                         Current facility-administered medications:  .  Place/Maintain arterial line, , , Until Discontinued **AND** 0.9 %  sodium chloride infusion, , Intra-arterial, PRN, Javier Glazier, MD, Last Rate: 10 mL/hr at 10/23/15 2000 .  acetaminophen (TYLENOL) solution 650 mg, 650 mg, Oral, Q6H PRN, Chesley Mires, MD, 650 mg at 10/28/15 2355 .  acetaminophen (TYLENOL) suppository 650 mg, 650 mg, Rectal, Q6H PRN, Gardiner Barefoot, NP, 650 mg at 10/30/15 [id number] .  amLODipine (NORVASC) tablet 5 mg, 5 mg, Oral, Daily, Oswald Hillock, MD, 5 mg at 10/30/15 1542 .  antiseptic oral rinse solution (CORINZ), 7 mL, Mouth Rinse, QID, Oswald Hillock, MD, 7 mL at 10/30/15 1750 .  chlorhexidine gluconate (PERIDEX) 0.12 % solution 15 mL, 15 mL, Mouth Rinse, BID,  Chesley Mires, MD, 15 mL at 10/30/15 1957 .  dextrose 5 %-0.45 % sodium chloride infusion, , Intravenous, Continuous, Oswald Hillock, MD, Last Rate: 50 mL/hr at 10/30/15 0023 .  enoxaparin (LOVENOX) injection 40 mg, 40 mg, Subcutaneous, Q24H, Raelyn Ensign Sumner, DO, 40 mg at 10/30/15 1037 .  feeding supplement (JEVITY 1.2 CAL) liquid 1,000 mL, 1,000 mL, Per Tube, Continuous, Oswald Hillock, MD, Last Rate: 50 mL/hr at 10/30/15 1542, 1,000 mL at 10/30/15 1542 .  fentaNYL (SUBLIMAZE) injection 50 mcg, 50 mcg, Intravenous, Q2H PRN, Chesley Mires, MD, 50 mcg at 10/21/15 0133 .  hydrALAZINE (APRESOLINE) injection 5 mg, 5 mg, Intravenous, Q2H PRN, Ivor Costa, MD, 5 mg at 10/27/15 0158 .  insulin aspart (novoLOG) injection 0-15 Units, 0-15 Units, Subcutaneous, 6 times per day, Chesley Mires, MD, 2 Units at 10/29/15 1244 .  lacosamide (VIMPAT) 50 mg in sodium chloride 0.9 % 25 mL IVPB, 50 mg, Intravenous, Q12H, Arrabella Westerman Fuller Mandril, MD, 50 mg at 10/30/15 1300 .  levETIRAcetam (KEPPRA) 1,000 mg in sodium chloride 0.9 % 100 mL IVPB, 1,000 mg, Intravenous, Q12H, Shizue Kaseman Fuller Mandril, MD, 1,000 mg at 10/30/15 1236 .  LORazepam (ATIVAN) injection 1-2 mg, 1-2 mg, Intravenous, Q4H PRN, Kara Mead V, MD .  losartan (COZAAR) tablet 100 mg, 100 mg, Oral, Daily, Oswald Hillock, MD, 100 mg at 10/30/15 1541 .  nystatin-triamcinolone (MYCOLOG II) cream 1 application, 1 application, Topical, BID, Lauren D Bajbus, RPH, 1 application at [id number] .  [DISCONTINUED] ondansetron (ZOFRAN) tablet 4 mg, 4 mg, Oral, Q6H PRN **OR** ondansetron (ZOFRAN) injection 4 mg,  4 mg, Intravenous, Q6H PRN, Ivor Costa, MD .  pantoprazole sodium (PROTONIX) 40 mg/20 mL oral suspension 40 mg, 40 mg, Per Tube, QHS, Crystal S Robertson, RPH .  phenytoin (DILANTIN) injection 50 mg, 50 mg, Intravenous, Q12H, Orvill Coulthard Fuller Mandril, MD, 50 mg at 10/30/15 1049 .  polyethylene glycol (MIRALAX / GLYCOLAX) packet 17 g, 17 g, Oral, Daily, Raylene Miyamoto, MD, 17 g at 10/25/15 0944 .  propranolol (INDERAL) tablet 40 mg, 40 mg, Oral, TID, Oswald Hillock, MD, 40 mg at 10/30/15 1550 .  sennosides (SENOKOT) 8.8 MG/5ML syrup 5 mL, 5 mL, Per Tube, BID PRN, Chesley Mires, MD, 5 mL at 10/25/15 0024 .  simvastatin (ZOCOR) tablet 40 mg, 40 mg, Per Tube, q morning - 10a, Chesley Mires, MD, 40 mg at 10/30/15 1541 .  sodium chloride flush (NS) 0.9 % injection 10-40 mL, 10-40 mL, Intracatheter, Q12H, Raylene Miyamoto, MD, 20 mL at 10/28/15 2215 .  sodium chloride flush (NS) 0.9 % injection 10-40 mL, 10-40 mL, Intracatheter, PRN, Raylene Miyamoto, MD, 10 mL at 10/30/15 1743 .  sodium chloride flush (NS) 0.9 % injection 3 mL, 3 mL, Intravenous, Q12H, Ivor Costa, MD, 3 mL at 10/29/15 2221 .  topiramate (TOPAMAX) tablet 100 mg, 100 mg, Per Tube, BID, Raylene Miyamoto, MD, 100 mg at 10/28/15 2210 .  vitamin B-12 (CYANOCOBALAMIN) tablet 1,000 mcg, 1,000 mcg, Per Tube, Daily, Chesley Mires, MD, 1,000 mcg at 10/30/15 1544 .  vitamin C (ASCORBIC ACID) tablet 500 mg, 500 mg, Per Tube, Daily, Chesley Mires, MD, 500 mg at 10/30/15 1542 .  vitamin e oil OIL 200 Units, 200 Units, Per Tube, Daily, Chesley Mires, MD, 200 Units at 10/30/15 1539   Neurologic Examination:                                                                                                      Blood pressure 136/60, pulse 77, temperature 98.2 F (36.8 C), temperature source Axillary, resp. rate 18, height 5\' 3"  (1.6 m), weight 53.933 kg (118 lb 14.4 oz), SpO2 97 %.  Patient is more alert today, able to follow simple commands easily, attempted to elevate her upper extremities although has generalized weakness, likely from combination of possible critical illness neuropathy due to extended ICU stay and recovering encephalopathy. Increased tone in bilateral upper extremity and intermittent resting tremor is also seen.    Lab Results: Basic Metabolic Panel:  Recent Labs Lab 10/25/15 0550  10/26/15 0509 10/27/15 0412 10/29/15 0354 10/30/15 0353  NA 140 140 141 141 140  K 3.1* 3.4* 3.3* 2.9* 3.1*  CL 107 107 104 105 106  CO2 24 25 22 25 25   GLUCOSE 127* 89 97 102* 305*  BUN 25* 25* 18 21* 19  CREATININE 0.44 0.47 0.46 0.54 0.55  CALCIUM 9.3 10.2 10.2 9.5 8.9  MG  --  1.9 1.7  --   --   PHOS  --  3.5 2.7  --   --     Liver Function Tests:  Recent Labs Lab 10/25/15 0550  AST 106*  ALT 136*  ALKPHOS 69  BILITOT <0.1*  PROT 6.2*  ALBUMIN 2.5*   No results for input(s): LIPASE, AMYLASE in the last 168 hours. No results for input(s): AMMONIA in the last 168 hours.  CBC:  Recent Labs Lab 10/24/15 1145 10/29/15 0354  WBC 6.7 3.6*  NEUTROABS 5.1  --   HGB 9.6* 10.5*  HCT 29.0* 32.1*  MCV 95.1 93.3  PLT 279 266    Cardiac Enzymes: No results for input(s): CKTOTAL, CKMB, CKMBINDEX, TROPONINI in the last 168 hours.  Lipid Panel: No results for input(s): CHOL, TRIG, HDL, CHOLHDL, VLDL, LDLCALC in the last 168 hours.  CBG:  Recent Labs Lab 10/30/15 0432 10/30/15 0809 10/30/15 1135 10/30/15 1614 10/30/15 1950  GLUCAP 119* 122* 109* 106* 112*    Microbiology: Results for orders placed or performed during the hospital encounter of 10/08/15  Urine culture     Status: None   Collection Time: 10/09/15  1:43 AM  Result Value Ref Range Status   Specimen Description URINE, CATHETERIZED  Final   Special Requests NONE  Final   Culture   Final    >=100,000 COLONIES/mL DIPHTHEROIDS(CORYNEBACTERIUM SPECIES) Standardized susceptibility testing for this organism is not available.    Report Status 10/10/2015 FINAL  Final  Culture, blood (Routine X 2) w Reflex to ID Panel     Status: None   Collection Time: 10/09/15  5:30 AM  Result Value Ref Range Status   Specimen Description BLOOD LEFT HAND  Final   Special Requests IN PEDIATRIC BOTTLE 1CC  Final   Culture NO GROWTH 5 DAYS  Final   Report Status 10/14/2015 FINAL  Final  Culture, blood (Routine X 2)  w Reflex to ID Panel     Status: None   Collection Time: 10/09/15  5:38 AM  Result Value Ref Range Status   Specimen Description BLOOD LEFT ARM  Final   Special Requests IN PEDIATRIC BOTTLE [place]  Final   Culture NO GROWTH 5 DAYS  Final   Report Status 10/14/2015 FINAL  Final  MRSA PCR Screening     Status: None   Collection Time: 10/10/15 11:39 PM  Result Value Ref Range Status   MRSA by PCR NEGATIVE NEGATIVE Final    Comment:        The GeneXpert MRSA Assay (FDA approved for NASAL specimens only), is one component of a comprehensive MRSA colonization surveillance program. It is not intended to diagnose MRSA infection nor to guide or monitor treatment for MRSA infections.   CSF culture     Status: None   Collection Time: 10/11/15  1:33 PM  Result Value Ref Range Status   Specimen Description CSF  Final   Special Requests CSF NO 2  Final   Gram Stain   Final    CYTOSPIN SMEAR WBC PRESENT, PREDOMINANTLY PMN NO ORGANISMS SEEN    Culture NO GROWTH 3 DAYS  Final   Report Status 10/14/2015 FINAL  Final  Culture, respiratory (NON-Expectorated)     Status: None   Collection Time: 10/13/15  9:30 AM  Result Value Ref Range Status   Specimen Description ENDOTRACHEAL  Final   Special Requests NONE  Final   Gram Stain   Final    FEW WBC PRESENT,BOTH PMN AND MONONUCLEAR RARE SQUAMOUS EPITHELIAL CELLS PRESENT NO ORGANISMS SEEN Performed at [facility]    Culture   Final    FEW YEAST CONSISTENT WITH CANDIDA SPECIES Performed at [facility]  Report Status 10/16/2015 FINAL  Final    Imaging: Dg Abd 1 View  10/30/2015  CLINICAL DATA:  Feeding tube placement EXAM: ABDOMEN - 1 VIEW COMPARISON:  Study obtained earlier in the day FLUOROSCOPY TIME:  0 minutes 1 second; 1 acquired image FINDINGS: Feeding tube tip is at the junction of the duodenum and jejunum. Contrast flows freely through the tube into the proximal jejunum. The visualized bowel gas pattern is normal.  IMPRESSION: Feeding tube tip is just beyond the fourth portion the duodenum in the proximal most aspect of the jejunum. Bowel gas pattern unremarkable. Electronically Signed   By: Lowella Grip III M.D.   On: 10/30/2015 14:21   Dg Abd Portable 1v  10/30/2015  CLINICAL DATA:  Feeding tube placement EXAM: PORTABLE ABDOMEN - 1 VIEW COMPARISON:  10/29/2015 FINDINGS: There is a feeding tube with the tube coiled within the stomach and the metallic tip located along the fundus of the stomach. There is no bowel dilatation to suggest obstruction. There is no evidence of pneumoperitoneum, portal venous gas or pneumatosis. There are no pathologic calcifications along the expected course of the ureters. The osseous structures are unremarkable. IMPRESSION: Feeding tube with the tube coiled within the stomach and the metallic tip located along the fundus of the stomach. Electronically Signed   By: Kathreen Devoid   On: 10/30/2015 12:40   Dg Abd Portable 1v  10/29/2015  CLINICAL DATA:  Feeding tube placement. EXAM: PORTABLE ABDOMEN - 1 VIEW COMPARISON:  06/16/2014. FINDINGS: The feeding tube tip is in the stomach. It is curved back on itself with the tip in the fundal region. The bowel gas pattern is unremarkable. The lung bases are clear. IMPRESSION: Feeding tube tip coursing down into the stomach. It is curving back on itself with the tip probably in the fundal region. Electronically Signed   By: Marijo Sanes M.D.   On: 10/29/2015 16:48   Dg Addison Bailey G Tube Plc W/fl-no Rad  10/30/2015  CLINICAL DATA:  NASO G TUBE PLACEMENT WITH FLUORO Fluoroscopy was utilized by the requesting physician.  No radiographic interpretation.    Assessment and plan:   Kathryn [name] is an 82 y.o. female patientwith resolved status epilepticus, recovering from encephalopathy. She is more alert today after some of seizure medication does exhibit reduced yesterday. Recommend discontinuing Vimpat and reduce Topamax dose to 50 mg twice a day,  continue Keppra 1 g twice a day and phenytoin 50 mg twice a day. Recommend physical and occupational therapy to increase mobility. Discussed her current assessment and treatment plan with her son and daughter-in-law at bedside. Her daughter-in-law works as a Marine scientist at [employer].  [place] follow-up.

## 2015-10-30 NOTE — Progress Notes (Signed)
SLP Cancellation Note  Patient Details Name: Kathryn [name] MRN: [id number] DOB: 1931-02-14   Cancelled treatment:       Reason Eval/Treat Not Completed: Patient at procedure or test/unavailable earlier today and SLP was unable to return later on day.  Will continue efforts.   Gunnar Fusi, M.A., CCC-SLP [phone]  [place] 10/30/2015, 5:10 PM

## 2015-10-30 NOTE — Clinical Social Work Note (Addendum)
CSW continuing to follow patient's progress, per MD patient not medically ready for discharge yet.  Updated clinicals sent to SNF for short term rehab.  Goldin Broom. [place], MSW, [place] 10/30/2015 6:07 PM

## 2015-10-30 NOTE — Progress Notes (Addendum)
Nutrition Follow-up  INTERVENTION:  Initiate Jevity 1.2 @ 20 ml/hr via Cortrak NGT and increase by 10 ml every 4 hours to goal rate of 50 ml/hr.   Tube feeding regimen provides 1440 kcal (100% of needs), 67 grams of protein, and 972 ml of H2O.  When IV fluids are discontinued, recommend adding 130 ml free water flushes every 6 hours to provide an additional 520 ml of water daily.   Consider monitoring magnesium, potassium and phosphorus for 3 days given NPO status for >/=  5 days.   NUTRITION DIAGNOSIS:   Inadequate oral intake related to inability to eat as evidenced by NPO status.  Ongoing  GOAL:   Patient will meet greater than or equal to 90% of their needs  Unmet  MONITOR:   Diet advancement, I & O's  REASON FOR ASSESSMENT:   Consult Enteral/tube feeding initiation and management  ASSESSMENT:   [name] with PMH of HTN, hyperlipidemia, GERD, hypothyroidism, depression, anxiety, TIA, stroke, recurrent UTI, chronic back pain, chronic abdominal pain, who presented with altered mental status and hallucination.  Pt remains NPO. Cortrak NGT placed today and per IR is post-pyloric at the junction of the duodenum and jejunum. RD consulted for TF management. Pt has been NPO and without TF's since extubation on 2/23.   Labs: low potassium, low hemoglobin  Diet Order:  Diet NPO time specified  Skin:  Reviewed, no issues  Last BM:  2/24  Height:   Ht Readings from Last 1 Encounters:  10/21/15 5\' 3"  (1.6 m)    Weight:   Wt Readings from Last 1 Encounters:  10/30/15 118 lb 14.4 oz (53.933 kg)    Ideal Body Weight:  52 kg  BMI:  Body mass index is 21.07 kg/(m^2).  Estimated Nutritional Needs:   Kcal:  1300-1500  Protein:  65-75 grams  Fluid:  >/= 1.5 L  EDUCATION NEEDS:   No education needs identified at this time  Amanda Park, LDN Inpatient Clinical Dietitian Pager: [phone] After Hours Pager: [phone]

## 2015-10-30 NOTE — Progress Notes (Signed)
[facility] PROGRESS NOTE  Kathryn [name] [id number] DOB: 1931-02-02 DOA: 10/08/2015 PCP: Lilian Coma, MD  Assessment/Plan:  Status epilepticus- resolved,   patient was admitted to the ICU on 10/09/2015 with prolonged status epilepticus, was intubated and sedated with Diprivan,  was followed by neurology underwent continuous EEG monitoring which has been discontinued. Patient's seizures are controlled with multiple anticonvulsant regimen including Vimpat, Keppra, phenobarbital, Dilantin, Topamax.   Metabolic encephalopathy - likely from multiple antiepileptic medications, Neurology has adjusted antiepileptic medications and discontinued phenobarbital. Vimpat and Keppra dose has been reduced.  Nutrition- patient is currently nothing by mouth due to aspiration risk. Has been followed by speech therapy and still has risk for aspiration. I called and discussed with patient's son to consider feeding tube cor track. He agrees to put the feeding tube. We'll order cortak was ordered, and cortak team tried twice to place the tube and were unsuccessful. Will consult IR for panda placement. In the meantime continue  D5 half-normal saline at 50 mL per hour.   Hypokalemia- potassium this morning 3.1 despite replacement with IV KCl 10 mEq 3. Will check serum magnesium level and again replace potassium.  Acute respiratory failure in setting of status epilepticus- resolved  Hypertension- blood pressure is elevated, will restart her medications if she is alert enough to swallow. Meds to be crushed with purely as per speech therapy recommendations. Will restart amlodipine, Cozaar, propranolol. Continue hydralazine 5 mg IV every 2 hours when necessary for SBP more than 175.  DVT prophylaxis- Lovenox  Goals of care - patient has been lethargic without by mouth due to somnolence from multiple antiepileptic medications . Once the medications are adjusted , and patient started on tube feedings . Monitor  the patient, if improved and eating well she can be discharged to skilled facility. If no improvement despite feeding and adjusted medications, consider palliative care consultation for goals of care. Called and discussed with patient's son on phone and he agrees with this plan.    Code Status: DO NOT RESUSCITATE Family Communication: **Discussed with patient's son on phone Disposition Plan: Skilled nursing facility   Consultants:  Neurology  Pulmonary  Procedures: CULTURES: 2/07 Urine >> diphtheroids 2/07 Blood >>NGTD 2/09 CSF >>NGTD  ANTIBIOTICS: 2/06 Levaquin >>off 2/09 Acyclovir >> 2/11 2/10 Vanco>>off 2/10 ceftaz>> off 2/11 sputum>> candida  Tubes 2/10 ETT >> 2/23  STUDIES and events:  2/06 Admit 2/08 Noted to have seizure, neuro consulted 2/06 CT head >> atrophy 2/07 MRI brain >> remote basal ganglia and corona radiata infarcts b/l, remote Rt thalamus and posterior limb internal capsule infarcts, global atrophy 2/08 EEG >> lt temporal region focal seizures, lt hemisphere slowing 2/09 LP >> 140 RBC, 2 WBC, protein 38, glucose 51 2/10 Status epilepticus >> to ICU, diprivan coma  2./12 - Sedated on vent, szs overnight  2/13 - afebrile with ongoing c-eeg and diprivan 10/16/15 - c-EEG off. PEr tech - PLEDS +. c-versed gtt started by neuro per RN.  MRI - New signal abnormality in the left thalamus  2/15 EEG - PLEDS 2/17 - EEG - PLEDS improved 2/18 PLEDS resolved 2/23- DNR, DNI, no trach option, extubated successfuly   Antibiotics: None  HPI/Subjective: 82 yo female brought to ER due to confusion after falling. She was also having visual hallucinations. She was noted to have seizure on 2/08 and neurology consulted. She was also noted to have fever. She had LP that was unrevealing. She was started on abx for possible UTI, and acyclovir for possible  encephalitis. Her seizures persistent, and she was transferred to ICU for diprivan coma and intubation. A  shunt was sedated on Diprivan and intubated. EEG performed on 10/17/2015 showed periodic lateralized epileptiform discharges (PLED's). Which resolved on 10/20/2015. Patient was extubated after discussion with family and was made DO NOT RESUSCITATE. She had failed swallow evaluation so was kept nothing by mouth.  Patient continues to be lethargic, noncommunicative. Responds minimally to tactile stimuli  Objective: Filed Vitals:   10/30/15 0912 10/30/15 1335  BP: 132/63 150/57  Pulse: 82 84  Temp: 98.2 F (36.8 C) 98.4 F (36.9 C)  Resp: 20 20    Intake/Output Summary (Last 24 hours) at 10/30/15 1412 Last data filed at 10/30/15 [id number]  Gross per 24 hour  Intake      0 ml  Output    475 ml  Net   -475 ml   Filed Weights   10/28/15 0500 10/29/15 0500 10/30/15 0433  Weight: 53.615 kg (118 lb 3.2 oz) 52.753 kg (116 lb 4.8 oz) 53.933 kg (118 lb 14.4 oz)    Exam:   General:  Appears lethargic, responds to Tactile stimuli  Cardiovascular: S1S2 RRR  Respiratory: Clear bilaterally  Abdomen: Soft, nontender  Musculoskeletal: No edema of the lower extremities  Data Reviewed: Basic Metabolic Panel:  Recent Labs Lab 10/25/15 0550 10/26/15 0509 10/27/15 0412 10/29/15 0354 10/30/15 0353  NA 140 140 141 141 140  K 3.1* 3.4* 3.3* 2.9* 3.1*  CL 107 107 104 105 106  CO2 24 25 22 25 25   GLUCOSE 127* 89 97 102* 305*  BUN 25* 25* 18 21* 19  CREATININE 0.44 0.47 0.46 0.54 0.55  CALCIUM 9.3 10.2 10.2 9.5 8.9  MG  --  1.9 1.7  --   --   PHOS  --  3.5 2.7  --   --    Liver Function Tests:  Recent Labs Lab 10/25/15 0550  AST 106*  ALT 136*  ALKPHOS 69  BILITOT <0.1*  PROT 6.2*  ALBUMIN 2.5*   CBC:  Recent Labs Lab 10/24/15 1145 10/29/15 0354  WBC 6.7 3.6*  NEUTROABS 5.1  --   HGB 9.6* 10.5*  HCT 29.0* 32.1*  MCV 95.1 93.3  PLT 279 266   Cardiac Enzymes: No results for input(s): CKTOTAL, CKMB, CKMBINDEX, TROPONINI in the last 168 hours. BNP (last 3  results)  Recent Labs  10/09/15 0228  BNP 64.5    ProBNP (last 3 results) No results for input(s): PROBNP in the last 8760 hours.  CBG:  Recent Labs Lab 10/29/15 1955 10/30/15 0023 10/30/15 0432 10/30/15 0809 10/30/15 1135  GLUCAP 97 109* 119* 122* 109*    No results found for this or any previous visit (from the past 240 hour(s)).   Studies: Dg Abd Portable 1v  10/30/2015  CLINICAL DATA:  Feeding tube placement EXAM: PORTABLE ABDOMEN - 1 VIEW COMPARISON:  10/29/2015 FINDINGS: There is a feeding tube with the tube coiled within the stomach and the metallic tip located along the fundus of the stomach. There is no bowel dilatation to suggest obstruction. There is no evidence of pneumoperitoneum, portal venous gas or pneumatosis. There are no pathologic calcifications along the expected course of the ureters. The osseous structures are unremarkable. IMPRESSION: Feeding tube with the tube coiled within the stomach and the metallic tip located along the fundus of the stomach. Electronically Signed   By: Kathreen Devoid   On: 10/30/2015 12:40   Dg Abd Portable 1v  10/29/2015  CLINICAL DATA:  Feeding tube placement. EXAM: PORTABLE ABDOMEN - 1 VIEW COMPARISON:  06/16/2014. FINDINGS: The feeding tube tip is in the stomach. It is curved back on itself with the tip in the fundal region. The bowel gas pattern is unremarkable. The lung bases are clear. IMPRESSION: Feeding tube tip coursing down into the stomach. It is curving back on itself with the tip probably in the fundal region. Electronically Signed   By: Marijo Sanes M.D.   On: 10/29/2015 16:48   Dg Addison Bailey G Tube Plc W/fl-no Rad  10/30/2015  CLINICAL DATA:  NASO G TUBE PLACEMENT WITH FLUORO Fluoroscopy was utilized by the requesting physician.  No radiographic interpretation.    Scheduled Meds: . amLODipine  5 mg Oral Daily  . antiseptic oral rinse  7 mL Mouth Rinse QID  . chlorhexidine gluconate  15 mL Mouth Rinse BID  . enoxaparin  (LOVENOX) injection  40 mg Subcutaneous Q24H  . insulin aspart  0-15 Units Subcutaneous 6 times per day  . lacosamide (VIMPAT) IV  50 mg Intravenous Q12H  . levETIRAcetam  1,000 mg Intravenous Q12H  . losartan  100 mg Oral Daily  . nystatin-triamcinolone  1 application Topical BID  . pantoprazole sodium  40 mg Per Tube QHS  . phenytoin (DILANTIN) IV  50 mg Intravenous Q12H  . polyethylene glycol  17 g Oral Daily  . propranolol  40 mg Oral TID  . simvastatin  40 mg Per Tube q morning - 10a  . sodium chloride flush  10-40 mL Intracatheter Q12H  . sodium chloride flush  3 mL Intravenous Q12H  . topiramate  100 mg Per Tube BID  . vitamin B-12  1,000 mcg Per Tube Daily  . vitamin C  500 mg Per Tube Daily  . vitamin e  200 Units Per Tube Daily   Continuous Infusions: . dextrose 5 % and 0.45% NaCl 50 mL/hr at 10/30/15 0023    Principal Problem:   Acute encephalopathy Active Problems:   Spinal stenosis, lumbar region, with neurogenic claudication   Generalized anxiety disorder   GERD (gastroesophageal reflux disease)   Essential hypertension, benign   Recurrent falls   History of stroke   Polypharmacy   Generalized ischemic cerebrovascular disease   Chronic abdominal pain   Status epilepticus ([place])   Encephalopathy   Endotracheal tube present   Acute respiratory failure with hypoxia ([place])   Acute respiratory failure ([place])   Seizure ([place])    Time spent: 25 min    [facility] Pager [phone]. If 7PM-7AM, please contact night-coverage at [email or web address], password [facility] 10/30/2015, 2:12 PM  LOS: 21 days

## 2015-10-30 NOTE — Progress Notes (Signed)
PT Cancellation Note  Patient Details Name: Kathryn [name] MRN: [id number] DOB: 03-22-1931   Cancelled Treatment:    Reason Eval/Treat Not Completed: Patient's level of consciousness   Noted pt did not respond to upright activity previously. Currently snoring, not awakening. Noted decr in some medicines made today. Will re-attempt 3/1   Garik Diamant 10/30/2015, 12:27 PM  Pager [phone]

## 2015-10-31 ENCOUNTER — Other Ambulatory Visit: Payer: Self-pay | Admitting: Family Medicine

## 2015-10-31 ENCOUNTER — Inpatient Hospital Stay (HOSPITAL_COMMUNITY): Payer: Medicare Other

## 2015-10-31 LAB — GLUCOSE, CAPILLARY
GLUCOSE-CAPILLARY: 145 mg/dL — AB (ref 65–99)
GLUCOSE-CAPILLARY: 126 mg/dL — AB (ref 65–99)
Glucose-Capillary: 130 mg/dL — ABNORMAL HIGH (ref 65–99)
Glucose-Capillary: 167 mg/dL — ABNORMAL HIGH (ref 65–99)
Glucose-Capillary: 144 mg/dL — ABNORMAL HIGH (ref 65–99)

## 2015-10-31 LAB — POTASSIUM: Potassium: 3.1 mmol/L — ABNORMAL LOW (ref 3.5–5.1)

## 2015-10-31 LAB — MAGNESIUM: Magnesium: 1.3 mg/dL — ABNORMAL LOW (ref 1.7–2.4)

## 2015-10-31 MED ORDER — LEVETIRACETAM 750 MG PO TABS
750.0000 mg | ORAL_TABLET | Freq: Two times a day (BID) | ORAL | Status: DC
  Filled 2015-10-31: qty 1

## 2015-10-31 MED ORDER — RESOURCE THICKENUP CLEAR PO POWD
Freq: Once | ORAL | Status: DC
  Filled 2015-10-31: qty 125

## 2015-10-31 MED ORDER — INSULIN ASPART 100 UNIT/ML ~~LOC~~ SOLN: 0.0000 [IU] | Freq: Every day | SUBCUTANEOUS | Status: DC

## 2015-10-31 MED ORDER — TOPIRAMATE 25 MG PO TABS
50.0000 mg | ORAL_TABLET | Freq: Every day | ORAL | Status: DC
Start: 2015-10-31 — End: 2015-11-01
  Administered 2015-10-31: 50 mg
  Filled 2015-10-31: qty 2

## 2015-10-31 MED ORDER — LORAZEPAM 2 MG/ML IJ SOLN
0.5000 mg | Freq: Once | INTRAMUSCULAR | Status: AC
  Administered 2015-10-31: 0.5 mg via INTRAVENOUS
  Filled 2015-10-31: qty 1

## 2015-10-31 MED ORDER — INSULIN ASPART 100 UNIT/ML ~~LOC~~ SOLN
0.0000 [IU] | Freq: Three times a day (TID) | SUBCUTANEOUS | Status: DC
  Administered 2015-11-01: 2 [IU] via SUBCUTANEOUS
  Administered 2015-11-03: 1 [IU] via SUBCUTANEOUS

## 2015-10-31 MED ORDER — SODIUM CHLORIDE 0.9 % IV SOLN
750.0000 mg | Freq: Two times a day (BID) | INTRAVENOUS | Status: DC
  Administered 2015-10-31 – 2015-11-01 (×2): 750 mg via INTRAVENOUS
  Filled 2015-10-31 (×3): qty 7.5

## 2015-10-31 NOTE — Progress Notes (Signed)
Interval History:                                                                                                                      Kathryn [name] is an 82 y.o. female patient improving mental status gradually with dose reduction of her seizure medicines. No further clinical episodes concerning for seizures or altered mental status. In fact she had a very good day today, with improved level of alertness, was able to tolerate by mouth diet and ate more than 40% of her lunch and supper portions, per nursing staff. Of course, NG tube was removed. She is sleeping at the time of my evaluation late in the evening.      Past Medical History: Past Medical History  Diagnosis Date  . Depression   . Hypertension   . High cholesterol   . Rectal prolapse   . Hx of adenomatous colonic polyps   . Internal hemorrhoids   . Hypothyroidism   . Fecal incontinence   . TIA (transient ischemic attack)   . History of frequent urinary tract infections     recent  . Migraine   . Fatty liver 03/20/13  . Generalized ischemic cerebrovascular disease 10/09/2015  . Stroke [facility]) Sept 1, 2010; Sept 12, 2011    Past Surgical History  Procedure Laterality Date  . Colon surgery  2010, for prolapsed organs after hystecrtomy surgery  . Appendectomy  2008  . Cholecystectomy    . Lumbar laminectomy/decompression microdiscectomy N/A 01/07/2013    Procedure: LUMBAR LAMINECTOMY CENTRAL DECOMPRESSION L4-L5, BILATERAL FORAMENOTOMY L4,L5    ;  Surgeon: Tobi Bastos, MD;  Location: [facility];  Service: Orthopedics;  Laterality: N/A;  . Back surgery    . Hemorrhoid surgery  09/2008    Archie Endo 12/31/2010  . Abdominal hysterectomy  05/2007    Archie Endo 01/02/2011    Family History: Family History  Problem Relation Age of Onset  . Stroke Father   . Migraines Father   . CVA Father   . Heart attack Father   . Hypertension Maternal Aunt     x3  . Colon cancer Neg Hx   . CVA Mother     Social History:   reports that she has never  smoked. She has never used smokeless tobacco. She reports that she does not drink alcohol or use illicit drugs.  Allergies:  Allergies  Allergen Reactions  . Augmentin [Amoxicillin-Pot Clavulanate] Swelling and Diarrhea    Throat   . Citalopram Swelling    Eyes ears and throat swelling Throat and eyes   . Codeine Anaphylaxis and Shortness Of Breath  . Fish-Derived Products Anaphylaxis  . Hyoscyamine Anaphylaxis and Swelling  . Ibuprofen Swelling    Throat and eyes   . Latex Swelling and Rash    Swelling to troat  . Lipitor [Atorvastatin] Swelling    Muscle aches throat  . Septra [Bactrim] Anaphylaxis  . Shellfish Allergy Anaphylaxis  . Miconazole Rash  . Keflet [Cephalexin] Diarrhea  Upset stomach  . Meloxicam Diarrhea and Nausea And Vomiting  . Sulfa Antibiotics     Rash and also throat was tight  . Verapamil     Tachycardia and flushing  . Vicodin [Hydrocodone-Acetaminophen] Other (See Comments)    stroke  . Zoloft [Sertraline Hcl] Swelling    Red spots all over face, swelling of tongue and legs.   . Ciprofloxacin Nausea And Vomiting    Other reaction(s): Abdominal Pain  . Depakene [Valproate Sodium] Hives    All over body   . Isoptin Sr [Verapamil Hcl Er] Cough  . Lisinopril Cough    Fatigue and cough  . Telmisartan-Hctz Cough     Medications:                                                                                                                         Current facility-administered medications:  .  Place/Maintain arterial line, , , Until Discontinued **AND** 0.9 %  sodium chloride infusion, , Intra-arterial, PRN, Javier Glazier, MD, Last Rate: 10 mL/hr at 10/23/15 2000 .  acetaminophen (TYLENOL) solution 650 mg, 650 mg, Oral, Q6H PRN, Chesley Mires, MD, 650 mg at 10/28/15 2355 .  acetaminophen (TYLENOL) suppository 650 mg, 650 mg, Rectal, Q6H PRN, Gardiner Barefoot, NP, 650 mg at 10/30/15 [id number] .  amLODipine (NORVASC) tablet 5 mg, 5 mg, Oral,  Daily, Oswald Hillock, MD, 5 mg at 10/31/15 [id number] .  antiseptic oral rinse solution (CORINZ), 7 mL, Mouth Rinse, QID, Oswald Hillock, MD, 7 mL at 10/31/15 1742 .  chlorhexidine gluconate (PERIDEX) 0.12 % solution 15 mL, 15 mL, Mouth Rinse, BID, Chesley Mires, MD, 15 mL at 10/31/15 0800 .  dextrose 5 %-0.45 % sodium chloride infusion, , Intravenous, Continuous, Oswald Hillock, MD, Last Rate: 50 mL/hr at 10/30/15 0023 .  enoxaparin (LOVENOX) injection 40 mg, 40 mg, Subcutaneous, Q24H, Drema Dallas, DO, 40 mg at 10/31/15 [id number] .  feeding supplement (JEVITY 1.2 CAL) liquid 1,000 mL, 1,000 mL, Per Tube, Continuous, Oswald Hillock, MD, Last Rate: 50 mL/hr at 10/30/15 1542, 1,000 mL at 10/30/15 1542 .  hydrALAZINE (APRESOLINE) injection 5 mg, 5 mg, Intravenous, Q2H PRN, Ivor Costa, MD, 5 mg at 10/31/15 0218 .  [START ON 11/01/2015] insulin aspart (novoLOG) injection 0-15 Units, 0-15 Units, Subcutaneous, TID WC, Velvet Bathe, MD .  insulin aspart (novoLOG) injection 0-5 Units, 0-5 Units, Subcutaneous, QHS, Velvet Bathe, MD .  levETIRAcetam (KEPPRA) tablet 750 mg, 750 mg, Oral, BID, Niquita Digioia Fuller Mandril, MD .  LORazepam (ATIVAN) injection 1-2 mg, 1-2 mg, Intravenous, Q4H PRN, Kara Mead V, MD .  losartan (COZAAR) tablet 100 mg, 100 mg, Oral, Daily, Oswald Hillock, MD, 100 mg at 10/31/15 [id number] .  nystatin-triamcinolone (MYCOLOG II) cream 1 application, 1 application, Topical, BID, Lauren D Bajbus, RPH, 1 application at [id number] .  [DISCONTINUED] ondansetron (ZOFRAN) tablet 4 mg, 4 mg, Oral, Q6H PRN **OR** ondansetron (ZOFRAN) injection 4 mg, 4 mg,  Intravenous, Q6H PRN, Ivor Costa, MD .  pantoprazole sodium (PROTONIX) 40 mg/20 mL oral suspension 40 mg, 40 mg, Per Tube, QHS, Crystal Trellis Moment, RPH, 40 mg at 10/30/15 2211 .  polyethylene glycol (MIRALAX / GLYCOLAX) packet 17 g, 17 g, Oral, Daily, Raylene Miyamoto, MD, 17 g at 10/31/15 [phone] .  propranolol (INDERAL) tablet 40 mg, 40 mg, Oral, TID, Oswald Hillock, MD,  40 mg at 10/31/15 1741 .  Brooks, , Oral, Once, Raylene Miyamoto, MD .  sennosides [facility]) 8.8 MG/5ML syrup 5 mL, 5 mL, Per Tube, BID PRN, Chesley Mires, MD, 5 mL at 10/25/15 0024 .  simvastatin (ZOCOR) tablet 40 mg, 40 mg, Per Tube, q morning - 10a, Chesley Mires, MD, 40 mg at 10/31/15 0922 .  sodium chloride flush (NS) 0.9 % injection 10-40 mL, 10-40 mL, Intracatheter, Q12H, Raylene Miyamoto, MD, 20 mL at 10/31/15 1000 .  sodium chloride flush (NS) 0.9 % injection 10-40 mL, 10-40 mL, Intracatheter, PRN, Raylene Miyamoto, MD, 10 mL at 10/31/15 0516 .  sodium chloride flush (NS) 0.9 % injection 3 mL, 3 mL, Intravenous, Q12H, Ivor Costa, MD, 3 mL at 10/31/15 0927 .  topiramate (TOPAMAX) tablet 50 mg, 50 mg, Per Tube, QHS, Samira Acero Fuller Mandril, MD .  vitamin B-12 (CYANOCOBALAMIN) tablet 1,000 mcg, 1,000 mcg, Per Tube, Daily, Chesley Mires, MD, 1,000 mcg at 10/31/15 0921 .  vitamin C (ASCORBIC ACID) tablet 500 mg, 500 mg, Per Tube, Daily, Chesley Mires, MD, 500 mg at 10/31/15 0925 .  vitamin e oil OIL 200 Units, 200 Units, Per Tube, Daily, Chesley Mires, MD, 200 Units at 10/31/15 [id number]   Neurologic Examination:                                                                                                      Blood pressure 135/50, pulse 81, temperature 98.1 F (36.7 C), temperature source Oral, resp. rate 17, height 5\' 3"  (1.6 m), weight 53.978 kg (119 lb), SpO2 97 %.  the patient is sleeping.  Lab Results: Basic Metabolic Panel:  Recent Labs Lab 10/25/15 0550 10/26/15 0509 10/27/15 0412 10/29/15 0354 10/30/15 0353 10/31/15 0517  NA 140 140 141 141 140  --   K 3.1* 3.4* 3.3* 2.9* 3.1*  --   CL 107 107 104 105 106  --   CO2 24 25 22 25 25   --   GLUCOSE 127* 89 97 102* 305*  --   BUN 25* 25* 18 21* 19  --   CREATININE 0.44 0.47 0.46 0.54 0.55  --   CALCIUM 9.3 10.2 10.2 9.5 8.9  --   MG  --  1.9 1.7  --   --  1.3*  PHOS  --  3.5 2.7  --   --   --     Liver  Function Tests:  Recent Labs Lab 10/25/15 0550  AST 106*  ALT 136*  ALKPHOS 69  BILITOT <0.1*  PROT 6.2*  ALBUMIN 2.5*   No results for input(s): LIPASE, AMYLASE in the last 168  hours. No results for input(s): AMMONIA in the last 168 hours.  CBC:  Recent Labs Lab 10/29/15 0354  WBC 3.6*  HGB 10.5*  HCT 32.1*  MCV 93.3  PLT 266    Cardiac Enzymes: No results for input(s): CKTOTAL, CKMB, CKMBINDEX, TROPONINI in the last 168 hours.  Lipid Panel: No results for input(s): CHOL, TRIG, HDL, CHOLHDL, VLDL, LDLCALC in the last 168 hours.  CBG:  Recent Labs Lab 10/30/15 2303 10/31/15 0405 10/31/15 0808 10/31/15 1208 10/31/15 1626  GLUCAP 130* 145* 130* 167* 126*    Microbiology: Results for orders placed or performed during the hospital encounter of 10/08/15  Urine culture     Status: None   Collection Time: 10/09/15  1:43 AM  Result Value Ref Range Status   Specimen Description URINE, CATHETERIZED  Final   Special Requests NONE  Final   Culture   Final    >=100,000 COLONIES/mL DIPHTHEROIDS(CORYNEBACTERIUM SPECIES) Standardized susceptibility testing for this organism is not available.    Report Status 10/10/2015 FINAL  Final  Culture, blood (Routine X 2) w Reflex to ID Panel     Status: None   Collection Time: 10/09/15  5:30 AM  Result Value Ref Range Status   Specimen Description BLOOD LEFT HAND  Final   Special Requests IN PEDIATRIC BOTTLE 1CC  Final   Culture NO GROWTH 5 DAYS  Final   Report Status 10/14/2015 FINAL  Final  Culture, blood (Routine X 2) w Reflex to ID Panel     Status: None   Collection Time: 10/09/15  5:38 AM  Result Value Ref Range Status   Specimen Description BLOOD LEFT ARM  Final   Special Requests IN PEDIATRIC BOTTLE [place]  Final   Culture NO GROWTH 5 DAYS  Final   Report Status 10/14/2015 FINAL  Final  MRSA PCR Screening     Status: None   Collection Time: 10/10/15 11:39 PM  Result Value Ref Range Status   MRSA by PCR NEGATIVE  NEGATIVE Final    Comment:        The GeneXpert MRSA Assay (FDA approved for NASAL specimens only), is one component of a comprehensive MRSA colonization surveillance program. It is not intended to diagnose MRSA infection nor to guide or monitor treatment for MRSA infections.   CSF culture     Status: None   Collection Time: 10/11/15  1:33 PM  Result Value Ref Range Status   Specimen Description CSF  Final   Special Requests CSF NO 2  Final   Gram Stain   Final    CYTOSPIN SMEAR WBC PRESENT, PREDOMINANTLY PMN NO ORGANISMS SEEN    Culture NO GROWTH 3 DAYS  Final   Report Status 10/14/2015 FINAL  Final  Culture, respiratory (NON-Expectorated)     Status: None   Collection Time: 10/13/15  9:30 AM  Result Value Ref Range Status   Specimen Description ENDOTRACHEAL  Final   Special Requests NONE  Final   Gram Stain   Final    FEW WBC PRESENT,BOTH PMN AND MONONUCLEAR RARE SQUAMOUS EPITHELIAL CELLS PRESENT NO ORGANISMS SEEN Performed at [facility]    Culture   Final    FEW YEAST CONSISTENT WITH CANDIDA SPECIES Performed at [facility]    Report Status 10/16/2015 FINAL  Final    Imaging: Dg Abd 1 View  10/30/2015  CLINICAL DATA:  Feeding tube placement EXAM: ABDOMEN - 1 VIEW COMPARISON:  Study obtained earlier in the day FLUOROSCOPY TIME:  0  minutes 1 second; 1 acquired image FINDINGS: Feeding tube tip is at the junction of the duodenum and jejunum. Contrast flows freely through the tube into the proximal jejunum. The visualized bowel gas pattern is normal. IMPRESSION: Feeding tube tip is just beyond the fourth portion the duodenum in the proximal most aspect of the jejunum. Bowel gas pattern unremarkable. Electronically Signed   By: Lowella Grip III M.D.   On: 10/30/2015 14:21   Dg Abd Portable 1v  10/30/2015  CLINICAL DATA:  Feeding tube placement EXAM: PORTABLE ABDOMEN - 1 VIEW COMPARISON:  10/29/2015 FINDINGS: There is a feeding tube with the tube  coiled within the stomach and the metallic tip located along the fundus of the stomach. There is no bowel dilatation to suggest obstruction. There is no evidence of pneumoperitoneum, portal venous gas or pneumatosis. There are no pathologic calcifications along the expected course of the ureters. The osseous structures are unremarkable. IMPRESSION: Feeding tube with the tube coiled within the stomach and the metallic tip located along the fundus of the stomach. Electronically Signed   By: Kathreen Devoid   On: 10/30/2015 12:40   Dg Abd Portable 1v  10/29/2015  CLINICAL DATA:  Feeding tube placement. EXAM: PORTABLE ABDOMEN - 1 VIEW COMPARISON:  06/16/2014. FINDINGS: The feeding tube tip is in the stomach. It is curved back on itself with the tip in the fundal region. The bowel gas pattern is unremarkable. The lung bases are clear. IMPRESSION: Feeding tube tip coursing down into the stomach. It is curving back on itself with the tip probably in the fundal region. Electronically Signed   By: Marijo Sanes M.D.   On: 10/29/2015 16:48   Dg Addison Bailey G Tube Plc W/fl-no Rad  10/30/2015  CLINICAL DATA:  NASO G TUBE PLACEMENT WITH FLUORO Fluoroscopy was utilized by the requesting physician.  No radiographic interpretation.   Dg Swallowing Func-speech Pathology  10/31/2015  Objective Swallowing Evaluation: Type of Study: MBS-Modified Barium Swallow Study Patient Details Name: Kathryn [name] MRN: [id number] Date of Birth: 1930/10/04 Today's Date: 10/31/2015 Time: SLP Start Time (ACUTE ONLY): 1240-SLP Stop Time (ACUTE ONLY): 1300 SLP Time Calculation (min) (ACUTE ONLY): 20 min Past Medical History: Past Medical History Diagnosis Date . Depression  . Hypertension  . High cholesterol  . Rectal prolapse  . Hx of adenomatous colonic polyps  . Internal hemorrhoids  . Hypothyroidism  . Fecal incontinence  . TIA (transient ischemic attack)  . History of frequent urinary tract infections    recent . Migraine  . Fatty liver 03/20/13 .  Generalized ischemic cerebrovascular disease 10/09/2015 . Stroke [facility]) Sept 1, 2010; Sept 12, 2011 Past Surgical History: Past Surgical History Procedure Laterality Date . Colon surgery  2010, for prolapsed organs after hystecrtomy surgery . Appendectomy  2008 . Cholecystectomy   . Lumbar laminectomy/decompression microdiscectomy N/A 01/07/2013   Procedure: LUMBAR LAMINECTOMY CENTRAL DECOMPRESSION L4-L5, BILATERAL FORAMENOTOMY L4,L5    ;  Surgeon: Tobi Bastos, MD;  Location: [facility];  Service: Orthopedics;  Laterality: N/A; . Back surgery   . Hemorrhoid surgery  09/2008   Archie Endo 12/31/2010 . Abdominal hysterectomy  05/2007   /notes 01/02/2011 HPI: 82 y.o. female with h/o HTN, HLD, GERD, hypothyroidism, depression, anxiety, TIA, CVA x2 (05/2009, 05/2010), recurrent UTI, chronic back pain and chronic abdominal pain, who presented to ED 2/6 with AMS and visual hallucinations. Pt noted to have seizure 2/8 and neurology consulted. Intubated and sedated from 2/10-2/23 (13 days).  MR Brain 2/7 remote infarcts  basal ganglia and corona radiata bilaterally. Remote infarct at the junction of the R thalamus and posterior limb R internal capsule. CXR 2/21 no acute cardiopulmonary abnormality. No previous h/o SLP intervention noted in chart. No Data Recorded Assessment / Plan / Recommendation CHL IP CLINICAL IMPRESSIONS 10/31/2015 Therapy Diagnosis Mild-mod pharyngeal phase dysphagia;Mild-mod oral phase dysphagia Clinical Impression Pt presents with a mild-mod dysphagia with deconditioning and fluctuating MS remaining the primary barriers to progression to PO diet.  Pt maintained neck in flexion during study, with assist needed to elevate head to accept POs and to remain alert.  Nectar-thick liquids were immediately aspirated, eliciting a cough response; honey thick liquids were swallowed safely with adequate transition through pharynx and no penetration nor aspiration.  Purees required extra time for oral preparation; swallow was mildly  delayed but material passed through pharynx and into esophagus without deficit.  Recommend initiating a dysphagia 1 diet with honey-thick liquids; meds crushed in puree.  Removal of NG should be considered.  Diet may be advanced at bedside without repeating instrumental study due to adequate sensation of small amounts of aspiration.   Impact on safety and function Moderate aspiration risk   CHL IP TREATMENT RECOMMENDATION 10/31/2015 Treatment Recommendations Therapy as outlined in treatment plan below   Prognosis 10/26/2015 Prognosis for Safe Diet Advancement Fair Barriers to Reach Goals Severity of deficits;Behavior Barriers/Prognosis Comment -- CHL IP DIET RECOMMENDATION 10/31/2015 SLP Diet Recommendations Dysphagia 1 (Puree) solids;Honey thick liquids Liquid Administration via Spoon;Cup Medication Administration Crushed with puree Compensations Minimize environmental distractions;Small sips/bites Postural Changes Seated upright at 90 degrees   CHL IP OTHER RECOMMENDATIONS 10/31/2015 Recommended Consults -- Oral Care Recommendations Oral care BID Other Recommendations Order thickener from pharmacy   CHL IP FOLLOW UP RECOMMENDATIONS 10/31/2015 Follow up Recommendations Skilled Nursing facility   [facility] IP FREQUENCY AND DURATION 10/31/2015 Speech Therapy Frequency (ACUTE ONLY) min 2x/week Treatment Duration 2 weeks      CHL IP ORAL PHASE 10/31/2015 Oral Phase Impaired Oral - Pudding Teaspoon -- Oral - Pudding Cup -- Oral - Honey Teaspoon -- Oral - Honey Cup -- Oral - Nectar Teaspoon -- Oral - Nectar Cup -- Oral - Nectar Straw -- Oral - Thin Teaspoon -- Oral - Thin Cup -- Oral - Thin Straw -- Oral - Puree Weak lingual manipulation Oral - Mech Soft -- Oral - Regular -- Oral - Multi-Consistency -- Oral - Pill -- Oral Phase - Comment --  CHL IP PHARYNGEAL PHASE 10/31/2015 Pharyngeal Phase Impaired Pharyngeal- Pudding Teaspoon -- Pharyngeal -- Pharyngeal- Pudding Cup -- Pharyngeal -- Pharyngeal- Honey Teaspoon Delayed swallow  initiation-vallecula;Pharyngeal residue - pyriform Pharyngeal -- Pharyngeal- Honey Cup Delayed swallow initiation-vallecula;Pharyngeal residue - pyriform Pharyngeal -- Pharyngeal- Nectar Teaspoon -- Pharyngeal -- Pharyngeal- Nectar Cup Delayed swallow initiation-vallecula;Reduced airway/laryngeal closure;Penetration/Aspiration during swallow;Trace aspiration Pharyngeal Material enters airway, passes BELOW cords and not ejected out despite cough attempt by patient Pharyngeal- Nectar Straw -- Pharyngeal -- Pharyngeal- Thin Teaspoon -- Pharyngeal -- Pharyngeal- Thin Cup -- Pharyngeal -- Pharyngeal- Thin Straw -- Pharyngeal -- Pharyngeal- Puree -- Pharyngeal -- Pharyngeal- Mechanical Soft -- Pharyngeal -- Pharyngeal- Regular -- Pharyngeal -- Pharyngeal- Multi-consistency -- Pharyngeal -- Pharyngeal- Pill -- Pharyngeal -- Pharyngeal Comment --  CHL IP CERVICAL ESOPHAGEAL PHASE 10/31/2015 Cervical Esophageal Phase (No Data) Pudding Teaspoon -- Pudding Cup -- Honey Teaspoon -- Honey Cup -- Nectar Teaspoon -- Nectar Cup -- Nectar Straw -- Thin Teaspoon -- Thin Cup -- Thin Straw -- Puree -- Mechanical Soft -- Regular -- Multi-consistency -- Pill --  Cervical Esophageal Comment -- No flowsheet data found. Juan Quam Laurice 10/31/2015, 1:37 PM               Assessment and plan:   Kathryn [name] is an 82 y.o. female patient who has been having gradual improvement of her mental status with dose reduction of her seizure medicines, no further clinical episodes concerning for seizures and altered mental status. Recommend to continue reducing the dose of seizure medicines as follows, reduce Keppra to 750 twice a day, discontinue Dilantin and reduce Topamax dose to 50 mg at bedtime only. Case manager is working on placement in a skilled nursing facility.  We'll continue to follow.

## 2015-10-31 NOTE — Clinical Social Work Note (Addendum)
CSW spoke with patient's daughter in law and son in regards to bed offers, they are going to review options and make a decision.  Patient's family will update CSW with decision.  CSW received phone call from patient's son Scarlette Slice and they have agreed to have patient go to [facility] for short term rehab.  CSW updated [facility] that family have accepted their bed offer.  Patient has a bed available at SNF once she is medically ready for discharge and orders have been received.  Wiland Broom. [place], MSW, [place] 10/31/2015 4:22 PM

## 2015-10-31 NOTE — Progress Notes (Signed)
Speech Pathology:  MBSS complete. Full report located under chart review in imaging section.  Misako Roeder L. Nishka Heide, MA CCC/SLP Pager [phone]   

## 2015-10-31 NOTE — Progress Notes (Signed)
Physical Therapy Treatment Patient Details Name: Kathryn [name] MRN: [id number] DOB: Mar 12, 1931 Today's Date: 10/31/2015    History of Present Illness pt initially presented with Confusion and Hallucinations.  pt found to be encephalopathic and with UTI.  On 10/10/15 pt had seizure activity and contineud seizure activity that required sedation and intubation from 2/10 - 2/23.  pt with hx of CVA, Depression, HTN, Chronic Back Pain, and Anxiety.      PT Comments    Pt with poor activity tolerance. She sat EOB x 4 minutes, fatiguing quickly requiring return to bed. +2 total assist required for bed mobility.   Follow Up Recommendations  SNF     Equipment Recommendations  None recommended by PT    Recommendations for Other Services       Precautions / Restrictions Precautions Precautions: Fall Restrictions Weight Bearing Restrictions: No    Mobility  Bed Mobility   Bed Mobility: Rolling Rolling: Total assist;+2 for physical assistance   Supine to sit: Total assist;+2 for physical assistance Sit to supine: Total assist;+2 for physical assistance   General bed mobility comments: Pt more alert today at beginning of session but not physically participating in bed mobility.  Transfers                 General transfer comment: unable due to weakness, fatigue and lethargy.  Ambulation/Gait             General Gait Details: unable due to weakness, fatigue and lethargy.   Stairs            Wheelchair Mobility    Modified Rankin (Stroke Patients Only)       Balance   Sitting-balance support: Feet unsupported;No upper extremity supported Sitting balance-Leahy Scale: Zero Sitting balance - Comments: Total assist to maintain EOB sitting.                            Cognition Arousal/Alertness: Lethargic Behavior During Therapy: Flat affect Overall Cognitive Status: Impaired/Different from baseline Area of Impairment:  Orientation;Attention;Memory;Following commands;Safety/judgement;Awareness;Problem solving Orientation Level: Disoriented to;Place;Time;Situation Current Attention Level: Focused Memory: Decreased short-term memory Following Commands: Follows one step commands inconsistently Safety/Judgement: Decreased awareness of safety;Decreased awareness of deficits   Problem Solving: Slow processing;Decreased initiation;Difficulty sequencing;Requires verbal cues;Requires tactile cues General Comments: Pt with strong voice upon entering room but diminished to a whisper  quickly with fatigue. Pt perseverating on wanting food. She currently has an NG tube.    Exercises      General Comments        Pertinent Vitals/Pain Pain Assessment: No/denies pain Faces Pain Scale: No hurt    Home Living                      Prior Function            PT Goals (current goals can now be found in the care plan section) Acute Rehab PT Goals Patient Stated Goal: Not stated PT Goal Formulation: Patient unable to participate in goal setting Time For Goal Achievement: 11/09/15 Potential to Achieve Goals: Fair Progress towards PT goals: Not progressing toward goals - comment (remains total assist with mobility)    Frequency  Min 2X/week    PT Plan Current plan remains appropriate    Co-evaluation             End of Session   Activity Tolerance: Patient limited by fatigue;Patient limited by lethargy Patient left:  in bed;with call bell/phone within reach;with bed alarm set;Other (comment) (positioned in right sidelying)     Time: [id number] PT Time Calculation (min) (ACUTE ONLY): 13 min  Charges:  $Therapeutic Activity: 8-22 mins                    G Codes:      Lorriane Shire 10/31/2015, 11:47 AM

## 2015-10-31 NOTE — Progress Notes (Signed)
Speech Language Pathology Treatment: Dysphagia  Patient Details Name: Kathryn [name] MRN: [id number] DOB: 07-11-1931 Today's Date: 10/31/2015 Time: [id number] SLP Time Calculation (min) (ACUTE ONLY): 21 min  Assessment / Plan / Recommendation Clinical Impression  Pt more alert today; oriented to self, not place/time.  Demonstrates improved awareness, anticipation of POs and oral acceptance/manipulation; swallow response is present though likely delayed.  Ice chips, water elicit immediate cough, suggesting aspiration, but honey-thick liquids and purees appear to be tolerated with mod cues for alertness.  Recommend proceeding with MBS to determine nature of deficits, treatment focus, and diet that may pose least risk.    HPI HPI: 82 y.o. female with h/o HTN, HLD, GERD, hypothyroidism, depression, anxiety, TIA, CVA x2 (05/2009, 05/2010), recurrent UTI, chronic back pain and chronic abdominal pain, who presented to ED 2/6 with AMS and visual hallucinations. Pt noted to have seizure 2/8 and neurology consulted. Intubated and sedated from 2/10-2/23 (13 days).  MR Brain 2/7 remote infarcts basal ganglia and corona radiata bilaterally. Remote infarct at the junction of the R thalamus and posterior limb R internal capsule. CXR 2/21 no acute cardiopulmonary abnormality. No previous h/o SLP intervention noted in chart.      SLP Plan  Continue with current plan of care     Recommendations  Diet recommendations: NPO Postural Changes and/or Swallow Maneuvers: Seated upright 90 degrees             Oral Care Recommendations: Oral care QID Plan: Continue with current plan of care     GO                Kathryn [name] 10/31/2015, 11:23 AM

## 2015-10-31 NOTE — Progress Notes (Signed)
[facility] PROGRESS NOTE  JAIDY POFAHL [id number] DOB: 27-Nov-1930 DOA: 10/08/2015 PCP: Lilian Coma, MD  Assessment/Plan:  Status epilepticus- resolved,   patient was admitted to the ICU on 10/09/2015 with prolonged status epilepticus, was intubated and sedated with Diprivan,  was followed by neurology underwent continuous EEG monitoring which has been discontinued. Patient's seizures are controlled with multiple anticonvulsant regimen including Vimpat, Keppra, phenobarbital, Dilantin, Topamax.  - Neurology on board and following up  Metabolic encephalopathy - likely from multiple antiepileptic medications, Neurology has adjusted antiepileptic medications and discontinued phenobarbital. Vimpat and Keppra dose were reduced.  Nutrition- patient is currently nothing by mouth due to aspiration risk. Has been followed by speech therapy and still has risk for aspiration. I called and discussed with patient's son to consider feeding tube cor track. He agrees to put the feeding tube. We'll order cortak was ordered, and cortak team tried twice to place the tube and were unsuccessful. Will consult IR for panda placement. In the meantime continue  D5 half-normal saline at 50 mL per hour.   Hypokalemia- potassium was replaced but no follow-up lab work checked. As such will place order for potassium check  Acute respiratory failure in setting of status epilepticus- resolved  Hypertension- blood pressure is better controlled currently, has prn hydralazine, also on B blocker and amlodipine scheduled  DVT prophylaxis- Lovenox  Goals of care - patient has been lethargic without by mouth due to somnolence from multiple antiepileptic medications . Once the medications are adjusted , and patient started on tube feedings . Monitor the patient, if improved and eating well she can be discharged to skilled facility. If no improvement despite feeding and adjusted medications, consider palliative care  consultation for goals of care. Called and discussed with patient's son on phone and he agrees with this plan.    Code Status: DO NOT RESUSCITATE Family Communication: Discussed with patient's son on phone Disposition Plan: Skilled nursing facility   Consultants:  Neurology  Pulmonary  Procedures: CULTURES: 2/07 Urine >> diphtheroids 2/07 Blood >>NGTD 2/09 CSF >>NGTD  ANTIBIOTICS: 2/06 Levaquin >>off 2/09 Acyclovir >> 2/11 2/10 Vanco>>off 2/10 ceftaz>> off 2/11 sputum>> candida  Tubes 2/10 ETT >> 2/23  STUDIES and events:  2/06 Admit 2/08 Noted to have seizure, neuro consulted 2/06 CT head >> atrophy 2/07 MRI brain >> remote basal ganglia and corona radiata infarcts b/l, remote Rt thalamus and posterior limb internal capsule infarcts, global atrophy 2/08 EEG >> lt temporal region focal seizures, lt hemisphere slowing 2/09 LP >> 140 RBC, 2 WBC, protein 38, glucose 51 2/10 Status epilepticus >> to ICU, diprivan coma  2./12 - Sedated on vent, szs overnight  2/13 - afebrile with ongoing c-eeg and diprivan 10/16/15 - c-EEG off. PEr tech - PLEDS +. c-versed gtt started by neuro per RN.  MRI - New signal abnormality in the left thalamus  2/15 EEG - PLEDS 2/17 - EEG - PLEDS improved 2/18 PLEDS resolved 2/23- DNR, DNI, no trach option, extubated successfuly   Antibiotics: None  HPI/Subjective: 82 yo female brought to ER due to confusion after falling. She was also having visual hallucinations. She was noted to have seizure on 2/08 and neurology consulted. She was also noted to have fever. She had LP that was unrevealing. She was started on abx for possible UTI, and acyclovir for possible encephalitis. Her seizures persistent, and she was transferred to ICU for diprivan coma and intubation. A shunt was sedated on Diprivan and intubated. EEG performed on 10/17/2015 showed  periodic lateralized epileptiform discharges (PLED's). Which resolved on 10/20/2015. Patient  was extubated after discussion with family and was made DO NOT RESUSCITATE. She had failed swallow evaluation so was kept nothing by mouth.  Patient has no new complaints no acute issues overnight.  Objective: Filed Vitals:   10/31/15 0945 10/31/15 1443  BP: 162/81 135/50  Pulse: 89 81  Temp: 97.7 F (36.5 C) 98.1 F (36.7 C)  Resp: 17 17    Intake/Output Summary (Last 24 hours) at 10/31/15 1600 Last data filed at 10/31/15 0800  Gross per 24 hour  Intake    815 ml  Output    800 ml  Net     15 ml   Filed Weights   10/29/15 0500 10/30/15 0433 10/31/15 0500  Weight: 52.753 kg (116 lb 4.8 oz) 53.933 kg (118 lb 14.4 oz) 53.978 kg (119 lb)    Exam:   General:  Pt in nad  Cardiovascular: S1S2 RRR  Respiratory: Clear bilaterally, no wheezes  Abdomen: Soft, nontender  Musculoskeletal: No edema of the lower extremities  Data Reviewed: Basic Metabolic Panel:  Recent Labs Lab 10/25/15 0550 10/26/15 0509 10/27/15 0412 10/29/15 0354 10/30/15 0353 10/31/15 0517  NA 140 140 141 141 140  --   K 3.1* 3.4* 3.3* 2.9* 3.1*  --   CL 107 107 104 105 106  --   CO2 24 25 22 25 25   --   GLUCOSE 127* 89 97 102* 305*  --   BUN 25* 25* 18 21* 19  --   CREATININE 0.44 0.47 0.46 0.54 0.55  --   CALCIUM 9.3 10.2 10.2 9.5 8.9  --   MG  --  1.9 1.7  --   --  1.3*  PHOS  --  3.5 2.7  --   --   --    Liver Function Tests:  Recent Labs Lab 10/25/15 0550  AST 106*  ALT 136*  ALKPHOS 69  BILITOT <0.1*  PROT 6.2*  ALBUMIN 2.5*   CBC:  Recent Labs Lab 10/29/15 0354  WBC 3.6*  HGB 10.5*  HCT 32.1*  MCV 93.3  PLT 266   Cardiac Enzymes: No results for input(s): CKTOTAL, CKMB, CKMBINDEX, TROPONINI in the last 168 hours. BNP (last 3 results)  Recent Labs  10/09/15 0228  BNP 64.5    ProBNP (last 3 results) No results for input(s): PROBNP in the last 8760 hours.  CBG:  Recent Labs Lab 10/30/15 1950 10/30/15 2303 10/31/15 0405 10/31/15 0808 10/31/15 1208   GLUCAP 112* 130* 145* 130* 167*    No results found for this or any previous visit (from the past 240 hour(s)).   Studies: Dg Abd 1 View  10/30/2015  CLINICAL DATA:  Feeding tube placement EXAM: ABDOMEN - 1 VIEW COMPARISON:  Study obtained earlier in the day FLUOROSCOPY TIME:  0 minutes 1 second; 1 acquired image FINDINGS: Feeding tube tip is at the junction of the duodenum and jejunum. Contrast flows freely through the tube into the proximal jejunum. The visualized bowel gas pattern is normal. IMPRESSION: Feeding tube tip is just beyond the fourth portion the duodenum in the proximal most aspect of the jejunum. Bowel gas pattern unremarkable. Electronically Signed   By: Lowella Grip III M.D.   On: 10/30/2015 14:21   Dg Abd Portable 1v  10/30/2015  CLINICAL DATA:  Feeding tube placement EXAM: PORTABLE ABDOMEN - 1 VIEW COMPARISON:  10/29/2015 FINDINGS: There is a feeding tube with the tube coiled within the  stomach and the metallic tip located along the fundus of the stomach. There is no bowel dilatation to suggest obstruction. There is no evidence of pneumoperitoneum, portal venous gas or pneumatosis. There are no pathologic calcifications along the expected course of the ureters. The osseous structures are unremarkable. IMPRESSION: Feeding tube with the tube coiled within the stomach and the metallic tip located along the fundus of the stomach. Electronically Signed   By: Kathreen Devoid   On: 10/30/2015 12:40   Dg Abd Portable 1v  10/29/2015  CLINICAL DATA:  Feeding tube placement. EXAM: PORTABLE ABDOMEN - 1 VIEW COMPARISON:  06/16/2014. FINDINGS: The feeding tube tip is in the stomach. It is curved back on itself with the tip in the fundal region. The bowel gas pattern is unremarkable. The lung bases are clear. IMPRESSION: Feeding tube tip coursing down into the stomach. It is curving back on itself with the tip probably in the fundal region. Electronically Signed   By: Marijo Sanes M.D.   On:  10/29/2015 16:48   Dg [place] Bailey G Tube Plc W/fl-no Rad  10/30/2015  CLINICAL DATA:  NASO G TUBE PLACEMENT WITH FLUORO Fluoroscopy was utilized by the requesting physician.  No radiographic interpretation.   Dg Swallowing Func-speech Pathology  10/31/2015  Objective Swallowing Evaluation: Type of Study: MBS-Modified Barium Swallow Study Patient Details Name: ADREA SHUCK MRN: [id number] Date of Birth: 09-Nov-1930 Today's Date: 10/31/2015 Time: SLP Start Time (ACUTE ONLY): 1240-SLP Stop Time (ACUTE ONLY): 1300 SLP Time Calculation (min) (ACUTE ONLY): 20 min Past Medical History: Past Medical History Diagnosis Date . Depression  . Hypertension  . High cholesterol  . Rectal prolapse  . Hx of adenomatous colonic polyps  . Internal hemorrhoids  . Hypothyroidism  . Fecal incontinence  . TIA (transient ischemic attack)  . History of frequent urinary tract infections    recent . Migraine  . Fatty liver 03/20/13 . Generalized ischemic cerebrovascular disease 10/09/2015 . Stroke [facility]) Sept 1, 2010; Sept 12, 2011 Past Surgical History: Past Surgical History Procedure Laterality Date . Colon surgery  2010, for prolapsed organs after hystecrtomy surgery . Appendectomy  2008 . Cholecystectomy   . Lumbar laminectomy/decompression microdiscectomy N/A 01/07/2013   Procedure: LUMBAR LAMINECTOMY CENTRAL DECOMPRESSION L4-L5, BILATERAL FORAMENOTOMY L4,L5    ;  Surgeon: Tobi Bastos, MD;  Location: [facility]S;  Service: Orthopedics;  Laterality: N/A; . Back surgery   . Hemorrhoid surgery  09/2008   Archie Endo 12/31/2010 . Abdominal hysterectomy  05/2007   /notes 01/02/2011 HPI: 82 y.o. female with h/o HTN, HLD, GERD, hypothyroidism, depression, anxiety, TIA, CVA x2 (05/2009, 05/2010), recurrent UTI, chronic back pain and chronic abdominal pain, who presented to ED 2/6 with AMS and visual hallucinations. Pt noted to have seizure 2/8 and neurology consulted. Intubated and sedated from 2/10-2/23 (13 days).  MR Brain 2/7 remote infarcts basal ganglia and corona  radiata bilaterally. Remote infarct at the junction of the R thalamus and posterior limb R internal capsule. CXR 2/21 no acute cardiopulmonary abnormality. No previous h/o SLP intervention noted in chart. No Data Recorded Assessment / Plan / Recommendation CHL IP CLINICAL IMPRESSIONS 10/31/2015 Therapy Diagnosis Mild-mod pharyngeal phase dysphagia;Mild-mod oral phase dysphagia Clinical Impression Pt presents with a mild-mod dysphagia with deconditioning and fluctuating MS remaining the primary barriers to progression to PO diet.  Pt maintained neck in flexion during study, with assist needed to elevate head to accept POs and to remain alert.  Nectar-thick liquids were immediately aspirated, eliciting a cough response; honey  thick liquids were swallowed safely with adequate transition through pharynx and no penetration nor aspiration.  Purees required extra time for oral preparation; swallow was mildly delayed but material passed through pharynx and into esophagus without deficit.  Recommend initiating a dysphagia 1 diet with honey-thick liquids; meds crushed in puree.  Removal of NG should be considered.  Diet may be advanced at bedside without repeating instrumental study due to adequate sensation of small amounts of aspiration.   Impact on safety and function Moderate aspiration risk   CHL IP TREATMENT RECOMMENDATION 10/31/2015 Treatment Recommendations Therapy as outlined in treatment plan below   Prognosis 10/26/2015 Prognosis for Safe Diet Advancement Fair Barriers to Reach Goals Severity of deficits;Behavior Barriers/Prognosis Comment -- CHL IP DIET RECOMMENDATION 10/31/2015 SLP Diet Recommendations Dysphagia 1 (Puree) solids;Honey thick liquids Liquid Administration via Spoon;Cup Medication Administration Crushed with puree Compensations Minimize environmental distractions;Small sips/bites Postural Changes Seated upright at 90 degrees   CHL IP OTHER RECOMMENDATIONS 10/31/2015 Recommended Consults -- Oral Care  Recommendations Oral care BID Other Recommendations Order thickener from pharmacy   CHL IP FOLLOW UP RECOMMENDATIONS 10/31/2015 Follow up Recommendations Skilled Nursing facility   [facility] IP FREQUENCY AND DURATION 10/31/2015 Speech Therapy Frequency (ACUTE ONLY) min 2x/week Treatment Duration 2 weeks      CHL IP ORAL PHASE 10/31/2015 Oral Phase Impaired Oral - Pudding Teaspoon -- Oral - Pudding Cup -- Oral - Honey Teaspoon -- Oral - Honey Cup -- Oral - Nectar Teaspoon -- Oral - Nectar Cup -- Oral - Nectar Straw -- Oral - Thin Teaspoon -- Oral - Thin Cup -- Oral - Thin Straw -- Oral - Puree Weak lingual manipulation Oral - Mech Soft -- Oral - Regular -- Oral - Multi-Consistency -- Oral - Pill -- Oral Phase - Comment --  CHL IP PHARYNGEAL PHASE 10/31/2015 Pharyngeal Phase Impaired Pharyngeal- Pudding Teaspoon -- Pharyngeal -- Pharyngeal- Pudding Cup -- Pharyngeal -- Pharyngeal- Honey Teaspoon Delayed swallow initiation-vallecula;Pharyngeal residue - pyriform Pharyngeal -- Pharyngeal- Honey Cup Delayed swallow initiation-vallecula;Pharyngeal residue - pyriform Pharyngeal -- Pharyngeal- Nectar Teaspoon -- Pharyngeal -- Pharyngeal- Nectar Cup Delayed swallow initiation-vallecula;Reduced airway/laryngeal closure;Penetration/Aspiration during swallow;Trace aspiration Pharyngeal Material enters airway, passes BELOW cords and not ejected out despite cough attempt by patient Pharyngeal- Nectar Straw -- Pharyngeal -- Pharyngeal- Thin Teaspoon -- Pharyngeal -- Pharyngeal- Thin Cup -- Pharyngeal -- Pharyngeal- Thin Straw -- Pharyngeal -- Pharyngeal- Puree -- Pharyngeal -- Pharyngeal- Mechanical Soft -- Pharyngeal -- Pharyngeal- Regular -- Pharyngeal -- Pharyngeal- Multi-consistency -- Pharyngeal -- Pharyngeal- Pill -- Pharyngeal -- Pharyngeal Comment --  CHL IP CERVICAL ESOPHAGEAL PHASE 10/31/2015 Cervical Esophageal Phase (No Data) Pudding Teaspoon -- Pudding Cup -- Honey Teaspoon -- Honey Cup -- Nectar Teaspoon -- Nectar Cup -- Nectar  Straw -- Thin Teaspoon -- Thin Cup -- Thin Straw -- Puree -- Mechanical Soft -- Regular -- Multi-consistency -- Pill -- Cervical Esophageal Comment -- No flowsheet data found. Juan Quam Laurice 10/31/2015, 1:37 PM               Scheduled Meds: . amLODipine  5 mg Oral Daily  . antiseptic oral rinse  7 mL Mouth Rinse QID  . chlorhexidine gluconate  15 mL Mouth Rinse BID  . enoxaparin (LOVENOX) injection  40 mg Subcutaneous Q24H  . insulin aspart  0-15 Units Subcutaneous 6 times per day  . levETIRAcetam  1,000 mg Intravenous Q12H  . losartan  100 mg Oral Daily  . nystatin-triamcinolone  1 application Topical BID  . pantoprazole sodium  40 mg  Per Tube QHS  . phenytoin (DILANTIN) IV  50 mg Intravenous Q12H  . polyethylene glycol  17 g Oral Daily  . propranolol  40 mg Oral TID  . [place]   Oral Once  . simvastatin  40 mg Per Tube q morning - 10a  . sodium chloride flush  10-40 mL Intracatheter Q12H  . sodium chloride flush  3 mL Intravenous Q12H  . topiramate  50 mg Per Tube BID  . vitamin B-12  1,000 mcg Per Tube Daily  . vitamin C  500 mg Per Tube Daily  . vitamin e  200 Units Per Tube Daily   Continuous Infusions: . dextrose 5 % and 0.45% NaCl 50 mL/hr at 10/30/15 0023  . feeding supplement (JEVITY 1.2 CAL) 1,000 mL (10/30/15 1542)    Principal Problem:   Acute encephalopathy Active Problems:   Spinal stenosis, lumbar region, with neurogenic claudication   Generalized anxiety disorder   GERD (gastroesophageal reflux disease)   Essential hypertension, benign   Recurrent falls   History of stroke   Polypharmacy   Generalized ischemic cerebrovascular disease   Chronic abdominal pain   Status epilepticus ([place])   Encephalopathy   Endotracheal tube present   Acute respiratory failure with hypoxia (HCC)   Acute respiratory failure ([place])   Seizure (HCC)   Seizures ([place])   General weakness    Time spent: 25 min    Velvet Bathe  [facility] Pager [phone]. If 7PM-7AM, please contact night-coverage at [email or web address], password [facility] 10/31/2015, 4:00 PM  LOS: 22 days

## 2015-11-01 LAB — GLUCOSE, CAPILLARY
GLUCOSE-CAPILLARY: 124 mg/dL — AB (ref 65–99)
Glucose-Capillary: 97 mg/dL (ref 65–99)
Glucose-Capillary: 100 mg/dL — ABNORMAL HIGH (ref 65–99)
Glucose-Capillary: 106 mg/dL — ABNORMAL HIGH (ref 65–99)

## 2015-11-01 MED ORDER — SENNOSIDES 8.8 MG/5ML PO SYRP
5.0000 mL | ORAL_SOLUTION | Freq: Two times a day (BID) | ORAL | Status: DC | PRN
  Filled 2015-11-01: qty 5

## 2015-11-01 MED ORDER — POTASSIUM CHLORIDE CRYS ER 20 MEQ PO TBCR
40.0000 meq | EXTENDED_RELEASE_TABLET | Freq: Once | ORAL | Status: AC
  Administered 2015-11-01: 40 meq via ORAL
  Filled 2015-11-01: qty 2

## 2015-11-01 MED ORDER — AMANTADINE HCL 100 MG PO CAPS
100.0000 mg | ORAL_CAPSULE | Freq: Two times a day (BID) | ORAL | Status: DC
  Administered 2015-11-02 – 2015-11-03 (×4): 100 mg via ORAL
  Filled 2015-11-01 (×6): qty 1

## 2015-11-01 MED ORDER — TOPIRAMATE 25 MG PO TABS: 50.0000 mg | ORAL_TABLET | Freq: Every day | ORAL | Status: DC

## 2015-11-01 MED ORDER — VITAMIN C 500 MG PO TABS
500.0000 mg | ORAL_TABLET | Freq: Every day | ORAL | Status: DC
  Administered 2015-11-02 – 2015-11-03 (×2): 500 mg via ORAL
  Filled 2015-11-01 (×2): qty 1

## 2015-11-01 MED ORDER — ADULT MULTIVITAMIN W/MINERALS CH
1.0000 | ORAL_TABLET | Freq: Every day | ORAL | Status: DC
  Administered 2015-11-01 – 2015-11-03 (×2): 1 via ORAL
  Filled 2015-11-01 (×3): qty 1

## 2015-11-01 MED ORDER — BACLOFEN 10 MG PO TABS
10.0000 mg | ORAL_TABLET | Freq: Every day | ORAL | Status: DC
  Administered 2015-11-01 – 2015-11-02 (×2): 10 mg via ORAL
  Filled 2015-11-01 (×2): qty 1

## 2015-11-01 MED ORDER — VITAMIN B-12 1000 MCG PO TABS
1000.0000 ug | ORAL_TABLET | Freq: Every day | ORAL | Status: DC
  Administered 2015-11-02 – 2015-11-03 (×2): 1000 ug via ORAL
  Filled 2015-11-01 (×2): qty 1

## 2015-11-01 MED ORDER — PANTOPRAZOLE SODIUM 40 MG PO PACK
40.0000 mg | PACK | Freq: Every day | ORAL | Status: DC
  Administered 2015-11-01 – 2015-11-02 (×2): 40 mg via ORAL
  Filled 2015-11-01 (×2): qty 20

## 2015-11-01 MED ORDER — BACLOFEN 10 MG PO TABS
5.0000 mg | ORAL_TABLET | Freq: Every day | ORAL | Status: DC
  Filled 2015-11-01: qty 1

## 2015-11-01 MED ORDER — SODIUM CHLORIDE 0.9 % IV SOLN
500.0000 mg | Freq: Two times a day (BID) | INTRAVENOUS | Status: DC
  Administered 2015-11-01 – 2015-11-03 (×4): 500 mg via INTRAVENOUS
  Filled 2015-11-01 (×5): qty 5

## 2015-11-01 MED ORDER — LORAZEPAM 2 MG/ML IJ SOLN
2.0000 mg | Freq: Once | INTRAMUSCULAR | Status: DC | PRN
  Filled 2015-11-01: qty 1

## 2015-11-01 MED ORDER — VITAMIN E 100 UNT/0.25ML PO OIL
200.0000 [IU] | TOPICAL_OIL | Freq: Every day | ORAL | Status: DC
  Administered 2015-11-02 – 2015-11-03 (×3): 200 [IU] via ORAL
  Filled 2015-11-01 (×2): qty 0.5

## 2015-11-01 MED ORDER — SIMVASTATIN 40 MG PO TABS
40.0000 mg | ORAL_TABLET | Freq: Every day | ORAL | Status: DC
  Administered 2015-11-02 – 2015-11-03 (×2): 40 mg via ORAL
  Filled 2015-11-01 (×3): qty 1

## 2015-11-01 NOTE — Care Management Important Message (Signed)
Important Message  Patient Details  Name: Kathryn [name] MRN: [id number] Date of Birth: 1931-06-05   Medicare Important Message Given:  Yes    Timmey Lamba P Biggers 11/01/2015, 2:13 PM

## 2015-11-01 NOTE — Progress Notes (Signed)
Nutrition Follow-up   INTERVENTION:  Provide Magic cup TID with meals, each supplement provides 290 kcal and 9 grams of protein Provide Multivitamin with minerals daily Encourage PO intake   NUTRITION DIAGNOSIS:   Inadequate oral intake related to dysphagia as evidenced by meal completion < 50%.  Ongoing  GOAL:   Patient will meet greater than or equal to 90% of their needs  Unmet  MONITOR:   PO intake, Supplement acceptance, Weight trends, Labs, Skin  REASON FOR ASSESSMENT:   Consult Enteral/tube feeding initiation and management  ASSESSMENT:   [name] with PMH of HTN, hyperlipidemia, GERD, hypothyroidism, depression, anxiety, TIA, stroke, recurrent UTI, chronic back pain, chronic abdominal pain, who presented with altered mental status and hallucination.  Pt's diet was advanced to dysphagia 1 with honey-thick liquids yesterday afternoon and NGT was removed. Per nursing notes, pt ate 40% of dinner last night. Pt states that her appetite is good today, but lunch tray was at bedside with only a few bites consumed. Pt is agreeable to receiving Magic Cup ice cream on meal trays to improve calorie and protein intake.   Labs: low magnesium, low potassium, glucose stable  Diet Order:  DIET - DYS 1 Room service appropriate?: Yes; Fluid consistency:: Honey Thick  Skin:  Reviewed, no issues  Last BM:  2/24  Height:   Ht Readings from Last 1 Encounters:  10/21/15 5\' 3"  (1.6 m)    Weight:   Wt Readings from Last 1 Encounters:  10/31/15 119 lb (53.978 kg)    Ideal Body Weight:  52 kg  BMI:  Body mass index is 21.09 kg/(m^2).  Estimated Nutritional Needs:   Kcal:  1300-1500  Protein:  65-75 grams  Fluid:  >/= 1.5 L  EDUCATION NEEDS:   No education needs identified at this time  [place], LDN Inpatient Clinical Dietitian Pager: [phone] After Hours Pager: [phone]

## 2015-11-01 NOTE — Progress Notes (Signed)
Speech Language Pathology Treatment: Dysphagia  Patient Details Name: Kathryn [name] MRN: [id number] DOB: July 11, 1931 Today's Date: 11/01/2015 Time: [id number] SLP Time Calculation (min) (ACUTE ONLY): 22 min  Assessment / Plan / Recommendation Clinical Impression  Today is is sleepy but did awaken and stated she would like to try some breakfast with SLP offering.  RN (who had pt yesterday) reported she was sleepy in am but woke for good intake at lunch in pm.  She quickly closes her eys and appears to nod off.  She did state "I am not used to this kind of food".  SLP educated her to clinical reasoning for diet modification and importance of airway protection.    Baseline weak cough noted upon awakening pt *? secretion retention.  Cough continued intermittently post delayed swallow of few tsps magic cup, and tsps/cup boluses of honey thick liquid intake.  Again ? Impact of possible secretion = wet voice noted x1 that cleared with cued strong cough and re=swallow.  RN observed giving pt liquid medication via tsp without coughing post=swallow.   SLP updated sign with new strategies for airway protection and reviewed with pt.    Whitish coating noted on tongue, hopefully not consistent with oral candidiasis but advised RN to monitor and that SLP would document.  Recommend continue diet with very strict precautions and absolute full supervision, maximzing nutrition when pt fully alert and accepting.  Hopefully mentation will consistently improve to allow adequate po intake.  Will follow closely to assure tolerance.      HPI HPI: 82 y.o. female with h/o HTN, HLD, GERD, hypothyroidism, depression, anxiety, TIA, CVA x2 (05/2009, 05/2010), recurrent UTI, chronic back pain and chronic abdominal pain, who presented to ED 2/6 with AMS and visual hallucinations. Pt noted to have seizure 2/8 and neurology consulted. Intubated and sedated from 2/10-2/23 (13 days).  MR Brain 2/7 remote infarcts basal ganglia and corona  radiata bilaterally. Remote infarct at the junction of the R thalamus and posterior limb R internal capsule. CXR 2/21 no acute cardiopulmonary abnormality. No previous h/o SLP intervention noted in chart.      SLP Plan  Continue with current plan of care     Recommendations  Diet recommendations: Dysphagia 1 (puree);Honey-thick liquid Liquids provided via: Cup Medication Administration: Crushed with puree Supervision: Full supervision/cueing for compensatory strategies;Staff to assist with self feeding Compensations: Slow rate;Small sips/bites;Other (Comment) (cue to cough strongly if reflexive cough observed, cease intake if coughing ongoing) Postural Changes and/or Swallow Maneuvers: Seated upright 90 degrees;Upright 30-60 min after meal             Oral Care Recommendations: Oral care QID Follow up Recommendations: Skilled Nursing facility Plan: Continue with current plan of care     [place], [place], [place] [facility] SLP [phone]

## 2015-11-01 NOTE — Progress Notes (Addendum)
[facility] PROGRESS NOTE  MISHAAL CLATTERBUCK [id number] DOB: 1931-06-16 DOA: 10/08/2015 PCP: Lilian Coma, MD  Assessment/Plan:  Status epilepticus- resolved,   patient was admitted to the ICU on 10/09/2015 with prolonged status epilepticus, was intubated and sedated with Diprivan,  was followed by neurology underwent continuous EEG monitoring which has been discontinued. Patient's seizures are controlled with multiple anticonvulsant regimen including Vimpat, Keppra, phenobarbital, Dilantin, Topamax.  - Neurology on board and following up  Metabolic encephalopathy - likely from multiple antiepileptic medications, Neurology has adjusted antiepileptic medications and discontinued phenobarbital. Vimpat and Keppra dose were reduced. - Still somnolent will monitor and if somnolence persist will reconsult neurology  Nutrition-  Addendum: speech therapy evaluated and recommended the following: Recommend continue diet with very strict precautions and absolute full supervision, maximzing nutrition when pt fully alert and accepting.  Hypokalemia-  - Most likely due to poor oral intake - will place on kdur  Acute respiratory failure in setting of status epilepticus- resolved  Hypertension- blood pressure is better controlled currently, has prn hydralazine, also on B blocker and amlodipine scheduled  DVT prophylaxis- Lovenox  Goals of care - patient has been lethargic without by mouth due to somnolence from multiple antiepileptic medications . Once the medications are adjusted , and patient started on tube feedings . Monitor the patient, if improved and eating well she can be discharged to skilled facility. If no improvement despite feeding and adjusted medications, consider palliative care consultation for goals of care. Called and discussed with patient's son on phone and he agrees with this plan.    Code Status: DO NOT RESUSCITATE Family Communication: Discussed with patient's son on  phone Disposition Plan: Skilled nursing facility   Consultants:  Neurology  Pulmonary  Procedures: CULTURES: 2/07 Urine >> diphtheroids 2/07 Blood >>NGTD 2/09 CSF >>NGTD  ANTIBIOTICS: 2/06 Levaquin >>off 2/09 Acyclovir >> 2/11 2/10 Vanco>>off 2/10 ceftaz>> off 2/11 sputum>> candida  Tubes 2/10 ETT >> 2/23  STUDIES and events:  2/06 Admit 2/08 Noted to have seizure, neuro consulted 2/06 CT head >> atrophy 2/07 MRI brain >> remote basal ganglia and corona radiata infarcts b/l, remote Rt thalamus and posterior limb internal capsule infarcts, global atrophy 2/08 EEG >> lt temporal region focal seizures, lt hemisphere slowing 2/09 LP >> 140 RBC, 2 WBC, protein 38, glucose 51 2/10 Status epilepticus >> to ICU, diprivan coma  2./12 - Sedated on vent, szs overnight  2/13 - afebrile with ongoing c-eeg and diprivan 10/16/15 - c-EEG off. PEr tech - PLEDS +. c-versed gtt started by neuro per RN.  MRI - New signal abnormality in the left thalamus  2/15 EEG - PLEDS 2/17 - EEG - PLEDS improved 2/18 PLEDS resolved 2/23- DNR, DNI, no trach option, extubated successfuly   Antibiotics: None  HPI/Subjective: 82 yo female brought to ER due to confusion after falling. She was also having visual hallucinations. She was noted to have seizure on 2/08 and neurology consulted. She was also noted to have fever. She had LP that was unrevealing. She was started on abx for possible UTI, and acyclovir for possible encephalitis. Her seizures persistent, and she was transferred to ICU for diprivan coma and intubation. A shunt was sedated on Diprivan and intubated. EEG performed on 10/17/2015 showed periodic lateralized epileptiform discharges (PLED's). Which resolved on 10/20/2015. Patient was extubated after discussion with family and was made DO NOT RESUSCITATE. She had failed swallow evaluation so was kept nothing by mouth.  Patient has no new complaints no acute issues  overnight. Nursing  reported patient has been somnolent  Objective: Filed Vitals:   11/01/15 0958 11/01/15 1428  BP: 149/62 138/62  Pulse: 91 74  Temp: 99.1 F (37.3 C) 98.6 F (37 C)  Resp: 18 17    Intake/Output Summary (Last 24 hours) at 11/01/15 1625 Last data filed at 11/01/15 1428  Gross per 24 hour  Intake    680 ml  Output   1575 ml  Net   -895 ml   Filed Weights   10/29/15 0500 10/30/15 0433 10/31/15 0500  Weight: 52.753 kg (116 lb 4.8 oz) 53.933 kg (118 lb 14.4 oz) 53.978 kg (119 lb)    Exam:   General:  Pt in nad, awake and alert during my visit  Cardiovascular: S1S2 RRR  Respiratory: Clear bilaterally, no wheezes  Abdomen: Soft, nontender  Musculoskeletal: No edema of the lower extremities  Data Reviewed: Basic Metabolic Panel:  Recent Labs Lab 10/26/15 0509 10/27/15 0412 10/29/15 0354 10/30/15 0353 10/31/15 0517 10/31/15 2013  NA 140 141 141 140  --   --   K 3.4* 3.3* 2.9* 3.1*  --  3.1*  CL 107 104 105 106  --   --   CO2 25 22 25 25   --   --   GLUCOSE 89 97 102* 305*  --   --   BUN 25* 18 21* 19  --   --   CREATININE 0.47 0.46 0.54 0.55  --   --   CALCIUM 10.2 10.2 9.5 8.9  --   --   MG 1.9 1.7  --   --  1.3*  --   PHOS 3.5 2.7  --   --   --   --    Liver Function Tests: No results for input(s): AST, ALT, ALKPHOS, BILITOT, PROT, ALBUMIN in the last 168 hours. CBC:  Recent Labs Lab 10/29/15 0354  WBC 3.6*  HGB 10.5*  HCT 32.1*  MCV 93.3  PLT 266   Cardiac Enzymes: No results for input(s): CKTOTAL, CKMB, CKMBINDEX, TROPONINI in the last 168 hours. BNP (last 3 results)  Recent Labs  10/09/15 0228  BNP 64.5    ProBNP (last 3 results) No results for input(s): PROBNP in the last 8760 hours.  CBG:  Recent Labs Lab 10/31/15 1208 10/31/15 1626 10/31/15 2228 11/01/15 0636 11/01/15 1139  GLUCAP 167* 126* 144* 97 124*    No results found for this or any previous visit (from the past 240 hour(s)).   Studies: Dg Swallowing Func-speech  Pathology  10/31/2015  Objective Swallowing Evaluation: Type of Study: MBS-Modified Barium Swallow Study Patient Details Name: VIRGENE ADWELL MRN: [id number] Date of Birth: 1931/05/29 Today's Date: 10/31/2015 Time: SLP Start Time (ACUTE ONLY): 1240-SLP Stop Time (ACUTE ONLY): 1300 SLP Time Calculation (min) (ACUTE ONLY): 20 min Past Medical History: Past Medical History Diagnosis Date . Depression  . Hypertension  . High cholesterol  . Rectal prolapse  . Hx of adenomatous colonic polyps  . Internal hemorrhoids  . Hypothyroidism  . Fecal incontinence  . TIA (transient ischemic attack)  . History of frequent urinary tract infections    recent . Migraine  . Fatty liver 03/20/13 . Generalized ischemic cerebrovascular disease 10/09/2015 . Stroke [facility]) Sept 1, 2010; Sept 12, 2011 Past Surgical History: Past Surgical History Procedure Laterality Date . Colon surgery  2010, for prolapsed organs after hystecrtomy surgery . Appendectomy  2008 . Cholecystectomy   . Lumbar laminectomy/decompression microdiscectomy N/A 01/07/2013   Procedure: LUMBAR LAMINECTOMY CENTRAL  DECOMPRESSION L4-L5, BILATERAL FORAMENOTOMY L4,L5    ;  Surgeon: Tobi Bastos, MD;  Location: [facility];  Service: Orthopedics;  Laterality: N/A; . Back surgery   . Hemorrhoid surgery  09/2008   Ar[facility] Endo 12/31/2010 . Abdominal hysterectomy  05/2007   /notes 01/02/2011 HPI: 82 y.o. female with h/o HTN, HLD, GERD, hypothyroidism, depression, anxiety, TIA, CVA x2 (05/2009, 05/2010), recurrent UTI, chronic back pain and chronic abdominal pain, who presented to ED 2/6 with AMS and visual hallucinations. Pt noted to have seizure 2/8 and neurology consulted. Intubated and sedated from 2/10-2/23 (13 days).  MR Brain 2/7 remote infarcts basal ganglia and corona radiata bilaterally. Remote infarct at the junction of the R thalamus and posterior limb R internal capsule. CXR 2/21 no acute cardiopulmonary abnormality. No previous h/o SLP intervention noted in chart. No Data Recorded Assessment /  Plan / Recommendation CHL IP CLINICAL IMPRESSIONS 10/31/2015 Therapy Diagnosis Mild-mod pharyngeal phase dysphagia;Mild-mod oral phase dysphagia Clinical Impression Pt presents with a mild-mod dysphagia with deconditioning and fluctuating MS remaining the primary barriers to progression to PO diet.  Pt maintained neck in flexion during study, with assist needed to elevate head to accept POs and to remain alert.  Nectar-thick liquids were immediately aspirated, eliciting a cough response; honey thick liquids were swallowed safely with adequate transition through pharynx and no penetration nor aspiration.  Purees required extra time for oral preparation; swallow was mildly delayed but material passed through pharynx and into esophagus without deficit.  Recommend initiating a dysphagia 1 diet with honey-thick liquids; meds crushed in puree.  Removal of NG should be considered.  Diet may be advanced at bedside without repeating instrumental study due to adequate sensation of small amounts of aspiration.   Impact on safety and function Moderate aspiration risk   CHL IP TREATMENT RECOMMENDATION 10/31/2015 Treatment Recommendations Therapy as outlined in treatment plan below   Prognosis 10/26/2015 Prognosis for Safe Diet Advancement Fair Barriers to Reach Goals Severity of deficits;Behavior Barriers/Prognosis Comment -- CHL IP DIET RECOMMENDATION 10/31/2015 SLP Diet Recommendations Dysphagia 1 (Puree) solids;Honey thick liquids Liquid Administration via Spoon;Cup Medication Administration Crushed with puree Compensations Minimize environmental distractions;Small sips/bites Postural Changes Seated upright at 90 degrees   CHL IP OTHER RECOMMENDATIONS 10/31/2015 Recommended Consults -- Oral Care Recommendations Oral care BID Other Recommendations Order thickener from pharmacy   CHL IP FOLLOW UP RECOMMENDATIONS 10/31/2015 Follow up Recommendations Skilled Nursing facility   [facility] IP FREQUENCY AND DURATION 10/31/2015 Speech Therapy Frequency  (ACUTE ONLY) min 2x/week Treatment Duration 2 weeks      CHL IP ORAL PHASE 10/31/2015 Oral Phase Impaired Oral - Pudding Teaspoon -- Oral - Pudding Cup -- Oral - Honey Teaspoon -- Oral - Honey Cup -- Oral - Nectar Teaspoon -- Oral - Nectar Cup -- Oral - Nectar Straw -- Oral - Thin Teaspoon -- Oral - Thin Cup -- Oral - Thin Straw -- Oral - Puree Weak lingual manipulation Oral - Mech Soft -- Oral - Regular -- Oral - Multi-Consistency -- Oral - Pill -- Oral Phase - Comment --  CHL IP PHARYNGEAL PHASE 10/31/2015 Pharyngeal Phase Impaired Pharyngeal- Pudding Teaspoon -- Pharyngeal -- Pharyngeal- Pudding Cup -- Pharyngeal -- Pharyngeal- Honey Teaspoon Delayed swallow initiation-vallecula;Pharyngeal residue - pyriform Pharyngeal -- Pharyngeal- Honey Cup Delayed swallow initiation-vallecula;Pharyngeal residue - pyriform Pharyngeal -- Pharyngeal- Nectar Teaspoon -- Pharyngeal -- Pharyngeal- Nectar Cup Delayed swallow initiation-vallecula;Reduced airway/laryngeal closure;Penetration/Aspiration during swallow;Trace aspiration Pharyngeal Material enters airway, passes BELOW cords and not ejected out despite cough attempt by patient  Pharyngeal- Nectar Straw -- Pharyngeal -- Pharyngeal- Thin Teaspoon -- Pharyngeal -- Pharyngeal- Thin Cup -- Pharyngeal -- Pharyngeal- Thin Straw -- Pharyngeal -- Pharyngeal- Puree -- Pharyngeal -- Pharyngeal- Mechanical Soft -- Pharyngeal -- Pharyngeal- Regular -- Pharyngeal -- Pharyngeal- Multi-consistency -- Pharyngeal -- Pharyngeal- Pill -- Pharyngeal -- Pharyngeal Comment --  CHL IP CERVICAL ESOPHAGEAL PHASE 10/31/2015 Cervical Esophageal Phase (No Data) Pudding Teaspoon -- Pudding Cup -- Honey Teaspoon -- Honey Cup -- Nectar Teaspoon -- Nectar Cup -- Nectar Straw -- Thin Teaspoon -- Thin Cup -- Thin Straw -- Puree -- Mechanical Soft -- Regular -- Multi-consistency -- Pill -- Cervical Esophageal Comment -- No flowsheet data found. Juan Quam Laurice 10/31/2015, 1:37 PM               Scheduled  Meds: . amLODipine  5 mg Oral Daily  . antiseptic oral rinse  7 mL Mouth Rinse QID  . chlorhexidine gluconate  15 mL Mouth Rinse BID  . enoxaparin (LOVENOX) injection  40 mg Subcutaneous Q24H  . insulin aspart  0-15 Units Subcutaneous TID WC  . insulin aspart  0-5 Units Subcutaneous QHS  . levETIRAcetam  750 mg Intravenous Q12H  . losartan  100 mg Oral Daily  . nystatin-triamcinolone  1 application Topical BID  . pantoprazole sodium  40 mg Oral QHS  . polyethylene glycol  17 g Oral Daily  . propranolol  40 mg Oral TID  . [place]   Oral Once  . [START ON 11/02/2015] simvastatin  40 mg Oral Daily  . sodium chloride flush  10-40 mL Intracatheter Q12H  . sodium chloride flush  3 mL Intravenous Q12H  . topiramate  50 mg Oral QHS  . [START ON 11/02/2015] vitamin B-12  1,000 mcg Oral Daily  . [START ON 11/02/2015] vitamin C  500 mg Oral Daily  . [START ON 11/02/2015] vitamin e  200 Units Oral Daily   Continuous Infusions: . dextrose 5 % and 0.45% NaCl 50 mL/hr at 10/30/15 0023    Principal Problem:   Acute encephalopathy Active Problems:   Spinal stenosis, lumbar region, with neurogenic claudication   Generalized anxiety disorder   GERD (gastroesophageal reflux disease)   Essential hypertension, benign   Recurrent falls   History of stroke   Polypharmacy   Generalized ischemic cerebrovascular disease   Chronic abdominal pain   Status epilepticus ([place])   Encephalopathy   Endotracheal tube present   Acute respiratory failure with hypoxia (HCC)   Acute respiratory failure ([place])   Seizure (HCC)   Seizures ([place])   General weakness  Time spent: 25 min  Velvet Bathe  [facility] Pager [phone]. If 7PM-7AM, please contact night-coverage at [email or web address], password [facility] 11/01/2015, 4:25 PM  LOS: 23 days

## 2015-11-01 NOTE — Clinical Social Work Note (Signed)
CSW updated patient's son that she is not medically ready for discharge today per md.  CSW updated [facility] who is aware and can still take patient when she is medically ready for discharge.  CSW to continue to follow patient's progress.  Shryock Broom. [place], MSW, [facility] 11/01/2015 5:31 PM

## 2015-11-01 NOTE — Progress Notes (Signed)
Interval History:                                                                                                                      Kathryn [name] is an 82 y.o. female patient who  was in status epilepticus previously, now recovering from the encephalopathy. Her seizure medication doses have been gradually reduced over the past few days which is been working well as she is been gradually more and more awake and able to status physical therapy, doing good by mouth intake. She has been complaining of neck pain and neck spasms. Planned to be discharged to a skilled nursing facility tomorrow at the bed is available. No further seizure like episodes of unresponsiveness clinically noted.  Her son and daughter-in-law at bedside, discussed her care.   Past Medical History: Past Medical History  Diagnosis Date  . Depression   . Hypertension   . High cholesterol   . Rectal prolapse   . Hx of adenomatous colonic polyps   . Internal hemorrhoids   . Hypothyroidism   . Fecal incontinence   . TIA (transient ischemic attack)   . History of frequent urinary tract infections     recent  . Migraine   . Fatty liver 03/20/13  . Generalized ischemic cerebrovascular disease 10/09/2015  . Stroke [facility]) Sept 1, 2010; Sept 12, 2011    Past Surgical History  Procedure Laterality Date  . Colon surgery  2010, for prolapsed organs after hystecrtomy surgery  . Appendectomy  2008  . Cholecystectomy    . Lumbar laminectomy/decompression microdiscectomy N/A 01/07/2013    Procedure: LUMBAR LAMINECTOMY CENTRAL DECOMPRESSION L4-L5, BILATERAL FORAMENOTOMY L4,L5    ;  Surgeon: Tobi Bastos, MD;  Location: [facility];  Service: Orthopedics;  Laterality: N/A;  . Back surgery    . Hemorrhoid surgery  09/2008    Archie Endo 12/31/2010  . Abdominal hysterectomy  05/2007    Archie Endo 01/02/2011    Family History: Family History  Problem Relation Age of Onset  . Stroke Father   . Migraines Father   . CVA Father   . Heart attack Father    . Hypertension Maternal Aunt     x3  . Colon cancer Neg Hx   . CVA Mother     Social History:   reports that she has never smoked. She has never used smokeless tobacco. She reports that she does not drink alcohol or use illicit drugs.  Allergies:  Allergies  Allergen Reactions  . Augmentin [Amoxicillin-Pot Clavulanate] Swelling and Diarrhea    Throat   . Citalopram Swelling    Eyes ears and throat swelling Throat and eyes   . Codeine Anaphylaxis and Shortness Of Breath  . Fish-Derived Products Anaphylaxis  . Hyoscyamine Anaphylaxis and Swelling  . Ibuprofen Swelling    Throat and eyes   . Latex Swelling and Rash    Swelling to troat  . Lipitor [Atorvastatin] Swelling    Muscle aches throat  . Septra [Bactrim] Anaphylaxis  .  Shellfish Allergy Anaphylaxis  . Miconazole Rash  . Keflet [Cephalexin] Diarrhea    Upset stomach  . Meloxicam Diarrhea and Nausea And Vomiting  . Sulfa Antibiotics     Rash and also throat was tight  . Verapamil     Tachycardia and flushing  . Vicodin [Hydrocodone-Acetaminophen] Other (See Comments)    stroke  . Zoloft [Sertraline Hcl] Swelling    Red spots all over face, swelling of tongue and legs.   . Ciprofloxacin Nausea And Vomiting    Other reaction(s): Abdominal Pain  . Depakene [Valproate Sodium] Hives    All over body   . Isoptin Sr [Verapamil Hcl Er] Cough  . Lisinopril Cough    Fatigue and cough  . Telmisartan-Hctz Cough     Medications:                                                                                                                         Current facility-administered medications:  .  Place/Maintain arterial line, , , Until Discontinued **AND** 0.9 %  sodium chloride infusion, , Intra-arterial, PRN, Javier Glazier, MD, Last Rate: 10 mL/hr at 10/23/15 2000 .  acetaminophen (TYLENOL) solution 650 mg, 650 mg, Oral, Q6H PRN, Chesley Mires, MD, 650 mg at 10/28/15 2355 .  acetaminophen (TYLENOL) suppository 650  mg, 650 mg, Rectal, Q6H PRN, Gardiner Barefoot, NP, 650 mg at 10/30/15 [id number] .  [START ON 11/02/2015] amantadine (SYMMETREL) capsule 100 mg, 100 mg, Oral, BID, Jadan Rouillard Fuller Mandril, MD .  amLODipine (NORVASC) tablet 5 mg, 5 mg, Oral, Daily, Oswald Hillock, MD, 5 mg at 11/01/15 1007 .  antiseptic oral rinse solution (CORINZ), 7 mL, Mouth Rinse, QID, Oswald Hillock, MD, 7 mL at 11/01/15 1703 .  baclofen (LIORESAL) tablet 10 mg, 10 mg, Oral, QHS, Herbert Marken Fuller Mandril, MD .  Derrill Memo ON 11/02/2015] baclofen (LIORESAL) tablet 5 mg, 5 mg, Oral, QPC breakfast, Francely Craw Fuller Mandril, MD .  chlorhexidine gluconate (PERIDEX) 0.12 % solution 15 mL, 15 mL, Mouth Rinse, BID, Chesley Mires, MD, 15 mL at 11/01/15 1952 .  dextrose 5 %-0.45 % sodium chloride infusion, , Intravenous, Continuous, Oswald Hillock, MD, Last Rate: 50 mL/hr at 10/30/15 0023 .  enoxaparin (LOVENOX) injection 40 mg, 40 mg, Subcutaneous, Q24H, Raelyn Ensign Sumner, DO, 40 mg at 11/01/15 1006 .  hydrALAZINE (APRESOLINE) injection 5 mg, 5 mg, Intravenous, Q2H PRN, Ivor Costa, MD, 5 mg at 10/31/15 0218 .  insulin aspart (novoLOG) injection 0-15 Units, 0-15 Units, Subcutaneous, TID WC, Velvet Bathe, MD, 2 Units at 11/01/15 1200 .  insulin aspart (novoLOG) injection 0-5 Units, 0-5 Units, Subcutaneous, QHS, Velvet Bathe, MD, 0 Units at 10/31/15 2200 .  levETIRAcetam (KEPPRA) 500 mg in sodium chloride 0.9 % 100 mL IVPB, 500 mg, Intravenous, Q12H, Duvid Smalls Fuller Mandril, MD .  LORazepam (ATIVAN) injection 2 mg, 2 mg, Intravenous, Once PRN, Velvet Bathe, MD .  losartan (COZAAR) tablet 100 mg, 100 mg, Oral,  Daily, Oswald Hillock, MD, 100 mg at 11/01/15 1007 .  multivitamin with minerals tablet 1 tablet, 1 tablet, Oral, Daily, Velvet Bathe, MD, 1 tablet at 11/01/15 1702 .  nystatin-triamcinolone (MYCOLOG II) cream 1 application, 1 application, Topical, BID, Lauren D Bajbus, RPH, 1 application at [id number] .  [DISCONTINUED] ondansetron (ZOFRAN)  tablet 4 mg, 4 mg, Oral, Q6H PRN **OR** ondansetron (ZOFRAN) injection 4 mg, 4 mg, Intravenous, Q6H PRN, Ivor Costa, MD .  pantoprazole sodium (PROTONIX) 40 mg/20 mL oral suspension 40 mg, 40 mg, Oral, QHS, Skeet Simmer, RPH .  polyethylene glycol (MIRALAX / GLYCOLAX) packet 17 g, 17 g, Oral, Daily, Raylene Miyamoto, MD, 17 g at 11/01/15 1005 .  potassium chloride SA (K-DUR,KLOR-CON) CR tablet 40 mEq, 40 mEq, Oral, Once, Velvet Bathe, MD .  propranolol (INDERAL) tablet 40 mg, 40 mg, Oral, TID, Oswald Hillock, MD, 40 mg at 11/01/15 1702 .  RESOURCE THICKENUP CLEAR, , Oral, Once, Raylene Miyamoto, MD .  sennosides [facility]) 8.8 MG/5ML syrup 5 mL, 5 mL, Oral, BID PRN, Skeet Simmer, RPH .  [START ON 11/02/2015] simvastatin (ZOCOR) tablet 40 mg, 40 mg, Oral, Daily, Skeet Simmer, RPH .  sodium chloride flush (NS) 0.9 % injection 10-40 mL, 10-40 mL, Intracatheter, Q12H, Raylene Miyamoto, MD, 20 mL at 11/01/15 1009 .  sodium chloride flush (NS) 0.9 % injection 10-40 mL, 10-40 mL, Intracatheter, PRN, Raylene Miyamoto, MD, 20 mL at 11/01/15 0751 .  sodium chloride flush (NS) 0.9 % injection 3 mL, 3 mL, Intravenous, Q12H, Ivor Costa, MD, 3 mL at 10/31/15 0927 .  [START ON 11/02/2015] vitamin B-12 (CYANOCOBALAMIN) tablet 1,000 mcg, 1,000 mcg, Oral, Daily, Skeet Simmer, RPH .  [START ON 11/02/2015] vitamin C (ASCORBIC ACID) tablet 500 mg, 500 mg, Oral, Daily, Skeet Simmer, RPH .  [START ON 11/02/2015] vitamin e oil OIL 200 Units, 200 Units, Oral, Daily, Skeet Simmer, [facility]   Neurologic Examination:                                                                                                      Blood pressure 161/70, pulse 72, temperature 98.6 F (37 C), temperature source Oral, resp. rate 15, height 5\' 3"  (1.6 m), weight 53.978 kg (119 lb), SpO2 98 %. She is alert, able to spontaneously greet and follow commands easily. She has hypophonia with moderate rigidity in bilateral upper extremities  intermittent resting tremor in bilateral upper extremities as well as evidence of cervical dystonia with left laterocollis mild right torticollis, in distress from neck pain. She has difficulty with turning her head sideways due to cervical dystonia.    Lab Results: Basic Metabolic Panel:  Recent Labs Lab 10/26/15 0509 10/27/15 0412 10/29/15 0354 10/30/15 0353 10/31/15 0517 10/31/15 2013  NA 140 141 141 140  --   --   K 3.4* 3.3* 2.9* 3.1*  --  3.1*  CL 107 104 105 106  --   --   CO2 25 22 25 25   --   --  GLUCOSE 89 97 102* 305*  --   --   BUN 25* 18 21* 19  --   --   CREATININE 0.47 0.46 0.54 0.55  --   --   CALCIUM 10.2 10.2 9.5 8.9  --   --   MG 1.9 1.7  --   --  1.3*  --   PHOS 3.5 2.7  --   --   --   --     Liver Function Tests: No results for input(s): AST, ALT, ALKPHOS, BILITOT, PROT, ALBUMIN in the last 168 hours. No results for input(s): LIPASE, AMYLASE in the last 168 hours. No results for input(s): AMMONIA in the last 168 hours.  CBC:  Recent Labs Lab 10/29/15 0354  WBC 3.6*  HGB 10.5*  HCT 32.1*  MCV 93.3  PLT 266    Cardiac Enzymes: No results for input(s): CKTOTAL, CKMB, CKMBINDEX, TROPONINI in the last 168 hours.  Lipid Panel: No results for input(s): CHOL, TRIG, HDL, CHOLHDL, VLDL, LDLCALC in the last 168 hours.  CBG:  Recent Labs Lab 10/31/15 1208 10/31/15 1626 10/31/15 2228 11/01/15 0636 11/01/15 1139  GLUCAP 167* 126* 144* 97 124*    Microbiology: Results for orders placed or performed during the hospital encounter of 10/08/15  Urine culture     Status: None   Collection Time: 10/09/15  1:43 AM  Result Value Ref Range Status   Specimen Description URINE, CATHETERIZED  Final   Special Requests NONE  Final   Culture   Final    >=100,000 COLONIES/mL DIPHTHEROIDS(CORYNEBACTERIUM SPECIES) Standardized susceptibility testing for this organism is not available.    Report Status 10/10/2015 FINAL  Final  Culture, blood (Routine X 2)  w Reflex to ID Panel     Status: None   Collection Time: 10/09/15  5:30 AM  Result Value Ref Range Status   Specimen Description BLOOD LEFT HAND  Final   Special Requests IN PEDIATRIC BOTTLE 1CC  Final   Culture NO GROWTH 5 DAYS  Final   Report Status 10/14/2015 FINAL  Final  Culture, blood (Routine X 2) w Reflex to ID Panel     Status: None   Collection Time: 10/09/15  5:38 AM  Result Value Ref Range Status   Specimen Description BLOOD LEFT ARM  Final   Special Requests IN PEDIATRIC BOTTLE [place]  Final   Culture NO GROWTH 5 DAYS  Final   Report Status 10/14/2015 FINAL  Final  MRSA PCR Screening     Status: None   Collection Time: 10/10/15 11:39 PM  Result Value Ref Range Status   MRSA by PCR NEGATIVE NEGATIVE Final    Comment:        The GeneXpert MRSA Assay (FDA approved for NASAL specimens only), is one component of a comprehensive MRSA colonization surveillance program. It is not intended to diagnose MRSA infection nor to guide or monitor treatment for MRSA infections.   CSF culture     Status: None   Collection Time: 10/11/15  1:33 PM  Result Value Ref Range Status   Specimen Description CSF  Final   Special Requests CSF NO 2  Final   Gram Stain   Final    CYTOSPIN SMEAR WBC PRESENT, PREDOMINANTLY PMN NO ORGANISMS SEEN    Culture NO GROWTH 3 DAYS  Final   Report Status 10/14/2015 FINAL  Final  Culture, respiratory (NON-Expectorated)     Status: None   Collection Time: 10/13/15  9:30 AM  Result Value Ref Range  Status   Specimen Description ENDOTRACHEAL  Final   Special Requests NONE  Final   Gram Stain   Final    FEW WBC PRESENT,BOTH PMN AND MONONUCLEAR RARE SQUAMOUS EPITHELIAL CELLS PRESENT NO ORGANISMS SEEN Performed at [facility]    Culture   Final    FEW YEAST CONSISTENT WITH CANDIDA SPECIES Performed at [facility]    Report Status 10/16/2015 FINAL  Final    Imaging: Dg Abd 1 View  10/30/2015  CLINICAL DATA:  Feeding tube  placement EXAM: ABDOMEN - 1 VIEW COMPARISON:  Study obtained earlier in the day FLUOROSCOPY TIME:  0 minutes 1 second; 1 acquired image FINDINGS: Feeding tube tip is at the junction of the duodenum and jejunum. Contrast flows freely through the tube into the proximal jejunum. The visualized bowel gas pattern is normal. IMPRESSION: Feeding tube tip is just beyond the fourth portion the duodenum in the proximal most aspect of the jejunum. Bowel gas pattern unremarkable. Electronically Signed   By: Lowella Grip III M.D.   On: 10/30/2015 14:21   Dg Abd Portable 1v  10/30/2015  CLINICAL DATA:  Feeding tube placement EXAM: PORTABLE ABDOMEN - 1 VIEW COMPARISON:  10/29/2015 FINDINGS: There is a feeding tube with the tube coiled within the stomach and the metallic tip located along the fundus of the stomach. There is no bowel dilatation to suggest obstruction. There is no evidence of pneumoperitoneum, portal venous gas or pneumatosis. There are no pathologic calcifications along the expected course of the ureters. The osseous structures are unremarkable. IMPRESSION: Feeding tube with the tube coiled within the stomach and the metallic tip located along the fundus of the stomach. Electronically Signed   By: Kathreen Devoid   On: 10/30/2015 12:40   Dg Abd Portable 1v  10/29/2015  CLINICAL DATA:  Feeding tube placement. EXAM: PORTABLE ABDOMEN - 1 VIEW COMPARISON:  06/16/2014. FINDINGS: The feeding tube tip is in the stomach. It is curved back on itself with the tip in the fundal region. The bowel gas pattern is unremarkable. The lung bases are clear. IMPRESSION: Feeding tube tip coursing down into the stomach. It is curving back on itself with the tip probably in the fundal region. Electronically Signed   By: Marijo Sanes M.D.   On: 10/29/2015 16:48   Dg Addison Bailey G Tube Plc W/fl-no Rad  10/30/2015  CLINICAL DATA:  NASO G TUBE PLACEMENT WITH FLUORO Fluoroscopy was utilized by the requesting physician.  No radiographic  interpretation.   Dg Swallowing Func-speech Pathology  10/31/2015  Objective Swallowing Evaluation: Type of Study: MBS-Modified Barium Swallow Study Patient Details Name: JEANNI BARRATT MRN: [id number] Date of Birth: [birth year] Today's Date: 10/31/2015 Time: SLP Start Time (ACUTE ONLY): 1240-SLP Stop Time (ACUTE ONLY): 1300 SLP Time Calculation (min) (ACUTE ONLY): 20 min Past Medical History: Past Medical History Diagnosis Date . Depression  . Hypertension  . High cholesterol  . Rectal prolapse  . Hx of adenomatous colonic polyps  . Internal hemorrhoids  . Hypothyroidism  . Fecal incontinence  . TIA (transient ischemic attack)  . History of frequent urinary tract infections    recent . Migraine  . Fatty liver 03/20/13 . Generalized ischemic cerebrovascular disease 10/09/2015 . Stroke [facility]) Sept 1, 2010; Sept 12, 2011 Past Surgical History: Past Surgical History Procedure Laterality Date . Colon surgery  2010, for prolapsed organs after hystecrtomy surgery . Appendectomy  2008 . Cholecystectomy   . Lumbar laminectomy/decompression microdiscectomy N/A 01/07/2013   Procedure: LUMBAR  LAMINECTOMY CENTRAL DECOMPRESSION L4-L5, BILATERAL FORAMENOTOMY L4,L5    ;  Surgeon: Tobi Bastos, MD;  Location: [facility];  Service: Orthopedics;  Laterality: N/A; . Back surgery   . Hemorrhoid surgery  09/2008   Archie Endo 12/31/2010 . Abdominal hysterectomy  05/2007   /notes 01/02/2011 HPI: 82 y.o. female with h/o HTN, HLD, GERD, hypothyroidism, depression, anxiety, TIA, CVA x2 (05/2009, 05/2010), recurrent UTI, chronic back pain and chronic abdominal pain, who presented to ED 2/6 with AMS and visual hallucinations. Pt noted to have seizure 2/8 and neurology consulted. Intubated and sedated from 2/10-2/23 (13 days).  MR Brain 2/7 remote infarcts basal ganglia and corona radiata bilaterally. Remote infarct at the junction of the R thalamus and posterior limb R internal capsule. CXR 2/21 no acute cardiopulmonary abnormality. No previous h/o SLP intervention  noted in chart. No Data Recorded Assessment / Plan / Recommendation CHL IP CLINICAL IMPRESSIONS 10/31/2015 Therapy Diagnosis Mild-mod pharyngeal phase dysphagia;Mild-mod oral phase dysphagia Clinical Impression Pt presents with a mild-mod dysphagia with deconditioning and fluctuating MS remaining the primary barriers to progression to PO diet.  Pt maintained neck in flexion during study, with assist needed to elevate head to accept POs and to remain alert.  Nectar-thick liquids were immediately aspirated, eliciting a cough response; honey thick liquids were swallowed safely with adequate transition through pharynx and no penetration nor aspiration.  Purees required extra time for oral preparation; swallow was mildly delayed but material passed through pharynx and into esophagus without deficit.  Recommend initiating a dysphagia 1 diet with honey-thick liquids; meds crushed in puree.  Removal of NG should be considered.  Diet may be advanced at bedside without repeating instrumental study due to adequate sensation of small amounts of aspiration.   Impact on safety and function Moderate aspiration risk   CHL IP TREATMENT RECOMMENDATION 10/31/2015 Treatment Recommendations Therapy as outlined in treatment plan below   Prognosis 10/26/2015 Prognosis for Safe Diet Advancement Fair Barriers to Reach Goals Severity of deficits;Behavior Barriers/Prognosis Comment -- CHL IP DIET RECOMMENDATION 10/31/2015 SLP Diet Recommendations Dysphagia 1 (Puree) solids;Honey thick liquids Liquid Administration via Spoon;Cup Medication Administration Crushed with puree Compensations Minimize environmental distractions;Small sips/bites Postural Changes Seated upright at 90 degrees   CHL IP OTHER RECOMMENDATIONS 10/31/2015 Recommended Consults -- Oral Care Recommendations Oral care BID Other Recommendations Order thickener from pharmacy   CHL IP FOLLOW UP RECOMMENDATIONS 10/31/2015 Follow up Recommendations Skilled Nursing facility   [facility] IP FREQUENCY  AND DURATION 10/31/2015 Speech Therapy Frequency (ACUTE ONLY) min 2x/week Treatment Duration 2 weeks      CHL IP ORAL PHASE 10/31/2015 Oral Phase Impaired Oral - Pudding Teaspoon -- Oral - Pudding Cup -- Oral - Honey Teaspoon -- Oral - Honey Cup -- Oral - Nectar Teaspoon -- Oral - Nectar Cup -- Oral - Nectar Straw -- Oral - Thin Teaspoon -- Oral - Thin Cup -- Oral - Thin Straw -- Oral - Puree Weak lingual manipulation Oral - Mech Soft -- Oral - Regular -- Oral - Multi-Consistency -- Oral - Pill -- Oral Phase - Comment --  CHL IP PHARYNGEAL PHASE 10/31/2015 Pharyngeal Phase Impaired Pharyngeal- Pudding Teaspoon -- Pharyngeal -- Pharyngeal- Pudding Cup -- Pharyngeal -- Pharyngeal- Honey Teaspoon Delayed swallow initiation-vallecula;Pharyngeal residue - pyriform Pharyngeal -- Pharyngeal- Honey Cup Delayed swallow initiation-vallecula;Pharyngeal residue - pyriform Pharyngeal -- Pharyngeal- Nectar Teaspoon -- Pharyngeal -- Pharyngeal- Nectar Cup Delayed swallow initiation-vallecula;Reduced airway/laryngeal closure;Penetration/Aspiration during swallow;Trace aspiration Pharyngeal Material enters airway, passes BELOW cords and not ejected out despite cough attempt  by patient Pharyngeal- Nectar Straw -- Pharyngeal -- Pharyngeal- Thin Teaspoon -- Pharyngeal -- Pharyngeal- Thin Cup -- Pharyngeal -- Pharyngeal- Thin Straw -- Pharyngeal -- Pharyngeal- Puree -- Pharyngeal -- Pharyngeal- Mechanical Soft -- Pharyngeal -- Pharyngeal- Regular -- Pharyngeal -- Pharyngeal- Multi-consistency -- Pharyngeal -- Pharyngeal- Pill -- Pharyngeal -- Pharyngeal Comment --  CHL IP CERVICAL ESOPHAGEAL PHASE 10/31/2015 Cervical Esophageal Phase (No Data) Pudding Teaspoon -- Pudding Cup -- Honey Teaspoon -- Honey Cup -- Nectar Teaspoon -- Nectar Cup -- Nectar Straw -- Thin Teaspoon -- Thin Cup -- Thin Straw -- Puree -- Mechanical Soft -- Regular -- Multi-consistency -- Pill -- Cervical Esophageal Comment -- No flowsheet data found. Juan Quam Laurice  10/31/2015, 1:37 PM               Assessment and plan:   TAZHANE DIETRICH is an 82 y.o. female patient who had status epilepticus and was intubated in ICU previously as described in my prior notes. She is been recovering well from the encephalopathy with continued weaning off of her seizure medications. No further seizure like episodes clinically. Recommend to further reduce Keppra dose to 5 mg twice a day, discontinue Topamax. Keppra is the only antiepileptic medication at this time. Recommend to continue this dose of Keppra at discharge and to follow up with neurology as outpatient. If she remains stable over the next 4-6 weeks without any further seizures, then may start gradually weaning her off of the [place] as well. The etiology for seizures is unknown, no definite intracranial pathology noted as a contributing factor for seizures although restricted diffusion changes in the left insular cortex and thalamus were seen which are believed to be as a result of the status epilepticus. Recommend follow-up brain MRI in 4-6 weeks to monitor for resolution of this changes seen on the prior MRI brain. Follow-up EEG recommended as well with outpatient neurology.   We will plan to repeat a EEG study tomorrow morning prior to discharge to monitor for resolution of the abnormal discharges noted on prior EEGs.  She has evidence of Parkinson's disease with hypophonia, motor rigidity intermittent resting tremor. Recommend starting amantadine 100 g twice a day after breakfast and lunch.   To help with the severe neck spasms with cervical dystonia, recommend baclofen family grandson morning and 10 mg at bedtime.   She is planned to be discharged to skilled nursing facility tomorrow if bed is available. Discussed her current neurological assessment and care plan and detail with her son and daughter-in-law at bedside, answered several of their questions. Advised to follow-up with outpatient neurology after discharge.

## 2015-11-01 NOTE — Care Management Note (Signed)
Case Management Note  Patient Details  Name: MALLOREY MULVANEY MRN: [id number] Date of Birth: 12-29-1930  Subjective/Objective:                    Action/Plan: Patient able to eat some and having less lethargy. CM continuing to follow for discharge disposition.   Expected Discharge Date:                  Expected Discharge Plan:  Skilled Nursing Facility  In-House Referral:  Clinical Social Work  Discharge planning Services  CM Consult  Post Acute Care Choice:    Choice offered to:     DME Arranged:    DME Agency:     HH Arranged:    [place] Agency:     Status of Service:  In process, will continue to follow  Medicare Important Message Given:  Yes Date Medicare IM Given:    Medicare IM give by:    Date Additional Medicare IM Given:    Additional Medicare Important Message give by:     If discussed at [place] of Stay Meetings, dates discussed:    Additional Comments:  Pollie Friar, RN 11/01/2015, 3:37 PM

## 2015-11-02 ENCOUNTER — Inpatient Hospital Stay (HOSPITAL_COMMUNITY): Payer: Medicare Other

## 2015-11-02 ENCOUNTER — Inpatient Hospital Stay (HOSPITAL_COMMUNITY)
Admit: 2015-11-02 | Discharge: 2015-11-02 | Disposition: A | Discharge status: Home / Self Care | Payer: Medicare Other | Attending: Neurology | Admitting: Neurology

## 2015-11-02 DIAGNOSIS — G243 Spasmodic torticollis: Secondary | ICD-10-CM | POA: Insufficient documentation

## 2015-11-02 DIAGNOSIS — M62838 Other muscle spasm: Secondary | ICD-10-CM | POA: Insufficient documentation

## 2015-11-02 DIAGNOSIS — G20 Parkinson's disease: Secondary | ICD-10-CM | POA: Insufficient documentation

## 2015-11-02 LAB — GLUCOSE, CAPILLARY
GLUCOSE-CAPILLARY: 113 mg/dL — AB (ref 65–99)
GLUCOSE-CAPILLARY: 87 mg/dL (ref 65–99)
Glucose-Capillary: 105 mg/dL — ABNORMAL HIGH (ref 65–99)
Glucose-Capillary: 105 mg/dL — ABNORMAL HIGH (ref 65–99)
Glucose-Capillary: 112 mg/dL — ABNORMAL HIGH (ref 65–99)
Glucose-Capillary: 123 mg/dL — ABNORMAL HIGH (ref 65–99)

## 2015-11-02 MED ORDER — GADOBENATE DIMEGLUMINE 529 MG/ML IV SOLN
10.0000 mL | Freq: Once | INTRAVENOUS | Status: AC
  Administered 2015-11-02: 9 mL via INTRAVENOUS

## 2015-11-02 MED ORDER — POTASSIUM CHLORIDE CRYS ER 20 MEQ PO TBCR
40.0000 meq | EXTENDED_RELEASE_TABLET | Freq: Once | ORAL | Status: AC
  Administered 2015-11-02: 40 meq via ORAL
  Filled 2015-11-02: qty 2

## 2015-11-02 MED ORDER — LORAZEPAM 2 MG/ML IJ SOLN
0.5000 mg | Freq: Once | INTRAMUSCULAR | Status: AC
  Administered 2015-11-02: 0.5 mg via INTRAVENOUS

## 2015-11-02 NOTE — Progress Notes (Addendum)
[facility] PROGRESS NOTE  Kathryn [name] [id number] DOB: September 15, 1930 DOA: 10/08/2015 PCP: Lilian Coma, MD  Assessment/Plan:  Status epilepticus- resolved,   patient was admitted to the ICU on 10/09/2015 with prolonged status epilepticus, was intubated and sedated with Diprivan,  was followed by neurology underwent continuous EEG monitoring which has been discontinued. Patient's seizures are controlled with multiple anticonvulsant regimen including Vimpat, Keppra, phenobarbital, Dilantin, Topamax.  - Neurology assisting  Metabolic encephalopathy - likely from multiple antiepileptic medications, Neurology has adjusted antiepileptic medications and discontinued phenobarbital. Vimpat and Keppra dose were reduced. - Somnolence resolving  Nutrition-  Addendum: Patient is on dysphagia 1 diet  Hypokalemia-  - Most likely due to poor oral intake - Still low such will provide another kdur dose  Acute respiratory failure in setting of status epilepticus- resolved  Hypertension- blood pressure is better controlled currently, has prn hydralazine, also on B blocker and amlodipine scheduled  DVT prophylaxis- Lovenox  Goals of care - patient has been lethargic without by mouth due to somnolence from multiple antiepileptic medications . Once the medications are adjusted , and patient started on tube feedings . Monitor the patient, if improved and eating well she can be discharged to skilled facility. If no improvement despite feeding and adjusted medications, consider palliative care consultation for goals of care. Called and discussed with patient's son on phone and he agrees with this plan.    Code Status: DO NOT RESUSCITATE Disposition Plan: Skilled nursing facility most likely next a.m. if bed available   Consultants:  Neurology  Pulmonary  Procedures: CULTURES: 2/07 Urine >> diphtheroids 2/07 Blood >>NGTD 2/09 CSF >>NGTD  ANTIBIOTICS: 2/06 Levaquin >>off 2/09 Acyclovir  >> 2/11 2/10 Vanco>>off 2/10 ceftaz>> off 2/11 sputum>> candida  Tubes 2/10 ETT >> 2/23  STUDIES and events:  2/06 Admit 2/08 Noted to have seizure, neuro consulted 2/06 CT head >> atrophy 2/07 MRI brain >> remote basal ganglia and corona radiata infarcts b/l, remote Rt thalamus and posterior limb internal capsule infarcts, global atrophy 2/08 EEG >> lt temporal region focal seizures, lt hemisphere slowing 2/09 LP >> 140 RBC, 2 WBC, protein 38, glucose 51 2/10 Status epilepticus >> to ICU, diprivan coma  2./12 - Sedated on vent, szs overnight  2/13 - afebrile with ongoing c-eeg and diprivan 10/16/15 - c-EEG off. PEr tech - PLEDS +. c-versed gtt started by neuro per RN.  MRI - New signal abnormality in the left thalamus  2/15 EEG - PLEDS 2/17 - EEG - PLEDS improved 2/18 PLEDS resolved 2/23- DNR, DNI, no trach option, extubated successfuly   Antibiotics: None  HPI/Subjective: 82 yo female brought to ER due to confusion after falling. She was also having visual hallucinations. She was noted to have seizure on 2/08 and neurology consulted. She was also noted to have fever. She had LP that was unrevealing. She was started on abx for possible UTI, and acyclovir for possible encephalitis. Her seizures persistent, and she was transferred to ICU for diprivan coma and intubation. A shunt was sedated on Diprivan and intubated. EEG performed on 10/17/2015 showed periodic lateralized epileptiform discharges (PLED's). Which resolved on 10/20/2015. Patient was extubated after discussion with family and was made DO NOT RESUSCITATE. She had failed swallow evaluation so was kept nothing by mouth.  Patient has no new complaints no acute issues overnight. Pt more alert  Objective: Filed Vitals:   11/02/15 0519 11/02/15 1348  BP: 182/92 163/86  Pulse: 79 79  Temp: 97.3 F (36.3 C) 98.2 F (  36.8 C)  Resp: 18 18    Intake/Output Summary (Last 24 hours) at 15-Nov-2015 1707 Last data filed  at November 15, 2015 1600  Gross per 24 hour  Intake    370 ml  Output   3750 ml  Net  -3380 ml   Filed Weights   10/29/15 0500 10/30/15 0433 10/31/15 0500  Weight: 52.753 kg (116 lb 4.8 oz) 53.933 kg (118 lb 14.4 oz) 53.978 kg (119 lb)    Exam:   General:  Pt in nad, awake and alert during my visit  Cardiovascular: S1S2 RRR  Respiratory: Clear bilaterally, no wheezes  Abdomen: Soft, nontender  Musculoskeletal: No edema of the lower extremities  Data Reviewed: Basic Metabolic Panel:  Recent Labs Lab 10/27/15 0412 10/29/15 0354 10/30/15 0353 10/31/15 0517 10/31/15 2013  NA 141 141 140  --   --   K 3.3* 2.9* 3.1*  --  3.1*  CL 104 105 106  --   --   CO2 22 25 25   --   --   GLUCOSE 97 102* 305*  --   --   BUN 18 21* 19  --   --   CREATININE 0.46 0.54 0.55  --   --   CALCIUM 10.2 9.5 8.9  --   --   MG 1.7  --   --  1.3*  --   PHOS 2.7  --   --   --   --    Liver Function Tests: No results for input(s): AST, ALT, ALKPHOS, BILITOT, PROT, ALBUMIN in the last 168 hours. CBC:  Recent Labs Lab 10/29/15 0354  WBC 3.6*  HGB 10.5*  HCT 32.1*  MCV 93.3  PLT 266   Cardiac Enzymes: No results for input(s): CKTOTAL, CKMB, CKMBINDEX, TROPONINI in the last 168 hours. BNP (last 3 results)  Recent Labs  10/09/15 0228  BNP 64.5    ProBNP (last 3 results) No results for input(s): PROBNP in the last 8760 hours.  CBG:  Recent Labs Lab 11/15/15 0029 2015-11-15 0407 11-15-2015 0753 11-15-15 1139 November 15, 2015 1627  GLUCAP 87 105* 105* 113* 112*    No results found for this or any previous visit (from the past 240 hour(s)).   Studies: Mr Kizzie Fantasia Contrast  15-Nov-2015  CLINICAL DATA:  Encephalopathy. Seizures. Previous abnormal MRI of the brain. EXAM: MRI HEAD WITHOUT AND WITH CONTRAST TECHNIQUE: Multiplanar, multiecho pulse sequences of the brain and surrounding structures were obtained without and with intravenous contrast. CONTRAST:  1 MULTIHANCE GADOBENATE DIMEGLUMINE 529  MG/ML IV SOLN COMPARISON:  MRI brain 10/16/2015. FINDINGS: T2 changes within the posterior left thalamus have improved. There is no longer restricted diffusion in the posterior left thalamus. Previously noted cortical and subcortical T2 changes over the left temporal and parietal lobe have resolved. The brain is more atrophic. Remote lacunar infarcts of the basal ganglia and centrum semi ovale are otherwise stable. Periventricular white matter changes are similar to the prior exam. White matter changes of the brainstem are stable. Cerebellum is unremarkable. The internal auditory canals are within normal limits bilaterally. Bilateral lens replacements are noted. The globes and orbits are intact. A prominent fluid level is noted in the left sphenoid sinus. Asymmetric anterior left ethmoid sinus disease is present. A small fluid level is present in the left maxillary sinus. Bilateral mastoid effusions are evident, left greater than right. No obstructing nasopharyngeal lesion is present. The postcontrast images demonstrate no pathologic enhancement. IMPRESSION: 1. Interval decrease and T2 signal within the  posterior left thalamus and resolution of previously noted restricted diffusion. 2. Resolution of previously seen cortical and subcortical T2 changes over the left temporal and parietal lobe. 3. The findings are compatible with resolution of previously noted status epilepticus. 4. No other focal lesion to explain the patient's seizures. 5. Atrophy and white matter disease is otherwise noted. Cerebral atrophy is similar to the studies from 2 years ago suggesting there was some diffuse cerebral edema on the most recent study. 6. Diffuse sinus disease. Electronically Signed   By: San Morelle M.D.   On: 11/02/2015 11:16    Scheduled Meds: . amantadine  100 mg Oral BID  . amLODipine  5 mg Oral Daily  . antiseptic oral rinse  7 mL Mouth Rinse QID  . baclofen  10 mg Oral QHS  . baclofen  5 mg Oral QPC  breakfast  . chlorhexidine gluconate  15 mL Mouth Rinse BID  . enoxaparin (LOVENOX) injection  40 mg Subcutaneous Q24H  . insulin aspart  0-15 Units Subcutaneous TID WC  . insulin aspart  0-5 Units Subcutaneous QHS  . levETIRAcetam  500 mg Intravenous Q12H  . losartan  100 mg Oral Daily  . multivitamin with minerals  1 tablet Oral Daily  . nystatin-triamcinolone  1 application Topical BID  . pantoprazole sodium  40 mg Oral QHS  . polyethylene glycol  17 g Oral Daily  . propranolol  40 mg Oral TID  . [place]   Oral Once  . simvastatin  40 mg Oral Daily  . sodium chloride flush  10-40 mL Intracatheter Q12H  . sodium chloride flush  3 mL Intravenous Q12H  . vitamin B-12  1,000 mcg Oral Daily  . vitamin C  500 mg Oral Daily  . vitamin e  200 Units Oral Daily   Continuous Infusions: . dextrose 5 % and 0.45% NaCl 50 mL/hr at 10/30/15 0023    Principal Problem:   Acute encephalopathy Active Problems:   Spinal stenosis, lumbar region, with neurogenic claudication   Generalized anxiety disorder   GERD (gastroesophageal reflux disease)   Essential hypertension, benign   Recurrent falls   History of stroke   Polypharmacy   Generalized ischemic cerebrovascular disease   Chronic abdominal pain   Status epilepticus ([place])   Encephalopathy   Endotracheal tube present   Acute respiratory failure with hypoxia (H[facility])   Acute respiratory failure (H[facility])   Seizure (HCC)   Seizures ([place])   General weakness   Parkinson's disease ([place])   Cervical dystonia   Muscle spasms of neck  Time spent: 25 min  Velvet Bathe  [facility] Pager [phone]. If 7PM-7AM, please contact night-coverage at [email or web address], password [facility] 11/02/2015, 5:07 PM  LOS: 24 days

## 2015-11-02 NOTE — Progress Notes (Signed)
Pt to MRI

## 2015-11-02 NOTE — Progress Notes (Signed)
Pt still in MRI, will administer medications when pt back on floor.

## 2015-11-02 NOTE — Progress Notes (Signed)
Interval History:                                                                                                                      KIAJA DENVER is an 82 y.o. female patient  Admitted with status epilepticus, resolved, with improved mental status.  She is more alert today. Appears to be less distress. She is able to move her limbs better today. She  Was started on baclofen and amantadine yesterday and Keppra dose reduced to 500 twice a day.  MRI of the brain showed near resolution of the abnormal signal changes seen in the left insular cortex and left thalamus compared to the prior MRI. Follow-up EEG today was also unremarkable with resolution of the previosly known left sided abnormal discharges.   Past Medical History: Past Medical History  Diagnosis Date  . Depression   . Hypertension   . High cholesterol   . Rectal prolapse   . Hx of adenomatous colonic polyps   . Internal hemorrhoids   . Hypothyroidism   . Fecal incontinence   . TIA (transient ischemic attack)   . History of frequent urinary tract infections     recent  . Migraine   . Fatty liver 03/20/13  . Generalized ischemic cerebrovascular disease 10/09/2015  . Stroke [facility]) Sept 1, 2010; Sept 12, 2011    Past Surgical History  Procedure Laterality Date  . Colon surgery  2010, for prolapsed organs after hystecrtomy surgery  . Appendectomy  2008  . Cholecystectomy    . Lumbar laminectomy/decompression microdiscectomy N/A 01/07/2013    Procedure: LUMBAR LAMINECTOMY CENTRAL DECOMPRESSION L4-L5, BILATERAL FORAMENOTOMY L4,L5    ;  Surgeon: Tobi Bastos, MD;  Location: [facility];  Service: Orthopedics;  Laterality: N/A;  . Back surgery    . Hemorrhoid surgery  09/2008    Archie Endo 12/31/2010  . Abdominal hysterectomy  05/2007    Archie Endo 01/02/2011    Family History: Family History  Problem Relation Age of Onset  . Stroke Father   . Migraines Father   . CVA Father   . Heart attack Father   . Hypertension Maternal Aunt     x3   . Colon cancer Neg Hx   . CVA Mother     Social History:   reports that she has never smoked. She has never used smokeless tobacco. She reports that she does not drink alcohol or use illicit drugs.  Allergies:  Allergies  Allergen Reactions  . Augmentin [Amoxicillin-Pot Clavulanate] Swelling and Diarrhea    Throat   . Citalopram Swelling    Eyes ears and throat swelling Throat and eyes   . Codeine Anaphylaxis and Shortness Of Breath  . Fish-Derived Products Anaphylaxis  . Hyoscyamine Anaphylaxis and Swelling  . Ibuprofen Swelling    Throat and eyes   . Latex Swelling and Rash    Swelling to troat  . Lipitor [Atorvastatin] Swelling    Muscle aches throat  . Septra [Bactrim] Anaphylaxis  . Shellfish  Allergy Anaphylaxis  . Miconazole Rash  . Keflet [Cephalexin] Diarrhea    Upset stomach  . Meloxicam Diarrhea and Nausea And Vomiting  . Sulfa Antibiotics     Rash and also throat was tight  . Verapamil     Tachycardia and flushing  . Vicodin [Hydrocodone-Acetaminophen] Other (See Comments)    stroke  . Zoloft [Sertraline Hcl] Swelling    Red spots all over face, swelling of tongue and legs.   . Ciprofloxacin Nausea And Vomiting    Other reaction(s): Abdominal Pain  . Depakene [Valproate Sodium] Hives    All over body   . Isoptin Sr [Verapamil Hcl Er] Cough  . Lisinopril Cough    Fatigue and cough  . Telmisartan-Hctz Cough     Medications:                                                                                                                         Current facility-administered medications:  .  Place/Maintain arterial line, , , Until Discontinued **AND** 0.9 %  sodium chloride infusion, , Intra-arterial, PRN, Javier Glazier, MD, Last Rate: 10 mL/hr at 10/23/15 2000 .  acetaminophen (TYLENOL) solution 650 mg, 650 mg, Oral, Q6H PRN, Chesley Mires, MD, 650 mg at 11/02/15 1752 .  acetaminophen (TYLENOL) suppository 650 mg, 650 mg, Rectal, Q6H PRN, Gardiner Barefoot, NP, 650 mg at 10/30/15 [id number] .  amantadine (SYMMETREL) capsule 100 mg, 100 mg, Oral, BID, Charvi Gammage Fuller Mandril, MD, 100 mg at 11/02/15 1236 .  amLODipine (NORVASC) tablet 5 mg, 5 mg, Oral, Daily, Oswald Hillock, MD, 5 mg at 11/02/15 1235 .  antiseptic oral rinse solution (CORINZ), 7 mL, Mouth Rinse, QID, Oswald Hillock, MD, 7 mL at 11/02/15 1400 .  baclofen (LIORESAL) tablet 10 mg, 10 mg, Oral, QHS, Elandra Powell Fuller Mandril, MD, 10 mg at 11/01/15 2158 .  baclofen (LIORESAL) tablet 5 mg, 5 mg, Oral, QPC breakfast, Leevon Upperman Fuller Mandril, MD .  chlorhexidine gluconate (PERIDEX) 0.12 % solution 15 mL, 15 mL, Mouth Rinse, BID, Chesley Mires, MD, 15 mL at 11/02/15 0800 .  dextrose 5 %-0.45 % sodium chloride infusion, , Intravenous, Continuous, Oswald Hillock, MD, Last Rate: 50 mL/hr at 10/30/15 0023 .  enoxaparin (LOVENOX) injection 40 mg, 40 mg, Subcutaneous, Q24H, Drema Dallas, DO, 40 mg at 11/02/15 1307 .  hydrALAZINE (APRESOLINE) injection 5 mg, 5 mg, Intravenous, Q2H PRN, Ivor Costa, MD, 5 mg at 10/31/15 0218 .  insulin aspart (novoLOG) injection 0-15 Units, 0-15 Units, Subcutaneous, TID WC, Velvet Bathe, MD, 2 Units at 11/01/15 1200 .  insulin aspart (novoLOG) injection 0-5 Units, 0-5 Units, Subcutaneous, QHS, Velvet Bathe, MD, 0 Units at 10/31/15 2200 .  levETIRAcetam (KEPPRA) 500 mg in sodium chloride 0.9 % 100 mL IVPB, 500 mg, Intravenous, Q12H, Rylen Swindler Fuller Mandril, MD, 500 mg at 11/02/15 1312 .  LORazepam (ATIVAN) injection 2 mg, 2 mg, Intravenous, Once PRN, Velvet Bathe, MD .  losartan (COZAAR) tablet 100 mg, 100 mg, Oral, Daily, Oswald Hillock, MD, 100 mg at 11/02/15 1236 .  multivitamin with minerals tablet 1 tablet, 1 tablet, Oral, Daily, Velvet Bathe, MD, 1 tablet at 11/01/15 1702 .  nystatin-triamcinolone (MYCOLOG II) cream 1 application, 1 application, Topical, BID, Lauren D Bajbus, RPH, 1 application at [id number] .  [DISCONTINUED] ondansetron (ZOFRAN)  tablet 4 mg, 4 mg, Oral, Q6H PRN **OR** ondansetron (ZOFRAN) injection 4 mg, 4 mg, Intravenous, Q6H PRN, Ivor Costa, MD .  pantoprazole sodium (PROTONIX) 40 mg/20 mL oral suspension 40 mg, 40 mg, Oral, QHS, Skeet Simmer, RPH, 40 mg at 11/01/15 2156 .  polyethylene glycol (MIRALAX / GLYCOLAX) packet 17 g, 17 g, Oral, Daily, Raylene Miyamoto, MD, 17 g at 11/02/15 1234 .  propranolol (INDERAL) tablet 40 mg, 40 mg, Oral, TID, Oswald Hillock, MD, 40 mg at 11/02/15 1549 .  RESOURCE THICKENUP CLEAR, , Oral, Once, Raylene Miyamoto, MD .  sennos[facility]) 8.8 MG/5ML syrup 5 mL, 5 mL, Oral, BID PRN, Skeet Simmer, RPH .  simvastatin (ZOCOR) tablet 40 mg, 40 mg, Oral, Daily, Skeet Simmer, RPH, 40 mg at 11/02/15 1328 .  sodium chloride flush (NS) 0.9 % injection 10-40 mL, 10-40 mL, Intracatheter, Q12H, Raylene Miyamoto, MD, 10 mL at 11/01/15 2159 .  sodium chloride flush (NS) 0.9 % injection 10-40 mL, 10-40 mL, Intracatheter, PRN, Raylene Miyamoto, MD, 20 mL at 11/02/15 2009 .  sodium chloride flush (NS) 0.9 % injection 3 mL, 3 mL, Intravenous, Q12H, Ivor Costa, MD, 3 mL at 11/02/15 1310 .  vitamin B-12 (CYANOCOBALAMIN) tablet 1,000 mcg, 1,000 mcg, Oral, Daily, Skeet Simmer, RPH, 1,000 mcg at 11/02/15 1236 .  vitamin C (ASCORBIC ACID) tablet 500 mg, 500 mg, Oral, Daily, Skeet Simmer, RPH, 500 mg at 11/02/15 1237 .  vitamin e oil OIL 200 Units, 200 Units, Oral, Daily, Skeet Simmer, RPH, 200 Units at 11/02/15 1311   Neurologic Examination:                                                                                                      Blood pressure 168/74, pulse 79, temperature 97.9 F (36.6 C), temperature source Oral, resp. rate 20, height 5\' 3"  (1.6 m), weight 53.978 kg (119 lb), SpO2 99 %.  she is alert, follow simple commands without difficulty. Hypophonic speech no dysarthria or aphasia.  Cranial nerve II visual grossly intact.  Motor rigidity is improved with amantadine.  Able to  elevate bilateral upper extremities antigravity with , no drift.     Lab Results: Basic Metabolic Panel:  Recent Labs Lab 10/27/15 0412 10/29/15 0354 10/30/15 0353 10/31/15 0517 10/31/15 2013  NA 141 141 140  --   --   K 3.3* 2.9* 3.1*  --  3.1*  CL 104 105 106  --   --   CO2 22 25 25   --   --   GLUCOSE 97 102* 305*  --   --   BUN 18 21* 19  --   --  CREATININE 0.46 0.54 0.55  --   --   CALCIUM 10.2 9.5 8.9  --   --   MG 1.7  --   --  1.3*  --   PHOS 2.7  --   --   --   --     Liver Function Tests: No results for input(s): AST, ALT, ALKPHOS, BILITOT, PROT, ALBUMIN in the last 168 hours. No results for input(s): LIPASE, AMYLASE in the last 168 hours. No results for input(s): AMMONIA in the last 168 hours.  CBC:  Recent Labs Lab 10/29/15 0354  WBC 3.6*  HGB 10.5*  HCT 32.1*  MCV 93.3  PLT 266    Cardiac Enzymes: No results for input(s): CKTOTAL, CKMB, CKMBINDEX, TROPONINI in the last 168 hours.  Lipid Panel: No results for input(s): CHOL, TRIG, HDL, CHOLHDL, VLDL, LDLCALC in the last 168 hours.  CBG:  Recent Labs Lab 11/02/15 0029 11/02/15 0407 11/02/15 0753 11/02/15 1139 11/02/15 1627  GLUCAP 87 105* 105* 113* 112*    Microbiology: Results for orders placed or performed during the hospital encounter of 10/08/15  Urine culture     Status: None   Collection Time: 10/09/15  1:43 AM  Result Value Ref Range Status   Specimen Description URINE, CATHETERIZED  Final   Special Requests NONE  Final   Culture   Final    >=100,000 COLONIES/mL DIPHTHEROIDS(CORYNEBACTERIUM SPECIES) Standardized susceptibility testing for this organism is not available.    Report Status 10/10/2015 FINAL  Final  Culture, blood (Routine X 2) w Reflex to ID Panel     Status: None   Collection Time: 10/09/15  5:30 AM  Result Value Ref Range Status   Specimen Description BLOOD LEFT HAND  Final   Special Requests IN PEDIATRIC BOTTLE 1CC  Final   Culture NO GROWTH 5 DAYS  Final    Report Status 10/14/2015 FINAL  Final  Culture, blood (Routine X 2) w Reflex to ID Panel     Status: None   Collection Time: 10/09/15  5:38 AM  Result Value Ref Range Status   Specimen Description BLOOD LEFT ARM  Final   Special Requests IN PEDIATRIC BOTTLE Amelia  Final   Culture NO GROWTH 5 DAYS  Final   Report Status 10/14/2015 FINAL  Final  MRSA PCR Screening     Status: None   Collection Time: 10/10/15 11:39 PM  Result Value Ref Range Status   MRSA by PCR NEGATIVE NEGATIVE Final    Comment:        The GeneXpert MRSA Assay (FDA approved for NASAL specimens only), is one component of a comprehensive MRSA colonization surveillance program. It is not intended to diagnose MRSA infection nor to guide or monitor treatment for MRSA infections.   CSF culture     Status: None   Collection Time: 10/11/15  1:33 PM  Result Value Ref Range Status   Specimen Description CSF  Final   Special Requests CSF NO 2  Final   Gram Stain   Final    CYTOSPIN SMEAR WBC PRESENT, PREDOMINANTLY PMN NO ORGANISMS SEEN    Culture NO GROWTH 3 DAYS  Final   Report Status 10/14/2015 FINAL  Final  Culture, respiratory (NON-Expectorated)     Status: None   Collection Time: 10/13/15  9:30 AM  Result Value Ref Range Status   Specimen Description ENDOTRACHEAL  Final   Special Requests NONE  Final   Gram Stain   Final    FEW WBC PRESENT,BOTH  PMN AND MONONUCLEAR RARE SQUAMOUS EPITHELIAL CELLS PRESENT NO ORGANISMS SEEN Performed at [facility]    Culture   Final    FEW YEAST CONSISTENT WITH CANDIDA SPECIES Performed at [facility]    Report Status 10/16/2015 FINAL  Final    Imaging: Dg Abd 1 View  10/30/2015  CLINICAL DATA:  Feeding tube placement EXAM: ABDOMEN - 1 VIEW COMPARISON:  Study obtained earlier in the day FLUOROSCOPY TIME:  0 minutes 1 second; 1 acquired image FINDINGS: Feeding tube tip is at the junction of the duodenum and jejunum. Contrast flows freely through the tube  into the proximal jejunum. The visualized bowel gas pattern is normal. IMPRESSION: Feeding tube tip is just beyond the fourth portion the duodenum in the proximal most aspect of the jejunum. Bowel gas pattern unremarkable. Electronically Signed   By: Lowella Grip III M.D.   On: 10/30/2015 14:21   Mr Jeri Cos [id number] Contrast  11/02/2015  CLINICAL DATA:  Encephalopathy. Seizures. Previous abnormal MRI of the brain. EXAM: MRI HEAD WITHOUT AND WITH CONTRAST TECHNIQUE: Multiplanar, multiecho pulse sequences of the brain and surrounding structures were obtained without and with intravenous contrast. CONTRAST:  1 MULTIHANCE GADOBENATE DIMEGLUMINE 529 MG/ML IV SOLN COMPARISON:  MRI brain 10/16/2015. FINDINGS: T2 changes within the posterior left thalamus have improved. There is no longer restricted diffusion in the posterior left thalamus. Previously noted cortical and subcortical T2 changes over the left temporal and parietal lobe have resolved. The brain is more atrophic. Remote lacunar infarcts of the basal ganglia and centrum semi ovale are otherwise stable. Periventricular white matter changes are similar to the prior exam. White matter changes of the brainstem are stable. Cerebellum is unremarkable. The internal auditory canals are within normal limits bilaterally. Bilateral lens replacements are noted. The globes and orbits are intact. A prominent fluid level is noted in the left sphenoid sinus. Asymmetric anterior left ethmoid sinus disease is present. A small fluid level is present in the left maxillary sinus. Bilateral mastoid effusions are evident, left greater than right. No obstructing nasopharyngeal lesion is present. The postcontrast images demonstrate no pathologic enhancement. IMPRESSION: 1. Interval decrease and T2 signal within the posterior left thalamus and resolution of previously noted restricted diffusion. 2. Resolution of previously seen cortical and subcortical T2 changes over the left temporal  and parietal lobe. 3. The findings are compatible with resolution of previously noted status epilepticus. 4. No other focal lesion to explain the patient's seizures. 5. Atrophy and white matter disease is otherwise noted. Cerebral atrophy is similar to the studies from 2 years ago suggesting there was some diffuse cerebral edema on the most recent study. 6. Diffuse sinus disease. Electronically Signed   By: San Morelle M.D.   On: 11/02/2015 11:16   Dg Abd Portable 1v  10/30/2015  CLINICAL DATA:  Feeding tube placement EXAM: PORTABLE ABDOMEN - 1 VIEW COMPARISON:  10/29/2015 FINDINGS: There is a feeding tube with the tube coiled within the stomach and the metallic tip located along the fundus of the stomach. There is no bowel dilatation to suggest obstruction. There is no evidence of pneumoperitoneum, portal venous gas or pneumatosis. There are no pathologic calcifications along the expected course of the ureters. The osseous structures are unremarkable. IMPRESSION: Feeding tube with the tube coiled within the stomach and the metallic tip located along the fundus of the stomach. Electronically Signed   By: Kathreen Devoid   On: 10/30/2015 12:40   Dg Loyce Dys Tube Plc  W/fl-no Rad  10/30/2015  CLINICAL DATA:  NASO G TUBE PLACEMENT WITH FLUORO Fluoroscopy was utilized by the requesting physician.  No radiographic interpretation.   Dg Swallowing Func-speech Pathology  10/31/2015  Objective Swallowing Evaluation: Type of Study: MBS-Modified Barium Swallow Study Patient Details Name: JANIE GILKERSON MRN: [id number] Date of Birth: 13-Nov-1930 Today's Date: 10/31/2015 Time: SLP Start Time (ACUTE ONLY): 1240-SLP Stop Time (ACUTE ONLY): 1300 SLP Time Calculation (min) (ACUTE ONLY): 20 min Past Medical History: Past Medical History Diagnosis Date . Depression  . Hypertension  . High cholesterol  . Rectal prolapse  . Hx of adenomatous colonic polyps  . Internal hemorrhoids  . Hypothyroidism  . Fecal incontinence  . TIA  (transient ischemic attack)  . History of frequent urinary tract infections    recent . Migraine  . Fatty liver 03/20/13 . Generalized ischemic cerebrovascular disease 10/09/2015 . Stroke [facility]) Sept 1, 2010; Sept 12, 2011 Past Surgical History: Past Surgical History Procedure Laterality Date . Colon surgery  2010, for prolapsed organs after hystecrtomy surgery . Appendectomy  2008 . Cholecystectomy   . Lumbar laminectomy/decompression microdiscectomy N/A 01/07/2013   Procedure: LUMBAR LAMINECTOMY CENTRAL DECOMPRESSION L4-L5, BILATERAL FORAMENOTOMY L4,L5    ;  Surgeon: Tobi Bastos, MD;  Location: [facility];  Service: Orthopedics;  Laterality: N/A; . Back surgery   . Hemorrhoid surgery  09/2008   Archie Endo 12/31/2010 . Abdominal hysterectomy  05/2007   /notes 01/02/2011 HPI: 82 y.o. female with h/o HTN, HLD, GERD, hypothyroidism, depression, anxiety, TIA, CVA x2 (05/2009, 05/2010), recurrent UTI, chronic back pain and chronic abdominal pain, who presented to ED 2/6 with AMS and visual hallucinations. Pt noted to have seizure 2/8 and neurology consulted. Intubated and sedated from 2/10-2/23 (13 days).  MR Brain 2/7 remote infarcts basal ganglia and corona radiata bilaterally. Remote infarct at the junction of the R thalamus and posterior limb R internal capsule. CXR 2/21 no acute cardiopulmonary abnormality. No previous h/o SLP intervention noted in chart. No Data Recorded Assessment / Plan / Recommendation CHL IP CLINICAL IMPRESSIONS 10/31/2015 Therapy Diagnosis Mild-mod pharyngeal phase dysphagia;Mild-mod oral phase dysphagia Clinical Impression Pt presents with a mild-mod dysphagia with deconditioning and fluctuating MS remaining the primary barriers to progression to PO diet.  Pt maintained neck in flexion during study, with assist needed to elevate head to accept POs and to remain alert.  Nectar-thick liquids were immediately aspirated, eliciting a cough response; honey thick liquids were swallowed safely with adequate transition  through pharynx and no penetration nor aspiration.  Purees required extra time for oral preparation; swallow was mildly delayed but material passed through pharynx and into esophagus without deficit.  Recommend initiating a dysphagia 1 diet with honey-thick liquids; meds crushed in puree.  Removal of NG should be considered.  Diet may be advanced at bedside without repeating instrumental study due to adequate sensation of small amounts of aspiration.   Impact on safety and function Moderate aspiration risk   CHL IP TREATMENT RECOMMENDATION 10/31/2015 Treatment Recommendations Therapy as outlined in treatment plan below   Prognosis 10/26/2015 Prognosis for Safe Diet Advancement Fair Barriers to Reach Goals Severity of deficits;Behavior Barriers/Prognosis Comment -- CHL IP DIET RECOMMENDATION 10/31/2015 SLP Diet Recommendations Dysphagia 1 (Puree) solids;Honey thick liquids Liquid Administration via Spoon;Cup Medication Administration Crushed with puree Compensations Minimize environmental distractions;Small sips/bites Postural Changes Seated upright at 90 degrees   CHL IP OTHER RECOMMENDATIONS 10/31/2015 Recommended Consults -- Oral Care Recommendations Oral care BID Other Recommendations Order thickener from pharmacy  CHL IP FOLLOW UP RECOMMENDATIONS 10/31/2015 Follow up Recommendations Skilled Nursing facility   [facility] IP FREQUENCY AND DURATION 10/31/2015 Speech Therapy Frequency (ACUTE ONLY) min 2x/week Treatment Duration 2 weeks      CHL IP ORAL PHASE 10/31/2015 Oral Phase Impaired Oral - Pudding Teaspoon -- Oral - Pudding Cup -- Oral - Honey Teaspoon -- Oral - Honey Cup -- Oral - Nectar Teaspoon -- Oral - Nectar Cup -- Oral - Nectar Straw -- Oral - Thin Teaspoon -- Oral - Thin Cup -- Oral - Thin Straw -- Oral - Puree Weak lingual manipulation Oral - Mech Soft -- Oral - Regular -- Oral - Multi-Consistency -- Oral - Pill -- Oral Phase - Comment --  CHL IP PHARYNGEAL PHASE 10/31/2015 Pharyngeal Phase Impaired Pharyngeal- Pudding  Teaspoon -- Pharyngeal -- Pharyngeal- Pudding Cup -- Pharyngeal -- Pharyngeal- Honey Teaspoon Delayed swallow initiation-vallecula;Pharyngeal residue - pyriform Pharyngeal -- Pharyngeal- Honey Cup Delayed swallow initiation-vallecula;Pharyngeal residue - pyriform Pharyngeal -- Pharyngeal- Nectar Teaspoon -- Pharyngeal -- Pharyngeal- Nectar Cup Delayed swallow initiation-vallecula;Reduced airway/laryngeal closure;Penetration/Aspiration during swallow;Trace aspiration Pharyngeal Material enters airway, passes BELOW cords and not ejected out despite cough attempt by patient Pharyngeal- Nectar Straw -- Pharyngeal -- Pharyngeal- Thin Teaspoon -- Pharyngeal -- Pharyngeal- Thin Cup -- Pharyngeal -- Pharyngeal- Thin Straw -- Pharyngeal -- Pharyngeal- Puree -- Pharyngeal -- Pharyngeal- Mechanical Soft -- Pharyngeal -- Pharyngeal- Regular -- Pharyngeal -- Pharyngeal- Multi-consistency -- Pharyngeal -- Pharyngeal- Pill -- Pharyngeal -- Pharyngeal Comment --  CHL IP CERVICAL ESOPHAGEAL PHASE 10/31/2015 Cervical Esophageal Phase (No Data) Pudding Teaspoon -- Pudding Cup -- Honey Teaspoon -- Honey Cup -- Nectar Teaspoon -- Nectar Cup -- Nectar Straw -- Thin Teaspoon -- Thin Cup -- Thin Straw -- Puree -- Mechanical Soft -- Regular -- Multi-consistency -- Pill -- Cervical Esophageal Comment -- No flowsheet data found. Juan Quam Laurice 10/31/2015, 1:37 PM               Assessment and plan:   LAJOYCE BONITO is an 82 y.o. female patient  Admitted with status epilepticus, resolved, with improved mental status.  She is more alert today. Appears to be in less distress. She is able to move her limbs better today. She  Was started on baclofen and amantadine yesterday and Keppra dose reduced to 500 twice a day.  MRI of the brain showed near resolution of the abnormal signal changes seen in the left insular cortex and left thalamus compared to the prior MRI. Follow-up EEG today was also unremarkable with resolution of the seriously  known left sided abnormal discharges.  Continue Keppra 500 twice a day for seizure prophylaxis at this time, amantadine 100 mg twice a day after breakfast and lunch for Parkinson's disease and baclofen 5 g in the morning and 10 mg at bedtime for the severe neck spasms.  No further change to her neurological medications. Awaiting placement in a skilled nursing facility.   Will sign off. Recommend to follow up with outpatient neurology after discharge.

## 2015-11-02 NOTE — Progress Notes (Addendum)
Physical Therapy Treatment Patient Details Name: Kathryn [name] MRN: [id number] DOB: 08-21-1931 Today's Date: 11/02/2015    History of Present Illness pt initially presented with Confusion and Hallucinations.  pt found to be encephalopathic and with UTI.  On 10/10/15 pt had seizure activity and contineud seizure activity that required sedation and intubation from 2/10 - 2/23. Patient has remained lethargic with multiple changes in seizure medicaitons. pt with hx of CVA, Depression, HTN, Chronic Back Pain, and Anxiety.      PT Comments    Patient new to this therapist with chart thoroughly reviewed. Noted per PT evaluation 10/09/15, patient lived alone PTA and walked independently. On 10/09/15 she walked 40 ft with RW and moderate assistance of 2 people. Since her seizures and prolonged intubation, pt has demonstrated severe weakness (even as somnolence has improved). Today pt was alert, awake and followed one step commands with delay with all extremities. She is profoundly weak (grossly 1+ to 2- Left extremities and 2+ Rt extremities).   Unclear etiology of extensive weakness. Noted possible Palliative Care Consult and would also recommend they be consulted. PT will follow with goals revised to reflect profound functional and strength changes.     Follow Up Recommendations  SNF     Equipment Recommendations  None recommended by PT    Recommendations for Other Services Other (comment) (Palliative consult)     Precautions / Restrictions Precautions Precautions: Fall    Mobility  Bed Mobility Overal bed mobility: Needs Assistance;+2 for physical assistance Bed Mobility: Rolling Rolling: Total assist;+2 for physical assistance         General bed mobility comments: rolled Rt and left with total assist (pt only able to assist with RLE flexion  Transfers                    Ambulation/Gait                 Stairs            Wheelchair Mobility    Modified  Rankin (Stroke Patients Only)       Balance                                    Cognition Arousal/Alertness: Awake/alert Behavior During Therapy: Flat affect Overall Cognitive Status: Impaired/Different from baseline Area of Impairment: Orientation;Following commands;Awareness Orientation Level: Disoriented to;Place;Time;Situation Current Attention Level: Sustained   Following Commands: Follows one step commands with increased time       General Comments: Alert; few verbalizations with strength of voice varying    Exercises General Exercises - Upper Extremity Shoulder Flexion: AAROM;Both;Other reps (comment) (x3) Shoulder Extension: AAROM;Both;Strengthening (x3) Elbow Flexion: AAROM;Strengthening;Both (x3) Elbow Extension: AROM;Strengthening;Both (x3) Digit Composite Flexion: AROM;Strengthening;Both (x3) General Exercises - Lower Extremity Ankle Circles/Pumps: AAROM;Both;Other reps (comment) (prolonged stretch; +clonus) Short Arc Quad: AAROM;Both;Other reps (comment) (x2; able to assist with lowering leg eccentrically) Heel Slides: AAROM;Both;Other reps (comment) (x3; RLE stronger) Hip ABduction/ADduction: AROM;Both (in hooklying)    General Comments        Pertinent Vitals/Pain Pain Assessment: Faces Faces Pain Scale: Hurts little more Pain Location: neck Pain Descriptors / Indicators: Grimacing Pain Intervention(s): Repositioned    Home Living                      Prior Function            PT Goals (current  goals can now be found in the care plan section) Acute Rehab PT Goals PT Goal Formulation: Patient unable to participate in goal setting Time For Goal Achievement: 11/16/15 Potential to Achieve Goals: Fair Progress towards PT goals: Not progressing toward goals - comment;Goals downgraded-see care plan    Frequency  Min 2X/week    PT Plan Current plan remains appropriate    Co-evaluation             End of Session    Activity Tolerance: Patient limited by fatigue Patient left: in bed;with nursing/sitter in room     Time: [id number] PT Time Calculation (min) (ACUTE ONLY): 29 min  Charges:  $Therapeutic Exercise: 8-22 mins $Therapeutic Activity: 8-22 mins                    G Codes:      Haile Bosler 11-11-2015, 5:10 PM Pager [phone]

## 2015-11-02 NOTE — Progress Notes (Signed)
Speech Language Pathology Treatment: Dysphagia  Patient Details Name: Kathryn [name] MRN: [id number] DOB: 1930/11/17 Today's Date: 11/02/2015 Time: [id number] SLP Time Calculation (min) (ACUTE ONLY): 18 min  Assessment / Plan / Recommendation Clinical Impression  Pt alert, but with poor initiation and quite delayed verbal responses to questions.  Reluctant to eat, but with encouragement she consumed half a container of yogurt.  Requires mod verbal/tactile cues to attend to POs and swallow with effort.  There was no coughing nor overt s/s of aspiration with limited intake during our session.  Recommend continuing dysphagia 1 diet, honey-thick liquids with total assist for feeding.  MS, motivation, poor initiation are primary barriers.     HPI HPI: 82 y.o. female with h/o HTN, HLD, GERD, hypothyroidism, depression, anxiety, TIA, CVA x2 (05/2009, 05/2010), recurrent UTI, chronic back pain and chronic abdominal pain, who presented to ED 2/6 with AMS and visual hallucinations. Pt noted to have seizure 2/8 and neurology consulted. Intubated and sedated from 2/10-2/23 (13 days).  MR Brain 2/7 remote infarcts basal ganglia and corona radiata bilaterally. Remote infarct at the junction of the R thalamus and posterior limb R internal capsule. CXR 2/21 no acute cardiopulmonary abnormality. No previous h/o SLP intervention noted in chart.      SLP Plan  Continue with current plan of care     Recommendations  Diet recommendations: Dysphagia 1 (puree);Honey-thick liquid Liquids provided via: Cup Medication Administration: Crushed with puree Supervision: Full supervision/cueing for compensatory strategies;Staff to assist with self feeding Compensations: Slow rate;Small sips/bites;Other (Comment) (cease intake if coughing with POs) Postural Changes and/or Swallow Maneuvers: Seated upright 90 degrees;Upright 30-60 min after meal             Oral Care Recommendations: Oral care QID Plan: Continue with current  plan of care     GO               Wes Lezotte L. Tivis Ringer, [place] CCC/SLP Pager [phone]  Kathryn [name] 11/02/2015, 3:23 PM

## 2015-11-02 NOTE — Progress Notes (Signed)
EEG Completed; Results Pending  

## 2015-11-03 LAB — GLUCOSE, CAPILLARY
GLUCOSE-CAPILLARY: 109 mg/dL — AB (ref 65–99)
GLUCOSE-CAPILLARY: 107 mg/dL — AB (ref 65–99)
Glucose-Capillary: 121 mg/dL — ABNORMAL HIGH (ref 65–99)

## 2015-11-03 LAB — BASIC METABOLIC PANEL
ANION GAP: 10 (ref 5–15)
BUN: 7 mg/dL (ref 6–20)
CALCIUM: 9.2 mg/dL (ref 8.9–10.3)
CO2: 25 mmol/L (ref 22–32)
Chloride: 103 mmol/L (ref 101–111)
Creatinine, Ser: 0.47 mg/dL (ref 0.44–1.00)
Glucose, Bld: 108 mg/dL — ABNORMAL HIGH (ref 65–99)
Potassium: 3.2 mmol/L — ABNORMAL LOW (ref 3.5–5.1)
Sodium: 138 mmol/L (ref 135–145)

## 2015-11-03 MED ORDER — POTASSIUM CHLORIDE CRYS ER 20 MEQ PO TBCR
20.0000 meq | EXTENDED_RELEASE_TABLET | Freq: Once | ORAL | Status: AC
  Administered 2015-11-03: 20 meq via ORAL
  Filled 2015-11-03: qty 1

## 2015-11-03 MED ORDER — LEVETIRACETAM 500 MG PO TABS: 500.0000 mg | ORAL_TABLET | Freq: Two times a day (BID) | ORAL | Status: DC

## 2015-11-03 MED ORDER — BACLOFEN 10 MG PO TABS: 10.0000 mg | ORAL_TABLET | Freq: Every day | ORAL | Status: DC

## 2015-11-03 MED ORDER — BACLOFEN 10 MG PO TABS: 5.0000 mg | ORAL_TABLET | Freq: Every day | ORAL | Status: DC

## 2015-11-03 MED ORDER — AMANTADINE HCL 100 MG PO CAPS: 100.0000 mg | ORAL_CAPSULE | Freq: Two times a day (BID) | ORAL | Status: DC

## 2015-11-03 MED ORDER — SENNOSIDES 8.8 MG/5ML PO SYRP: 5.0000 mL | ORAL_SOLUTION | Freq: Two times a day (BID) | ORAL | Status: DC | PRN

## 2015-11-03 NOTE — Clinical Social Work Placement (Signed)
   CLINICAL SOCIAL WORK PLACEMENT  NOTE  Date:  11/03/2015  Patient Details  Name: Kathryn [name] MRN: [id number] Date of Birth: 06-28-1931  Clinical Social Work is seeking post-discharge placement for this patient at the [place] level of care (*CSW will initial, date and re-position this form in  chart as items are completed):  Yes   Patient/family provided with [place] Work Department's list of facilities offering this level of care within the geographic area requested by the patient (or if unable, by the patient's family).  Yes   Patient/family informed of their freedom to choose among providers that offer the needed level of care, that participate in Medicare, Medicaid or managed care program needed by the patient, have an available bed and are willing to accept the patient.  Yes   Patient/family informed of [facility]'s ownership interest in [facility] and [facility], as well as of the fact that they are under no obligation to receive care at these facilities.  PASRR submitted to EDS on 10/31/15     PASRR number received on 10/31/15     Existing PASRR number confirmed on       FL2 transmitted to all facilities in geographic area requested by pt/family on 10/31/15     FL2 transmitted to all facilities within larger geographic area on       Patient informed that his/her managed care company has contracts with or will negotiate with certain facilities, including the following:        Yes   Patient/family informed of bed offers received.  Patient chooses bed at [facility]     Physician recommends and patient chooses bed at      Patient to be transferred to [facility] on 11/03/15.  Patient to be transferred to facility by PTAR     Patient family notified on 11/03/15 of transfer.  Name of family member notified:  son     PHYSICIAN       Additional Comment:     _______________________________________________ Benard Halsted, [place] 11/03/2015, 5:12 PM

## 2015-11-03 NOTE — Progress Notes (Signed)
Patient will DC to: [facility] Anticipated DC date: 11/03/15 Family notified: Son Transport by: PTAR  CSW signing off.  Cedric Fishman, [place] Social Worker [phone]

## 2015-11-03 NOTE — Progress Notes (Signed)
Report given to [facility], all prescriptions printed out to be given to staff at facility. rn called son and left a voicemail instructing him that he did not need to pick up medications that were faxed to [facility] because [facility] would not accept outside medications.   PTAR at bedside. IV removed, foley removed, tele leads removed, pt cleaned after having another bowel movement. All discharge paperwork given to PTAR. Facility alerted pt is being transported.

## 2015-11-03 NOTE — Discharge Summary (Addendum)
Physician Discharge Summary  Kathryn [name] [id number] DOB: [birth year] DOA: 10/08/2015  PCP: Lilian Coma, MD  Admit date: 10/08/2015 Discharge date: 11/03/2015  Time spent: > 35 minutes  Recommendations for Outpatient Follow-up:  1. Monitor potassium 2. Monitor blood sugars  Discharge Diagnoses:  Principal Problem:   Acute encephalopathy Active Problems:   Spinal stenosis, lumbar region, with neurogenic claudication   Generalized anxiety disorder   GERD (gastroesophageal reflux disease)   Essential hypertension, benign   Recurrent falls   History of stroke   Polypharmacy   Generalized ischemic cerebrovascular disease   Chronic abdominal pain   Status epilepticus (HCC)   Encephalopathy   Endotracheal tube present   Acute respiratory failure with hypoxia (HCC)   Acute respiratory failure (HCC)   Seizure (HCC)   Seizures (HCC)   General weakness   Parkinson's disease ([place])   Cervical dystonia   Muscle spasms of neck   Discharge Condition: Stable  Diet recommendation: Dysphagia 1 diabetic diet (addendum: dysphagia 1 diet, honey-thick liquids with total assist for feeding)  Filed Weights   10/30/15 0433 10/31/15 0500 11/03/15 0500  Weight: 53.933 kg (118 lb 14.4 oz) 53.978 kg (119 lb) 56.155 kg (123 lb 12.8 oz)    History of present illness:  82 yo female brought to ER due to confusion after falling. She was also having visual hallucinations. She was noted to have seizure on 2/08 and neurology consulted. She was also noted to have fever. She had LP that was unrevealing. She was started on abx for possible UTI, and acyclovir for possible encephalitis. Her seizures persistent, and she was transferred to ICU for diprivan coma and intubation. A shunt was sedated on Diprivan and intubated. EEG performed on 10/17/2015 showed periodic lateralized epileptiform discharges (PLED's). Which resolved on 10/20/2015. Patient was extubated after discussion with family and was made  DO NOT RESUSCITATE.   Hospital Course:   Status epilepticus- resolved, patient was admitted to the ICU on 10/09/2015 with prolonged status epilepticus, was intubated and sedated with Diprivan, was followed by neurology underwent continuous EEG monitoring which has been discontinued. Patient's seizures are controlled with multiple anticonvulsant   Metabolic encephalopathy - Somnolence resolving. Most likely due to multiple seizure medication - Improved once seizure medication was adjusted.  Nutrition-  Currently tolerating a dysphagia 1 diet.  Hypokalemia-  - Most likely due to poor oral intake - provide replacement prior to discharge - recommend reassessing  Acute respiratory failure in setting of status epilepticus- resolved  Hypertension-  Continue amlodipine, cozaar, propranolol  Procedures:  Please refer to EMR for details  Consultations:  Pulmonology  Neurology  Discharge Exam: Filed Vitals:   11/03/15 0551 11/03/15 1038  BP: 165/72 175/63  Pulse: 74 73  Temp: 98.6 F (37 C) 98.4 F (36.9 C)  Resp: 18 18    General: Pt in nad, alert nad awake Cardiovascular: rrr, no rubs Respiratory: cta bl, no wheezes  Discharge Instructions   Discharge Instructions    Call MD for:  difficulty breathing, headache or visual disturbances    Complete by:  As directed      Call MD for:  redness, tenderness, or signs of infection (pain, swelling, redness, odor or green/yellow discharge around incision site)    Complete by:  As directed      Call MD for:  temperature >100.4    Complete by:  As directed      Diet Carb Modified    Complete by:  As directed   Dysphagia  1     Discharge instructions    Complete by:  As directed   Please have patient f/u with neurologist in 2 wks after hospital discharge.     Increase activity slowly    Complete by:  As directed           Current Discharge Medication List    START taking these medications   Details  amantadine  (SYMMETREL) 100 MG capsule Take 1 capsule (100 mg total) by mouth 2 (two) times daily. Qty: 60 capsule, Refills: 0    !! baclofen (LIORESAL) 10 MG tablet Take 1 tablet (10 mg total) by mouth at bedtime. Qty: 30 each, Refills: 0    !! baclofen (LIORESAL) 10 MG tablet Take 0.5 tablets (5 mg total) by mouth daily after breakfast. Qty: 30 each, Refills: 0    levETIRAcetam (KEPPRA) 500 MG tablet Take 1 tablet (500 mg total) by mouth 2 (two) times daily. Qty: 60 tablet, Refills: 0    sennosides (SENOKOT) 8.8 MG/5ML syrup Take 5 mLs by mouth 2 (two) times daily as needed for mild constipation. Qty: 240 mL, Refills: 0     !! - Potential duplicate medications found. Please discuss with provider.    CONTINUE these medications which have NOT CHANGED   Details  amLODipine (NORVASC) 2.5 MG tablet Take 5 mg by mouth daily.     aspirin EC 81 MG tablet Take 81 mg by mouth daily.    Calcium Carbonate-Vitamin D (CALCIUM + D PO) Take 1 tablet by mouth daily. Reported on 10/08/2015    losartan (COZAAR) 100 MG tablet Take 100 mg by mouth daily.    Probiotic Product (PROBIOTIC DAILY PO) Take 1 tablet by mouth daily.    vitamin B-12 (CYANOCOBALAMIN) 100 MCG tablet Take 100 mcg by mouth daily.    vitamin C (ASCORBIC ACID) 500 MG tablet Take 500 mg by mouth daily.    vitamin E 200 UNIT capsule Take 200 Units by mouth daily.     estradiol (VIVELLE-DOT) 0.0375 MG/24HR Place 0.5 patches onto the skin 2 (two) times a week. Sunday and Wed    fluticasone (FLONASE) 50 MCG/ACT nasal spray Place 2 sprays into the nose daily as needed for allergies.     nystatin-triamcinolone (MYCOLOG II) cream Apply 1 application topically 2 (two) times daily.    pantoprazole (PROTONIX) 40 MG tablet Take 1 tablet (40 mg total) by mouth every morning. Qty: 90 tablet, Refills: 2    propranolol (INDERAL) 40 MG tablet Take 40 mg by mouth 3 (three) times daily.    simvastatin (ZOCOR) 40 MG tablet Take 40 mg by mouth every  morning.       STOP taking these medications     ciprofloxacin (CIPRO) 500 MG tablet      clonazePAM (KLONOPIN) 1 MG tablet      fexofenadine (ALLEGRA) 180 MG tablet      glycopyrrolate ([name]) 2 MG tablet      Naproxen Sod-Diphenhydramine (ALEVE PM) 220-25 MG TABS      nitrofurantoin, macrocrystal-monohydrate, (MACROBID) 100 MG capsule      PRESCRIPTION MEDICATION      diazepam (VALIUM) 2 MG tablet      doxycycline (VIBRAMYCIN) 100 MG capsule      traMADol-acetaminophen (ULTRACET) 37.5-325 MG per tablet        Allergies  Allergen Reactions  . Augmentin [Amoxicillin-Pot Clavulanate] Swelling and Diarrhea    Throat   . Citalopram Swelling    Eyes ears and throat swelling Throat  and eyes   . Codeine Anaphylaxis and Shortness Of Breath  . Fish-Derived Products Anaphylaxis  . Hyoscyamine Anaphylaxis and Swelling  . Ibuprofen Swelling    Throat and eyes   . Latex Swelling and Rash    Swelling to troat  . Lipitor [Atorvastatin] Swelling    Muscle aches throat  . Septra [Bactrim] Anaphylaxis  . Shellfish Allergy Anaphylaxis  . Miconazole Rash  . Keflet [Cephalexin] Diarrhea    Upset stomach  . Meloxicam Diarrhea and Nausea And Vomiting  . Sulfa Antibiotics     Rash and also throat was tight  . Verapamil     Tachycardia and flushing  . Vicodin [Hydrocodone-Acetaminophen] Other (See Comments)    stroke  . Zoloft [Sertraline Hcl] Swelling    Red spots all over face, swelling of tongue and legs.   . Ciprofloxacin Nausea And Vomiting    Other reaction(s): Abdominal Pain  . Depakene [Valproate Sodium] Hives    All over body   . Isoptin Sr [Verapamil Hcl Er] Cough  . Lisinopril Cough    Fatigue and cough  . Telmisartan-Hctz Cough   Follow-up Information    Follow up with [facility] .   Specialty:  [place] information:   [address] [place] [phone]       The results of  significant diagnostics from this hospitalization (including imaging, microbiology, ancillary and laboratory) are listed below for reference.    Significant Diagnostic Studies: Dg Chest 2 View  10/08/2015  CLINICAL DATA:  Chronic generalized chest and abdominal pain. Delirium. Initial encounter. EXAM: CHEST  2 VIEW COMPARISON:  Chest radiograph performed 06/16/2014 FINDINGS: The lungs are well-aerated and clear. There is no evidence of focal opacification, pleural effusion or pneumothorax. The heart is normal in size; the mediastinal contour is within normal limits. No acute osseous abnormalities are seen. A chronic compression deformity is noted at the lower thoracic spine. IMPRESSION: No acute cardiopulmonary process seen. Electronically Signed   By: Garald Balding M.D.   On: 10/08/2015 23:58   Dg Abd 1 View  10/30/2015  CLINICAL DATA:  Feeding tube placement EXAM: ABDOMEN - 1 VIEW COMPARISON:  Study obtained earlier in the day FLUOROSCOPY TIME:  0 minutes 1 second; 1 acquired image FINDINGS: Feeding tube tip is at the junction of the duodenum and jejunum. Contrast flows freely through the tube into the proximal jejunum. The visualized bowel gas pattern is normal. IMPRESSION: Feeding tube tip is just beyond the fourth portion the duodenum in the proximal most aspect of the jejunum. Bowel gas pattern unremarkable. Electronically Signed   By: Lowella Grip III M.D.   On: 10/30/2015 14:21   Ct Head Wo Contrast  10/10/2015  CLINICAL DATA:  Recent seizure activity EXAM: CT HEAD WITHOUT CONTRAST TECHNIQUE: Contiguous axial images were obtained from the base of the skull through the vertex without intravenous contrast. COMPARISON:  10/08/2015, 10/09/2015 FINDINGS: Bony calvarium is intact. Mild atrophic changes are noted. Areas of chronic white matter ischemic change are seen in the deep white matter. Additionally lacunar infarcts are noted adjacent to the right lateral ventricle. These are stable from the  prior exam. No acute hemorrhage, acute infarction or space-occupying mass lesion is noted. IMPRESSION: Chronic atrophic and ischemic changes without acute abnormality. Electronically Signed   By: Inez Catalina M.D.   On: 10/10/2015 12:10   Ct Head Wo Contrast  10/08/2015  CLINICAL DATA:  Increased confusion. Altered mental status. Fall.  Patient amnestic for the event. EXAM: CT HEAD WITHOUT CONTRAST TECHNIQUE: Contiguous axial images were obtained from the base of the skull through the vertex without intravenous contrast. COMPARISON:  MR brain 05/22/2013.  CT head 05/03/2012. FINDINGS: No evidence for acute infarction, hemorrhage, mass lesion, hydrocephalus, or extra-axial fluid. Generalized atrophy. Chronic microvascular ischemic change. Scattered areas of chronic lacunar infarction. No CT signs of proximal vascular thrombosis. Advanced vascular calcification in the carotid and vertebral arteries. Calvarium is intact. No sinus or mastoid air fluid level. Similar appearance to priors. IMPRESSION: Atrophy and small vessel disease. Areas of chronic ischemia. No skull fracture or intracranial hemorrhage. Electronically Signed   By: Staci Righter M.D.   On: 10/08/2015 23:02   Mr Brain Wo Contrast  10/09/2015  CLINICAL DATA:  82 year old hypertensive female found down at home with altered mental status. Subsequent encounter. EXAM: MRI HEAD WITHOUT CONTRAST TECHNIQUE: Multiplanar, multiecho pulse sequences of the brain and surrounding structures were obtained without intravenous contrast. COMPARISON:  10/08/2015 CT.  05/22/2013 brain MR. FINDINGS: Exam is motion degraded. Probable artifact left midbrain (series 4, image 18) without discrete acute infarct noted. Remote infarcts basal ganglia and corona radiata bilaterally. Remote infarct at the junction of the right thalamus and posterior limb right internal capsule. Mild to moderate chronic small vessel disease changes. No intracranial hemorrhage. Global atrophy without  hydrocephalus. No intracranial mass lesion noted on this unenhanced exam. Major intracranial vascular structures are patent. Post lens replacement otherwise orbital structures unremarkable. Cervical medullary junction and pituitary region unremarkable. IMPRESSION: Exam is motion degraded. Probable artifact left midbrain without acute infarct noted. Remote infarcts basal ganglia and corona radiata bilaterally. Remote infarct at the junction of the right thalamus and posterior limb right internal capsule. Mild to moderate chronic small vessel disease changes. Global atrophy without hydrocephalus. Electronically Signed   By: Genia Del M.D.   On: 10/09/2015 08:57   Mr Jeri Cos [id number] Contrast  11/02/2015  CLINICAL DATA:  Encephalopathy. Seizures. Previous abnormal MRI of the brain. EXAM: MRI HEAD WITHOUT AND WITH CONTRAST TECHNIQUE: Multiplanar, multiecho pulse sequences of the brain and surrounding structures were obtained without and with intravenous contrast. CONTRAST:  1 MULTIHANCE GADOBENATE DIMEGLUMINE 529 MG/ML IV SOLN COMPARISON:  MRI brain 10/16/2015. FINDINGS: T2 changes within the posterior left thalamus have improved. There is no longer restricted diffusion in the posterior left thalamus. Previously noted cortical and subcortical T2 changes over the left temporal and parietal lobe have resolved. The brain is more atrophic. Remote lacunar infarcts of the basal ganglia and centrum semi ovale are otherwise stable. Periventricular white matter changes are similar to the prior exam. White matter changes of the brainstem are stable. Cerebellum is unremarkable. The internal auditory canals are within normal limits bilaterally. Bilateral lens replacements are noted. The globes and orbits are intact. A prominent fluid level is noted in the left sphenoid sinus. Asymmetric anterior left ethmoid sinus disease is present. A small fluid level is present in the left maxillary sinus. Bilateral mastoid effusions are  evident, left greater than right. No obstructing nasopharyngeal lesion is present. The postcontrast images demonstrate no pathologic enhancement. IMPRESSION: 1. Interval decrease and T2 signal within the posterior left thalamus and resolution of previously noted restricted diffusion. 2. Resolution of previously seen cortical and subcortical T2 changes over the left temporal and parietal lobe. 3. The findings are compatible with resolution of previously noted status epilepticus. 4. No other focal lesion to explain the patient's seizures. 5. Atrophy and white matter disease is  otherwise noted. Cerebral atrophy is similar to the studies from 2 years ago suggesting there was some diffuse cerebral edema on the most recent study. 6. Diffuse sinus disease. Electronically Signed   By: San Morelle M.D.   On: 11/02/2015 11:16   Mr Jeri Cos [id number] Contrast  10/16/2015  CLINICAL DATA:  Seizure. EXAM: MRI HEAD WITHOUT AND WITH CONTRAST TECHNIQUE: Multiplanar, multiecho pulse sequences of the brain and surrounding structures were obtained without and with intravenous contrast. CONTRAST:  21mL MULTIHANCE GADOBENATE DIMEGLUMINE 529 MG/ML IV SOLN COMPARISON:  10/09/2015 FINDINGS: Calvarium and upper cervical spine: No focal marrow signal abnormality. Orbits: Negative. Sinuses and Mastoids: Layering nasopharyngeal fluid in the setting of intubation. Small bilateral mastoid effusions. Brain: There is restricted diffusion with comparatively mild FLAIR hyperintensity in the posterior and medial left thalamus which is newly seen. This area is nonenhancing and swollen. Given the clinical circumstances, seizure related phenomenon (literature describes these findings in ipsilateral pulvinar region in status epilepticus) is favored, but vasculitis or infectious encephalitis are differential considerations. Rapid development and lack of enhancement is inconsistent with neoplasm. The deep venous structures normally enhance. There are no  cortical findings to explain the patient's seizures, including mass, gliosis, or cortical swelling (lateral left temporal lobe DWI signal on axial FLAIR sequence has no correlate swelling or T2 hyperintensity on thin section coronal T2 weighted imaging). FLAIR hyperintensity within sulci bilaterally is likely related to patient's ventilator status. Sulcal debris or hemorrhage should have been present on recent lumbar puncture. There is patchy FLAIR hyperintensity in the bilateral cerebral white matter and pons which is stable from prior and compatible with chronic small vessel disease. Generalized atrophy. IMPRESSION: 1. New signal abnormality in the left thalamus as described above. This appearance could be seen with status epilepticus, infarct, encephalitis, or vasculitis. 2. No specific cortical finding to explain seizure. Electronically Signed   By: Monte Fantasia M.D.   On: 10/16/2015 13:41   Dg Chest Port 1 View  10/23/2015  CLINICAL DATA:  Acute respiratory failure, intubated patient, acute encephalopathy. EXAM: PORTABLE CHEST 1 VIEW COMPARISON:  Portable chest x-ray of October 20, 2015 FINDINGS: The lungs are adequately inflated. There is no focal infiltrate. There is no pleural effusion or pneumothorax. The heart and pulmonary vascularity are normal. The endotracheal tube tip lies approximately 6.6 cm above the carina. The esophagogastric tube tip projects below the inferior margin of the image. The PICC line tip projects over the midportion of the SVC. The observed bony thorax is unremarkable. IMPRESSION: There is no acute cardiopulmonary abnormality. The support tubes are in reasonable position. Electronically Signed   By: David  Martinique M.D.   On: 10/23/2015 07:38   Dg Chest Port 1 View  10/20/2015  CLINICAL DATA:  Acute respiratory failure.  Subsequent encounter. EXAM: PORTABLE CHEST 1 VIEW COMPARISON:  10/18/2015 FINDINGS: Cardiac silhouette normal in size and configuration. Normal mediastinal  and hilar contours. Mild atelectasis at the lung bases, stable. Lungs otherwise clear. No pneumothorax. Endotracheal tube tip is stable projecting 2.5 cm above the carina. Right PICC and oral/nasogastric tube are also well positioned and stable. IMPRESSION: 1. No acute cardiopulmonary disease. 2. Stable support apparatus.  No change from prior study. Electronically Signed   By: Lajean Manes M.D.   On: 10/20/2015 08:48   Dg Chest Port 1 View  10/18/2015  CLINICAL DATA:  Evaluate endotracheal tube placement EXAM: PORTABLE CHEST 1 VIEW COMPARISON:  Portable chest x-ray of 10/17/2015 FINDINGS: The tip of the endotracheal tube  is approximately 2.5 cm above the carina. The lungs appear well aerated. Right central venous line tip overlies the lower SVC. No pneumothorax is seen. The heart is within normal limits in size. IMPRESSION: 1. Tip of endotracheal tube 2.5 cm above the carina. 2. Right central venous line tip overlies the lower SVC. Electronically Signed   By: Ivar Drape M.D.   On: 10/18/2015 08:08   Dg Chest Port 1 View  10/17/2015  CLINICAL DATA:  Endotracheal tube placement. EXAM: PORTABLE CHEST 1 VIEW COMPARISON:  10/16/2015 FINDINGS: Patient slightly rotated to the right endotracheal tube tip is 1.8 cm above the carina. Nasogastric tube courses into the region of the stomach and off the inferior portion of the film as tip is not visualized. Interval placement of right-sided PICC line with tip overlying the region of the SVC. Lungs are adequately inflated without focal consolidation or effusion. Cardiomediastinal silhouette and remainder of the exam is unchanged. IMPRESSION: No acute cardiopulmonary disease. Tubes and lines as described. Electronically Signed   By: Marin Olp M.D.   On: 10/17/2015 07:58   Dg Chest Port 1 View  10/16/2015  CLINICAL DATA:  Intubated patient, acute encephalopathy, acute respiratory failure. EXAM: PORTABLE CHEST 1 VIEW COMPARISON:  Portable chest x-ray of October 15, 2015. FINDINGS: The lungs remain well-expanded. Minimal density at the lung bases has improved. The endotracheal tube tip lies 3.2 cm above the carina. The esophagogastric tube tip projects below the inferior margin of the image. The heart and pulmonary vascularity are normal. The mediastinum is normal in width. There is no pleural effusion or pneumothorax. The observed bony thorax is unremarkable. IMPRESSION: Minimal persistent basilar subsegmental atelectasis, nearly resolved. No evidence of pneumonia nor CHF. The support tubes are in reasonable position. Electronically Signed   By: David  Martinique M.D.   On: 10/16/2015 07:57   Dg Chest Port 1 View  10/15/2015  CLINICAL DATA:  Respiratory failure, cerebral vascular disease, intubated patient. EXAM: PORTABLE CHEST 1 VIEW COMPARISON:  Portable chest x-ray dated October 14, 2015. FINDINGS: The lungs are hyperinflated. There is minimal increased density in the left lateral costophrenic angle today consistent with worsening atelectasis or trace of pleural fluid. There is minimal blunting of the right lateral costophrenic angle which is also more conspicuous. The heart is normal in size. The pulmonary vascularity is not engorged. There is no pneumothorax or pneumomediastinum. The endotracheal tube tip lies 4 cm above the carina. The esophagogastric tube tip projects below the inferior margin of the image. IMPRESSION: Slight interval increased density at both lung bases may reflect very early subsegmental atelectasis. There is no alveolar pneumonia nor large pleural effusion. The support tubes are in reasonable position. Electronically Signed   By: David  Martinique M.D.   On: 10/15/2015 07:45   Dg Chest Port 1 View  10/14/2015  CLINICAL DATA:  Hypoxia EXAM: PORTABLE CHEST 1 VIEW COMPARISON:  October 13, 2015 FINDINGS: Endotracheal tube tip is 3.1 cm above the carina. Nasogastric tube tip and side port are below the diaphragm. No pneumothorax. Lungs are clear. The  heart size and pulmonary vascularity are normal. No adenopathy. No bone lesions. IMPRESSION: Tube positions as described without pneumothorax. No edema or consolidation. Electronically Signed   By: Lowella Grip III M.D.   On: 10/14/2015 07:53   Dg Chest Port 1 View  10/13/2015  CLINICAL DATA:  Respiratory failure EXAM: PORTABLE CHEST 1 VIEW COMPARISON:  10/12/2015; 10/08/2015 FINDINGS: Grossly unchanged cardiac silhouette and mediastinal contours. Interval placement  of enteric tube with tip and side port project below the left hemidiaphragm. Stable positioning of endotracheal tube. No pneumothorax. No focal airspace opacities. No pleural effusion or pneumothorax. No evidence of edema. No acute osseus abnormalities. IMPRESSION: 1. Interval placement of enteric tube with tip and side port projecting below the left hemidiaphragm. Otherwise, stable positioning remaining support apparatus. No pneumothorax. 2.  No acute cardiopulmonary disease. Electronically Signed   By: Sandi Mariscal M.D.   On: 10/13/2015 08:30   Dg Chest Port 1 View  10/12/2015  CLINICAL DATA:  Endotracheal tube placement. EXAM: PORTABLE CHEST 1 VIEW COMPARISON:  Chest x-ray dated 10/11/2015 and chest x-ray dated 10/08/2015. FINDINGS: Endotracheal tube has been placed with tip well positioned approximately 2.5 cm above the level of the carina. Heart size remains normal. Lungs are clear. No pleural effusions seen. No pneumothorax seen. IMPRESSION: Endotracheal tube well positioned with tip approximately 2.5 cm above the level of the carina. Electronically Signed   By: Franki Cabot M.D.   On: 10/12/2015 19:43   Dg Chest Port 1 View  10/11/2015  CLINICAL DATA:  Fever for 1 day. EXAM: PORTABLE CHEST 1 VIEW COMPARISON:  10/08/2015 FINDINGS: Cardiomediastinal silhouette is normal. Mediastinal contours appear intact. The aorta is torturous with mild atherosclerotic calcifications of the arch. There is no evidence of focal airspace consolidation,  pleural effusion or pneumothorax. Osseous structures are without acute abnormality. Soft tissues are grossly normal. IMPRESSION: No active disease. Electronically Signed   By: Fidela Salisbury M.D.   On: 10/11/2015 16:42   Dg Abd Portable 1v  10/30/2015  CLINICAL DATA:  Feeding tube placement EXAM: PORTABLE ABDOMEN - 1 VIEW COMPARISON:  10/29/2015 FINDINGS: There is a feeding tube with the tube coiled within the stomach and the metallic tip located along the fundus of the stomach. There is no bowel dilatation to suggest obstruction. There is no evidence of pneumoperitoneum, portal venous gas or pneumatosis. There are no pathologic calcifications along the expected course of the ureters. The osseous structures are unremarkable. IMPRESSION: Feeding tube with the tube coiled within the stomach and the metallic tip located along the fundus of the stomach. Electronically Signed   By: Kathreen Devoid   On: 10/30/2015 12:40   Dg Abd Portable 1v  10/29/2015  CLINICAL DATA:  Feeding tube placement. EXAM: PORTABLE ABDOMEN - 1 VIEW COMPARISON:  06/16/2014. FINDINGS: The feeding tube tip is in the stomach. It is curved back on itself with the tip in the fundal region. The bowel gas pattern is unremarkable. The lung bases are clear. IMPRESSION: Feeding tube tip coursing down into the stomach. It is curving back on itself with the tip probably in the fundal region. Electronically Signed   By: Marijo Sanes M.D.   On: 10/29/2015 16:48   Dg Addison Bailey G Tube Plc W/fl-no Rad  10/30/2015  CLINICAL DATA:  NASO G TUBE PLACEMENT WITH FLUORO Fluoroscopy was utilized by the requesting physician.  No radiographic interpretation.   Dg Swallowing Func-speech Pathology  10/31/2015  Objective Swallowing Evaluation: Type of Study: MBS-Modified Barium Swallow Study Patient Details Name: DAMERIA PRIBBLE MRN: [id number] Date of Birth: 1930-11-10 Today's Date: 10/31/2015 Time: SLP Start Time (ACUTE ONLY): 1240-SLP Stop Time (ACUTE ONLY): 1300 SLP  Time Calculation (min) (ACUTE ONLY): 20 min Past Medical History: Past Medical History Diagnosis Date . Depression  . Hypertension  . High cholesterol  . Rectal prolapse  . Hx of adenomatous colonic polyps  . Internal hemorrhoids  . Hypothyroidism  .  Fecal incontinence  . TIA (transient ischemic attack)  . History of frequent urinary tract infections    recent . Migraine  . Fatty liver 03/20/13 . Generalized ischemic cerebrovascular disease 10/09/2015 . Stroke [facility]) Sept 1, 2010; Sept 12, 2011 Past Surgical History: Past Surgical History Procedure Laterality Date . Colon surgery  2010, for prolapsed organs after hystecrtomy surgery . Appendectomy  2008 . Cholecystectomy   . Lumbar laminectomy/decompression microdiscectomy N/A 01/07/2013   Procedure: LUMBAR LAMINECTOMY CENTRAL DECOMPRESSION L4-L5, BILATERAL FORAMENOTOMY L4,L5    ;  Surgeon: Tobi Bastos, MD;  Location: [facility];  Service: Orthopedics;  Laterality: N/A; . Back surgery   . Hemorrhoid surgery  09/2008   Archie Endo 12/31/2010 . Abdominal hysterectomy  05/2007   /notes 01/02/2011 HPI: 82 y.o. female with h/o HTN, HLD, GERD, hypothyroidism, depression, anxiety, TIA, CVA x2 (05/2009, 05/2010), recurrent UTI, chronic back pain and chronic abdominal pain, who presented to ED 2/6 with AMS and visual hallucinations. Pt noted to have seizure 2/8 and neurology consulted. Intubated and sedated from 2/10-2/23 (13 days).  MR Brain 2/7 remote infarcts basal ganglia and corona radiata bilaterally. Remote infarct at the junction of the R thalamus and posterior limb R internal capsule. CXR 2/21 no acute cardiopulmonary abnormality. No previous h/o SLP intervention noted in chart. No Data Recorded Assessment / Plan / Recommendation CHL IP CLINICAL IMPRESSIONS 10/31/2015 Therapy Diagnosis Mild-mod pharyngeal phase dysphagia;Mild-mod oral phase dysphagia Clinical Impression Pt presents with a mild-mod dysphagia with deconditioning and fluctuating MS remaining the primary barriers to  progression to PO diet.  Pt maintained neck in flexion during study, with assist needed to elevate head to accept POs and to remain alert.  Nectar-thick liquids were immediately aspirated, eliciting a cough response; honey thick liquids were swallowed safely with adequate transition through pharynx and no penetration nor aspiration.  Purees required extra time for oral preparation; swallow was mildly delayed but material passed through pharynx and into esophagus without deficit.  Recommend initiating a dysphagia 1 diet with honey-thick liquids; meds crushed in puree.  Removal of NG should be considered.  Diet may be advanced at bedside without repeating instrumental study due to adequate sensation of small amounts of aspiration.   Impact on safety and function Moderate aspiration risk   CHL IP TREATMENT RECOMMENDATION 10/31/2015 Treatment Recommendations Therapy as outlined in treatment plan below   Prognosis 10/26/2015 Prognosis for Safe Diet Advancement Fair Barriers to Reach Goals Severity of deficits;Behavior Barriers/Prognosis Comment -- CHL IP DIET RECOMMENDATION 10/31/2015 SLP Diet Recommendations Dysphagia 1 (Puree) solids;Honey thick liquids Liquid Administration via Spoon;Cup Medication Administration Crushed with puree Compensations Minimize environmental distractions;Small sips/bites Postural Changes Seated upright at 90 degrees   CHL IP OTHER RECOMMENDATIONS 10/31/2015 Recommended Consults -- Oral Care Recommendations Oral care BID Other Recommendations Order thickener from pharmacy   CHL IP FOLLOW UP RECOMMENDATIONS 10/31/2015 Follow up Recommendations Skilled Nursing facility   [facility] IP FREQUENCY AND DURATION 10/31/2015 Speech Therapy Frequency (ACUTE ONLY) min 2x/week Treatment Duration 2 weeks      CHL IP ORAL PHASE 10/31/2015 Oral Phase Impaired Oral - Pudding Teaspoon -- Oral - Pudding Cup -- Oral - Honey Teaspoon -- Oral - Honey Cup -- Oral - Nectar Teaspoon -- Oral - Nectar Cup -- Oral - Nectar Straw -- Oral -  Thin Teaspoon -- Oral - Thin Cup -- Oral - Thin Straw -- Oral - Puree Weak lingual manipulation Oral - Mech Soft -- Oral - Regular -- Oral - Multi-Consistency -- Oral - Pill --  Oral Phase - Comment --  CHL IP PHARYNGEAL PHASE 10/31/2015 Pharyngeal Phase Impaired Pharyngeal- Pudding Teaspoon -- Pharyngeal -- Pharyngeal- Pudding Cup -- Pharyngeal -- Pharyngeal- Honey Teaspoon Delayed swallow initiation-vallecula;Pharyngeal residue - pyriform Pharyngeal -- Pharyngeal- Honey Cup Delayed swallow initiation-vallecula;Pharyngeal residue - pyriform Pharyngeal -- Pharyngeal- Nectar Teaspoon -- Pharyngeal -- Pharyngeal- Nectar Cup Delayed swallow initiation-vallecula;Reduced airway/laryngeal closure;Penetration/Aspiration during swallow;Trace aspiration Pharyngeal Material enters airway, passes BELOW cords and not ejected out despite cough attempt by patient Pharyngeal- Nectar Straw -- Pharyngeal -- Pharyngeal- Thin Teaspoon -- Pharyngeal -- Pharyngeal- Thin Cup -- Pharyngeal -- Pharyngeal- Thin Straw -- Pharyngeal -- Pharyngeal- Puree -- Pharyngeal -- Pharyngeal- Mechanical Soft -- Pharyngeal -- Pharyngeal- Regular -- Pharyngeal -- Pharyngeal- Multi-consistency -- Pharyngeal -- Pharyngeal- Pill -- Pharyngeal -- Pharyngeal Comment --  CHL IP CERVICAL ESOPHAGEAL PHASE 10/31/2015 Cervical Esophageal Phase (No Data) Pudding Teaspoon -- Pudding Cup -- Honey Teaspoon -- Honey Cup -- Nectar Teaspoon -- Nectar Cup -- Nectar Straw -- Thin Teaspoon -- Thin Cup -- Thin Straw -- Puree -- Mechanical Soft -- Regular -- Multi-consistency -- Pill -- Cervical Esophageal Comment -- No flowsheet data found. Juan Quam Laurice 10/31/2015, 1:37 PM               Microbiology: No results found for this or any previous visit (from the past 240 hour(s)).   Labs: Basic Metabolic Panel:  Recent Labs Lab 10/29/15 0354 10/30/15 0353 10/31/15 0517 10/31/15 2013 11/03/15 0415  NA 141 140  --   --  138  K 2.9* 3.1*  --  3.1* 3.2*  CL 105  106  --   --  103  CO2 25 25  --   --  25  GLUCOSE 102* 305*  --   --  108*  BUN 21* 19  --   --  7  CREATININE 0.54 0.55  --   --  0.47  CALCIUM 9.5 8.9  --   --  9.2  MG  --   --  1.3*  --   --    Liver Function Tests: No results for input(s): AST, ALT, ALKPHOS, BILITOT, PROT, ALBUMIN in the last 168 hours. No results for input(s): LIPASE, AMYLASE in the last 168 hours. No results for input(s): AMMONIA in the last 168 hours. CBC:  Recent Labs Lab 10/29/15 0354  WBC 3.6*  HGB 10.5*  HCT 32.1*  MCV 93.3  PLT 266   Cardiac Enzymes: No results for input(s): CKTOTAL, CKMB, CKMBINDEX, TROPONINI in the last 168 hours. BNP: BNP (last 3 results)  Recent Labs  10/09/15 0228  BNP 64.5    ProBNP (last 3 results) No results for input(s): PROBNP in the last 8760 hours.  CBG:  Recent Labs Lab 11/02/15 1139 11/02/15 1627 11/02/15 2138 11/03/15 0632 11/03/15 1139  GLUCAP 113* 112* 123* 109* 121*    Signed:  Velvet Bathe MD.  [facility] 11/03/2015, 1:35 PM

## 2015-11-03 NOTE — Progress Notes (Signed)
CM spoke with [place] and relayed message form MD; "Pt does not have NG tube." [place] working on [place] with updated information.  No CM needs were communicated.

## 2015-11-03 NOTE — Progress Notes (Signed)
CSW spoke with [employer]. [facility] can now admit patient. Patient's son is also considering [facility].  Percell Locus Amberleigh Gerken LCSWA [phone]

## 2015-11-03 NOTE — Progress Notes (Signed)
pts family member Johnesha Becker [phone]) called, family wants to speak to the social worker. Stating that he just got off the phone with [facility] and they "renigged on their agreement because the pt had a NG tube". rn explained that pt did not have an NG tube at this time and rn had just talked to social worker who was working on this. Family saying they want to talk to social worker themselves and that "they will call the president of the hospital if they have to". Also saying things like "I am a lawyer and I think [facility] has some legal duty"   rn was not going to give family social works direct number, rn called social work and left message for social work to call rn back

## 2015-11-03 NOTE — Progress Notes (Signed)
Report given to Antoinette at [facility]. 

## 2015-11-03 NOTE — Progress Notes (Addendum)
Addendum: [facility] now rescinding offer due to NG tube.   Patient will discharge to [facility] when medically stable.  Percell Locus Liset Mcmonigle LCSWA [phone]

## 2015-11-03 NOTE — Discharge Instructions (Signed)
Epilepsy °People with epilepsy have times when they shake and jerk uncontrollably (seizures). This happens when there is a sudden change in brain function. Epilepsy may have many possible causes. Anything that disturbs the normal pattern of brain cell activity can lead to seizures. °HOME CARE  °· Follow your doctor's instructions about driving and safety during normal activities. °· Get enough sleep. °· Only take medicine as told by your doctor. °· Avoid things that you know can cause you to have seizures (triggers). °· Write down when your seizures happen and what you remember about each seizure. Write down anything you think may have caused the seizure to happen. °· Tell the people you live and work with that you have seizures. Make sure they know how to help you. They should: °¨ Cushion your head and body. °¨ Turn you on your side. °¨ Not restrain you. °¨ Not place anything inside your mouth. °¨ Call for local emergency medical help if there is any question about what has happened. °· Keep all follow-up visits with your doctor. This is very important. °GET HELP IF: °· You get an infection or start to feel sick. You may have more seizures when you are sick. °· You are having seizures more often. °· Your seizure pattern is changing. °GET HELP RIGHT AWAY IF:  °· A seizure does not stop after a few seconds or minutes. °· A seizure causes you to have trouble breathing. °· A seizure gives you a very bad headache. °· A seizure makes you unable to speak or use a part of your body. °  °This information is not intended to replace advice given to you by your health care provider. Make sure you discuss any questions you have with your health care provider. °  °Document Released: 06/15/2009 Document Revised: 06/08/2013 Document Reviewed: 03/30/2013 °Elsevier Interactive Patient Education ©2016 Elsevier Inc. ° °

## 2015-11-03 NOTE — Progress Notes (Signed)
Family called and asked to get update. rn called back family contact in chart, spoke with susan Ledlow, update her that pt will be d/c today to [facility].

## 2015-11-04 ENCOUNTER — Inpatient Hospital Stay (HOSPITAL_COMMUNITY)
Admission: EM | Admit: 2015-11-04 | Discharge: 2015-12-03 | DRG: 101 | Disposition: A | Discharge status: Hospice | Payer: Medicare Other | Attending: Pulmonary Disease | Admitting: Pulmonary Disease

## 2015-11-04 ENCOUNTER — Emergency Department (HOSPITAL_COMMUNITY): Payer: Medicare Other

## 2015-11-04 ENCOUNTER — Encounter (HOSPITAL_COMMUNITY): Payer: Self-pay | Admitting: Emergency Medicine

## 2015-11-04 DIAGNOSIS — K219 Gastro-esophageal reflux disease without esophagitis: Secondary | ICD-10-CM | POA: Diagnosis present

## 2015-11-04 DIAGNOSIS — F411 Generalized anxiety disorder: Secondary | ICD-10-CM | POA: Diagnosis present

## 2015-11-04 DIAGNOSIS — F32A Depression, unspecified: Secondary | ICD-10-CM | POA: Diagnosis present

## 2015-11-04 DIAGNOSIS — R740 Nonspecific elevation of levels of transaminase and lactic acid dehydrogenase [LDH]: Secondary | ICD-10-CM | POA: Diagnosis present

## 2015-11-04 DIAGNOSIS — F329 Major depressive disorder, single episode, unspecified: Secondary | ICD-10-CM | POA: Diagnosis present

## 2015-11-04 DIAGNOSIS — R569 Unspecified convulsions: Secondary | ICD-10-CM

## 2015-11-04 DIAGNOSIS — Z01818 Encounter for other preprocedural examination: Secondary | ICD-10-CM

## 2015-11-04 DIAGNOSIS — N390 Urinary tract infection, site not specified: Secondary | ICD-10-CM | POA: Diagnosis present

## 2015-11-04 DIAGNOSIS — J9690 Respiratory failure, unspecified, unspecified whether with hypoxia or hypercapnia: Secondary | ICD-10-CM

## 2015-11-04 DIAGNOSIS — R509 Fever, unspecified: Secondary | ICD-10-CM

## 2015-11-04 DIAGNOSIS — E039 Hypothyroidism, unspecified: Secondary | ICD-10-CM | POA: Diagnosis present

## 2015-11-04 DIAGNOSIS — B952 Enterococcus as the cause of diseases classified elsewhere: Secondary | ICD-10-CM | POA: Diagnosis present

## 2015-11-04 DIAGNOSIS — G40901 Epilepsy, unspecified, not intractable, with status epilepticus: Secondary | ICD-10-CM

## 2015-11-04 DIAGNOSIS — G40101 Localization-related (focal) (partial) symptomatic epilepsy and epileptic syndromes with simple partial seizures, not intractable, with status epilepticus: Secondary | ICD-10-CM | POA: Diagnosis not present

## 2015-11-04 DIAGNOSIS — Z79899 Other long term (current) drug therapy: Secondary | ICD-10-CM

## 2015-11-04 DIAGNOSIS — R748 Abnormal levels of other serum enzymes: Secondary | ICD-10-CM | POA: Diagnosis not present

## 2015-11-04 DIAGNOSIS — Z66 Do not resuscitate: Secondary | ICD-10-CM | POA: Diagnosis not present

## 2015-11-04 DIAGNOSIS — E8770 Fluid overload, unspecified: Secondary | ICD-10-CM | POA: Diagnosis not present

## 2015-11-04 DIAGNOSIS — R000 Tachycardia, unspecified: Secondary | ICD-10-CM | POA: Diagnosis present

## 2015-11-04 DIAGNOSIS — Z87440 Personal history of urinary (tract) infections: Secondary | ICD-10-CM

## 2015-11-04 DIAGNOSIS — H5702 Anisocoria: Secondary | ICD-10-CM

## 2015-11-04 DIAGNOSIS — J9600 Acute respiratory failure, unspecified whether with hypoxia or hypercapnia: Secondary | ICD-10-CM

## 2015-11-04 DIAGNOSIS — G4733 Obstructive sleep apnea (adult) (pediatric): Secondary | ICD-10-CM | POA: Diagnosis present

## 2015-11-04 DIAGNOSIS — Z7982 Long term (current) use of aspirin: Secondary | ICD-10-CM

## 2015-11-04 DIAGNOSIS — R296 Repeated falls: Secondary | ICD-10-CM | POA: Diagnosis present

## 2015-11-04 DIAGNOSIS — E876 Hypokalemia: Secondary | ICD-10-CM | POA: Diagnosis not present

## 2015-11-04 DIAGNOSIS — R34 Anuria and oliguria: Secondary | ICD-10-CM | POA: Diagnosis not present

## 2015-11-04 DIAGNOSIS — E870 Hyperosmolality and hypernatremia: Secondary | ICD-10-CM | POA: Diagnosis not present

## 2015-11-04 DIAGNOSIS — G20 Parkinson's disease: Secondary | ICD-10-CM | POA: Diagnosis present

## 2015-11-04 DIAGNOSIS — Z4659 Encounter for fitting and adjustment of other gastrointestinal appliance and device: Secondary | ICD-10-CM

## 2015-11-04 DIAGNOSIS — L8990 Pressure ulcer of unspecified site, unspecified stage: Secondary | ICD-10-CM | POA: Insufficient documentation

## 2015-11-04 DIAGNOSIS — D638 Anemia in other chronic diseases classified elsewhere: Secondary | ICD-10-CM | POA: Diagnosis not present

## 2015-11-04 DIAGNOSIS — G459 Transient cerebral ischemic attack, unspecified: Secondary | ICD-10-CM | POA: Diagnosis present

## 2015-11-04 DIAGNOSIS — Z8673 Personal history of transient ischemic attack (TIA), and cerebral infarction without residual deficits: Secondary | ICD-10-CM

## 2015-11-04 DIAGNOSIS — I639 Cerebral infarction, unspecified: Secondary | ICD-10-CM | POA: Diagnosis present

## 2015-11-04 DIAGNOSIS — R579 Shock, unspecified: Secondary | ICD-10-CM | POA: Diagnosis not present

## 2015-11-04 DIAGNOSIS — Z515 Encounter for palliative care: Secondary | ICD-10-CM | POA: Diagnosis not present

## 2015-11-04 DIAGNOSIS — E785 Hyperlipidemia, unspecified: Secondary | ICD-10-CM | POA: Diagnosis present

## 2015-11-04 DIAGNOSIS — I10 Essential (primary) hypertension: Secondary | ICD-10-CM | POA: Diagnosis present

## 2015-11-04 LAB — COMPREHENSIVE METABOLIC PANEL
ALK PHOS: 92 U/L (ref 38–126)
ALT: 122 U/L — ABNORMAL HIGH (ref 14–54)
ANION GAP: 13 (ref 5–15)
AST: 84 U/L — ABNORMAL HIGH (ref 15–41)
Albumin: 2.8 g/dL — ABNORMAL LOW (ref 3.5–5.0)
BUN: 18 mg/dL (ref 6–20)
CALCIUM: 10.2 mg/dL (ref 8.9–10.3)
CO2: 25 mmol/L (ref 22–32)
CREATININE: 0.65 mg/dL (ref 0.44–1.00)
Chloride: 100 mmol/L — ABNORMAL LOW (ref 101–111)
Glucose, Bld: 121 mg/dL — ABNORMAL HIGH (ref 65–99)
Potassium: 3.9 mmol/L (ref 3.5–5.1)
SODIUM: 138 mmol/L (ref 135–145)
Total Bilirubin: 0.7 mg/dL (ref 0.3–1.2)
Total Protein: 6.7 g/dL (ref 6.5–8.1)

## 2015-11-04 LAB — CBC WITH DIFFERENTIAL/PLATELET
BASOS ABS: 0.1 10*3/uL (ref 0.0–0.1)
BASOS PCT: 1 %
EOS ABS: 0 10*3/uL (ref 0.0–0.7)
Eosinophils Relative: 0 %
HCT: 35.6 % — ABNORMAL LOW (ref 36.0–46.0)
HEMOGLOBIN: 12.1 g/dL (ref 12.0–15.0)
LYMPHS PCT: 21 %
Lymphs Abs: 1.4 10*3/uL (ref 0.7–4.0)
MCH: 31.5 pg (ref 26.0–34.0)
MCHC: 34 g/dL (ref 30.0–36.0)
MCV: 92.7 fL (ref 78.0–100.0)
MONO ABS: 1.2 10*3/uL — AB (ref 0.1–1.0)
Monocytes Relative: 18 %
NEUTROS PCT: 60 %
Neutro Abs: 3.9 10*3/uL (ref 1.7–7.7)
PLATELETS: 269 10*3/uL (ref 150–400)
RBC: 3.84 MIL/uL — AB (ref 3.87–5.11)
RDW: 14.7 % (ref 11.5–15.5)
WBC: 6.6 10*3/uL (ref 4.0–10.5)

## 2015-11-04 LAB — LACTIC ACID, PLASMA: LACTIC ACID, VENOUS: 1.4 mmol/L (ref 0.5–2.0)

## 2015-11-04 MED ORDER — SODIUM CHLORIDE 0.9 % IV SOLN
200.0000 mg | Freq: Once | INTRAVENOUS | Status: AC
  Administered 2015-11-05: 200 mg via INTRAVENOUS
  Filled 2015-11-04: qty 20

## 2015-11-04 MED ORDER — SODIUM CHLORIDE 0.9 % IV SOLN
1000.0000 mg | Freq: Once | INTRAVENOUS | Status: AC
  Administered 2015-11-04: 1000 mg via INTRAVENOUS
  Filled 2015-11-04: qty 10

## 2015-11-04 MED ORDER — SODIUM CHLORIDE 0.9 % IV BOLUS (SEPSIS)
1000.0000 mL | Freq: Once | INTRAVENOUS | Status: AC
  Administered 2015-11-04: 1000 mL via INTRAVENOUS

## 2015-11-04 MED ORDER — LORAZEPAM 2 MG/ML IJ SOLN
1.0000 mg | Freq: Once | INTRAMUSCULAR | Status: AC
Start: 2015-11-04 — End: 2015-11-04
  Administered 2015-11-04: 1 mg via INTRAVENOUS
  Filled 2015-11-04: qty 1

## 2015-11-04 NOTE — ED Notes (Signed)
Pt arrives from [facility] for seizure, had 10 minute focal seizure with EMS including R sided gaze, R sided facial twitch and R sided hand twitch. Resolved with 5MG  versed. Pt hasn't returned to mental baseline, withdrawing from pain on arrival. Pt began seizing again shortly after arrival.

## 2015-11-04 NOTE — ED Notes (Signed)
Family at bedside. 

## 2015-11-04 NOTE — ED Provider Notes (Signed)
CSN: [id number]     Arrival date & time 11/04/15  2224 History   By signing my name below, I, Forrestine Him, attest that this documentation has been prepared under the direction and in the presence of Carmin Muskrat, MD.  Electronically Signed: Forrestine Him, ED Scribe. 11/04/2015. 11:29 PM.   Chief Complaint  Patient presents with  . Seizures   Patient is a 82 y.o. female presenting with seizures. The history is provided by the EMS personnel and a relative. No language interpreter was used.  Seizures Seizure activity on arrival: yes   Seizure type:  Focal Episode characteristics: eye deviation and unresponsiveness   Return to baseline: no   Number of seizures this episode:  2 Progression:  Unchanged History of seizures: yes     LEVEL 5 CAVEAT DUE TO CONDITION   HPI Comments: Alen Blew brought in by EMS from [place] is a 82 y.o. female with a PMHx of HTN, TIA, stroke, who presents to the Emergency Department here after a seizure this evening. Per family, pt had a focal seizure lasting approximately 10 minutes sustained at 10:00 PM this evening. Upon EMS arrival, pt had a R sided gaze, R sided facial twitch, and R sided twitching to the R hand. 5 mg of Versed given en route with some improvement. Ongoing fever also reported. Pt was recently discharged from the hospital after a 1 month stay s/p seizure. During her stay, pt was on a continuous ECG machine for 1.5 weeks. Family states after discharge and return to nursing home, pt was eating with speech/mental status mildly returned to baseline. On arrival here the patient received Ativan due to persistent seizure activity, and Keppra soon thereafter Level V caveat   PCP: Lilian Coma, MD    Past Medical History  Diagnosis Date  . Depression   . Hypertension   . High cholesterol   . Rectal prolapse   . Hx of adenomatous colonic polyps   . Internal hemorrhoids   . Hypothyroidism   . Fecal incontinence   . TIA (transient  ischemic attack)   . History of frequent urinary tract infections     recent  . Migraine   . Fatty liver 03/20/13  . Generalized ischemic cerebrovascular disease 10/09/2015  . Stroke [facility]) Sept 1, 2010; Sept 12, 2011   Past Surgical History  Procedure Laterality Date  . Colon surgery  2010, for prolapsed organs after hystecrtomy surgery  . Appendectomy  2008  . Cholecystectomy    . Lumbar laminectomy/decompression microdiscectomy N/A 01/07/2013    Procedure: LUMBAR LAMINECTOMY CENTRAL DECOMPRESSION L4-L5, BILATERAL FORAMENOTOMY L4,L5    ;  Surgeon: Tobi Bastos, MD;  Location: [facility];  Service: Orthopedics;  Laterality: N/A;  . Back surgery    . Hemorrhoid surgery  09/2008    Archie Endo 12/31/2010  . Abdominal hysterectomy  05/2007    Archie Endo 01/02/2011   Family History  Problem Relation Age of Onset  . Stroke Father   . Migraines Father   . CVA Father   . Heart attack Father   . Hypertension Maternal Aunt     x3  . Colon cancer Neg Hx   . CVA Mother    Social History  Substance Use Topics  . Smoking status: Never Smoker   . Smokeless tobacco: Never Used  . Alcohol Use: No   OB History    No data available     Review of Systems  Unable to perform ROS: Acuity of condition  Allergies  Augmentin; Citalopram; Codeine; Fish-derived products; Hyoscyamine; Ibuprofen; Latex; Lipitor; Septra; Shellfish allergy; Miconazole; Keflet; Meloxicam; Verapamil; Vicodin; Zoloft; Ciprofloxacin; Depakene; Isoptin sr; Lisinopril; Sulfa antibiotics; and Telmisartan-hctz  Home Medications   Prior to Admission medications   Medication Sig Start Date End Date Taking? Authorizing Provider  amantadine (SYMMETREL) 100 MG capsule Take 1 capsule (100 mg total) by mouth 2 (two) times daily. 11/03/15  Yes Velvet Bathe, MD  amLODipine (NORVASC) 5 MG tablet Take 5 mg by mouth daily.   Yes Historical Provider, MD  aspirin EC 81 MG tablet Take 81 mg by mouth daily.   Yes Historical Provider, MD  baclofen  (LIORESAL) 10 MG tablet Take 1 tablet (10 mg total) by mouth at bedtime. 11/03/15  Yes Velvet Bathe, MD  baclofen (LIORESAL) 10 MG tablet Take 0.5 tablets (5 mg total) by mouth daily after breakfast. 11/03/15  Yes Velvet Bathe, MD  Calcium Carbonate-Vitamin D (CALCIUM-VITAMIN D) 500-200 MG-UNIT tablet Take 1 tablet by mouth daily.   Yes Historical Provider, MD  estradiol (VIVELLE-DOT) 0.0375 MG/24HR Place 0.5 patches onto the skin 2 (two) times a week. Sunday and Wed   Yes Historical Provider, MD  fluticasone (FLONASE) 50 MCG/ACT nasal spray Place 2 sprays into the nose daily as needed for allergies.    Yes Historical Provider, MD  levETIRAcetam (KEPPRA) 500 MG tablet Take 1 tablet (500 mg total) by mouth 2 (two) times daily. 11/03/15  Yes Velvet Bathe, MD  losartan (COZAAR) 100 MG tablet Take 100 mg by mouth daily.   Yes Historical Provider, MD  nystatin-triamcinolone (MYCOLOG II) cream Apply 1 application topically 2 (two) times daily.   Yes Historical Provider, MD  pantoprazole (PROTONIX) 40 MG tablet Take 1 tablet (40 mg total) by mouth every morning. 04/18/13  Yes Terance Ice, MD  Probiotic Product (PROBIOTIC DAILY PO) Take 1 tablet by mouth daily.   Yes Historical Provider, MD  propranolol (INDERAL) 40 MG tablet Take 40 mg by mouth 3 (three) times daily. 8am, 2pm, 8pm   Yes Historical Provider, MD  sennosides (SENOKOT) 8.8 MG/5ML syrup Take 5 mLs by mouth 2 (two) times daily as needed for mild constipation. 11/03/15  Yes Velvet Bathe, MD  simvastatin (ZOCOR) 40 MG tablet Take 40 mg by mouth every morning.    Yes Historical Provider, MD  vitamin B-12 (CYANOCOBALAMIN) 100 MCG tablet Take 100 mcg by mouth daily.   Yes Historical Provider, MD  vitamin C (ASCORBIC ACID) 500 MG tablet Take 500 mg by mouth daily.   Yes Historical Provider, MD  vitamin E 200 UNIT capsule Take 200 Units by mouth daily.    Yes Historical Provider, MD   Triage Vitals: BP 119/75 mmHg  Pulse 97  Temp(Src) 100.4 F (38 C)  (Rectal)  Resp 27  SpO2 98%   Physical Exam  Constitutional: She appears listless. She has a sickly appearance.  Inactive but highly sedated   HENT:  Head: Normocephalic and atraumatic.  Eyes: EOM are normal.  Neck: Normal range of motion.  Cardiovascular: Normal rate, regular rhythm and normal heart sounds.   Pulmonary/Chest: Effort normal. She has rales.  Abdominal: Soft. She exhibits no distension. There is no tenderness.  Musculoskeletal: Normal range of motion.  Neurological: She appears listless.  Unable to fully assess, as the patient has required substantial sedation.  Skin: Skin is warm and dry. There is pallor.  Psychiatric: Cognition and memory are impaired.  Nursing note and vitals reviewed.   ED Course  Procedures (including critical  care time)  DIAGNOSTIC STUDIES: Oxygen Saturation is 98% on RA, Normal by my interpretation.    COORDINATION OF CARE: 11:06 PM- Will order CXR, urinalysis, lactic acid, CMP, CBC, i-stat CG4 lactic acid, and EKG. Discussed treatment plan with pt at bedside and family agreed to plan.     Labs Review Labs Reviewed  CBC WITH DIFFERENTIAL/PLATELET - Abnormal; Notable for the following:    RBC 3.84 (*)    HCT 35.6 (*)    All other components within normal limits  COMPREHENSIVE METABOLIC PANEL  I-STAT CG4 LACTIC ACID, ED    Imaging Review Dg Chest Port 1 View  11/04/2015  CLINICAL DATA:  Seizures. Coarse breath sounds. Fever. Altered mental status. EXAM: PORTABLE CHEST 1 VIEW COMPARISON:  10/23/2015 FINDINGS: Interval removal of endotracheal tube, enteric tube, and right central venous catheters. Shallow inspiration with slight atelectasis in the lung bases. Normal heart size and pulmonary vascularity. No focal consolidation in the lungs. No blunting of costophrenic angles. No pneumothorax. Mediastinal contours appear intact. IMPRESSION: Shallow inspiration with slight atelectasis in the lung bases. Electronically Signed   By: Lucienne Capers M.D.   On: 11/04/2015 23:58   I have personally reviewed and evaluated these images and lab results as part of my medical decision-making.   Chart review notable for recent prolonged hospitalization after presentation for status epilepticus. Patient's evaluation most notable for largely reassuring findings, and consistent with patient's prolonged seizure status.     EKG Interpretation   Date/Time:  Sunday November 04 2015 22:26:23 EST Ventricular Rate:  101 PR Interval:  149 QRS Duration: 75 QT Interval:  348 QTC Calculation: 451 R Axis:   44 Text Interpretation:  Sinus tachycardia Baseline wander in lead(s) I III  aVL Sinus tachycardia Baseline wander Abnormal ekg Confirmed by Carmin Muskrat  MD [phone]) on 11/04/2015 11:25:57 PM    Update: Patient remains in similar condition with shallow inspiration, Obvious Rhonchi.  I discussed patient's case with our neurology team. Initial recommendation for additional Vimpat and Keppra loading dose.  Update: Patient maintains in similar condition, unresponsive, no additional seizure activity. Patient has received IV fluids, Keppra, Vimpat. I have updated the family members.   MDM    I personally performed the services described in this documentation, which was scribed in my presence. The recorded information has been reviewed and is accurate.    Patient was discharged yesterday after presenting almost 1 month ago, and status epilepticus now presents from her nursing facility with persistent seizure activity. Here, the patient is unable to provide any details of her condition, but family member state that since discharge yesterday, the patient was interactive, weak, but with no additional seizure activity until just prior to ED arrival. Here, the patient required Ativan, Keppra, Vimpat, given persistent seizure activity, but did have cessation of seizures. Evaluation was also notable for urinary tract infection.   Patient  required admission to the stepdown unit given concern for ongoing seizure activity.   CRITICAL CARE Performed by: Carmin Muskrat Total critical care time: 40 minutes Critical care time was exclusive of separately billable procedures and treating other patients. Critical care was necessary to treat or prevent imminent or life-threatening deterioration. Critical care was time spent personally by me on the following activities: development of treatment plan with patient and/or surrogate as well as nursing, discussions with consultants, evaluation of patient's response to treatment, examination of patient, obtaining history from patient or surrogate, ordering and performing treatments and interventions, ordering and review of  laboratory studies, ordering and review of radiographic studies, pulse oximetry and re-evaluation of patient's condition.   Carmin Muskrat, MD 11/05/15 [phone]

## 2015-11-05 ENCOUNTER — Inpatient Hospital Stay (HOSPITAL_COMMUNITY): Payer: Medicare Other

## 2015-11-05 DIAGNOSIS — N390 Urinary tract infection, site not specified: Secondary | ICD-10-CM | POA: Diagnosis not present

## 2015-11-05 DIAGNOSIS — G40901 Epilepsy, unspecified, not intractable, with status epilepticus: Secondary | ICD-10-CM | POA: Diagnosis not present

## 2015-11-05 DIAGNOSIS — F329 Major depressive disorder, single episode, unspecified: Secondary | ICD-10-CM

## 2015-11-05 DIAGNOSIS — R34 Anuria and oliguria: Secondary | ICD-10-CM | POA: Diagnosis not present

## 2015-11-05 DIAGNOSIS — R569 Unspecified convulsions: Secondary | ICD-10-CM

## 2015-11-05 DIAGNOSIS — R9401 Abnormal electroencephalogram [EEG]: Secondary | ICD-10-CM | POA: Diagnosis not present

## 2015-11-05 DIAGNOSIS — R748 Abnormal levels of other serum enzymes: Secondary | ICD-10-CM | POA: Diagnosis not present

## 2015-11-05 DIAGNOSIS — D638 Anemia in other chronic diseases classified elsewhere: Secondary | ICD-10-CM | POA: Diagnosis not present

## 2015-11-05 DIAGNOSIS — J9620 Acute and chronic respiratory failure, unspecified whether with hypoxia or hypercapnia: Secondary | ICD-10-CM | POA: Diagnosis not present

## 2015-11-05 DIAGNOSIS — E039 Hypothyroidism, unspecified: Secondary | ICD-10-CM | POA: Diagnosis present

## 2015-11-05 DIAGNOSIS — I10 Essential (primary) hypertension: Secondary | ICD-10-CM | POA: Diagnosis not present

## 2015-11-05 DIAGNOSIS — E870 Hyperosmolality and hypernatremia: Secondary | ICD-10-CM | POA: Diagnosis not present

## 2015-11-05 DIAGNOSIS — R296 Repeated falls: Secondary | ICD-10-CM | POA: Diagnosis present

## 2015-11-05 DIAGNOSIS — J9601 Acute respiratory failure with hypoxia: Secondary | ICD-10-CM | POA: Diagnosis not present

## 2015-11-05 DIAGNOSIS — Z7982 Long term (current) use of aspirin: Secondary | ICD-10-CM | POA: Diagnosis not present

## 2015-11-05 DIAGNOSIS — Z66 Do not resuscitate: Secondary | ICD-10-CM | POA: Diagnosis not present

## 2015-11-05 DIAGNOSIS — E876 Hypokalemia: Secondary | ICD-10-CM | POA: Diagnosis not present

## 2015-11-05 DIAGNOSIS — G4733 Obstructive sleep apnea (adult) (pediatric): Secondary | ICD-10-CM | POA: Diagnosis present

## 2015-11-05 DIAGNOSIS — G458 Other transient cerebral ischemic attacks and related syndromes: Secondary | ICD-10-CM

## 2015-11-05 DIAGNOSIS — R579 Shock, unspecified: Secondary | ICD-10-CM | POA: Diagnosis not present

## 2015-11-05 DIAGNOSIS — Z789 Other specified health status: Secondary | ICD-10-CM | POA: Diagnosis not present

## 2015-11-05 DIAGNOSIS — G459 Transient cerebral ischemic attack, unspecified: Secondary | ICD-10-CM | POA: Diagnosis present

## 2015-11-05 DIAGNOSIS — I6320 Cerebral infarction due to unspecified occlusion or stenosis of unspecified precerebral arteries: Secondary | ICD-10-CM | POA: Diagnosis not present

## 2015-11-05 DIAGNOSIS — Z8673 Personal history of transient ischemic attack (TIA), and cerebral infarction without residual deficits: Secondary | ICD-10-CM | POA: Diagnosis not present

## 2015-11-05 DIAGNOSIS — G20 Parkinson's disease: Secondary | ICD-10-CM | POA: Diagnosis not present

## 2015-11-05 DIAGNOSIS — Z515 Encounter for palliative care: Secondary | ICD-10-CM | POA: Diagnosis not present

## 2015-11-05 DIAGNOSIS — E785 Hyperlipidemia, unspecified: Secondary | ICD-10-CM | POA: Diagnosis present

## 2015-11-05 DIAGNOSIS — F411 Generalized anxiety disorder: Secondary | ICD-10-CM | POA: Diagnosis present

## 2015-11-05 DIAGNOSIS — I639 Cerebral infarction, unspecified: Secondary | ICD-10-CM | POA: Insufficient documentation

## 2015-11-05 DIAGNOSIS — G9340 Encephalopathy, unspecified: Secondary | ICD-10-CM | POA: Diagnosis not present

## 2015-11-05 DIAGNOSIS — G40301 Generalized idiopathic epilepsy and epileptic syndromes, not intractable, with status epilepticus: Secondary | ICD-10-CM | POA: Diagnosis not present

## 2015-11-05 DIAGNOSIS — Z79899 Other long term (current) drug therapy: Secondary | ICD-10-CM

## 2015-11-05 DIAGNOSIS — K219 Gastro-esophageal reflux disease without esophagitis: Secondary | ICD-10-CM | POA: Diagnosis present

## 2015-11-05 DIAGNOSIS — F32A Depression, unspecified: Secondary | ICD-10-CM | POA: Diagnosis present

## 2015-11-05 DIAGNOSIS — B952 Enterococcus as the cause of diseases classified elsewhere: Secondary | ICD-10-CM | POA: Diagnosis present

## 2015-11-05 DIAGNOSIS — J9600 Acute respiratory failure, unspecified whether with hypoxia or hypercapnia: Secondary | ICD-10-CM | POA: Diagnosis not present

## 2015-11-05 DIAGNOSIS — Z87440 Personal history of urinary (tract) infections: Secondary | ICD-10-CM | POA: Diagnosis not present

## 2015-11-05 DIAGNOSIS — R000 Tachycardia, unspecified: Secondary | ICD-10-CM | POA: Diagnosis present

## 2015-11-05 DIAGNOSIS — J988 Other specified respiratory disorders: Secondary | ICD-10-CM | POA: Diagnosis not present

## 2015-11-05 DIAGNOSIS — R740 Nonspecific elevation of levels of transaminase and lactic acid dehydrogenase [LDH]: Secondary | ICD-10-CM | POA: Diagnosis present

## 2015-11-05 DIAGNOSIS — G40101 Localization-related (focal) (partial) symptomatic epilepsy and epileptic syndromes with simple partial seizures, not intractable, with status epilepticus: Secondary | ICD-10-CM | POA: Diagnosis present

## 2015-11-05 DIAGNOSIS — E8770 Fluid overload, unspecified: Secondary | ICD-10-CM | POA: Diagnosis not present

## 2015-11-05 LAB — URINALYSIS, ROUTINE W REFLEX MICROSCOPIC
Bilirubin Urine: NEGATIVE
Glucose, UA: NEGATIVE mg/dL
Hgb urine dipstick: NEGATIVE
Ketones, ur: NEGATIVE mg/dL
Nitrite: POSITIVE — AB
Protein, ur: 30 mg/dL — AB
Specific Gravity, Urine: 1.016 (ref 1.005–1.030)
pH: 7 (ref 5.0–8.0)

## 2015-11-05 LAB — CBC WITH DIFFERENTIAL/PLATELET
BASOS PCT: 1 %
Basophils Absolute: 0.1 10*3/uL (ref 0.0–0.1)
EOS ABS: 0 10*3/uL (ref 0.0–0.7)
Eosinophils Relative: 0 %
HCT: 35.1 % — ABNORMAL LOW (ref 36.0–46.0)
HEMOGLOBIN: 11.5 g/dL — AB (ref 12.0–15.0)
LYMPHS ABS: 1.3 10*3/uL (ref 0.7–4.0)
LYMPHS PCT: 14 %
MCH: 30.7 pg (ref 26.0–34.0)
MCHC: 32.8 g/dL (ref 30.0–36.0)
MCV: 93.6 fL (ref 78.0–100.0)
Monocytes Absolute: 1.5 10*3/uL — ABNORMAL HIGH (ref 0.1–1.0)
Monocytes Relative: 17 %
NEUTROS ABS: 6.1 10*3/uL (ref 1.7–7.7)
Neutrophils Relative %: 68 %
Platelets: 267 10*3/uL (ref 150–400)
RBC: 3.75 MIL/uL — ABNORMAL LOW (ref 3.87–5.11)
RDW: 14.9 % (ref 11.5–15.5)
WBC: 9 10*3/uL (ref 4.0–10.5)

## 2015-11-05 LAB — COMPREHENSIVE METABOLIC PANEL
ALT: 100 U/L — ABNORMAL HIGH (ref 14–54)
AST: 55 U/L — ABNORMAL HIGH (ref 15–41)
Albumin: 2.7 g/dL — ABNORMAL LOW (ref 3.5–5.0)
Alkaline Phosphatase: 92 U/L (ref 38–126)
Anion gap: 14 (ref 5–15)
BUN: 16 mg/dL (ref 6–20)
CALCIUM: 10 mg/dL (ref 8.9–10.3)
CHLORIDE: 103 mmol/L (ref 101–111)
CO2: 24 mmol/L (ref 22–32)
CREATININE: 0.62 mg/dL (ref 0.44–1.00)
Glucose, Bld: 126 mg/dL — ABNORMAL HIGH (ref 65–99)
POTASSIUM: 3.8 mmol/L (ref 3.5–5.1)
Sodium: 141 mmol/L (ref 135–145)
Total Bilirubin: 0.6 mg/dL (ref 0.3–1.2)
Total Protein: 6.5 g/dL (ref 6.5–8.1)

## 2015-11-05 LAB — POCT I-STAT 3, ART BLOOD GAS (G3+)
ACID-BASE EXCESS: 1 mmol/L (ref 0.0–2.0)
BICARBONATE: 24.1 meq/L — AB (ref 20.0–24.0)
O2 Saturation: 95 %
PCO2 ART: 35.7 mmHg (ref 35.0–45.0)
PO2 ART: 82 mmHg (ref 80.0–100.0)
Patient temperature: 102.2
TCO2: 25 mmol/L (ref 0–100)
pH, Arterial: 7.445 (ref 7.350–7.450)

## 2015-11-05 LAB — URINE MICROSCOPIC-ADD ON: RBC / HPF: NONE SEEN RBC/hpf (ref 0–5)

## 2015-11-05 LAB — STREP PNEUMONIAE URINARY ANTIGEN: Strep Pneumo Urinary Antigen: NEGATIVE

## 2015-11-05 LAB — INFLUENZA PANEL BY PCR (TYPE A & B)
H1N1FLUPCR: NOT DETECTED
INFLAPCR: NEGATIVE
INFLBPCR: NEGATIVE

## 2015-11-05 LAB — I-STAT CG4 LACTIC ACID, ED: Lactic Acid, Venous: 1.19 mmol/L (ref 0.5–2.0)

## 2015-11-05 LAB — LACTIC ACID, PLASMA
LACTIC ACID, VENOUS: 2.2 mmol/L — AB (ref 0.5–2.0)
Lactic Acid, Venous: 1.4 mmol/L (ref 0.5–2.0)

## 2015-11-05 MED ORDER — DEXTROSE 5 % IV SOLN
2.0000 g | Freq: Two times a day (BID) | INTRAVENOUS | Status: DC
  Administered 2015-11-05 – 2015-11-07 (×4): 2 g via INTRAVENOUS
  Filled 2015-11-05 (×5): qty 2

## 2015-11-05 MED ORDER — LEVETIRACETAM 500 MG/5ML IV SOLN
1500.0000 mg | Freq: Two times a day (BID) | INTRAVENOUS | Status: DC
  Administered 2015-11-05 – 2015-11-17 (×25): 1500 mg via INTRAVENOUS
  Filled 2015-11-05 (×28): qty 15

## 2015-11-05 MED ORDER — KCL IN DEXTROSE-NACL 20-5-0.45 MEQ/L-%-% IV SOLN
INTRAVENOUS | Status: DC
  Administered 2015-11-05 – 2015-11-08 (×5): via INTRAVENOUS
  Filled 2015-11-05 (×7): qty 1000

## 2015-11-05 MED ORDER — METOPROLOL TARTRATE 1 MG/ML IV SOLN
5.0000 mg | Freq: Four times a day (QID) | INTRAVENOUS | Status: DC | PRN
  Administered 2015-11-05 – 2015-11-09 (×7): 5 mg via INTRAVENOUS
  Filled 2015-11-05 (×7): qty 5

## 2015-11-05 MED ORDER — SODIUM CHLORIDE 0.9 % IV SOLN
100.0000 mg | Freq: Two times a day (BID) | INTRAVENOUS | Status: DC
  Administered 2015-11-05: 100 mg via INTRAVENOUS
  Filled 2015-11-05 (×2): qty 10

## 2015-11-05 MED ORDER — ENOXAPARIN SODIUM 40 MG/0.4ML ~~LOC~~ SOLN
40.0000 mg | Freq: Every day | SUBCUTANEOUS | Status: DC
  Administered 2015-11-05 – 2015-11-27 (×23): 40 mg via SUBCUTANEOUS
  Filled 2015-11-05 (×24): qty 0.4

## 2015-11-05 MED ORDER — SODIUM CHLORIDE 0.9 % IV SOLN
200.0000 mg | Freq: Two times a day (BID) | INTRAVENOUS | Status: DC
  Administered 2015-11-06 – 2015-11-16 (×21): 200 mg via INTRAVENOUS
  Filled 2015-11-05 (×39): qty 20

## 2015-11-05 MED ORDER — SODIUM CHLORIDE 0.9 % IV SOLN
100.0000 mg | Freq: Once | INTRAVENOUS | Status: AC
  Administered 2015-11-05: 100 mg via INTRAVENOUS
  Filled 2015-11-05: qty 10

## 2015-11-05 MED ORDER — SODIUM CHLORIDE 0.9 % IV SOLN
INTRAVENOUS | Status: AC
  Administered 2015-11-05: 05:00:00 via INTRAVENOUS

## 2015-11-05 MED ORDER — DEXTROSE 5 % IV SOLN
1.0000 g | Freq: Three times a day (TID) | INTRAVENOUS | Status: DC
  Administered 2015-11-05: 1 g via INTRAVENOUS
  Filled 2015-11-05 (×2): qty 1

## 2015-11-05 MED ORDER — SODIUM CHLORIDE 0.9 % IV SOLN
100.0000 mg | Freq: Two times a day (BID) | INTRAVENOUS | Status: DC
  Filled 2015-11-05 (×2): qty 10

## 2015-11-05 MED ORDER — METRONIDAZOLE IN NACL 5-0.79 MG/ML-% IV SOLN: 500.0000 mg | Freq: Three times a day (TID) | INTRAVENOUS | Status: DC

## 2015-11-05 MED ORDER — DEXTROSE 5 % IV SOLN
1.0000 g | Freq: Once | INTRAVENOUS | Status: AC
  Administered 2015-11-05: 1 g via INTRAVENOUS
  Filled 2015-11-05: qty 1

## 2015-11-05 MED ORDER — VANCOMYCIN HCL IN DEXTROSE 1-5 GM/200ML-% IV SOLN
1000.0000 mg | Freq: Once | INTRAVENOUS | Status: AC
  Administered 2015-11-05: 1000 mg via INTRAVENOUS
  Filled 2015-11-05: qty 200

## 2015-11-05 MED ORDER — VANCOMYCIN HCL 500 MG IV SOLR
500.0000 mg | Freq: Two times a day (BID) | INTRAVENOUS | Status: DC
  Administered 2015-11-06 – 2015-11-08 (×5): 500 mg via INTRAVENOUS
  Filled 2015-11-05 (×7): qty 500

## 2015-11-05 MED ORDER — CETYLPYRIDINIUM CHLORIDE 0.05 % MT LIQD
7.0000 mL | Freq: Two times a day (BID) | OROMUCOSAL | Status: DC
  Administered 2015-11-05 – 2015-11-07 (×5): 7 mL via OROMUCOSAL

## 2015-11-05 NOTE — H&P (Signed)
[facility] History and Physical  [name] [id number] DOB: 04-22-1931 DOA: 11/04/2015   PCP: Lilian Coma, MD    Chief Complaint: Witnessed Seizures  HPI: Kathryn [name] is a 82 y.o. WF PMHx  Depression, TIA, CVA, Parkinson's disease, Seizure  DO, HTN, OSA, Hypothyroidism, Polypharmacy, Frequent falls,  Brought in by EMS from [place] is a 82 y.o. female with a PMHx of HTN, TIA, stroke, who presents to the Emergency Department here after a seizure this evening. Per family, pt had a focal seizure lasting approximately 10 minutes sustained at 10:00 PM this evening. Upon EMS arrival, pt had a R sided gaze, R sided facial twitch, and R sided twitching to the R hand. 5 mg of Versed given en route with some improvement. Ongoing fever also reported. Pt was discharged 3/4 for Acute Encephalopathy/Status Epilepticus (month-long stay). During her stay, pt was on a continuous ECG machine for 1.5 weeks. Family states after discharge and return to nursing home, pt was eating with speech/mental status mildly returned to baseline.   Review of Systems: The patient denies anorexia, fever, weight loss,, vision loss, decreased hearing, hoarseness, chest pain, syncope, dyspnea on exertion, peripheral edema, balance deficits, hemoptysis, abdominal pain, melena, hematochezia, severe indigestion/heartburn, hematuria, incontinence, genital sores, muscle weakness, suspicious skin lesions, transient blindness, difficulty walking, depression, unusual weight change, abnormal bleeding, enlarged lymph nodes, angioedema, and breast masses.    TRAVEL HISTORY: NA   Consultants:  [facility]   Procedure/Significant Events:  2/15 overnight EEG with video;-demonstrated left hemispheric periodic lateralized epileptiform discharges -suggestive of stable significant degree of cortical irritability in the left hemisphere  3/3 MRI brain W/W contrast;-Remote lacunar infarcts of the basal  ganglia and centrum semi ovale are otherwise stable -Resolution of previously seen cortical and subcortical T2 changes over the left temporal and parietal lobe.-Atrophy and white matter disease is otherwise noted  Culture  3/6 urine pending   Antibiotics:  Aztreonam 3/6 x 1 dose   DVT prophylaxis:  Subcutaneous heparin  Devices   NA  LINES / TUBES:   NA  Past Medical History  Diagnosis Date  . Depression   . Hypertension   . High cholesterol   . Rectal prolapse   . Hx of adenomatous colonic polyps   . Internal hemorrhoids   . Hypothyroidism   . Fecal incontinence   . TIA (transient ischemic attack)   . History of frequent urinary tract infections     recent  . Migraine   . Fatty liver 03/20/13  . Generalized ischemic cerebrovascular disease 10/09/2015  . Stroke [facility]) Sept 1, 2010; Sept 12, 2011   Past Surgical History  Procedure Laterality Date  . Colon surgery  2010, for prolapsed organs after hystecrtomy surgery  . Appendectomy  2008  . Cholecystectomy    . Lumbar laminectomy/decompression microdiscectomy N/A 01/07/2013    Procedure: LUMBAR LAMINECTOMY CENTRAL DECOMPRESSION L4-L5, BILATERAL FORAMENOTOMY L4,L5    ;  Surgeon: Tobi Bastos, MD;  Location: [facility];  Service: Orthopedics;  Laterality: N/A;  . Back surgery    . Hemorrhoid surgery  09/2008    Archie Endo 12/31/2010  . Abdominal hysterectomy  05/2007    Archie Endo 01/02/2011   Social History:  reports that she has never smoked. She has never used smokeless tobacco. She reports that she does not drink alcohol or use illicit drugs. where does patient live--home, ALF, SNF? Home by herself per family Can patient participate in ADLs? No  Allergies  Allergen Reactions  .  Augmentin [Amoxicillin-Pot Clavulanate] Swelling and Diarrhea    Throat   . Citalopram Swelling    Eyes ears and throat swelling Throat and eyes   . Codeine Anaphylaxis and Shortness Of Breath  . Fish-Derived Products Anaphylaxis  .  Hyoscyamine Anaphylaxis and Swelling  . Ibuprofen Swelling    Throat and eyes   . Latex Swelling and Rash    Swelling to troat  . Lipitor [Atorvastatin] Swelling    Muscle aches throat  . Septra [Bactrim] Anaphylaxis  . Shellfish Allergy Anaphylaxis  . Miconazole Rash  . Keflet [Cephalexin] Diarrhea    Upset stomach  . Meloxicam Diarrhea and Nausea And Vomiting  . Verapamil Other (See Comments)    Tachycardia and flushing  . Vicodin [Hydrocodone-Acetaminophen] Other (See Comments)    stroke  . Zoloft [Sertraline Hcl] Swelling    Red spots all over face, swelling of tongue and legs.   . Ciprofloxacin Nausea And Vomiting    Other reaction(s): Abdominal Pain  . Depakene [Valproate Sodium] Hives    All over body   . Isoptin Sr [Verapamil Hcl Er] Cough  . Lisinopril Cough    Fatigue and cough  . Sulfa Antibiotics Rash    Rash and also throat was tight  . Telmisartan-Hctz Cough    Family History  Problem Relation Age of Onset  . Stroke Father   . Migraines Father   . CVA Father   . Heart attack Father   . Hypertension Maternal Aunt     x3  . Colon cancer Neg Hx   . CVA Mother    Spoke with family members and family members stated no HTN, MI, HLD, Mental illness, CVA, Thyroid Dz or Cancer in their family history. Unable to obtain history from patient secondary to AMS.    Prior to Admission medications   Medication Sig Start Date End Date Taking? Authorizing Provider  amantadine (SYMMETREL) 100 MG capsule Take 1 capsule (100 mg total) by mouth 2 (two) times daily. 11/03/15  Yes Velvet Bathe, MD  amLODipine (NORVASC) 5 MG tablet Take 5 mg by mouth daily.   Yes Historical Provider, MD  aspirin EC 81 MG tablet Take 81 mg by mouth daily.   Yes Historical Provider, MD  baclofen (LIORESAL) 10 MG tablet Take 1 tablet (10 mg total) by mouth at bedtime. 11/03/15  Yes Velvet Bathe, MD  baclofen (LIORESAL) 10 MG tablet Take 0.5 tablets (5 mg total) by mouth daily after breakfast. 11/03/15   Yes Velvet Bathe, MD  Calcium Carbonate-Vitamin D (CALCIUM-VITAMIN D) 500-200 MG-UNIT tablet Take 1 tablet by mouth daily.   Yes Historical Provider, MD  estradiol (VIVELLE-DOT) 0.0375 MG/24HR Place 0.5 patches onto the skin 2 (two) times a week. Sunday and Wed   Yes Historical Provider, MD  fluticasone (FLONASE) 50 MCG/ACT nasal spray Place 2 sprays into the nose daily as needed for allergies.    Yes Historical Provider, MD  levETIRAcetam (KEPPRA) 500 MG tablet Take 1 tablet (500 mg total) by mouth 2 (two) times daily. 11/03/15  Yes Velvet Bathe, MD  losartan (COZAAR) 100 MG tablet Take 100 mg by mouth daily.   Yes Historical Provider, MD  nystatin-triamcinolone (MYCOLOG II) cream Apply 1 application topically 2 (two) times daily.   Yes Historical Provider, MD  pantoprazole (PROTONIX) 40 MG tablet Take 1 tablet (40 mg total) by mouth every morning. 04/18/13  Yes Terance Ice, MD  Probiotic Product (PROBIOTIC DAILY PO) Take 1 tablet by mouth daily.   Yes Historical  Provider, MD  propranolol (INDERAL) 40 MG tablet Take 40 mg by mouth 3 (three) times daily. 8am, 2pm, 8pm   Yes Historical Provider, MD  sennosides (SENOKOT) 8.8 MG/5ML syrup Take 5 mLs by mouth 2 (two) times daily as needed for mild constipation. 11/03/15  Yes Velvet Bathe, MD  simvastatin (ZOCOR) 40 MG tablet Take 40 mg by mouth every morning.    Yes Historical Provider, MD  vitamin B-12 (CYANOCOBALAMIN) 100 MCG tablet Take 100 mcg by mouth daily.   Yes Historical Provider, MD  vitamin C (ASCORBIC ACID) 500 MG tablet Take 500 mg by mouth daily.   Yes Historical Provider, MD  vitamin E 200 UNIT capsule Take 200 Units by mouth daily.    Yes Historical Provider, MD   Physical Exam: Filed Vitals:   11/05/15 0000 11/05/15 0030 11/05/15 0100 11/05/15 0130  BP: 186/87 164/64 146/87 144/69  Pulse: 88 89 95 86  Temp:      TempSrc:      Resp: 19 19 20 17   SpO2: 100% 100% 100% 100%     General: Patient obtunded secondary to medication,  No acute respiratory distress Eyes: negative scleral hemorrhage ENT: Negative Runny nose, negative gingival bleeding Neck:  Negative scars, masses, torticollis, lymphadenopathy, JVD Lungs: Clear to auscultation bilaterally without wheezes or crackles Cardiovascular: Regular rate and rhythm without murmur gallop or rub normal S1 and S2 Abdomen:negative abdominal pain, negative dysphagia, Nontender, nondistended, soft, bowel sounds positive, no rebound, no ascites, no appreciable mass Extremities: No significant cyanosis, clubbing, or edema bilateral lower extremities Psychiatric:  Unable to assess secondary to altered mental status  Neurologic:  Unable to assess secondary to altered mental status Skin/Hemolytic/lymphatic; negative purpura, petechia,     Labs on Admission:  Basic Metabolic Panel:  Recent Labs Lab 10/29/15 0354 10/30/15 0353 10/31/15 0517 10/31/15 2013 11/03/15 0415 11/04/15 2236  NA 141 140  --   --  138 138  K 2.9* 3.1*  --  3.1* 3.2* 3.9  CL 105 106  --   --  103 100*  CO2 25 25  --   --  25 25  GLUCOSE 102* 305*  --   --  108* 121*  BUN 21* 19  --   --  7 18  CREATININE 0.54 0.55  --   --  0.47 0.65  CALCIUM 9.5 8.9  --   --  9.2 10.2  MG  --   --  1.3*  --   --   --    Liver Function Tests:  Recent Labs Lab 11/04/15 2236  AST 84*  ALT 122*  ALKPHOS 92  BILITOT 0.7  PROT 6.7  ALBUMIN 2.8*   No results for input(s): LIPASE, AMYLASE in the last 168 hours. No results for input(s): AMMONIA in the last 168 hours. CBC:  Recent Labs Lab 10/29/15 0354 11/04/15 2236  WBC 3.6* 6.6  NEUTROABS  --  3.9  HGB 10.5* 12.1  HCT 32.1* 35.6*  MCV 93.3 92.7  PLT 266 269   Cardiac Enzymes: No results for input(s): CKTOTAL, CKMB, CKMBINDEX, TROPONINI in the last 168 hours.  BNP (last 3 results)  Recent Labs  10/09/15 0228  BNP 64.5    ProBNP (last 3 results) No results for input(s): PROBNP in the last 8760 hours.  CBG:  Recent Labs Lab  11/02/15 1627 11/02/15 2138 11/03/15 0632 11/03/15 1139 11/03/15 1624  GLUCAP 112* 123* 109* 121* 107*    Radiological Exams on Admission: Dg Chest [facility]  1 View  11/04/2015  CLINICAL DATA:  Seizures. Coarse breath sounds. Fever. Altered mental status. EXAM: PORTABLE CHEST 1 VIEW COMPARISON:  10/23/2015 FINDINGS: Interval removal of endotracheal tube, enteric tube, and right central venous catheters. Shallow inspiration with slight atelectasis in the lung bases. Normal heart size and pulmonary vascularity. No focal consolidation in the lungs. No blunting of costophrenic angles. No pneumothorax. Mediastinal contours appear intact. IMPRESSION: Shallow inspiration with slight atelectasis in the lung bases. Electronically Signed   By: Lucienne Capers M.D.   On: 11/04/2015 23:58    EKG: N/A  Assessment/Plan Principal Problem:   Seizures (HCC) Active Problems:   Recurrent falls   Polypharmacy   Parkinson's disease (HCC)   OSA (obstructive sleep apnea)   Depression   TIA (transient ischemic attack)   CVA (cerebral infarction)   Essential hypertension   Seizure disorder -Seen and evaluated by Neurology per ED physician, note currently not available -Patient recently discharged 3/4 for Acute Encephalopathy/Status Epilepticus -Nothing by mouth; until more appropriate then swallow study -Per neurology;  lacosamide 200 mg 1 followed by 100 mg BID  increase Keppra dose to 1500 twice a day  EEG in the a.m.  D/C Aantadine  Parkinson's disease -Patient's MAR does not reflect treatment for Parkinson's.  TIA/CVA -MRI just prior to patient's discharge on 3/3 shows old lacunar infarcts. No findings to suggest cause of new-onset seizure  Depression -Patient's not receiving treatment.  OSA -CPAP per respiratory  Recurrent falls -PT/OT evaluate at appropriate time. Patient may need to consider assisted living facility/SNF  Polypharmacy  UTI? -Urine culture pending   Code Status:  DO NOT RESUSCITATE  Family Communication: Family  Disposition Plan: Resolution seizures   Care during the described time interval was provided by me .  I have reviewed this patient's available data, including medical history, events of note, physical examination, and all test results as part of my evaluation. I have personally reviewed and interpreted all radiology studies.    Time spent: 60 minutes  Allie Bossier [facility] Pager [phone]  If 7PM-7AM, please contact night-coverage [email or web address] Password [email or web address] 11/05/2015, 2:17 AM

## 2015-11-05 NOTE — Progress Notes (Signed)
Bedside EEG completed, results pending. 

## 2015-11-05 NOTE — ED Notes (Signed)
Family leaving. Update Son Scarlette Slice as needed. (home) [phone] (cell) [phone]

## 2015-11-05 NOTE — Progress Notes (Signed)
Pt transferred with EEG equipment to [place].  EEG running

## 2015-11-05 NOTE — Progress Notes (Signed)
LTM EEG started per order of Dr Nicole Kindred based on routine EEG findings.

## 2015-11-05 NOTE — Consult Note (Addendum)
Neurology Consultation Reason for Consult: Seizures Referring Physician: Vanita Panda, R  CC: Seizures  History is obtained from: Patient  HPI: Kathryn [name] is a 82 y.o. female with a history of seizures that started in February. She had status epilepticus which was subtle clinically. She was ultimately treated with propofol/Versed suppression of periodic lateralized epileptiform discharges and following this, she had significant improvement in her mental status after weaning from medications. Given that she was still sedated and it was thought medications were contributing, she continued to have her antiepileptics weaned down. She was discharged yesterday to a skilled nursing facility after having improved point where she was able to carry on conversation fairly well. She was discharged on Keppra 500 mg twice a day  In nursing home tonight, she had a 10 minute long seizure right-sided gaze, right facial twitching and right hand twitching. This resolved with Versed, however she had a subsequent recurrent seizure in the emergency department. She still remains sedated.   ROS:  Unable to obtain due to altered mental status.   Past Medical History  Diagnosis Date  . Depression   . Hypertension   . High cholesterol   . Rectal prolapse   . Hx of adenomatous colonic polyps   . Internal hemorrhoids   . Hypothyroidism   . Fecal incontinence   . TIA (transient ischemic attack)   . History of frequent urinary tract infections     recent  . Migraine   . Fatty liver 03/20/13  . Generalized ischemic cerebrovascular disease 10/09/2015  . Stroke [facility]) Sept 1, 2010; Sept 12, 2011     Family History  Problem Relation Age of Onset  . Stroke Father   . Migraines Father   . CVA Father   . Heart attack Father   . Hypertension Maternal Aunt     x3  . Colon cancer Neg Hx   . CVA Mother      Social History:  reports that she has never smoked. She has never used smokeless tobacco. She reports that she  does not drink alcohol or use illicit drugs.   Exam: Current vital signs: BP 145/59 mmHg  Pulse 72  Temp(Src) 100.4 F (38 C) (Rectal)  Resp 23  SpO2 100% Vital signs in last 24 hours: Temp:  [98.2 F (36.8 C)-100.4 F (38 C)] 100.4 F (38 C) (03/05 2253) Pulse Rate:  [72-97] 72 (03/05 2300) Resp:  [23-27] 23 (03/05 2300) BP: (119-145)/(59-75) 145/59 mmHg (03/05 2300) SpO2:  [98 %-100 %] 100 % (03/05 2300)   Physical Exam  Constitutional: Appears elderly. Psych: Affect appropriate to situation Eyes: No scleral injection HENT: No OP obstrucion Head: Normocephalic.  Cardiovascular: Normal rate and regular rhythm.  Respiratory: Sonorous respirations with upper airway sounds GI: Soft.  No distension. There is no tenderness.  Skin: WDI  Neuro: Mental Status: Patient is obtunded, but does partially open eyes to vigorous noxious stimulation. She does not follow commands Cranial Nerves: II: She does not blink to threat from either direction Pupils are slightly unequal. III,IV, VI: She has a midline gaze, does not clearly look to either direction. V: Facial sensation is symmetric to temperature VII: Facial movement is symmetric.  VIII: hearing is intact to voice X: Uvula elevates symmetrically XI: Shoulder shrug is symmetric. XII: tongue is midline without atrophy or fasciculations.  Motor: She has good strength on the left with purposeful movements., She withdraws briskly on the right, does have some quasi-purposeful movements on the right as well. Sensory:  She responds to noxious stimuli in all 4 extremities Cerebellar: Does not comply   I have reviewed labs in epic and the results pertinent to this consultation are: CMP-unremarkable  I have reviewed the images obtained: MRI 3/3 improvement in previously identified T2 signal change.   Impression: 82 year old female with recurrent seizures after weaning down her medications. With quadrant purposeful movements  bilaterally, I suspect that her seizures have stopped. Given her history, however, I am going to load her with lacosamide in addition to her levetiracetam.  Recommendations: 1) lacosamide 200 mg 1 followed by 100 mg twice a day 2) increase dose to 1500 twice a day 3) EEG in the a.m. 4) discontinue amantadine  Roland Rack, MD [facility] [phone]  If 7pm- 7am, please page neurology on call as listed in [place].

## 2015-11-05 NOTE — Procedures (Signed)
ELECTROENCEPHALOGRAM REPORT  Date of Study: 11/05/2015  Patient's Name: Kathryn [name] MRN: [id number] Date of Birth: 02/17/1931  Referring Provider: Dr. Roland Rack  Clinical History: This is an 82 year old woman with a history of status epilepticus in the past, admitted for recurrent seizure, remains unresponsive.  Medications: lacosamide (VIMPAT) levETIRAcetam (KEPPRA) SYMMETREL)  amLODipine (NORVASC) aspirin EC  baclofen (LIORESAL) Calcium Carbonate-Vitamin D (CALCIUM-VITAMIN D)  losartan (COZAAR)  pantoprazole (PROTONIX)  propranolol (INDERAL)  simvastatin (ZOCOR)  Technical Summary: A multichannel digital EEG recording measured by the international 10-20 system with electrodes applied with paste and impedances below 5000 ohms performed as portable with EKG monitoring in an unresponsive patient.  Hyperventilation and photic stimulation were not performed.  The digital EEG was referentially recorded, reformatted, and digitally filtered in a variety of bipolar and referential montages for optimal display.   Description: The patient is unresponsive during the recording. She is noted to have snoring respirations and right-sided gaze by technician. There is no clear posterior dominant rhythm. The background consists of a moderate amount of diffuse 4-5 Hz theta and 2-3 Hz delta slowing, with additional focal 1-2 Hz delta slowing seen over the left hemisphere. There are lateralized periodic discharges (LPDs) seen over the left hemisphere, maximal over the left midtemporal region, at a frequency of 1/second, waxing and waning in amplitude but without clear evolution seen. Normal sleep architecture is not seen. Hyperventilation and photic stimulation were not performed.  There were no clear electrographic seizures seen.    EKG lead was unremarkable.  Impression: This EEG is abnormal due to abnormal due to the presence of: 1. Moderate diffuse slowing of the background 2.  Additional focal slowing over the left hemisphere 3. Lateralized periodic discharges over the left hemisphere at a frequency of 1/sec  Clinical Correlation of the above findings indicates bilateral cerebral dysfunction, left greater than right. Although lateralized periodic discharges do not represent ongoing seizure activity, they are commonly associated with an acute brain lesion and clinical focal seizures, or post-ictal after focal status epilepticus. Would continue close monitoring of this patient.    Ellouise Newer, M.D.

## 2015-11-05 NOTE — Progress Notes (Signed)
Pharmacy Antibiotic Note  Kathryn [name] is a 82 y.o. female admitted on 11/04/2015 with pneumonia.  Pharmacy has been consulted for vancomycin and ceftazidime dosing.  Plan: Vancomycin 1 g x 1 then 500 mg IV every 24 hours.  Goal trough 15-20 mcg/mL.  Ceftazidime 2 g q12h     Temp (24hrs), Avg:100.9 F (38.3 C), Min:98.2 F (36.8 C), Max:102 F (38.9 C)   Recent Labs Lab 10/30/15 0353 11/03/15 0415 11/04/15 2236 11/05/15 0155 11/05/15 0200  WBC  --   --  6.6  --   --   CREATININE 0.55 0.47 0.65  --   --   LATICACIDVEN  --   --  1.4 1.4 1.19    Estimated Creatinine Clearance: 44.1 mL/min (by C-G formula based on Cr of 0.65).    Antimicrobials this admission: Aztreonam 3/6 x 1 Vancomycin 3/6>> Metronidazole x1 Ceftazidime 3/6>> (patient had from 2/10 - 2/12)  Dose adjustments this admission: N/A  Microbiology results: 3/5 Flu Neg 3/6 Blood x 2 3/6 Urine MRSA PCR  Levester Fresh, PharmD, BCPS, [facility] Clinical Pharmacist Pager [phone] 11/05/2015 6:04 PM

## 2015-11-05 NOTE — Progress Notes (Signed)
[facility] PROGRESS NOTE  Kathryn [name] [id number] DOB: 1930-11-11 DOA: 11/04/2015 PCP: Lilian Coma, MD  Brief Summary  The patient is an 44-yo female with history of stroke, HTN, HLD, hypothyroidism, GERD, generalized anxiety, recurrent UTI, Parkinson's disease, and epilepsy who was recently hospitalized from 10/08/2015 through 11/03/2015.  She was initially hospitalized secondary to altered mental status felt to be due to UTI.  Her mentation worsened despite UTI treatment and decreasing her sedating medications.  EEG was performed which demonstrated status epilepticus.  She was seen by neurology and AEDs were initiated.  She underwent LP due to ongoing fevers, which was negative for bacterial meningitis.  She became increasingly obtunded requiring intubation for airway protection.  Eventually, her fevers and seizures abated.  Her AEDs were tapered due reduce her level of sedation.  She was cleared for a dysphagia 1 diet per SLP and discharged to [facility] on 3/4 on keppra 500mg  BID.  At that time, she was somewhat conversant with her family but very deconditioned.  On the evening of 3/5, the facility called her son to let him know she had had another seizure and she was being transported back to the hospital.  She has been obtunded since arrival.  She is DNR/DNI and a difficult airway.  I suspect she either developed an infection which triggered a seizure, OR, more likely, she had seizures after her AEDs were tapered, she seized, and then developed aspiration pneumonia.    Assessment/Plan  Obtundation likely due to seizures, post-ictal state, and benzodiazepines.  -  Aspiration, falls, and seizure precautions -  Cardiac and pulse ox monitoring -  EEG abnl but no seizures seen during record -  Neurology following -  Resumed on vimpat 100mg  IV BID -  Keppra increased to 1500mg  IV BID -  NPO with IVF for now -  Check ABG -  Ammonia level during last admission was normal -  IF she  starts to awaken, would anticipate SLP eval and eventual discharge back to SNF -  IF she does not start to awaken, discussed palliation/comfort measures with son -  CK 79  Fevers and possible pneumonia on CXR which could be atelectasis or could represent aspiration pneumonia (or less likely HCAP).  She had unexplained fevers during her last hospitalization.  UA with only 0-5 WBC so low suspicion for UTI. -  Start vancomycin, aztreonam, and flagyl  -  F/u blood culture -  S. pneumo ag -  Sputum culture if able -  F/u urine culture -  Repeat blood cultures -  Flu PCR: negative  TIA/CVA, MRI on 3/3 demonstrated no acute infarcts, only old lacunar infarcts and changes related to recovery from status epilepticus  Parkinson's disease, holding amantadine, unsafe for pills  Hypertension, elevated SBP, start prn metoprolol  OSA, patient normally uses CPAP, however, not safe if patient obtunded and aspirating -  May resume nightly cpap if she awakens more  Recurrent falls and generalized weakness, anticipate back to SNF eventually -  PT/OT once more alert  Transaminitis, may be due to AEDs, dehydration, or mild rhabdo from seizures -  Repeat LFTs  Diet:  NPO Access:  PIV IVF:  yes Proph:  lovenox  Code Status: DNR Family Communication: patient and her son Disposition Plan: stepdown    Consultants:  Neurology  Procedures:  EEG  Antibiotics:  Vanc 3/6  Aztreonam 3/6   Flagyl 3/6    HPI/Subjective:  obtunded  Objective: Filed Vitals:   11/05/15 1500 11/05/15  1530 11/05/15 1550 11/05/15 1600  BP: 166/74 161/75 161/75 168/75  Pulse: 98 102 103 101  Temp: 101.7 F (38.7 C) 101.8 F (38.8 C)  102 F (38.9 C)  TempSrc:      Resp: 22 24 23 22   SpO2: 98% 97% 97% 97%    Intake/Output Summary (Last 24 hours) at 11/05/15 1708 Last data filed at 11/05/15 0321  Gross per 24 hour  Intake   2050 ml  Output      0 ml  Net   2050 ml   There were no vitals filed for  this visit. There is no weight on file to calculate BMI.  Exam:   General:  Adult female, No acute distress, eyes closed and head tilted back asleep, unable to follow commands, facial twitching noted that looks like small brief smiles  HEENT:  NCAT, dry MM, eyes deviated to right with nystagmus, right pupil smaller than left pupil by 59mm  Cardiovascular:  RRR, nl S1, S2 no mrg, 2+ pulses, warm extremities  Respiratory:  CTAB, no increased WOB  Abdomen:   NABS, soft, NT/ND  MSK:   Decreased tone and bulk, no LEE  Neuro:  No facial asymmetry.  Withdraws left hand somewhat purposefully.  Moves all extremities to painful stimuli.  No posturing  Data Reviewed: Basic Metabolic Panel:  Recent Labs Lab 10/30/15 0353 10/31/15 0517 10/31/15 2013 11/03/15 0415 11/04/15 2236  NA 140  --   --  138 138  K 3.1*  --  3.1* 3.2* 3.9  CL 106  --   --  103 100*  CO2 25  --   --  25 25  GLUCOSE 305*  --   --  108* 121*  BUN 19  --   --  7 18  CREATININE 0.55  --   --  0.47 0.65  CALCIUM 8.9  --   --  9.2 10.2  MG  --  1.3*  --   --   --    Liver Function Tests:  Recent Labs Lab 11/04/15 2236  AST 84*  ALT 122*  ALKPHOS 92  BILITOT 0.7  PROT 6.7  ALBUMIN 2.8*   No results for input(s): LIPASE, AMYLASE in the last 168 hours. No results for input(s): AMMONIA in the last 168 hours. CBC:  Recent Labs Lab 11/04/15 2236  WBC 6.6  NEUTROABS 3.9  HGB 12.1  HCT 35.6*  MCV 92.7  PLT 269    No results found for this or any previous visit (from the past 240 hour(s)).   Studies: Dg Chest Port 1 View  11/05/2015  CLINICAL DATA:  Fever, seizure activity. EXAM: PORTABLE CHEST 1 VIEW COMPARISON:  Portable chest x-ray of November 04, 2015 FINDINGS: The lungs are better inflated today. There is persistent hazy density in the right infrahilar region. No air bronchograms are observed. There is no pleural effusion or pneumothorax. The left lung is clear. The heart and pulmonary vascularity are  normal. The bony thorax is unremarkable. IMPRESSION: Minimal atelectasis or early pneumonia in the right infrahilar region. A follow-up PA and lateral chest x-ray would be useful if the patient's symptoms warrant this. Electronically Signed   By: David  Martinique M.D.   On: 11/05/2015 09:48   Dg Chest Port 1 View  11/04/2015  CLINICAL DATA:  Seizures. Coarse breath sounds. Fever. Altered mental status. EXAM: PORTABLE CHEST 1 VIEW COMPARISON:  10/23/2015 FINDINGS: Interval removal of endotracheal tube, enteric tube, and right central venous catheters. Shallow  inspiration with slight atelectasis in the lung bases. Normal heart size and pulmonary vascularity. No focal consolidation in the lungs. No blunting of costophrenic angles. No pneumothorax. Mediastinal contours appear intact. IMPRESSION: Shallow inspiration with slight atelectasis in the lung bases. Electronically Signed   By: Lucienne Capers M.D.   On: 11/04/2015 23:58    Scheduled Meds: . lacosamide (VIMPAT) IV  100 mg Intravenous Q12H   Continuous Infusions: . dextrose 5 % and 0.45 % NaCl with KCl 20 mEq/L    . levETIRAcetam Stopped (11/05/15 1350)  . metronidazole    . [START ON 11/06/2015] vancomycin    . vancomycin      Principal Problem:   Seizures (HCC) Active Problems:   Recurrent falls   Polypharmacy   Parkinson's disease (HCC)   OSA (obstructive sleep apnea)   Depression   TIA (transient ischemic attack)   CVA (cerebral infarction)   Essential hypertension    Time spent: 30 min    Ramiro Pangilinan, [place] Pager [phone]. If 7PM-7AM, please contact night-coverage at [email or web address], password [facility] 11/05/2015, 5:08 PM  LOS: 0 days

## 2015-11-05 NOTE — ED Notes (Signed)
EEG at bedside.

## 2015-11-06 LAB — BASIC METABOLIC PANEL
Anion gap: 12 (ref 5–15)
BUN: 10 mg/dL (ref 6–20)
CHLORIDE: 101 mmol/L (ref 101–111)
CO2: 24 mmol/L (ref 22–32)
CREATININE: 0.38 mg/dL — AB (ref 0.44–1.00)
Calcium: 9.2 mg/dL (ref 8.9–10.3)
GFR calc Af Amer: >60 mL/min (ref >60)
GFR calc non Af Amer: >60 mL/min (ref >60)
GLUCOSE: 139 mg/dL — AB (ref 65–99)
POTASSIUM: 3 mmol/L — AB (ref 3.5–5.1)
Sodium: 137 mmol/L (ref 135–145)

## 2015-11-06 LAB — CBC
HEMATOCRIT: 28.2 % — AB (ref 36.0–46.0)
Hemoglobin: 9.6 g/dL — ABNORMAL LOW (ref 12.0–15.0)
MCH: 31.5 pg (ref 26.0–34.0)
MCHC: 34 g/dL (ref 30.0–36.0)
MCV: 92.5 fL (ref 78.0–100.0)
Platelets: 232 10*3/uL (ref 150–400)
RBC: 3.05 MIL/uL — ABNORMAL LOW (ref 3.87–5.11)
RDW: 14.7 % (ref 11.5–15.5)
WBC: 7.8 10*3/uL (ref 4.0–10.5)

## 2015-11-06 LAB — HIV ANTIBODY (ROUTINE TESTING W REFLEX): HIV Screen 4th Generation wRfx: NONREACTIVE

## 2015-11-06 MED ORDER — PHENYTOIN SODIUM 50 MG/ML IJ SOLN
100.0000 mg | Freq: Three times a day (TID) | INTRAMUSCULAR | Status: DC
  Administered 2015-11-06 – 2015-11-13 (×22): 100 mg via INTRAVENOUS
  Filled 2015-11-06 (×26): qty 2

## 2015-11-06 MED ORDER — PNEUMOCOCCAL VAC POLYVALENT 25 MCG/0.5ML IJ INJ: 0.5000 mL | INJECTION | INTRAMUSCULAR | Status: DC | PRN

## 2015-11-06 MED ORDER — SODIUM CHLORIDE 0.9 % IV SOLN
1000.0000 mg | Freq: Once | INTRAVENOUS | Status: AC
  Administered 2015-11-06: 1000 mg via INTRAVENOUS
  Filled 2015-11-06: qty 20

## 2015-11-06 NOTE — Progress Notes (Signed)
Subjective: Patient remains obtunded and minimally responsive to noxious stimuli. Facial twitching has been noted which seems to vary in intensity from time to time. No frank clonic or tonic activity involving the extremities noted. Vimpat was increased to 200 mg every 12 hours IV.  Objective: Current vital signs: BP 146/50 mmHg  Pulse 78  Temp(Src) 97.8 F (36.6 C) (Oral)  Resp 15  SpO2 100%  Neurologic Exam: Patient showed minimal facial grimace to noxious stimulus. She did not open her eyes at all. Pupils were equal and reacted sluggishly to light. Extraocular movements were intact with oculocephalic maneuvers. Patient appeared to have chewing movements of her eyes conjugately and simultaneously with facial twitches. Face was symmetrical. Facial twitching appeared to be somewhat more pronounced on the right side compared to left. Muscle tone was flaccid throughout. There was no abnormal posturing. She had no spontaneous movements of extremities. Deep tendon reflexes were 1+ and symmetrical. Plantar responses were mute.  EEG activity consisted of moderately severe background slowing with periodic bilateral sharp wave discharges occurring about every 2 seconds, likely PLEDs, but cannot rule out epileptiform activity.  Medications: I have reviewed the patient's current medications.  Assessment/Plan: Patient continues to be obtunded with no clear frank seizure activity clinically. However, facial twitches and I movements may be manifestations of continued partial epileptic activity.  I will and Dilantin 1000 mg IV loading dose followed by 100 mg every 8 hours. Keppra will be continued at 1500 mg every 12 hours and Vimpat 200 mg every 12 hours. CT scan of her head is been ordered.  We will continue to follow this patient with you.  C.R. Nicole Kindred, MD [facility] [phone]  11/06/2015  8:44 AM

## 2015-11-06 NOTE — Procedures (Addendum)
  Electroencephalogram report- LTM   Ordering Physician :    Data acquisition: 10-20 electrode placement.  Additional T1, T2, and EKG electrodes; 26 channel digital referential acquisition reformatted to 18 channel/7 channel coronal bipolar      Beginning time: 11/05/15 at 10;34:05 am Ending time:11/06/15 8:55;12 am  Day of study: day 1    This 24 hours of intensive EEG monitoring with simultaneous video monitoring was performed for this patient with unresponsiveness and facial twitching  as a part of ongoing series to capture events of interest and determine if these are seizures.    Medications: as per EMR  There was no pushbutton activations events during this recording. EEG technologist noted facial twitching without specific lateralization. Due to sub optimal video unable to characterize further.   Background activities are marked by reactive background slowing 2-3 cps delta with superimposed 5-6 cps theta distributed broadly.   Superimposed non remitting left hemispheric PLEDS were present with maximum negativity in the left posterotemporal/parietal cortex.  Superimposed there is near continuous muscle twitching artifact was present obscuring EEG tracing. Muscle twitching artifact is the most prominent in the left frontal/fronto polar heels. Less prominent right fronto polar muscle artifact was also present.    Clinical interpretation: This 24 hours of intensive EEG monitoring with simultaneous monitoring is abnormal due to left hemispheric PLEDS suggestive of cortical irritability in the left hemisphere particularly left temporo-parietal cortex. 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 Continuous monitoring is recommended to insure the resolution of left hemispheric PLEDs.

## 2015-11-06 NOTE — Progress Notes (Signed)
EEG maintenance. Repositioned, reprepped, and reglued most electrodes. FZ replaced as lead was bad. No skin breakdown noted.

## 2015-11-06 NOTE — Progress Notes (Signed)
SLP Cancellation Note  Patient Details Name: Kathryn [name] MRN: [id number] DOB: 09-09-1930   Cancelled treatment:        Pt unresponsive. SLP will f/u next date.   Titus Mould 11/06/2015, 8:15 AM

## 2015-11-06 NOTE — Progress Notes (Signed)
Initial Nutrition Assessment  DOCUMENTATION CODES:   Not applicable  INTERVENTION:   -RD will follow for diet advancement and goals of care  NUTRITION DIAGNOSIS:   Inadequate oral intake related to inability to eat as evidenced by NPO status.  GOAL:   Patient will meet greater than or equal to 90% of their needs  MONITOR:   Diet advancement, Labs, Weight trends, Skin, I & O's  REASON FOR ASSESSMENT:   Low Braden    ASSESSMENT:      Pt admitted with obtundation related to seizures.   Pt unresponsive at time of visit. No family at bedside to provide hx.  Pt underwent EEG on 11/06/15 which revealed left hemispheric PLEDS suggestive of cortical irritability in the left hemisphere particularly left temporo-parietal cortex.  Per chart review, pt was receiving a dysphagia 1 diet with honey thick liquids during recent hospital admission.   Nutrition-Focused physical exam completed. Findings are no fat depletion, no muscle depletion, and mild edema.   Case discussed with RN. She confirms that pt remains unresponsive. Per her report, pt family to come later today to further discuss goals of care.   Labs reviewed.   Diet Order:  Diet NPO time specified  Skin:  Reviewed, no issues  Last BM:  PTA  Height:   Ht Readings from Last 1 Encounters:  10/21/15 5\' 3"  (1.6 m)    Weight:   Wt Readings from Last 1 Encounters:  11/03/15 123 lb 12.8 oz (56.155 kg)    Ideal Body Weight:  52.3 kg  BMI:  Estimated body mass index is 21.94 kg/(m^2) as calculated from the following:   Height as of 10/21/15: 5\' 3"  (1.6 m).   Weight as of 10/08/15: 123 lb 12.8 oz (56.155 kg).  Estimated Nutritional Needs:   Kcal:  1400-1600  Protein:  60-70 grams  Fluid:  1.4-1.6 L  EDUCATION NEEDS:   No education needs identified at this time  Tegan Britain A. Jimmye Norman, RD, LDN, CDE Pager: [phone] After hours Pager: [phone]

## 2015-11-06 NOTE — Progress Notes (Signed)
[facility] PROGRESS NOTE  Kathryn [name] [id number] DOB: 07/11/1931 DOA: 11/04/2015 PCP: Lilian Coma, MD  82 y/o with history of depression, TIA, CVA, Parkinson's disease, seizure d/o, HTN, OSA, hypothyroidism, Polypharmacy. Patient was brought in by EMS from [facility] due to prolonged seizure activity.  Assessment/Plan: Seizure disorder -Neurology on board and managing  Parkinson's disease -Patient's MAR does not reflect treatment for Parkinson's.  TIA/CVA -MRI just prior to patient's discharge on 3/3 shows old lacunar infarcts. No findings to suggest cause of new-onset seizure  Depression -Patient's not receiving treatment.  OSA -CPAP per respiratory  Recurrent falls -PT/OT evaluate at appropriate time. Patient may need to consider assisted living facility/SNF  Polypharmacy  UTI? -Urine culture pending, patient currently on fortaz and vancomycin  Code Status: dnr Family Communication: None at bedside Disposition Plan: Pending final recommendations from neurologist   Consultants:  Neurology  Procedures:  None  Antibiotics:  As listed above  HPI/Subjective: Patient does not respond to examiner  Objective: Filed Vitals:   11/06/15 1100 11/06/15 1116  BP: 135/52 121/49  Pulse: 85 76  Temp:  98.5 F (36.9 C)  Resp:  16    Intake/Output Summary (Last 24 hours) at 11/06/15 1632 Last data filed at 11/06/15 1400  Gross per 24 hour  Intake 2663.33 ml  Output   3025 ml  Net -361.67 ml   There were no vitals filed for this visit.  Exam:   General:  Patient in no acute distress, fixed gaze and does not respond to examiner  Cardiovascular: S1 and S2 within normal limits no rubs  Respiratory: No increased work of breathing, no wheezes  Abdomen: Soft, nondistended, nontender  Musculoskeletal: No cyanosis or clubbing   Data Reviewed: Basic Metabolic Panel:  Recent Labs Lab 10/31/15 0517 10/31/15 2013 11/03/15 0415 11/04/15 2236  11/05/15 1746 11/06/15 0600  NA  --   --  138 138 141 137  K  --  3.1* 3.2* 3.9 3.8 3.0*  CL  --   --  103 100* 103 101  CO2  --   --  25 25 24 24   GLUCOSE  --   --  108* 121* 126* 139*  BUN  --   --  7 18 16 10   CREATININE  --   --  0.47 0.65 0.62 0.38*  CALCIUM  --   --  9.2 10.2 10.0 9.2  MG 1.3*  --   --   --   --   --    Liver Function Tests:  Recent Labs Lab 11/04/15 2236 11/05/15 1746  AST 84* 55*  ALT 122* 100*  ALKPHOS 92 92  BILITOT 0.7 0.6  PROT 6.7 6.5  ALBUMIN 2.8* 2.7*   No results for input(s): LIPASE, AMYLASE in the last 168 hours. No results for input(s): AMMONIA in the last 168 hours. CBC:  Recent Labs Lab 11/04/15 2236 11/05/15 1746 11/06/15 0600  WBC 6.6 9.0 7.8  NEUTROABS 3.9 6.1  --   HGB 12.1 11.5* 9.6*  HCT 35.6* 35.1* 28.2*  MCV 92.7 93.6 92.5  PLT 269 267 232   Cardiac Enzymes: No results for input(s): CKTOTAL, CKMB, CKMBINDEX, TROPONINI in the last 168 hours. BNP (last 3 results)  Recent Labs  10/09/15 0228  BNP 64.5    ProBNP (last 3 results) No results for input(s): PROBNP in the last 8760 hours.  CBG:  Recent Labs Lab 11/02/15 1627 11/02/15 2138 11/03/15 0632 11/03/15 1139 11/03/15 1624  GLUCAP 112* 123* 109* 121*  107*    Recent Results (from the past 240 hour(s))  Culture, Urine     Status: None (Preliminary result)   Collection Time: 11/05/15 12:05 AM  Result Value Ref Range Status   Specimen Description Urine  Final   Special Requests NONE  Final   Culture 70,000 COLONIES/ml ENTEROCOCCUS SPECIES  Final   Report Status PENDING  Incomplete  Culture, blood (routine x 2)     Status: None (Preliminary result)   Collection Time: 11/05/15  9:50 AM  Result Value Ref Range Status   Specimen Description BLOOD RIGHT HAND  Final   Special Requests IN PEDIATRIC BOTTLE 1CC  Final   Culture NO GROWTH 1 DAY  Final   Report Status PENDING  Incomplete  Culture, blood (routine x 2)     Status: None (Preliminary result)    Collection Time: 11/05/15  9:53 AM  Result Value Ref Range Status   Specimen Description BLOOD RIGHT ANTECUBITAL  Final   Special Requests BOTTLES DRAWN AEROBIC AND ANAEROBIC 5CC  Final   Culture NO GROWTH 1 DAY  Final   Report Status PENDING  Incomplete     Studies: Dg Chest Port 1 View  11/05/2015  CLINICAL DATA:  Fever, seizure activity. EXAM: PORTABLE CHEST 1 VIEW COMPARISON:  Portable chest x-ray of November 04, 2015 FINDINGS: The lungs are better inflated today. There is persistent hazy density in the right infrahilar region. No air bronchograms are observed. There is no pleural effusion or pneumothorax. The left lung is clear. The heart and pulmonary vascularity are normal. The bony thorax is unremarkable. IMPRESSION: Minimal atelectasis or early pneumonia in the right infrahilar region. A follow-up PA and lateral chest x-ray would be useful if the patient's symptoms warrant this. Electronically Signed   By: David  Martinique M.D.   On: 11/05/2015 09:48   Dg Chest Port 1 View  11/04/2015  CLINICAL DATA:  Seizures. Coarse breath sounds. Fever. Altered mental status. EXAM: PORTABLE CHEST 1 VIEW COMPARISON:  10/23/2015 FINDINGS: Interval removal of endotracheal tube, enteric tube, and right central venous catheters. Shallow inspiration with slight atelectasis in the lung bases. Normal heart size and pulmonary vascularity. No focal consolidation in the lungs. No blunting of costophrenic angles. No pneumothorax. Mediastinal contours appear intact. IMPRESSION: Shallow inspiration with slight atelectasis in the lung bases. Electronically Signed   By: Lucienne Capers M.D.   On: 11/04/2015 23:58    Scheduled Meds: . antiseptic oral rinse  7 mL Mouth Rinse BID  . cefTAZidime (FORTAZ)  IV  2 g Intravenous Q12H  . enoxaparin (LOVENOX) injection  40 mg Subcutaneous QHS  . lacosamide (VIMPAT) IV  200 mg Intravenous Q12H  . levETIRAcetam  1,500 mg Intravenous Q12H  . phenytoin (DILANTIN) IV  100 mg Intravenous  3 times per day  . vancomycin  500 mg Intravenous Q12H   Continuous Infusions: . dextrose 5 % and 0.45 % NaCl with KCl 20 mEq/L 100 mL/hr at 11/06/15 0643   Time spent: > 35 minutes  Velvet Bathe  [facility] Pager [phone]. If 7PM-7AM, please contact night-coverage at [email or web address], password [facility] 11/06/2015, 4:32 PM  LOS: 1 day

## 2015-11-07 DIAGNOSIS — N390 Urinary tract infection, site not specified: Secondary | ICD-10-CM | POA: Insufficient documentation

## 2015-11-07 LAB — CBC
HEMATOCRIT: 29.8 % — AB (ref 36.0–46.0)
Hemoglobin: 9.7 g/dL — ABNORMAL LOW (ref 12.0–15.0)
MCH: 29.8 pg (ref 26.0–34.0)
MCHC: 32.6 g/dL (ref 30.0–36.0)
MCV: 91.7 fL (ref 78.0–100.0)
PLATELETS: 244 10*3/uL (ref 150–400)
RBC: 3.25 MIL/uL — ABNORMAL LOW (ref 3.87–5.11)
RDW: 14.7 % (ref 11.5–15.5)
WBC: 6.2 10*3/uL (ref 4.0–10.5)

## 2015-11-07 LAB — BASIC METABOLIC PANEL
Anion gap: 9 (ref 5–15)
BUN: 8 mg/dL (ref 6–20)
CHLORIDE: 102 mmol/L (ref 101–111)
CO2: 23 mmol/L (ref 22–32)
Calcium: 8.5 mg/dL — ABNORMAL LOW (ref 8.9–10.3)
Creatinine, Ser: 0.42 mg/dL — ABNORMAL LOW (ref 0.44–1.00)
GFR calc Af Amer: >60 mL/min (ref >60)
GFR calc non Af Amer: >60 mL/min (ref >60)
GLUCOSE: 159 mg/dL — AB (ref 65–99)
POTASSIUM: 3.2 mmol/L — AB (ref 3.5–5.1)
Sodium: 134 mmol/L — ABNORMAL LOW (ref 135–145)

## 2015-11-07 LAB — URINE CULTURE: Culture: 70000

## 2015-11-07 LAB — PHENYTOIN LEVEL, TOTAL: Phenytoin Lvl: 15.8 ug/mL (ref 10.0–20.0)

## 2015-11-07 LAB — MAGNESIUM: MAGNESIUM: 1.3 mg/dL — AB (ref 1.7–2.4)

## 2015-11-07 MED ORDER — HYDRALAZINE HCL 20 MG/ML IJ SOLN
10.0000 mg | Freq: Four times a day (QID) | INTRAMUSCULAR | Status: DC | PRN
Start: 2015-11-07 — End: 2015-11-28
  Administered 2015-11-07 – 2015-11-27 (×15): 10 mg via INTRAVENOUS
  Filled 2015-11-07 (×15): qty 1

## 2015-11-07 MED ORDER — MAGNESIUM SULFATE 4 GM/100ML IV SOLN
4.0000 g | Freq: Once | INTRAVENOUS | Status: AC
  Administered 2015-11-07: 4 g via INTRAVENOUS
  Filled 2015-11-07: qty 100

## 2015-11-07 MED ORDER — POTASSIUM CHLORIDE 10 MEQ/100ML IV SOLN
10.0000 meq | INTRAVENOUS | Status: AC
  Administered 2015-11-07 (×3): 10 meq via INTRAVENOUS
  Filled 2015-11-07 (×3): qty 100

## 2015-11-07 NOTE — Progress Notes (Signed)
CONSULT NOTE - INITIAL  Pharmacy Consult for phenytoin Indication: seizures  Allergies  Allergen Reactions  . Augmentin [Amoxicillin-Pot Clavulanate] Swelling and Diarrhea    Throat   . Citalopram Swelling    Eyes ears and throat swelling Throat and eyes   . Codeine Anaphylaxis and Shortness Of Breath  . Fish-Derived Products Anaphylaxis  . Hyoscyamine Anaphylaxis and Swelling  . Ibuprofen Swelling    Throat and eyes   . Latex Swelling and Rash    Swelling to troat  . Lipitor [Atorvastatin] Swelling    Muscle aches throat  . Septra [Bactrim] Anaphylaxis  . Shellfish Allergy Anaphylaxis  . Miconazole Rash  . Keflet [Cephalexin] Diarrhea    Upset stomach  . Meloxicam Diarrhea and Nausea And Vomiting  . Verapamil Other (See Comments)    Tachycardia and flushing  . Vicodin [Hydrocodone-Acetaminophen] Other (See Comments)    stroke  . Zoloft [Sertraline Hcl] Swelling    Red spots all over face, swelling of tongue and legs.   . Ciprofloxacin Nausea And Vomiting    Other reaction(s): Abdominal Pain  . Depakene [Valproate Sodium] Hives    All over body   . Isoptin Sr [Verapamil Hcl Er] Cough  . Lisinopril Cough    Fatigue and cough  . Sulfa Antibiotics Rash    Rash and also throat was tight  . Telmisartan-Hctz Cough    Vital Signs: Temp: 98.1 F (36.7 C) (03/08 0840) Temp Source: Oral (03/08 0840) BP: 175/69 mmHg (03/08 0400) Pulse Rate: 100 (03/08 0400) Intake/Output from previous day: 03/07 0701 - 03/08 0700 In: 3505 [P.O.:15; I.V.:2500; IV Piggyback:990] Out: 2350 [Urine:2350] Intake/Output from this shift: Total I/O In: 200 [IV Piggyback:200] Out: -   Labs:  Recent Labs  11/05/15 1746 11/06/15 0600 11/07/15 0300  WBC 9.0 7.8 6.2  HGB 11.5* 9.6* 9.7*  PLT 267 232 244  CREATININE 0.62 0.38* 0.42*   Estimated Creatinine Clearance: 44.1 mL/min (by C-G formula based on Cr of 0.42). No results for input(s): VANCOTROUGH, VANCOPEAK, VANCORANDOM,  GENTTROUGH, GENTPEAK, GENTRANDOM, TOBRATROUGH, TOBRAPEAK, TOBRARND, AMIKACINPEAK, AMIKACINTROU, AMIKACIN in the last 72 hours.   Microbiology: Recent Results (from the past 720 hour(s))  Urine culture     Status: None   Collection Time: 10/09/15  1:43 AM  Result Value Ref Range Status   Specimen Description URINE, CATHETERIZED  Final   Special Requests NONE  Final   Culture   Final    >=100,000 COLONIES/mL DIPHTHEROIDS(CORYNEBACTERIUM SPECIES) Standardized susceptibility testing for this organism is not available.    Report Status 10/10/2015 FINAL  Final  Culture, blood (Routine X 2) w Reflex to ID Panel     Status: None   Collection Time: 10/09/15  5:30 AM  Result Value Ref Range Status   Specimen Description BLOOD LEFT HAND  Final   Special Requests IN PEDIATRIC BOTTLE 1CC  Final   Culture NO GROWTH 5 DAYS  Final   Report Status 10/14/2015 FINAL  Final  Culture, blood (Routine X 2) w Reflex to ID Panel     Status: None   Collection Time: 10/09/15  5:38 AM  Result Value Ref Range Status   Specimen Description BLOOD LEFT ARM  Final   Special Requests IN PEDIATRIC BOTTLE [place]  Final   Culture NO GROWTH 5 DAYS  Final   Report Status 10/14/2015 FINAL  Final  MRSA PCR Screening     Status: None   Collection Time: 10/10/15 11:39 PM  Result Value Ref Range Status  MRSA by PCR NEGATIVE NEGATIVE Final    Comment:        The GeneXpert MRSA Assay (FDA approved for NASAL specimens only), is one component of a comprehensive MRSA colonization surveillance program. It is not intended to diagnose MRSA infection nor to guide or monitor treatment for MRSA infections.   CSF culture     Status: None   Collection Time: 10/11/15  1:33 PM  Result Value Ref Range Status   Specimen Description CSF  Final   Special Requests CSF NO 2  Final   Gram Stain   Final    CYTOSPIN SMEAR WBC PRESENT, PREDOMINANTLY PMN NO ORGANISMS SEEN    Culture NO GROWTH 3 DAYS  Final   Report Status 10/14/2015  FINAL  Final  Culture, respiratory (NON-Expectorated)     Status: None   Collection Time: 10/13/15  9:30 AM  Result Value Ref Range Status   Specimen Description ENDOTRACHEAL  Final   Special Requests NONE  Final   Gram Stain   Final    FEW WBC PRESENT,BOTH PMN AND MONONUCLEAR RARE SQUAMOUS EPITHELIAL CELLS PRESENT NO ORGANISMS SEEN Performed at [facility]    Culture   Final    FEW YEAST CONSISTENT WITH CANDIDA SPECIES Performed at [facility]    Report Status 10/16/2015 FINAL  Final  Culture, Urine     Status: None   Collection Time: 11/05/15 12:05 AM  Result Value Ref Range Status   Specimen Description Urine  Final   Special Requests NONE  Final   Culture 70,000 COLONIES/ml ENTEROCOCCUS SPECIES  Final   Report Status 11/07/2015 FINAL  Final   Organism ID, Bacteria ENTEROCOCCUS SPECIES  Final      Susceptibility   Enterococcus species - MIC*    AMPICILLIN <=2 SENSITIVE Sensitive     VANCOMYCIN 1 SENSITIVE Sensitive     GENTAMICIN SYNERGY SENSITIVE Sensitive     * 70,000 COLONIES/ml ENTEROCOCCUS SPECIES  Culture, blood (routine x 2)     Status: None (Preliminary result)   Collection Time: 11/05/15  9:50 AM  Result Value Ref Range Status   Specimen Description BLOOD RIGHT HAND  Final   Special Requests IN PEDIATRIC BOTTLE 1CC  Final   Culture NO GROWTH 1 DAY  Final   Report Status PENDING  Incomplete  Culture, blood (routine x 2)     Status: None (Preliminary result)   Collection Time: 11/05/15  9:53 AM  Result Value Ref Range Status   Specimen Description BLOOD RIGHT ANTECUBITAL  Final   Special Requests BOTTLES DRAWN AEROBIC AND ANAEROBIC 5CC  Final   Culture NO GROWTH 1 DAY  Final   Report Status PENDING  Incomplete    Medical History: Past Medical History  Diagnosis Date  . Depression   . Hypertension   . High cholesterol   . Rectal prolapse   . Hx of adenomatous colonic polyps   . Internal hemorrhoids   . Hypothyroidism   . Fecal  incontinence   . TIA (transient ischemic attack)   . History of frequent urinary tract infections     recent  . Migraine   . Fatty liver 03/20/13  . Generalized ischemic cerebrovascular disease 10/09/2015  . Stroke [facility]) Sept 1, 2010; Sept 12, 2011    Medications:  Scheduled:  . antiseptic oral rinse  7 mL Mouth Rinse BID  . cefTAZidime (FORTAZ)  IV  2 g Intravenous Q12H  . enoxaparin (LOVENOX) injection  40 mg Subcutaneous QHS  .  lacosamide (VIMPAT) IV  200 mg Intravenous Q12H  . levETIRAcetam  1,500 mg Intravenous Q12H  . phenytoin (DILANTIN) IV  100 mg Intravenous 3 times per day  . vancomycin  500 mg Intravenous Q12H    Assessment: 82 yo female here with seizures on keppra, lacosamide and phenytoin. She was recently admitted for status epilepticus and discharged 3/4 on keppra.  Pharmacy has been consulted to dose phenytoin (started 3/7) -Phenytoin level= 15.8 (albumin= 2.7, corrected= 24) collected about 2 hours post dose -per MD noted there has been no re-occurence of seizures  Goal of Therapy:  phenytoin level= 10-20  Plan:  -Continue phenytoin 100mg  IV q8h -Will recheck a phenytoin level in 5-7 days after intiation (~ 3/13-3/14)  Hildred Laser, Pharm D 11/07/2015 11:09 AM

## 2015-11-07 NOTE — Progress Notes (Deleted)
Speech Language Pathology Treatment: Dysphagia  Patient Details Name: HAWO POKORNY MRN: [id number] DOB: [birth year] Today's Date: 11/07/2015 Time: [id number] SLP Time Calculation (min) (ACUTE ONLY): 8 min  Assessment / Plan / Recommendation Clinical Impression  Pt awake, eyes open and did not follow commands given max multimodal cues for verbal or motor abilities. Tracking SLP on her left side. Pursed lips during attempts for oral care and applesauce. ST will follow for improvements. Consider alternative means nutrition.     HPI HPI: 82 y.o. female with h/o HTN, TIA, CVA (2010, 2011), Parkinson's disease, seizures and frequent falls, who presented to ED on 3/5 in postictal state with R side gaze, R side facial twitch and R side twitching to R hand. Pt recently d/c 3/4 for acute encephalopathy/status epilecpticus (month-long stay). Per MD note, family reports speech/mental status mildly returned to baseline. MR Brain 3/3 findings compatible with resolution of previously noted status epilepticus. CXR 3/6 minimal atelectasis or early pneumonia in R infrahilar region. MBS 3/1 recommended Dysphagia 1 (puree) diet with honey thick liquids and meds crushed in puree.       SLP Plan  Continue with current plan of care     Recommendations  Diet recommendations: NPO             Oral Care Recommendations: Oral care QID Follow up Recommendations: Skilled Nursing facility Plan: Continue with current plan of care     GO                Houston Siren 11/07/2015, 10:58 AM  Orbie Pyo Colvin Caroli.Ed [employer] [phone]

## 2015-11-07 NOTE — Evaluation (Signed)
Clinical/Bedside Swallow Evaluation Patient Details  Name: Kathryn [name] MRN: [id number] Date of Birth: 1931-08-21  Today's Date: 11/07/2015 Time: SLP Start Time (ACUTE ONLY): [id number] SLP Stop Time (ACUTE ONLY): 1055 SLP Time Calculation (min) (ACUTE ONLY): 8 min  Past Medical History:  Past Medical History  Diagnosis Date  . Depression   . Hypertension   . High cholesterol   . Rectal prolapse   . Hx of adenomatous colonic polyps   . Internal hemorrhoids   . Hypothyroidism   . Fecal incontinence   . TIA (transient ischemic attack)   . History of frequent urinary tract infections     recent  . Migraine   . Fatty liver 03/20/13  . Generalized ischemic cerebrovascular disease 10/09/2015  . Stroke [facility]) Sept 1, 2010; Sept 12, 2011   Past Surgical History:  Past Surgical History  Procedure Laterality Date  . Colon surgery  2010, for prolapsed organs after hystecrtomy surgery  . Appendectomy  2008  . Cholecystectomy    . Lumbar laminectomy/decompression microdiscectomy N/A 01/07/2013    Procedure: LUMBAR LAMINECTOMY CENTRAL DECOMPRESSION L4-L5, BILATERAL FORAMENOTOMY L4,L5    ;  Surgeon: Tobi Bastos, MD;  Location: [facility] ORS;  Service: Orthopedics;  Laterality: N/A;  . Back surgery    . Hemorrhoid surgery  09/2008    Archie Endo 12/31/2010  . Abdominal hysterectomy  05/2007    /notes 01/02/2011   HPI:  82 y.o. female with h/o HTN, TIA, CVA (2010, 2011), Parkinson's disease, seizures and frequent falls, who presented to ED on 3/5 in postictal state with R side gaze, R side facial twitch and R side twitching to R hand. Pt recently d/c 3/4 for acute encephalopathy/status epilecpticus (month-long stay). Per MD note, family reports speech/mental status mildly returned to baseline. MR Brain 3/3 findings compatible with resolution of previously noted status epilepticus. CXR 3/6 minimal atelectasis or early pneumonia in R infrahilar region. MBS 3/1 recommended Dysphagia 1 (puree) diet with honey thick  liquids and meds crushed in puree.    Assessment / Plan / Recommendation Clinical Impression  SLP familiar with pt from previous recent admission. Pt awake, eyes open and did not follow commands given max multimodal cues for verbal or motor abilities. Tracking SLP on her left side. Pursed lips during attempts for oral care and applesauce. ST will follow for improvements. Consider alternative means nutrition if unable to safely consume po's in next several days.     Aspiration Risk  Severe aspiration risk    Diet Recommendation NPO   Medication Administration: Via alternative means    Other  Recommendations Oral Care Recommendations: Oral care QID   Follow up Recommendations  Skilled Nursing facility    Frequency and Duration min 2x/week  2 weeks       Prognosis Prognosis for Safe Diet Advancement: Fair      Swallow Study   General HPI: 82 y.o. female with h/o HTN, TIA, CVA (2010, 2011), Parkinson's disease, seizures and frequent falls, who presented to ED on 3/5 in postictal state with R side gaze, R side facial twitch and R side twitching to R hand. Pt recently d/c 3/4 for acute encephalopathy/status epilecpticus (month-long stay). Per MD note, family reports speech/mental status mildly returned to baseline. MR Brain 3/3 findings compatible with resolution of previously noted status epilepticus. CXR 3/6 minimal atelectasis or early pneumonia in R infrahilar region. MBS 3/1 recommended Dysphagia 1 (puree) diet with honey thick liquids and meds crushed in puree.  Type of Study: Bedside  Swallow Evaluation Previous Swallow Assessment: see HPI Diet Prior to this Study: NPO Temperature Spikes Noted: No Respiratory Status: Nasal cannula History of Recent Intubation: Yes (previous admission in Feb) Length of Intubations (days): 13 days Date extubated: 10/25/15 Behavior/Cognition:  (awake, eyes open, blank stare) Oral Cavity Assessment:  (unable to assess, lips dry) Oral Care  Completed by SLP:  (attempted) Oral Cavity - Dentition: Adequate natural dentition (partials) Vision: Impaired for self-feeding Self-Feeding Abilities: Total assist Patient Positioning: Upright in bed Baseline Vocal Quality:  (no vocalizations) Volitional Cough: Cognitively unable to elicit Volitional Swallow: Unable to elicit    Oral/Motor/Sensory Function Overall Oral Motor/Sensory Function:  (did not follow commands)   Ice Chips Ice chips: Not tested   Thin Liquid Thin Liquid: Not tested    Nectar Thick Nectar Thick Liquid: Not tested   Honey Thick Honey Thick Liquid: Not tested   Puree Puree:  (pt refused, pursed lips) Presentation: Spoon   Solid   GO   Solid: Not tested        Houston Siren 11/07/2015,11:16 AM   Orbie Pyo Colvin Caroli[employer] [phone]

## 2015-11-07 NOTE — Procedures (Signed)
  Electroencephalogram report- LTM   Ordering Physician : Dr Nicole Kindred   Data acquisition: 10-20 electrode placement.  Additional T1, T2, and EKG electrodes; 26 channel digital referential acquisition reformatted to 18 channel/7 channel coronal bipolar     Beginning time: 11/05/15 at 10;34:05 am Ending time:11/07/15 07:34:23 am  Day of study: day 1 , day 2     This 24 hours  intensive EEG monitoring with simultaneous video monitoring was performed for this patient with unresponsiveness and facial twitching  as a part of ongoing series to capture events of interest and determine if these are seizures.    Medications: as per EMR  There was no pushbutton activations events during this recording. EEG technologist noted facial twitching without specific lateralization. Due to sub optimal video unable to characterize further.   Day 1: Background activities are marked by reactive background slowing 2-3 cps delta with superimposed 5-6 cps theta distributed broadly.  Superimposed non remitting left hemispheric PLEDS were present with maximum negativity in the left posterotemporal/parietal cortex.  Superimposed there is near continuous muscle twitching artifact was present obscuring EEG tracing. Muscle twitching artifact is the most prominent in the left frontal/fronto polar heels. Less prominent right fronto polar muscle artifact was also present.   Day 2: Continuous  left hemispheric PLEDS were present with maximum negativity in the left posterotemporal/parietal cortex, T5.  Since 4 pm left periodic lateralized epileptiform discharges PLEDs  become more organized , rhythmic,  synchronized with evolving features and much broader  negative field extending to the right hemisphere consistent with electrographic seizures and nonconvulsive status epilepticus.  Superimposed there is near continuous muscle twitching artifact was present and most likely attributable to the facial twitching.  Clinical  interpretation: This intensive EEG monitoring with simultaneous monitoring is abnormal due to left hemispheric PLEDS and worsening electrographic seizures during second day of monitoring suggestive of cortical irritability in the left hemisphere, consistent with nonconvulsive status epilepticus. Continuous monitoring is recommended to insure the resolution of left hemispheric PLEDs and NCSE.  This findings were discussed with ordering physician at the time of interpretation.  Clinical correlation is advised.

## 2015-11-07 NOTE — Progress Notes (Signed)
[facility] PROGRESS NOTE  Kathryn [name] [id number] DOB: 07/28/1931 DOA: 11/04/2015 PCP: Kathryn Coma, MD Brief narrative 77 old female with history of CVA, Parkinson's disease, seizure disorder,, hypertension, OSA, hypothyroidism and frequent falls, with recent prolonged hospitalization for acute encephalopathy with status epilepticus. She was on continuous ECG monitoring during the prior hospitalization. She was discharged to skilled nursing facility and as per family she was eating well and mental status was improving to baseline. At the facility patient had a focal seizures lasting for almost 10 minutes on the evening of admission. When EMS arrived patient had a right-sided gaze with right facial twitch and twitching of the right hand. She was given 5 mg of IV Versed and without with some improvement. Patient admitted to stepdown unit for recurrent encephalopathic state with status epilepticus. Placed on IV Vimpat and Keppra. Dilantin added on 3/6. Neurology consulted.  Assessment/Plan: Persistent encephalopathy with status epilepticus Neurology closely following. Patient placed on 24-hour intensive EEG monitoring with simultaneous video monitoring Shows left hemispheric PLEDS and worsening electrographic seizures on day 2 suggestive of non-: Impulsive status epilepticus. On IV Vimpat, Keppra and IV Dilantin. -Patient remains nonverbal response with eye opening only.. Further recommendations per neurology.   History of CVA/TIA Old lacunar infarct as per MRI done recently.  OSA Continue CPAP  UTI 70K colonies  of enterococcus. Has low-grade fever. We'll continue vancomycin for now.  Hypokalemia Replenish with IV KCl. Check magnesium level.  Parkinson's disease Pt was on amantadine prior to admission  Elevated blood pressure Will place on when necessary hydralazine.   DVT  prophylaxis: Subcutaneous Lovenox  Diet: Nothing by mouth  Code Status: DO NOT  RESUSCITATE Family Communication: None at bedside Disposition Plan: Continue step down monitoring   Consultants:  Neurology  Procedures:  Head CT  Continuous EEG monitoring  Antibiotics:  IV vancomycin and Fortaz since 3/6  HPI/Subjective: Seen and examined. Responds with eye opening only.  Objective: Filed Vitals:   11/07/15 0840 11/07/15 1110  BP: 164/117 180/106  Pulse: 95 103  Temp: 98.1 F (36.7 C)   Resp: 16 22    Intake/Output Summary (Last 24 hours) at 11/07/15 1151 Last data filed at 11/07/15 1100  Gross per 24 hour  Intake   2820 ml  Output   2300 ml  Net    520 ml   There were no vitals filed for this visit.  Exam:   General:  Elderly female lying in bed not in distress, responds with eye opening only  HEENT: No pallor, supple neck  Cardiovascular: Normal S1 and S2, no murmurs rub or gallop  Chest: Clear bilaterally  Abdomen: Soft, nondistended, nontender, bowel sounds present, Foley in place  Musculoskeletal : warm, no edema   CNS:, Nonverbal, responds eye opening only,No movement of LUE noted.  Data Reviewed: Basic Metabolic Panel:  Recent Labs Lab 11/03/15 0415 11/04/15 2236 11/05/15 1746 11/06/15 0600 11/07/15 0300  NA 138 138 141 137 134*  K 3.2* 3.9 3.8 3.0* 3.2*  CL 103 100* 103 101 102  CO2 25 25 24 24 23   GLUCOSE 108* 121* 126* 139* 159*  BUN 7 18 16 10 8   CREATININE 0.47 0.65 0.62 0.38* 0.42*  CALCIUM 9.2 10.2 10.0 9.2 8.5*   Liver Function Tests:  Recent Labs Lab 11/04/15 2236 11/05/15 1746  AST 84* 55*  ALT 122* 100*  ALKPHOS 92 92  BILITOT 0.7 0.6  PROT 6.7 6.5  ALBUMIN 2.8* 2.7*   No results for input(s):  LIPASE, AMYLASE in the last 168 hours. No results for input(s): AMMONIA in the last 168 hours. CBC:  Recent Labs Lab 11/04/15 2236 11/05/15 1746 11/06/15 0600 11/07/15 0300  WBC 6.6 9.0 7.8 6.2  NEUTROABS 3.9 6.1  --   --   HGB 12.1 11.5* 9.6* 9.7*  HCT 35.6* 35.1* 28.2* 29.8*  MCV 92.7  93.6 92.5 91.7  PLT 269 267 232 244   Cardiac Enzymes: No results for input(s): CKTOTAL, CKMB, CKMBINDEX, TROPONINI in the last 168 hours. BNP (last 3 results)  Recent Labs  10/09/15 0228  BNP 64.5    ProBNP (last 3 results) No results for input(s): PROBNP in the last 8760 hours.  CBG:  Recent Labs Lab 11/02/15 1627 11/02/15 2138 11/03/15 0632 11/03/15 1139 11/03/15 1624  GLUCAP 112* 123* 109* 121* 107*    Recent Results (from the past 240 hour(s))  Culture, Urine     Status: None   Collection Time: 11/05/15 12:05 AM  Result Value Ref Range Status   Specimen Description Urine  Final   Special Requests NONE  Final   Culture 70,000 COLONIES/ml ENTEROCOCCUS SPECIES  Final   Report Status 11/07/2015 FINAL  Final   Organism ID, Bacteria ENTEROCOCCUS SPECIES  Final      Susceptibility   Enterococcus species - MIC*    AMPICILLIN <=2 SENSITIVE Sensitive     VANCOMYCIN 1 SENSITIVE Sensitive     GENTAMICIN SYNERGY SENSITIVE Sensitive     * 70,000 COLONIES/ml ENTEROCOCCUS SPECIES  Culture, blood (routine x 2)     Status: None (Preliminary result)   Collection Time: 11/05/15  9:50 AM  Result Value Ref Range Status   Specimen Description BLOOD RIGHT HAND  Final   Special Requests IN PEDIATRIC BOTTLE 1CC  Final   Culture NO GROWTH 1 DAY  Final   Report Status PENDING  Incomplete  Culture, blood (routine x 2)     Status: None (Preliminary result)   Collection Time: 11/05/15  9:53 AM  Result Value Ref Range Status   Specimen Description BLOOD RIGHT ANTECUBITAL  Final   Special Requests BOTTLES DRAWN AEROBIC AND ANAEROBIC 5CC  Final   Culture NO GROWTH 1 DAY  Final   Report Status PENDING  Incomplete     Studies: No results found.  Scheduled Meds: . antiseptic oral rinse  7 mL Mouth Rinse BID  . cefTAZidime (FORTAZ)  IV  2 g Intravenous Q12H  . enoxaparin (LOVENOX) injection  40 mg Subcutaneous QHS  . lacosamide (VIMPAT) IV  200 mg Intravenous Q12H  . levETIRAcetam   1,500 mg Intravenous Q12H  . phenytoin (DILANTIN) IV  100 mg Intravenous 3 times per day  . vancomycin  500 mg Intravenous Q12H   Continuous Infusions: . dextrose 5 % and 0.45 % NaCl with KCl 20 mEq/L 100 mL/hr at 11/07/15 0253      Time spent: 35 minutes    Kathryn [name], [name] Pager [phone] If 7PM-7AM, please contact night-coverage at [email or web address], password [facility] 11/07/2015, 11:51 AM  LOS: 2 days

## 2015-11-07 NOTE — Progress Notes (Signed)
Subjective: No recurrence of clinical seizure activity reported. Facial twitching has ceased. Patient has been spontaneously opening her eyes and moving her left upper extremity. No clear purposeful activity has been noted.  Objective: Current vital signs: BP 175/69 mmHg  Pulse 100  Temp(Src) 98.1 F (36.7 C) (Oral)  Resp 23  SpO2 100%  Neurologic Exam: Patient opens eyes readily to verbal and tactile stimulation. There is no clear visual fixation and she has no visual tracking. She did not follow any simple commands. Pupils were equal and reacted normally to light. Extraocular movements were intact with oculocephalic maneuvers. Face was symmetrical. No facial twitches noted. Muscle tone was flaccid throughout. She occasionally had movements of her left upper extremity which were not purposeful.  EEG continues to show moderately severe generalized slowing, as well as what appears to be periodic frontally predominant sharp waves, but no frank epileptiform discharges.  CT scan of the head is pending.  Medications: I have reviewed the patient's current medications.  Assessment/Plan: 82 year old lady who presented with recurrent encephalopathic state with status epilepticus. He is currently on Dilantin, Vimpat and Keppra. Dilantin was added yesterday. Patient is more responsive to external stimuli today and is no longer having focal twitching involving her face. No purposeful activity is been noted so far, however. CT scan of her head is pending.  I will obtain a Dilantin level and adjust dosage as indicated. Since patient is showing signs of improvement, I will not make any changes in her treatment regimen for now.  C.R. Nicole Kindred, MD [facility] [phone]  11/07/2015  8:59 AM

## 2015-11-08 ENCOUNTER — Inpatient Hospital Stay (HOSPITAL_COMMUNITY): Payer: Medicare Other

## 2015-11-08 LAB — BASIC METABOLIC PANEL
ANION GAP: 10 (ref 5–15)
BUN: 6 mg/dL (ref 6–20)
CHLORIDE: 99 mmol/L — AB (ref 101–111)
CO2: 24 mmol/L (ref 22–32)
Calcium: 8.6 mg/dL — ABNORMAL LOW (ref 8.9–10.3)
Creatinine, Ser: 0.38 mg/dL — ABNORMAL LOW (ref 0.44–1.00)
GFR calc non Af Amer: >60 mL/min (ref >60)
Glucose, Bld: 152 mg/dL — ABNORMAL HIGH (ref 65–99)
POTASSIUM: 3 mmol/L — AB (ref 3.5–5.1)
SODIUM: 133 mmol/L — AB (ref 135–145)

## 2015-11-08 LAB — COMPREHENSIVE METABOLIC PANEL
ALBUMIN: 2.2 g/dL — AB (ref 3.5–5.0)
ALK PHOS: 78 U/L (ref 38–126)
ALT: 50 U/L (ref 14–54)
ANION GAP: 10 (ref 5–15)
AST: 44 U/L — AB (ref 15–41)
BUN: 6 mg/dL (ref 6–20)
CALCIUM: 9.2 mg/dL (ref 8.9–10.3)
CO2: 23 mmol/L (ref 22–32)
Chloride: 101 mmol/L (ref 101–111)
Creatinine, Ser: 0.33 mg/dL — ABNORMAL LOW (ref 0.44–1.00)
GFR calc Af Amer: >60 mL/min (ref >60)
GFR calc non Af Amer: >60 mL/min (ref >60)
GLUCOSE: 112 mg/dL — AB (ref 65–99)
Potassium: 4.2 mmol/L (ref 3.5–5.1)
SODIUM: 134 mmol/L — AB (ref 135–145)
Total Bilirubin: 0.4 mg/dL (ref 0.3–1.2)
Total Protein: 5.7 g/dL — ABNORMAL LOW (ref 6.5–8.1)

## 2015-11-08 LAB — CBC
HEMATOCRIT: 28.5 % — AB (ref 36.0–46.0)
HEMOGLOBIN: 9.7 g/dL — AB (ref 12.0–15.0)
MCH: 31.2 pg (ref 26.0–34.0)
MCHC: 34 g/dL (ref 30.0–36.0)
MCV: 91.6 fL (ref 78.0–100.0)
PLATELETS: 270 10*3/uL (ref 150–400)
RBC: 3.11 MIL/uL — AB (ref 3.87–5.11)
RDW: 14.9 % (ref 11.5–15.5)
WBC: 5.9 10*3/uL (ref 4.0–10.5)

## 2015-11-08 LAB — MAGNESIUM: Magnesium: 2.2 mg/dL (ref 1.7–2.4)

## 2015-11-08 LAB — AMMONIA: Ammonia: 34 umol/L (ref 9–35)

## 2015-11-08 LAB — VANCOMYCIN, TROUGH: Vancomycin Tr: 7 ug/mL — ABNORMAL LOW (ref 10.0–20.0)

## 2015-11-08 MED ORDER — POTASSIUM CHLORIDE 10 MEQ/100ML IV SOLN: 10.0000 meq | INTRAVENOUS | Status: DC

## 2015-11-08 MED ORDER — VANCOMYCIN HCL IN DEXTROSE 750-5 MG/150ML-% IV SOLN
750.0000 mg | Freq: Two times a day (BID) | INTRAVENOUS | Status: AC
  Administered 2015-11-08 – 2015-11-11 (×7): 750 mg via INTRAVENOUS
  Filled 2015-11-08 (×9): qty 150

## 2015-11-08 MED ORDER — SODIUM CHLORIDE 0.45 % IV SOLN
INTRAVENOUS | Status: DC
  Administered 2015-11-08 – 2015-11-10 (×4): via INTRAVENOUS
  Filled 2015-11-08 (×10): qty 1000

## 2015-11-08 MED ORDER — POTASSIUM CHLORIDE 10 MEQ/100ML IV SOLN
10.0000 meq | INTRAVENOUS | Status: AC
Start: 2015-11-08 — End: 2015-11-08
  Administered 2015-11-08 (×2): 10 meq via INTRAVENOUS
  Filled 2015-11-08 (×2): qty 100

## 2015-11-08 MED ORDER — POTASSIUM CHLORIDE 10 MEQ/100ML IV SOLN
10.0000 meq | INTRAVENOUS | Status: AC
  Administered 2015-11-08 (×3): 10 meq via INTRAVENOUS
  Filled 2015-11-08 (×3): qty 100

## 2015-11-08 MED ORDER — SODIUM CHLORIDE 0.9 % IV BOLUS (SEPSIS): 500.0000 mL | Freq: Once | INTRAVENOUS | Status: DC

## 2015-11-08 NOTE — Progress Notes (Signed)
Speech Language Pathology Treatment: Dysphagia  Patient Details Name: Kathryn [name] MRN: [id number] DOB: October 19, 1930 Today's Date: 11/08/2015 Time: [id number] SLP Time Calculation (min) (ACUTE ONLY): 8 min  Assessment / Plan / Recommendation Clinical Impression  Pt's affect similar to yesterday. Eyes open, cognitively unable to follow commands with max tactile/verbal stimuli. Did not allow applesauce beyond lower lip, no attempts to manipulate and suctioned from oral cavity. Continue NPO and consider alternate means of nutrition. Will follow up 3/10. Please contact ST sooner if pt exhibiting improvements and appropriateness for po's.   HPI HPI: 82 y.o. female with h/o HTN, TIA, CVA (2010, 2011), Parkinson's disease, seizures and frequent falls, who presented to ED on 3/5 in postictal state with R side gaze, R side facial twitch and R side twitching to R hand. Pt recently d/c 3/4 for acute encephalopathy/status epilecpticus (month-long stay). Per MD note, family reports speech/mental status mildly returned to baseline. MR Brain 3/3 findings compatible with resolution of previously noted status epilepticus. CXR 3/6 minimal atelectasis or early pneumonia in R infrahilar region. MBS 3/1 recommended Dysphagia 1 (puree) diet with honey thick liquids and meds crushed in puree.       SLP Plan  Continue with current plan of care     Recommendations  Diet recommendations: NPO Medication Administration: Via alternative means             Oral Care Recommendations: Oral care QID Follow up Recommendations: Skilled Nursing facility Plan: Continue with current plan of care     GO                Houston Siren 11/08/2015, 10:44 AM   Orbie Pyo Colvin Caroli.Ed [employer] [phone]

## 2015-11-08 NOTE — Progress Notes (Signed)
Pharmacy Antibiotic Note  Kathryn [name] is a 82 y.o. female admitted on 11/04/2015 with pneumonia.  Pharmacy has been consulted for vancomycin and ceftazidime dosing. Vancomycin trough of 7 this evening, will increase dose.   Plan: Increase Vancomycin to 750 mg IV every 12 hours.  Goal trough 15-20 mcg/mL.  Continue Ceftazidime 2 g q12h Monitor clinical progress, c/s, renal function, abx plan/LOT VT@ new SS  Weight: 124 lb 12.5 oz (56.6 kg)  Temp (24hrs), Avg:98 F (36.7 C), Min:97.7 F (36.5 C), Max:98.2 F (36.8 C)   Recent Labs Lab 11/04/15 2236 11/05/15 0155 11/05/15 0200 11/05/15 1746 11/05/15 1755 11/06/15 0600 11/07/15 0300 11/08/15 0243 11/08/15 1806  WBC 6.6  --   --  9.0  --  7.8 6.2 5.9  --   CREATININE 0.65  --   --  0.62  --  0.38* 0.42* 0.38* 0.33*  LATICACIDVEN 1.4 1.4 1.19  --  2.2*  --   --   --   --   VANCOTROUGH  --   --   --   --   --   --   --   --  7*    Estimated Creatinine Clearance: 44.1 mL/min (by C-G formula based on Cr of 0.33).    Antimicrobials this admission: Aztreonam 3/6 x 1 Vancomycin 3/6>> Metronidazole x1 Ceftazidime 3/6>> (patient had from 2/10 - 2/12)  Dose adjustments this admission: 3/9 Increasing vancomycin dose to 750 mg q 12 hour  Microbiology results: 3/5 Flu Neg 3/6 Blood x 2 3/6 Urine MRSA PCR  Thank you for allowing [place] to participate in this patients care. Jens Som, PharmD Pager: [phone]  11/08/2015 7:35 PM

## 2015-11-08 NOTE — Procedures (Addendum)
  Electroencephalogram report- LTM   Ordering Physician : Dr Nicole Kindred   Data acquisition: 10-20 electrode placement.  Additional T1, T2, and EKG electrodes; 26 channel digital referential acquisition reformatted to 18 channel/7 channel coronal bipolar     Beginning time: 11/05/15 at 10;34:05 am Ending time:11/08/15 08:30:03 am  Day of study: day 1 , day 2 , day 3     This   intensive EEG monitoring with simultaneous video monitoring was performed for this patient with unresponsiveness and facial twitching  as a part of ongoing series to capture events of interest and determine if these are seizures.    Medications: as per EMR  Day 1: There was no pushbutton activations events during this recording. EEG technologist noted facial twitching without specific lateralization. Due to sub optimal video unable to characterize further.  Background activities are marked by reactive background slowing 2-3 cps delta with superimposed 5-6 cps theta distributed broadly.  Superimposed non remitting left hemispheric PLEDS were present with maximum negativity in the left posterotemporal/parietal cortex.  Superimposed there is near continuous muscle twitching artifact was present obscuring EEG tracing. Muscle twitching artifact is the most prominent in the left frontal/fronto polar heels. Less prominent right fronto polar muscle artifact was also present.   Day 2: Continuous  left hemispheric PLEDS were present with maximum negativity in the left posterotemporal/parietal cortex, T5.  Since 4 pm left periodic lateralized epileptiform discharges PLEDs  become more organized , rhythmic,  synchronized with evolving features and much broader  negative field extending to the right hemisphere consistent with electrographic seizures and nonconvulsive status epilepticus.  Superimposed there is near continuous muscle twitching artifact was present and most likely attributable to the facial twitching.  Day 3: Left hemispheric  PLEDS were present with maximum negativity in the left posterotemporal/parietal cortex, although less developed, and less organized There were no subclinical seizures.   Clinical interpretation: This intensive EEG monitoring with simultaneous monitoring is abnormal due to left hemispheric PLEDS, although slightly improved compare to prior days of recording. There were no electrographic seizures present last 24 hours. Continuous monitoring is recommended to insure the resolution of left hemispheric PLEDs and NCSE.

## 2015-11-08 NOTE — Clinical Social Work Note (Signed)
CSW was informed that patient is from [facility].  CSW updated [facility] that patient is not medically ready for discharge yet.  CSW to continue to follow patient's progress.  Utt Broom. [place], MSW, [place] 11/08/2015 6:29 PM

## 2015-11-08 NOTE — Progress Notes (Addendum)
[facility] PROGRESS NOTE  Kathryn [name] [id number] DOB: June 13, 1931 DOA: 11/04/2015 PCP: Lilian Coma, MD Brief narrative 33 old female with history of CVA, Parkinson's disease, seizure disorder,, hypertension, OSA, hypothyroidism and frequent falls, with recent prolonged hospitalization for acute encephalopathy with status epilepticus. She was on continuous ECG monitoring during the prior hospitalization. She was discharged to skilled nursing facility and as per family she was eating well and mental status was improving to baseline. At the facility patient had a focal seizures lasting for almost 10 minutes on the evening of admission. When EMS arrived patient had a right-sided gaze with right facial twitch and twitching of the right hand. She was given 5 mg of IV Versed and without with some improvement. Patient admitted to stepdown unit for recurrent encephalopathic state with status epilepticus. Placed on IV Vimpat and Keppra. Dilantin added on 3/6. Neurology consulted.  Assessment/Plan: Persistent encephalopathy with status epilepticus Neurology closely following. Patient placed on 24-hour intensive EEG monitoring with simultaneous video monitoring Shows left hemispheric PLEDS and worsening electrographic seizures on day 2 suggestive of non-convulsive status epilepticus. Last 24 hours it has shown some improvement with no seizure activity documented. On IV Vimpat, Keppra and IV Dilantin. -Patient however is drowsy and not awake today. She also has persistent right upper extremity weakness. Concern for new stroke versus Todd's paralysis. -Discussed plan with neurology.   History of CVA/TIA Old lacunar infarct as per MRI done recently.  OSA Continue CPAP  UTI 70K colonies  of enterococcus. Has low-grade fever. We'll continue vancomycin for now.  Hypokalemia Replenish with fluids. Replenished low magnesium.  Parkinson's disease Pt was on amantadine prior to  admission  Elevated blood pressure on when necessary hydralazine.   DVT  prophylaxis: Subcutaneous Lovenox  Diet: Nothing by mouth  Code Status: DO NOT RESUSCITATE Family Communication: Spoke with daughter-in-law on the phone Disposition Plan: Continue step down monitoring   Consultants:  Neurology  Procedures:  Head CT  Continuous EEG monitoring  Antibiotics:  IV vancomycin  since 3/6  HPI/Subjective: Seen and examined. Poorly responsive today  Objective: Filed Vitals:   11/08/15 1100 11/08/15 1136  BP: 176/77   Pulse: 92   Temp:  97.7 F (36.5 C)  Resp: 22     Intake/Output Summary (Last 24 hours) at 11/08/15 1140 Last data filed at 11/08/15 1021  Gross per 24 hour  Intake   2925 ml  Output   2400 ml  Net    525 ml   Filed Weights   11/08/15 0300  Weight: 56.6 kg (124 lb 12.5 oz)    Exam:   General:  Elderly female lying in bed not in distress, Minimally responsive  HEENT: No pallor, supple neck  Cardiovascular: Normal S1 and S2, no murmurs rub or gallop  Chest: Clear bilaterally  Abdomen: Soft, nondistended, nontender, bowel sounds present, Foley in place  Musculoskeletal : warm, no edema   CNS:, Lethargic, nonresponsive, right upper extremity weakness  Data Reviewed: Basic Metabolic Panel:  Recent Labs Lab 11/04/15 2236 11/05/15 1746 11/06/15 0600 11/07/15 0300 11/08/15 0243  NA 138 141 137 134* 133*  K 3.9 3.8 3.0* 3.2* 3.0*  CL 100* 103 101 102 99*  CO2 25 24 24 23 24   GLUCOSE 121* 126* 139* 159* 152*  BUN 18 16 10 8 6   CREATININE 0.65 0.62 0.38* 0.42* 0.38*  CALCIUM 10.2 10.0 9.2 8.5* 8.6*  MG  --   --   --  1.3* 2.2   Liver Function  Tests:  Recent Labs Lab 11/04/15 2236 11/05/15 1746  AST 84* 55*  ALT 122* 100*  ALKPHOS 92 92  BILITOT 0.7 0.6  PROT 6.7 6.5  ALBUMIN 2.8* 2.7*   No results for input(s): LIPASE, AMYLASE in the last 168 hours. No results for input(s): AMMONIA in the last 168  hours. CBC:  Recent Labs Lab 11/04/15 2236 11/05/15 1746 11/06/15 0600 11/07/15 0300 11/08/15 0243  WBC 6.6 9.0 7.8 6.2 5.9  NEUTROABS 3.9 6.1  --   --   --   HGB 12.1 11.5* 9.6* 9.7* 9.7*  HCT 35.6* 35.1* 28.2* 29.8* 28.5*  MCV 92.7 93.6 92.5 91.7 91.6  PLT 269 267 232 244 270   Cardiac Enzymes: No results for input(s): CKTOTAL, CKMB, CKMBINDEX, TROPONINI in the last 168 hours. BNP (last 3 results)  Recent Labs  10/09/15 0228  BNP 64.5    ProBNP (last 3 results) No results for input(s): PROBNP in the last 8760 hours.  CBG:  Recent Labs Lab 11/02/15 1627 11/02/15 2138 11/03/15 0632 11/03/15 1139 11/03/15 1624  GLUCAP 112* 123* 109* 121* 107*    Recent Results (from the past 240 hour(s))  Culture, Urine     Status: None   Collection Time: 11/05/15 12:05 AM  Result Value Ref Range Status   Specimen Description Urine  Final   Special Requests NONE  Final   Culture 70,000 COLONIES/ml ENTEROCOCCUS SPECIES  Final   Report Status 11/07/2015 FINAL  Final   Organism ID, Bacteria ENTEROCOCCUS SPECIES  Final      Susceptibility   Enterococcus species - MIC*    AMPICILLIN <=2 SENSITIVE Sensitive     VANCOMYCIN 1 SENSITIVE Sensitive     GENTAMICIN SYNERGY SENSITIVE Sensitive     * 70,000 COLONIES/ml ENTEROCOCCUS SPECIES  Culture, blood (routine x 2)     Status: None (Preliminary result)   Collection Time: 11/05/15  9:50 AM  Result Value Ref Range Status   Specimen Description BLOOD RIGHT HAND  Final   Special Requests IN PEDIATRIC BOTTLE 1CC  Final   Culture NO GROWTH 2 DAYS  Final   Report Status PENDING  Incomplete  Culture, blood (routine x 2)     Status: None (Preliminary result)   Collection Time: 11/05/15  9:53 AM  Result Value Ref Range Status   Specimen Description BLOOD RIGHT ANTECUBITAL  Final   Special Requests BOTTLES DRAWN AEROBIC AND ANAEROBIC 5CC  Final   Culture NO GROWTH 2 DAYS  Final   Report Status PENDING  Incomplete     Studies: No  results found.  Scheduled Meds: . enoxaparin (LOVENOX) injection  40 mg Subcutaneous QHS  . lacosamide (VIMPAT) IV  200 mg Intravenous Q12H  . levETIRAcetam  1,500 mg Intravenous Q12H  . phenytoin (DILANTIN) IV  100 mg Intravenous 3 times per day  . sodium chloride  500 mL Intravenous Once  . vancomycin  500 mg Intravenous Q12H   Continuous Infusions: . sodium chloride 0.45 % 1,000 mL with potassium chloride 40 mEq infusion 75 mL/hr at 11/08/15 1024      Time spent: 35 minutes    Tanaka Gillen, [facility] Pager [phone] If 7PM-7AM, please contact night-coverage at [email or web address], password [facility] 11/08/2015, 11:40 AM  LOS: 3 days

## 2015-11-08 NOTE — Progress Notes (Signed)
Unhooked LTM for CT Scan; no skin breakdown noted. Dr Nicole Kindred to advise whether patient will be re-hooked.

## 2015-11-08 NOTE — Progress Notes (Signed)
With contineous bedsisde EEG, continue to monitor.

## 2015-11-08 NOTE — Progress Notes (Signed)
Subjective: Patient remains nonverbal. She intermittently opens eyes and moves left upper extremity. No recurrence of right facial twitching noted.  Objective: Current vital signs: BP 167/70 mmHg  Pulse 92  Temp(Src) 97.7 F (36.5 C) (Oral)  Resp 16  Wt 56.6 kg (124 lb 12.5 oz)  SpO2 100%  Neurologic Exam: Patient responds to verbal and tactile relation with eye opening and visual fixation towards the left side approached from the left. She does not move eyes past midline with the right side. She does not follow any commands. She will blink to visual threat from the left side but not from the right side. Moves left upper extremity non-purposely. There is no movement of right extremities.  Medications: I have reviewed the patient's current medications.  EEG recorded on 11/07/2015 showed moderate to severe slowing of cerebral activity few sleep, as well as PLEDs involving the left hemisphere which were less prominent with signs of improvement compared to recordings over previous few days. No frank seizure activity was noted.  CT scan of the head today without contrast showed no acute intracranial abnormality. There was no sign of acute or recent stroke. Atrophic and small vessel chronic changes were noted.  Assessment/Plan: Etiology for persistent encephalopathic state remains unclear. Neglect of right side is also of unclear etiology, particularly in the absence of acute stroke. 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 Patient appears to no longer be experiencing seizure activity.  Her liver enzymes were elevated on 11/06/2015. Significance is unclear. I will repeat a comprehensive metabolic panel, as well as obtain an ammonia level.  I will also continue EEG and video monitoring for at least overnight tonight.  No changes were made in current anticonvulsant medications.  We will continue to follow this patient closely with you.  C.R.  Nicole Kindred, MD [facility] [phone]  11/08/2015  4:49 PM

## 2015-11-08 NOTE — Progress Notes (Signed)
LTM EEG restarted

## 2015-11-09 ENCOUNTER — Inpatient Hospital Stay (HOSPITAL_COMMUNITY): Payer: Medicare Other

## 2015-11-09 DIAGNOSIS — J9600 Acute respiratory failure, unspecified whether with hypoxia or hypercapnia: Secondary | ICD-10-CM

## 2015-11-09 LAB — BASIC METABOLIC PANEL
ANION GAP: 10 (ref 5–15)
CALCIUM: 8.9 mg/dL (ref 8.9–10.3)
CO2: 24 mmol/L (ref 22–32)
Chloride: 100 mmol/L — ABNORMAL LOW (ref 101–111)
Creatinine, Ser: 0.33 mg/dL — ABNORMAL LOW (ref 0.44–1.00)
GFR calc Af Amer: >60 mL/min (ref >60)
GLUCOSE: 95 mg/dL (ref 65–99)
POTASSIUM: 4.1 mmol/L (ref 3.5–5.1)
SODIUM: 134 mmol/L — AB (ref 135–145)

## 2015-11-09 LAB — BLOOD GAS, ARTERIAL
ACID-BASE EXCESS: 1 mmol/L (ref 0.0–2.0)
BICARBONATE: 24.4 meq/L — AB (ref 20.0–24.0)
Drawn by: 281201
FIO2: 0.5
LHR: 16 {breaths}/min
O2 SAT: 99.6 %
PATIENT TEMPERATURE: 98.6
PCO2 ART: 33.9 mmHg — AB (ref 35.0–45.0)
PEEP/CPAP: 5 cmH2O
PO2 ART: 204 mmHg — AB (ref 80.0–100.0)
TCO2: 25.4 mmol/L (ref 0–100)
VT: 420 mL
pH, Arterial: 7.47 — ABNORMAL HIGH (ref 7.350–7.450)

## 2015-11-09 LAB — CBC
HEMATOCRIT: 32.8 % — AB (ref 36.0–46.0)
Hemoglobin: 10.6 g/dL — ABNORMAL LOW (ref 12.0–15.0)
MCH: 29.9 pg (ref 26.0–34.0)
MCHC: 32.3 g/dL (ref 30.0–36.0)
MCV: 92.7 fL (ref 78.0–100.0)
Platelets: 279 10*3/uL (ref 150–400)
RBC: 3.54 MIL/uL — ABNORMAL LOW (ref 3.87–5.11)
RDW: 14.7 % (ref 11.5–15.5)
WBC: 5.5 10*3/uL (ref 4.0–10.5)

## 2015-11-09 LAB — PHENYTOIN LEVEL, TOTAL: PHENYTOIN LVL: 15.7 ug/mL (ref 10.0–20.0)

## 2015-11-09 MED ORDER — ROCURONIUM BROMIDE 50 MG/5ML IV SOLN
50.0000 mg | Freq: Once | INTRAVENOUS | Status: AC
  Administered 2015-11-09: 50 mg via INTRAVENOUS

## 2015-11-09 MED ORDER — MIDAZOLAM BOLUS VIA INFUSION
15.0000 mg | Freq: Once | INTRAVENOUS | Status: AC
  Administered 2015-11-09: 15 mg via INTRAVENOUS

## 2015-11-09 MED ORDER — PANTOPRAZOLE SODIUM 40 MG IV SOLR: 40.0000 mg | INTRAVENOUS | Status: DC

## 2015-11-09 MED ORDER — DEXTROSE 5 % IV SOLN
0.0000 ug/min | INTRAVENOUS | Status: DC
  Administered 2015-11-10: 60 ug/min via INTRAVENOUS
  Administered 2015-11-10: 20 ug/min via INTRAVENOUS
  Filled 2015-11-09 (×3): qty 1

## 2015-11-09 MED ORDER — FENTANYL CITRATE (PF) 100 MCG/2ML IJ SOLN
100.0000 ug | Freq: Once | INTRAMUSCULAR | Status: AC
  Administered 2015-11-09: 100 ug via INTRAVENOUS
  Filled 2015-11-09: qty 2

## 2015-11-09 MED ORDER — SODIUM CHLORIDE 0.9 % IV SOLN
30.0000 mg/h | INTRAVENOUS | Status: DC
  Administered 2015-11-09: 3 mg/h via INTRAVENOUS
  Administered 2015-11-09: 20 mg/h via INTRAVENOUS
  Filled 2015-11-09 (×2): qty 10

## 2015-11-09 MED ORDER — LORAZEPAM 2 MG/ML IJ SOLN: 1.0000 mg | Freq: Once | INTRAMUSCULAR | Status: DC

## 2015-11-09 MED ORDER — LORAZEPAM 2 MG/ML IJ SOLN
INTRAMUSCULAR | Status: AC
  Administered 2015-11-09: 1 mg
  Filled 2015-11-09: qty 1

## 2015-11-09 MED ORDER — LORAZEPAM 2 MG/ML IJ SOLN
1.0000 mg | Freq: Once | INTRAMUSCULAR | Status: AC
  Administered 2015-11-09: 1 mg via INTRAVENOUS
  Filled 2015-11-09: qty 1

## 2015-11-09 MED ORDER — MIDAZOLAM HCL 2 MG/2ML IJ SOLN
2.0000 mg | Freq: Once | INTRAMUSCULAR | Status: AC
  Administered 2015-11-09: 2 mg via INTRAVENOUS
  Filled 2015-11-09: qty 2

## 2015-11-09 MED ORDER — FENTANYL CITRATE (PF) 100 MCG/2ML IJ SOLN: 50.0000 ug | INTRAMUSCULAR | Status: DC | PRN

## 2015-11-09 MED ORDER — FENTANYL CITRATE (PF) 100 MCG/2ML IJ SOLN
50.0000 ug | INTRAMUSCULAR | Status: DC | PRN
  Administered 2015-11-15 – 2015-11-28 (×23): 50 ug via INTRAVENOUS
  Filled 2015-11-09 (×23): qty 2

## 2015-11-09 MED ORDER — BISACODYL 10 MG RE SUPP
10.0000 mg | Freq: Every day | RECTAL | Status: DC | PRN
  Administered 2015-11-13: 10 mg via RECTAL
  Filled 2015-11-09: qty 1

## 2015-11-09 MED ORDER — SODIUM CHLORIDE 0.9 % IV SOLN
10.0000 mg/h | INTRAVENOUS | Status: DC
  Administered 2015-11-09: 40 mg/h via INTRAVENOUS
  Administered 2015-11-10: 50 mg/h via INTRAVENOUS
  Administered 2015-11-10: 15 mg/h via INTRAVENOUS
  Administered 2015-11-10: 50 mg/h via INTRAVENOUS
  Administered 2015-11-10: 40 mg/h via INTRAVENOUS
  Administered 2015-11-10 – 2015-11-14 (×6): 10 mg/h via INTRAVENOUS
  Administered 2015-11-14: 8 mg/h via INTRAVENOUS
  Filled 2015-11-09 (×14): qty 20

## 2015-11-09 MED ORDER — ETOMIDATE 2 MG/ML IV SOLN
20.0000 mg | Freq: Once | INTRAVENOUS | Status: AC
  Administered 2015-11-09: 20 mg via INTRAVENOUS

## 2015-11-09 NOTE — Procedures (Signed)
  Electroencephalogram report- LTM   Ordering Physician : Dr Nicole Kindred   Data acquisition: 10-20 electrode placement.  Additional T1, T2, and EKG electrodes; 26 channel digital referential acquisition reformatted to 18 channel/7 channel coronal bipolar     Beginning time: 11/05/15 at 10;34:05 am Ending time:3/9 /17 14: 23:21   Day of study: day 1 , day 2 , day 3 , day 4 limited     This intensive EEG monitoring with simultaneous video monitoring was performed for this patient with unresponsiveness and facial twitching  as a part of ongoing series to capture events of interest and determine if these are seizures.    Medications: as per EMR  Day 1: There was no pushbutton activations events during this recording. EEG technologist noted facial twitching without specific lateralization. Due to sub optimal video unable to characterize further.  Background activities are marked by reactive background slowing 2-3 cps delta with superimposed 5-6 cps theta distributed broadly.  Superimposed non remitting left hemispheric PLEDS were present with maximum negativity in the left posterotemporal/parietal cortex.  Superimposed there is near continuous muscle twitching artifact was present obscuring EEG tracing. Muscle twitching artifact is the most prominent in the left frontal/fronto polar heels. Less prominent right fronto polar muscle artifact was also present.   Day 2: Continuous  left hemispheric PLEDS were present with maximum negativity in the left posterotemporal/parietal cortex, T5.  Since 4 pm left periodic lateralized epileptiform discharges PLEDs  become more organized , rhythmic,  synchronized with evolving features and much broader  negative field extending to the right hemisphere consistent with electrographic seizures and nonconvulsive status epilepticus.  Superimposed there is near continuous muscle twitching artifact was present and most likely attributable to the facial twitching.  Day 3: Left  hemispheric PLEDS were present with maximum negativity in the left posterotemporal/parietal cortex, although less developed, and less organized There were no subclinical seizures.   Day 4:  Left hemispheric PLEDS were present with maximum negativity in the left posterotemporal/parietal cortex, although less developed, and less organized essentially unchanged compare to day 3  of recording  There were no subclinical seizures.    Clinical interpretation: This intensive EEG monitoring with simultaneous monitoring is abnormal due to left hemispheric PLEDS, although slightly improved compare to prior days of recording. There were no electrographic seizures present last 24 hours. Continuous monitoring is recommended to insure the resolution of left hemispheric PLEDs and NCSE.

## 2015-11-09 NOTE — Progress Notes (Signed)
Dr. Leonel Ramsay called and instructed RN to increase Versed to 20mg /hr.  He also gave orders to call CCM to consult and manage BP due to versed increase.  MD is going to modify Versed orders.  Sherril Cong, RN

## 2015-11-09 NOTE — Consult Note (Signed)
PULMONARY / CRITICAL CARE MEDICINE   Name: Kathryn [name] MRN: [id number] DOB: 01-19-1931    ADMISSION DATE:  11/04/2015 CONSULTATION DATE:  11/09/15  REFERRING MD:  Neurology / Dr. Nicole Kindred   CHIEF COMPLAINT:  Status Epilepticus   HISTORY OF PRESENT ILLNESS:   [name] with PMH of depression, HTN, HLD, hypothyroidism, frequent UTI's, migraines, fatty liver, rectal prolapse s/p surgery, CVA / TIA, Parkinson's disease, frequent falls and seizures who presented to [facility] on 11/04/15 from [facility] (SNF) with reports of seizure.    SNF staff reported a 10 minute focal seizure.  She was treated with versed with resolution of seizure.  The patient was admitted per [facility] for further evaluation.  Of note, the patient was recently discharged 11/03/15 for acute encephalopathy in the setting of status epilepticus.  During that time she required continuous EEG for 1.5 weeks and was intubated.  OF NOTE, THE PATIENT IS A DIFFICULT AIRWAY.  After discharge, she reportedly returned to baseline mental status.    The patient was evaluated by Neurology with multiple EEG's and felt to have ongoing seizures.  PCCM consulted on 3/10 for elective intubation to allow for versed gtt / sedation to suppress seizures.    PAST MEDICAL HISTORY :  She  has a past medical history of Depression; Hypertension; High cholesterol; Rectal prolapse; adenomatous colonic polyps; Internal hemorrhoids; Hypothyroidism; Fecal incontinence; TIA (transient ischemic attack); History of frequent urinary tract infections; Migraine; Fatty liver (03/20/13); Generalized ischemic cerebrovascular disease (10/09/2015); and Stroke [facility]) (Sept 1, 2010; Sept 12, 2011).  PAST SURGICAL HISTORY: She  has past surgical history that includes Colon surgery (2010, for prolapsed organs after hystecrtomy surgery); Appendectomy (2008); Cholecystectomy; Lumbar laminectomy/decompression microdiscectomy (N/A, 01/07/2013); Back surgery; Hemorrhoid surgery (09/2008); and Abdominal  hysterectomy (05/2007).  Allergies  Allergen Reactions  . Augmentin [Amoxicillin-Pot Clavulanate] Swelling and Diarrhea    Throat   . Citalopram Swelling    Eyes ears and throat swelling Throat and eyes   . Codeine Anaphylaxis and Shortness Of Breath  . Fish-Derived Products Anaphylaxis  . Hyoscyamine Anaphylaxis and Swelling  . Ibuprofen Swelling    Throat and eyes   . Latex Swelling and Rash    Swelling to troat  . Lipitor [Atorvastatin] Swelling    Muscle aches throat  . Septra [Bactrim] Anaphylaxis  . Shellfish Allergy Anaphylaxis  . Miconazole Rash  . Keflet [Cephalexin] Diarrhea    Upset stomach  . Meloxicam Diarrhea and Nausea And Vomiting  . Verapamil Other (See Comments)    Tachycardia and flushing  . Vicodin [Hydrocodone-Acetaminophen] Other (See Comments)    stroke  . Zoloft [Sertraline Hcl] Swelling    Red spots all over face, swelling of tongue and legs.   . Ciprofloxacin Nausea And Vomiting    Other reaction(s): Abdominal Pain  . Depakene [Valproate Sodium] Hives    All over body   . Isoptin Sr [Verapamil Hcl Er] Cough  . Lisinopril Cough    Fatigue and cough  . Sulfa Antibiotics Rash    Rash and also throat was tight  . Telmisartan-Hctz Cough    No current facility-administered medications on file prior to encounter.   Current Outpatient Prescriptions on File Prior to Encounter  Medication Sig  . amantadine (SYMMETREL) 100 MG capsule Take 1 capsule (100 mg total) by mouth 2 (two) times daily.  Marland Kitchen aspirin EC 81 MG tablet Take 81 mg by mouth daily.  . baclofen (LIORESAL) 10 MG tablet Take 1 tablet (10 mg total) by  mouth at bedtime.  . baclofen (LIORESAL) 10 MG tablet Take 0.5 tablets (5 mg total) by mouth daily after breakfast.  . estradiol (VIVELLE-DOT) 0.0375 MG/24HR Place 0.5 patches onto the skin 2 (two) times a week. Sunday and Wed  . fluticasone (FLONASE) 50 MCG/ACT nasal spray Place 2 sprays into the nose daily as needed for allergies.   Marland Kitchen  levETIRAcetam (KEPPRA) 500 MG tablet Take 1 tablet (500 mg total) by mouth 2 (two) times daily.  Marland Kitchen losartan (COZAAR) 100 MG tablet Take 100 mg by mouth daily.  Marland Kitchen nystatin-triamcinolone (MYCOLOG II) cream Apply 1 application topically 2 (two) times daily.  . pantoprazole (PROTONIX) 40 MG tablet Take 1 tablet (40 mg total) by mouth every morning.  . Probiotic Product (PROBIOTIC DAILY PO) Take 1 tablet by mouth daily.  . propranolol (INDERAL) 40 MG tablet Take 40 mg by mouth 3 (three) times daily. 8am, 2pm, 8pm  . sennosides (SENOKOT) 8.8 MG/5ML syrup Take 5 mLs by mouth 2 (two) times daily as needed for mild constipation.  . simvastatin (ZOCOR) 40 MG tablet Take 40 mg by mouth every morning.   . vitamin B-12 (CYANOCOBALAMIN) 100 MCG tablet Take 100 mcg by mouth daily.  . vitamin C (ASCORBIC ACID) 500 MG tablet Take 500 mg by mouth daily.  . vitamin E 200 UNIT capsule Take 200 Units by mouth daily.     FAMILY HISTORY:  Her indicated that her mother is deceased. She indicated that her father is deceased.   SOCIAL HISTORY: She  reports that she has never smoked. She has never used smokeless tobacco. She reports that she does not drink alcohol or use illicit drugs.  REVIEW OF SYSTEMS:   Unable to complete with patient as altered   SUBJECTIVE:    VITAL SIGNS: BP 168/84 mmHg  Pulse 72  Temp(Src) 98.3 F (36.8 C) (Axillary)  Resp 20  Wt 126 lb 8.7 oz (57.4 kg)  SpO2 100%  HEMODYNAMICS:    VENTILATOR SETTINGS:    INTAKE / OUTPUT: I/O last 3 completed shifts: In: 3585 [I.V.:2540; IV Piggyback:1045] Out: [id number] [Urine:3175]  PHYSICAL EXAMINATION: General:  Elderly female in NAD, EEG monitoring ongoing  Neuro:  Obtunded, withdraws to painful stimuli, pupils 3-66mm =R, no response to voice  HEENT:  MM pink/moist, no jvd appreciated  Cardiovascular:  s1s2 rrr, distant tones  Lungs:  Even/non-labored, lungs bilaterally clear  Abdomen:  Soft, bsx4 active  Musculoskeletal:  No acute  deformities  Skin:  Warm/dry, trace edema  LABS:  BMET  Recent Labs Lab 11/08/15 0243 11/08/15 1806 11/09/15 0300  NA 133* 134* 134*  K 3.0* 4.2 4.1  CL 99* 101 100*  CO2 24 23 24   BUN 6 6 <5*  CREATININE 0.38* 0.33* 0.33*  GLUCOSE 152* 112* 95    Electrolytes  Recent Labs Lab 11/07/15 0300 11/08/15 0243 11/08/15 1806 11/09/15 0300  CALCIUM 8.5* 8.6* 9.2 8.9  MG 1.3* 2.2  --   --     CBC  Recent Labs Lab 11/07/15 0300 11/08/15 0243 11/09/15 0300  WBC 6.2 5.9 5.5  HGB 9.7* 9.7* 10.6*  HCT 29.8* 28.5* 32.8*  PLT 244 270 279    Coag's No results for input(s): APTT, INR in the last 168 hours.  Sepsis Markers  Recent Labs Lab 11/05/15 0155 11/05/15 0200 11/05/15 1755  LATICACIDVEN 1.4 1.19 2.2*    ABG  Recent Labs Lab 11/05/15 1821  PHART 7.445  PCO2ART 35.7  PO2ART 82.0    Liver Enzymes  Recent Labs Lab 11/04/15 2236 11/05/15 1746 11/08/15 1806  AST 84* 55* 44*  ALT 122* 100* 50  ALKPHOS 92 92 78  BILITOT 0.7 0.6 0.4  ALBUMIN 2.8* 2.7* 2.2*    Cardiac Enzymes No results for input(s): TROPONINI, PROBNP in the last 168 hours.  Glucose  Recent Labs Lab 11/02/15 1627 11/02/15 2138 11/03/15 0632 11/03/15 1139 11/03/15 1624  GLUCAP 112* 123* 109* 121* 107*    Imaging Ct Head Wo Contrast  11/08/2015  CLINICAL DATA:  PT UNRESPONSIVE. ADMITTED SEVERAL DAYS AGO FOR SEIZURES. BEEN ON CONTINUOUS EEG SINCE. EXAM: CT HEAD WITHOUT CONTRAST TECHNIQUE: Contiguous axial images were obtained from the base of the skull through the vertex without intravenous contrast. COMPARISON:  11/02/2015, 10/10/2015 FINDINGS: There is central and cortical atrophy. Periventricular white matter changes are consistent with small vessel disease. There is no intra or extra-axial fluid collection or mass lesion. The basilar cisterns and ventricles have a normal appearance. There is no CT evidence for acute infarction or hemorrhage. Bone windows demonstrate small  bilateral mastoid effusions. Air-fluid level identified within the sphenoid air cells. There is mucoperiosteal thickening in scattered ethmoid sinuses. IMPRESSION: 1. Atrophy and small vessel disease. 2.  No evidence for acute intracranial abnormality. 3. Acute and chronic paranasal sinusitis. 4. Bilateral mastoid effusions. Electronically Signed   By: Nolon Nations M.D.   On: 11/08/2015 16:09     STUDIES:  3/10 Continuous EEG >> Left hemispheric PLEDS were present with maximum negativity in the left posterotemporal/parietal cortex, although less developed, and less organized essentially unchanged compare to day 3 of recording  There were no subclinical seizures  CULTURES: HIV 3/6 >> negative  BCx2 3/6 >>  UC 3/6 >> enterococcus >> pan sensitive   ANTIBIOTICS: Aztreonam 3/6 >> 3/6  Fortaz 3/6 >> 3/8 Vanco 3/6 >>  SIGNIFICANT EVENTS: 3/04  Discharged after episode of status epilepticus 3/05  Readmitted  LINES/TUBES: ETT 3/10 >>  DISCUSSION: [name] with PMH of CVA and recent admission for status epilepticus readmitted 3/5 for repeat seizure activity.  Despite AED's, the patient continues to have seizures on continuous EEG.  PCCM called for elective intubation for deep sedation to control seizures.   ASSESSMENT / PLAN:   NEUROLOGIC A:   Acute Encephalopathy in setting of Status Epilepticus Hx CVA, Parkinson's Disease P:   RASS goal: -4 Versed gtt to control seizures Continue Vimpat, Keppra, Dilantin & Ativan  Serial neuro exams   PULMONARY A: OSA - on CPAP Hx Difficult Airway  P:   Now elective intubation for sedation  PRVC 8cc/kg Wean PEEP / FiO2 for sats > 92% Intermittent CXR  ABG one hour post intubation   CARDIOVASCULAR A:  Hypertension  DNR  P:  ICU monitoring  Sedation will likely improve HTN  RENAL A:   Hypokalemia  P:   Trend BMP / UOP  Replace electrolytes as indicated   GASTROINTESTINAL A:   At Risk Malnutrition - NPO status, frail  elderly  P:   NPO  Insert OGT  Begin TF in am 3/11 Protonix for SUP   HEMATOLOGIC A:   Anemia  P:  Trend CBC  Lovenox for DVT prophylaxis   INFECTIOUS A:   Enterococcus UTI  P:   ABX as above  Monitor cultures as above   ENDOCRINE A:   No acute issues  P:   Monitor glucose on BMP      FAMILY  - Updates: Family updated via phone per Dr. Vaughan Browner   -  Inter-disciplinary family meet or Palliative Care meeting due by:  3/17     Noe Gens, NP-C [facility] Pgr: [phone] or if no answer [phone] 11/09/2015, 3:32 PM

## 2015-11-09 NOTE — Progress Notes (Signed)
Pt is a DNR but is being intubated due to seizure activity and she needs high dose Versed.  Family was consulted.   Dr. Nicole Kindred called and asked RN to increase Versed up to 7mg /hr

## 2015-11-09 NOTE — Progress Notes (Signed)
Subjective: Patient response to external stimuli with eye opening and gaze fixation from midline to the left side. She still inattentive to right side. She spontaneously moves upper extremity has no movement on the right. There is no verbal output and she does not follow simple commands. No recurrence of clinical seizure activity reported overnight.  Objective: Current vital signs: BP 168/84 mmHg  Pulse 72  Temp(Src) 98.3 F (36.8 C) (Axillary)  Resp 20  Wt 57.4 kg (126 lb 8.7 oz)  SpO2 100%  Neurologic Exam: Responsive earlier to verbal and tactile sensation as described above. Patient received Ativan 1 mg 2 earlier and is currently sedated. Pupils were equal and reacted normally to light. Extraocular movements were limited to midline and left lateral gaze conjugately. Face was symmetrical. Muscle tone was flaccid throughout. Tendon reflexes were 1+ and symmetrical. Plantar responses were mute.  Medications: I have reviewed the patient's current medications.  EEG monitoring showed continuous PLEDs involving the left hemisphere primarily. There was minimal change in EEG activity with administration of Ativan.  Assessment/Plan: 82 year old lady with encephalopathic state with recurrent tonsillitis seizures which appeared to be controlled with medication at this point. She has continued headache discharges involving left hemisphere primarily. These discharges are most often seen with ischemic lesion such as stroke. However the scan of her head yesterday showed no indication of of acute or recent stroke. Patient previously had persistent PLEDs responded to intubation and CNS suppression.  No changes in current anticonvulsant medications recommended at this time. CCM has been consulted for intubation and start with Versed drip IV and titrate as needed for for CNS suppression, and continue to monitor with long-term EEG and video recording.  C.R. Nicole Kindred, MD [facility] [phone]  11/09/2015  3:32 PM

## 2015-11-09 NOTE — Progress Notes (Signed)
Patient moved from [facility] to 3M10; LTM stopped and restarted in new room. Pt head checked along with electrode impedances.

## 2015-11-09 NOTE — Procedures (Signed)
Intubation Procedure Note Kathryn [name] [id number] [birth year]  Procedure: Intubation Indications: Airway protection and maintenance  Procedure Details Consent: Risks of procedure as well as the alternatives and risks of each were explained to the (patient/caregiver).  Consent for procedure obtained. Time Out: Verified patient identification, verified procedure, site/side was marked, verified correct patient position, special equipment/implants available, medications/allergies/relevent history reviewed, required imaging and test results available.  Performed  Kathryn [name] and 3   Evaluation Hemodynamic Status: BP stable throughout; O2 sats: stable throughout Patient's Current Condition: stable Complications: No apparent complications Patient did tolerate procedure well. Chest X-ray ordered to verify placement.  CXR: pending.   Kathryn [name] 11/09/2015

## 2015-11-09 NOTE — Progress Notes (Signed)
Speech Language Pathology Treatment: Dysphagia  Patient Details Name: Kathryn [name] MRN: [id number] DOB: [birth year] Today's Date: 11/09/2015 Time: [id number] SLP Time Calculation (min) (ACUTE ONLY): 8 min  Assessment / Plan / Recommendation Clinical Impression  There appears to be no improvement in pt's awareness and cognitive ability over multiple attempts this week . Oral hygiene performed. Pt allowed applesauce in oral cavity; no lingual movements given max tactile cues with dry spoon and verbal stimuli. Oral cavity suctioned. ST will discharge pt at this time; re-consult if improvements. SLP paging MD to inquire about alternative nutrition.    HPI HPI: 82 y.o. female with h/o HTN, TIA, CVA (2010, 2011), Parkinson's disease, seizures and frequent falls, who presented to ED on 3/5 in postictal state with R side gaze, R side facial twitch and R side twitching to R hand. Pt recently d/c 3/4 for acute encephalopathy/status epilecpticus (month-long stay). Per MD note, family reports speech/mental status mildly returned to baseline. MR Brain 3/3 findings compatible with resolution of previously noted status epilepticus. CXR 3/6 minimal atelectasis or early pneumonia in R infrahilar region. MBS 3/1 recommended Dysphagia 1 (puree) diet with honey thick liquids and meds crushed in puree.       SLP Plan  Discharge SLP treatment due to (comment) (no improvement in status)     Recommendations  Diet recommendations: NPO Medication Administration: Via alternative means             Oral Care Recommendations: Oral care QID Follow up Recommendations: Skilled Nursing facility Plan: Discharge SLP treatment due to (comment) (no improvement in status)     GO                Houston Siren 11/09/2015, 9:51 AM   Orbie Pyo Colvin Caroli.Ed [employer] [phone]

## 2015-11-09 NOTE — Care Management Important Message (Signed)
Important Message  Patient Details  Name: SADONNA ROYSTON MRN: [id number] Date of Birth: 1931-07-07   Medicare Important Message Given:  Yes    Vashawn Ekstein P Izack Hoogland 11/09/2015, 12:52 PM

## 2015-11-09 NOTE — Progress Notes (Signed)
Nutrition Follow-up  DOCUMENTATION CODES:   Not applicable  INTERVENTION:   -If pt unable to advance to PO diet safely, recommend:  Initiate Jevity 1.2 @ 20 ml/hr via NGT and increase by 10 ml every 4 hours to goal rate of 50 ml/hr.   Tube feeding regimen provides 1440 kcal (100% of needs), 66 grams of protein, and 968 ml of H2O.   NUTRITION DIAGNOSIS:   Inadequate oral intake related to inability to eat as evidenced by NPO status.  Ongoing  GOAL:   Patient will meet greater than or equal to 90% of their needs  Unmet  MONITOR:   Diet advancement, Labs, Weight trends, Skin, I & O's  REASON FOR ASSESSMENT:   Low Braden    ASSESSMENT:   82 y/o with history of depression, TIA, CVA, Parkinson's disease, seizure d/o, HTN, OSA, hypothyroidism, Polypharmacy. Patient was brought in by EMS from [facility] due to prolonged seizure activity.   Pt remains NPO. Reviewed SLP note from this AM. Pt with no improvement in awareness and cognitive ability. SLP to discuss with MD need for artificial means of nutrition and hydration. RD will place TF recommendations.   Neurology following; pt continues with EEG and video monitoring. Pt remains encephalopathic, but no longer experiencing seizure activity.  Labs reviewed: Na: 134 (on IV supplementation).   Diet Order:  Diet NPO time specified  Skin:  Reviewed, no issues  Last BM:  11/07/15  Height:   Ht Readings from Last 1 Encounters:  10/21/15 5\' 3"  (1.6 m)    Weight:   Wt Readings from Last 1 Encounters:  11/09/15 126 lb 8.7 oz (57.4 kg)    Ideal Body Weight:  52.3 kg  BMI:  Body mass index is 22.42 kg/(m^2).  Estimated Nutritional Needs:   Kcal:  1400-1600  Protein:  60-70 grams  Fluid:  1.4-1.6 L  EDUCATION NEEDS:   No education needs identified at this time  Maicey Barrientez A. Jimmye Norman, RD, LDN, CDE Pager: [phone] After hours Pager: [phone]

## 2015-11-09 NOTE — Progress Notes (Signed)
[facility] Progress Note Patient Name: Kathryn [name] DOB: [birth year] MRN: [id number]   Date of Service  11/09/2015  HPI/Events of Note  Contacted by bedside nurse. Patient remains in status epilepticus and neurology plans on escalating Versed infusion Possible hypotension with increasing dose of sedation.  eICU Interventions  Referring very low-dose peripheral Neo-Synephrine infusion to use as needed. Nurse to contact me if this becomes necessary as I will then have intensivist place central venous access for further Vasopressor support.     Intervention Category Intermediate Interventions: Other:  Tera Partridge 11/09/2015, 7:23 PM

## 2015-11-09 NOTE — Progress Notes (Addendum)
[facility] PROGRESS NOTE  Kathryn [name] [id number] DOB: November 05, 1930 DOA: 11/04/2015 PCP: Lilian Coma, MD Brief narrative 60 old female with history of CVA, Parkinson's disease, seizure disorder,, hypertension, OSA, hypothyroidism and frequent falls, with recent prolonged hospitalization for acute encephalopathy with status epilepticus. She was on continuous ECG monitoring during the prior hospitalization. She was discharged to skilled nursing facility and as per family she was eating well and mental status was improving to baseline. At the facility patient had a focal seizures lasting for almost 10 minutes on the evening of admission. When EMS arrived patient had a right-sided gaze with right facial twitch and twitching of the right hand. She was given 5 mg of IV Versed and without with some improvement. Patient admitted to stepdown unit for recurrent encephalopathic state with status epilepticus. Placed on IV Vimpat and Keppra. Dilantin added on 3/6. Neurology consulted.  Assessment/Plan: Persistent encephalopathy with status epilepticus Neurology closely following. Patient placed on 48-hour intensive EEG monitoring with simultaneous video monitoring Shows left hemispheric PLEDS and worsening electrographic seizures on day 2 suggestive of non-convulsive status epilepticus. Discussed with neurology and patient still having frequent short spikes that wanted to Valium. On IV Vimpat, Keppra and IV Dilantin. -She is awake today but nonverbal. She also has persistent right upper extremity weakness. Concern for new stroke versus Todd's paralysis. -Discussed with Dr. Nicole Kindred and recommends that patient may benefit from being intubated and sedated with benzodiazepine. Patient responded the intubation and sedation during previous hospitalization. He has discussed this with patient's son who agrees. -PC CM consulted and will evaluate the patient.   History of CVA/TIA Old lacunar infarct as per  MRI done recently.  OSA Continue CPAP  UTI 70K colonies  of enterococcus. Had low-grade fever. Will treat with IV vancomycin for total 5 days.  Hypokalemia Replenish with fluids. Replenished low magnesium.  Parkinson's disease Pt was on amantadine prior to admission  Elevated blood pressure on when necessary hydralazine.   DVT  prophylaxis: Subcutaneous Lovenox  Diet: Nothing by mouth, NG feeding after patient intubated.  Code Status: DO NOT RESUSCITATE Family Communication: Spoke with son on the phone Disposition Plan: PC CM consulted for intubation and ICU transfer.   Consultants:  Neurology  Procedures:  Head CT  Continuous EEG monitoring  Antibiotics:  IV vancomycin  since 3/6  HPI/Subjective: Seen and examined. Opens eyes spontaneously and occasionally follows 88 nonverbal  Objective: Filed Vitals:   11/09/15 1200 11/09/15 1225  BP:    Pulse: 72   Temp:  98.3 F (36.8 C)  Resp: 20     Intake/Output Summary (Last 24 hours) at 11/09/15 1452 Last data filed at 11/09/15 1200  Gross per 24 hour  Intake   2225 ml  Output   2375 ml  Net   -150 ml   Filed Weights   11/08/15 0300 11/09/15 0446  Weight: 56.6 kg (124 lb 12.5 oz) 57.4 kg (126 lb 8.7 oz)    Exam:   General:  Elderly female lying in bed not in distress, opens eyes and occasionally folows  HEENT: supple neck  Cardiovascular: Normal S1 and S2, no murmurs rub or gallop  Chest: Clear bilaterally  Abdomen: Soft, nondistended, nontender, bowel sounds present, Foley in place  Musculoskeletal : warm, no edema   CNS: opens eye spontaneously occasionally following,  right upper extremity weakness  Data Reviewed: Basic Metabolic Panel:  Recent Labs Lab 11/06/15 0600 11/07/15 0300 11/08/15 0243 11/08/15 1806 11/09/15 0300  NA 137 134* 133*  134* 134*  K 3.0* 3.2* 3.0* 4.2 4.1  CL 101 102 99* 101 100*  CO2 24 23 24 23 24   GLUCOSE 139* 159* 152* 112* 95  BUN 10 8 6 6  <5*   CREATININE 0.38* 0.42* 0.38* 0.33* 0.33*  CALCIUM 9.2 8.5* 8.6* 9.2 8.9  MG  --  1.3* 2.2  --   --    Liver Function Tests:  Recent Labs Lab 11/04/15 2236 11/05/15 1746 11/08/15 1806  AST 84* 55* 44*  ALT 122* 100* 50  ALKPHOS 92 92 78  BILITOT 0.7 0.6 0.4  PROT 6.7 6.5 5.7*  ALBUMIN 2.8* 2.7* 2.2*   No results for input(s): LIPASE, AMYLASE in the last 168 hours.  Recent Labs Lab 11/08/15 1809  AMMONIA 34   CBC:  Recent Labs Lab 11/04/15 2236 11/05/15 1746 11/06/15 0600 11/07/15 0300 11/08/15 0243 11/09/15 0300  WBC 6.6 9.0 7.8 6.2 5.9 5.5  NEUTROABS 3.9 6.1  --   --   --   --   HGB 12.1 11.5* 9.6* 9.7* 9.7* 10.6*  HCT 35.6* 35.1* 28.2* 29.8* 28.5* 32.8*  MCV 92.7 93.6 92.5 91.7 91.6 92.7  PLT 269 267 232 244 270 279   Cardiac Enzymes: No results for input(s): CKTOTAL, CKMB, CKMBINDEX, TROPONINI in the last 168 hours. BNP (last 3 results)  Recent Labs  10/09/15 0228  BNP 64.5    ProBNP (last 3 results) No results for input(s): PROBNP in the last 8760 hours.  CBG:  Recent Labs Lab 11/02/15 1627 11/02/15 2138 11/03/15 0632 11/03/15 1139 11/03/15 1624  GLUCAP 112* 123* 109* 121* 107*    Recent Results (from the past 240 hour(s))  Culture, Urine     Status: None   Collection Time: 11/05/15 12:05 AM  Result Value Ref Range Status   Specimen Description Urine  Final   Special Requests NONE  Final   Culture 70,000 COLONIES/ml ENTEROCOCCUS SPECIES  Final   Report Status 11/07/2015 FINAL  Final   Organism ID, Bacteria ENTEROCOCCUS SPECIES  Final      Susceptibility   Enterococcus species - MIC*    AMPICILLIN <=2 SENSITIVE Sensitive     VANCOMYCIN 1 SENSITIVE Sensitive     GENTAMICIN SYNERGY SENSITIVE Sensitive     * 70,000 COLONIES/ml ENTEROCOCCUS SPECIES  Culture, blood (routine x 2)     Status: None (Preliminary result)   Collection Time: 11/05/15  9:50 AM  Result Value Ref Range Status   Specimen Description BLOOD RIGHT HAND  Final    Special Requests IN PEDIATRIC BOTTLE 1CC  Final   Culture NO GROWTH 4 DAYS  Final   Report Status PENDING  Incomplete  Culture, blood (routine x 2)     Status: None (Preliminary result)   Collection Time: 11/05/15  9:53 AM  Result Value Ref Range Status   Specimen Description BLOOD RIGHT ANTECUBITAL  Final   Special Requests BOTTLES DRAWN AEROBIC AND ANAEROBIC 5CC  Final   Culture NO GROWTH 4 DAYS  Final   Report Status PENDING  Incomplete     Studies: Ct Head Wo Contrast  11/08/2015  CLINICAL DATA:  PT UNRESPONSIVE. ADMITTED SEVERAL DAYS AGO FOR SEIZURES. BEEN ON CONTINUOUS EEG SINCE. EXAM: CT HEAD WITHOUT CONTRAST TECHNIQUE: Contiguous axial images were obtained from the base of the skull through the vertex without intravenous contrast. COMPARISON:  11/02/2015, 10/10/2015 FINDINGS: There is central and cortical atrophy. Periventricular white matter changes are consistent with small vessel disease. There is no intra or extra-axial  fluid collection or mass lesion. The basilar cisterns and ventricles have a normal appearance. There is no CT evidence for acute infarction or hemorrhage. Bone windows demonstrate small bilateral mastoid effusions. Air-fluid level identified within the sphenoid air cells. There is mucoperiosteal thickening in scattered ethmoid sinuses. IMPRESSION: 1. Atrophy and small vessel disease. 2.  No evidence for acute intracranial abnormality. 3. Acute and chronic paranasal sinusitis. 4. Bilateral mastoid effusions. Electronically Signed   By: Nolon Nations M.D.   On: 11/08/2015 16:09    Scheduled Meds: . enoxaparin (LOVENOX) injection  40 mg Subcutaneous QHS  . lacosamide (VIMPAT) IV  200 mg Intravenous Q12H  . levETIRAcetam  1,500 mg Intravenous Q12H  . LORazepam  1 mg Intravenous Once  . phenytoin (DILANTIN) IV  100 mg Intravenous 3 times per day  . sodium chloride  500 mL Intravenous Once  . vancomycin  750 mg Intravenous Q12H   Continuous Infusions: . sodium  chloride 0.45 % 1,000 mL with potassium chloride 40 mEq infusion 75 mL/hr at 11/09/15 1323      Time spent: 35 minutes    Kathryn [name], [facility] Pager [phone] If 7PM-7AM, please contact night-coverage at [email or web address], password [facility] 11/09/2015, 2:52 PM  LOS: 4 days

## 2015-11-10 ENCOUNTER — Inpatient Hospital Stay (HOSPITAL_COMMUNITY): Payer: Medicare Other

## 2015-11-10 DIAGNOSIS — R569 Unspecified convulsions: Secondary | ICD-10-CM

## 2015-11-10 DIAGNOSIS — J988 Other specified respiratory disorders: Secondary | ICD-10-CM

## 2015-11-10 LAB — GLUCOSE, CAPILLARY
GLUCOSE-CAPILLARY: 122 mg/dL — AB (ref 65–99)
GLUCOSE-CAPILLARY: 91 mg/dL (ref 65–99)
Glucose-Capillary: 130 mg/dL — ABNORMAL HIGH (ref 65–99)
Glucose-Capillary: 89 mg/dL (ref 65–99)
Glucose-Capillary: 99 mg/dL (ref 65–99)

## 2015-11-10 LAB — TRIGLYCERIDES: Triglycerides: 133 mg/dL (ref <150)

## 2015-11-10 LAB — CULTURE, BLOOD (ROUTINE X 2)
Culture: NO GROWTH
Culture: NO GROWTH

## 2015-11-10 LAB — BASIC METABOLIC PANEL
Anion gap: 9 (ref 5–15)
BUN: 9 mg/dL (ref 6–20)
CHLORIDE: 101 mmol/L (ref 101–111)
CO2: 23 mmol/L (ref 22–32)
CREATININE: 0.38 mg/dL — AB (ref 0.44–1.00)
Calcium: 9 mg/dL (ref 8.9–10.3)
GFR calc Af Amer: >60 mL/min (ref >60)
GFR calc non Af Amer: >60 mL/min (ref >60)
GLUCOSE: 132 mg/dL — AB (ref 65–99)
POTASSIUM: 3.5 mmol/L (ref 3.5–5.1)
Sodium: 133 mmol/L — ABNORMAL LOW (ref 135–145)

## 2015-11-10 LAB — CBC
HEMATOCRIT: 26.7 % — AB (ref 36.0–46.0)
Hemoglobin: 8.7 g/dL — ABNORMAL LOW (ref 12.0–15.0)
MCH: 30 pg (ref 26.0–34.0)
MCHC: 32.6 g/dL (ref 30.0–36.0)
MCV: 92.1 fL (ref 78.0–100.0)
PLATELETS: 351 10*3/uL (ref 150–400)
RBC: 2.9 MIL/uL — ABNORMAL LOW (ref 3.87–5.11)
RDW: 14.8 % (ref 11.5–15.5)
WBC: 4.8 10*3/uL (ref 4.0–10.5)

## 2015-11-10 MED ORDER — PHENYLEPHRINE HCL 10 MG/ML IJ SOLN
0.0000 ug/min | INTRAVENOUS | Status: DC
  Administered 2015-11-10: 50 ug/min via INTRAVENOUS
  Administered 2015-11-10: 0 ug/min via INTRAVENOUS
  Administered 2015-11-11: 30 ug/min via INTRAVENOUS
  Administered 2015-11-12: 20 ug/min via INTRAVENOUS
  Administered 2015-11-13: 12 ug/min via INTRAVENOUS
  Filled 2015-11-10 (×3): qty 4

## 2015-11-10 MED ORDER — VITAL HIGH PROTEIN PO LIQD: 1000.0000 mL | ORAL | Status: DC

## 2015-11-10 MED ORDER — SODIUM CHLORIDE 0.9 % IV BOLUS (SEPSIS)
500.0000 mL | Freq: Once | INTRAVENOUS | Status: AC
  Administered 2015-11-10: 500 mL via INTRAVENOUS

## 2015-11-10 MED ORDER — PANTOPRAZOLE SODIUM 40 MG PO PACK
40.0000 mg | PACK | ORAL | Status: DC
  Administered 2015-11-10 – 2015-11-28 (×19): 40 mg
  Filled 2015-11-10 (×17): qty 20

## 2015-11-10 MED ORDER — VITAL HIGH PROTEIN PO LIQD
1000.0000 mL | ORAL | Status: DC
  Administered 2015-11-10 – 2015-11-11 (×4)
  Administered 2015-11-11 – 2015-11-13 (×3): 1000 mL
  Administered 2015-11-14: 01:00:00
  Filled 2015-11-10: qty 1000

## 2015-11-10 MED ORDER — MIDAZOLAM BOLUS VIA INFUSION
20.0000 mg | Freq: Once | INTRAVENOUS | Status: AC
  Administered 2015-11-10: 20 mg via INTRAVENOUS
  Filled 2015-11-10: qty 20

## 2015-11-10 MED ORDER — PRO-STAT SUGAR FREE PO LIQD
30.0000 mL | Freq: Two times a day (BID) | ORAL | Status: DC
  Administered 2015-11-10 – 2015-11-14 (×8): 30 mL via ORAL
  Filled 2015-11-10 (×8): qty 30

## 2015-11-10 MED ORDER — ANTISEPTIC ORAL RINSE SOLUTION (CORINZ)
7.0000 mL | OROMUCOSAL | Status: DC
  Administered 2015-11-10 – 2015-11-28 (×182): 7 mL via OROMUCOSAL

## 2015-11-10 MED ORDER — PROPOFOL 1000 MG/100ML IV EMUL
5.0000 ug/kg/min | INTRAVENOUS | Status: DC
  Administered 2015-11-10: 80 ug/kg/min via INTRAVENOUS
  Administered 2015-11-10: 50 ug/kg/min via INTRAVENOUS
  Administered 2015-11-10: 60 ug/kg/min via INTRAVENOUS
  Administered 2015-11-10: 20 ug/kg/min via INTRAVENOUS
  Administered 2015-11-10 – 2015-11-12 (×6): 40 ug/kg/min via INTRAVENOUS
  Administered 2015-11-12: 30 ug/kg/min via INTRAVENOUS
  Administered 2015-11-12: 40.07 ug/kg/min via INTRAVENOUS
  Administered 2015-11-13: 30 ug/kg/min via INTRAVENOUS
  Filled 2015-11-10 (×14): qty 100

## 2015-11-10 MED ORDER — CHLORHEXIDINE GLUCONATE 0.12% ORAL RINSE (MEDLINE KIT)
15.0000 mL | Freq: Two times a day (BID) | OROMUCOSAL | Status: DC
  Administered 2015-11-10 – 2015-11-28 (×37): 15 mL via OROMUCOSAL

## 2015-11-10 MED ORDER — VITAL HIGH PROTEIN PO LIQD
1000.0000 mL | ORAL | Status: DC
  Administered 2015-11-10: 15:00:00
  Filled 2015-11-10: qty 1000

## 2015-11-10 NOTE — Progress Notes (Signed)
Paged CCM and Dr. Halford Chessman returned page. RN notified MD of pts low temp of 39.73F and that bear hugger and warm blankets were applied at 8am.   No new orders were given.  Sherril Cong, RN

## 2015-11-10 NOTE — Progress Notes (Signed)
PULMONARY / CRITICAL CARE MEDICINE   Name: Kathryn [name] MRN: [id number] DOB: Jan 15, 1931    ADMISSION DATE:  11/04/2015 CONSULTATION DATE:  11/09/15  REFERRING MD:  Neurology / Dr. Nicole Kindred   CHIEF COMPLAINT: Seizure   SUBJECTIVE:   Remains heavily sedated. VITAL SIGNS: BP 124/59 mmHg  Pulse 51  Temp(Src) 98.1 F (36.7 C) (Oral)  Resp 16  Wt 126 lb 8.7 oz (57.4 kg)  SpO2 100%  VENTILATOR SETTINGS: Vent Mode:  [-] PRVC FiO2 (%):  [40 %-50 %] 40 % Set Rate:  [16 bmp] 16 bmp Vt Set:  [420 mL] 420 mL PEEP:  [5 cmH20] 5 cmH20 Plateau Pressure:  [11 cmH20-14 cmH20] 14 cmH20  INTAKE / OUTPUT: I/O last 3 completed shifts: In: 4019.5 [I.V.:3144.5; IV Piggyback:875] Out: 2645 [Urine:2645]  PHYSICAL EXAMINATION: General: sedated Neuro: RASS -4 HEENT: ETT in place Cardiac: regular Chest: no wheeze Abd: soft, non tender Ext: no edema Skin: no rashes  LABS:  BMET  Recent Labs Lab 11/08/15 0243 11/08/15 1806 11/09/15 0300  NA 133* 134* 134*  K 3.0* 4.2 4.1  CL 99* 101 100*  CO2 24 23 24   BUN 6 6 <5*  CREATININE 0.38* 0.33* 0.33*  GLUCOSE 152* 112* 95    Electrolytes  Recent Labs Lab 11/07/15 0300 11/08/15 0243 11/08/15 1806 11/09/15 0300  CALCIUM 8.5* 8.6* 9.2 8.9  MG 1.3* 2.2  --   --     CBC  Recent Labs Lab 11/08/15 0243 11/09/15 0300 11/10/15 0630  WBC 5.9 5.5 4.8  HGB 9.7* 10.6* 8.7*  HCT 28.5* 32.8* 26.7*  PLT 270 279 351    Coag's No results for input(s): APTT, INR in the last 168 hours.  Sepsis Markers  Recent Labs Lab 11/05/15 0155 11/05/15 0200 11/05/15 1755  LATICACIDVEN 1.4 1.19 2.2*    ABG  Recent Labs Lab 11/05/15 1821 11/09/15 1750  PHART 7.445 7.470*  PCO2ART 35.7 33.9*  PO2ART 82.0 204*    Liver Enzymes  Recent Labs Lab 11/04/15 2236 11/05/15 1746 11/08/15 1806  AST 84* 55* 44*  ALT 122* 100* 50  ALKPHOS 92 92 78  BILITOT 0.7 0.6 0.4  ALBUMIN 2.8* 2.7* 2.2*    Cardiac Enzymes No results for  input(s): TROPONINI, PROBNP in the last 168 hours.  Glucose  Recent Labs Lab 11/03/15 1139 11/03/15 1624  GLUCAP 121* 107*    Imaging Dg Chest Port 1 View  11/10/2015  CLINICAL DATA:  Seizure. EXAM: PORTABLE CHEST 1 VIEW COMPARISON:  Yesterday at 1709 hour FINDINGS: Endotracheal tube is 2.9 cm from the carina. There is a new left central line, tip in the mid SVC. No pneumothorax. Patient is rotated to the right. The cardiomediastinal contours are unchanged. Developing hazy opacity at the lung bases, question pleural effusions. Probable bibasilar atelectasis. IMPRESSION: 1. Tip of the left central line in the SVC. No pneumothorax. Endotracheal tube remains in place. 2. Question developing pleural effusions. Electronically Signed   By: Jeb Levering M.D.   On: 11/10/2015 03:52   Dg Chest Port 1 View  11/09/2015  CLINICAL DATA:  Intubation for status epilepticus. EXAM: PORTABLE CHEST 1 VIEW COMPARISON:  11/05/2015 and prior exams FINDINGS: Endotracheal tube is identified with tip 3.6 cm above the carina. The cardiomediastinal silhouette is unchanged. Minimal basilar atelectasis noted. There is no evidence of focal airspace disease, pulmonary edema, suspicious pulmonary nodule/mass, pleural effusion, or pneumothorax. No acute bony abnormalities are identified. IMPRESSION: Endotracheal tube placement with tip 3.6 cm above the  carina. Minimal basilar atelectasis. Electronically Signed   By: Margarette Canada M.D.   On: 11/09/2015 17:24     STUDIES:  3/10 Continuous EEG >> Left hemispheric PLEDS were present with maximum negativity in the left posterotemporal/parietal cortex, although less developed, and less organized essentially unchanged compare to day 3 of recording  There were no subclinical seizures  CULTURES: HIV 3/6 >> negative  BCx2 3/6 >>  UC 3/6 >> enterococcus >> pan sensitive   ANTIBIOTICS: Aztreonam 3/6 >> 3/6  Fortaz 3/6 >> 3/8 Vanco 3/6 >>  SIGNIFICANT EVENTS: 3/04   Discharged after episode of status epilepticus 3/05  Readmitted  LINES/TUBES: ETT 3/10 >> Lt IJ CVL 3/11 >>  DISCUSSION: [name] with PMH of CVA and recent admission for status epilepticus readmitted 3/5 for repeat seizure activity.  Despite AED's, the patient continues to have seizures on continuous EEG.  PCCM called for elective intubation for deep sedation to control seizures.   ASSESSMENT / PLAN:   NEUROLOGIC A:   Acute Encephalopathy in setting of Status Epilepticus Hx CVA, Parkinson's Disease P:   RASS goal: -4 Versed gtt to control seizures Continue Vimpat, Keppra, Dilantin & Ativan  Serial neuro exams   PULMONARY A: OSA - on CPAP Hx Difficult Airway  Compromised airway in setting of seizurex P:   Full vent support until neuro status improved   CARDIOVASCULAR A:  Hypertension  DNR  P:  Monitor hemodynamics  RENAL A:   Oliguria P:   Continue IV fluids  GASTROINTESTINAL A:   Nutrition P:   Tube feeds Protonix for SUP  HEMATOLOGIC A:   Anemia of critical illness and chronic disease P:  Trend CBC  Lovenox for DVT prophylaxis   INFECTIOUS A:   Enterococcus UTI >> multiple Abx allergies P:   Day 6/7 vancomycin  ENDOCRINE A:   No acute issues  P:   Monitor glucose on BMP   CC time 31 minutes.  Chesley Mires, MD [facility] Pulmonary/Critical Care 11/10/2015, 7:07 AM Pager:  [phone] After 3pm call: [phone]

## 2015-11-10 NOTE — Progress Notes (Addendum)
Pt temp was 93.46F.  RN applied bear hugger warming blanket with 3 warm blankets.

## 2015-11-10 NOTE — Procedures (Signed)
  Electroencephalogram report- LTM   Ordering Physician : Dr Nicole Kindred   Data acquisition: 10-20 electrode placement.  Additional T1, T2, and EKG electrodes; 26 channel digital referential acquisition reformatted to 18 channel/7 channel coronal bipolar     Beginning time: 11/05/15 at 10;34:05 am Ending time:3/11 /17 08:23:12 am  Day of study: day 1 , day 2 , day 3 , day 4 , day 5    This intensive EEG monitoring with simultaneous video monitoring was performed for this patient with unresponsiveness and facial twitching  as a part of ongoing series to capture events of interest and determine if these are seizures.    Medications: as per EMR  Day 1: There was no pushbutton activations events during this recording. EEG technologist noted facial twitching without specific lateralization. Due to sub optimal video unable to characterize further.  Background activities are marked by reactive background slowing 2-3 cps delta with superimposed 5-6 cps theta distributed broadly.  Superimposed non remitting left hemispheric PLEDS were present with maximum negativity in the left posterotemporal/parietal cortex.  Superimposed there is near continuous muscle twitching artifact was present obscuring EEG tracing. Muscle twitching artifact is the most prominent in the left frontal/fronto polar heels. Less prominent right fronto polar muscle artifact was also present.   Day 2: Continuous  left hemispheric PLEDs were present with maximum negativity in the left posterotemporal/parietal cortex, T5.  Since 4 pm left periodic lateralized epileptiform discharges PLEDs  become more organized , rhythmic,  synchronized with evolving features and much broader  negative field extending to the right hemisphere consistent with electrographic seizures and nonconvulsive status epilepticus.  Superimposed there is near continuous muscle twitching artifact was present and most likely attributable to the facial twitching.  Day 3:  Left hemispheric PLEDs were present with maximum negativity in the left posterotemporal/parietal cortex, although less developed, and less organized There were no subclinical seizures.   Day 4:  Left hemispheric PLEDs were present with maximum negativity in the left posterotemporal/parietal cortex, although less developed, and less organized essentially unchanged compare to day 3  of recording  There were no subclinical seizures.   Day 5:  Left hemispheric PLEDs were present with gradual  resolution and development of burst suppression around midnight. Burst suppression with 2-3 seconds burst and 7 seconds of suppression was sustained until 5 am . It that point suppressed background has dominated .   Clinical interpretation: This intensive EEG monitoring with simultaneous monitoring is abnormal due to left hemispheric PLEDs and reflects a signigicant  cortical irritability in the left hemisphere, particularly posterior temporal/parietal cortex.  Burst suppression pattern was archived during last 24 hours of the recoding with suppression predominance during last 3 hours of the recording. Decreasing sedation would be recommended in order  to revert back to burst/suppression pattern .

## 2015-11-10 NOTE — Progress Notes (Signed)
Nutrition Follow-up  DOCUMENTATION CODES:  Not applicable  INTERVENTION:  Given current propofol rate, Decrease VHP to 25 ml/hr via NG/OGT   30 ml Prostat BID.    Tube feeding regimen provides 800 kcal + 454 kcal from propofol  (103% of needs), 83 grams of protein, and 502 ml of H2O.    NUTRITION DIAGNOSIS:  Inadequate oral intake related to inability to eat as evidenced by NPO status.  Ongoing  GOAL:  Patient will meet greater than or equal to 90% of their needs  Unmet  MONITOR:  Vent status, Labs, I & O's, TF tolerance  REASON FOR ASSESSMENT:  Consult Enteral/tube feeding initiation and management  ASSESSMENT:  82 y/o with history of depression, TIA, CVA, Parkinson's disease, seizure d/o, HTN, OSA, hypothyroidism, Polypharmacy. Patient was brought in by EMS from [facility] due to prolonged seizure activity.   PCCM consulted on 3/10 for elective intubation to allow for versed gtt / sedation to suppress seizures.   Propofol being titrated down, however unclear what goal rate of propofol at this time.   Patient is currently intubated on ventilator support MV: 7.1 L/min Temp (24hrs), Avg:96.3 F (35.7 C), Min:93.2 F (34 C), Max:99.9 F (37.7 C)  Propofol: 17.2 ml/hr= 454 kcal/day  Labs reviewed:Triglycerides-133 , Anemic   Diet Order:  Diet NPO time specified  Skin: Dry, Ecchymotic arms  Last BM:  3/10  Height:  Ht Readings from Last 1 Encounters:  10/21/15 5\' 3"  (1.6 m)   Weight:  Wt Readings from Last 1 Encounters:  11/09/15 126 lb 8.7 oz (57.4 kg)   Wt Readings from Last 10 Encounters:  11/09/15 126 lb 8.7 oz (57.4 kg)  11/03/15 123 lb 12.8 oz (56.155 kg)  12/27/13 134 lb (60.782 kg)  11/15/13 132 lb 6 oz (60.045 kg)  10/04/13 136 lb (61.689 kg)  08/09/13 136 lb 4 oz (61.803 kg)  05/21/13 135 lb 6.4 oz (61.417 kg)  05/17/13 143 lb (64.864 kg)  04/05/13 142 lb 8 oz (64.638 kg)  01/17/13 148 lb 12.8 oz (67.495 kg)  Lowest weight 2/25: 115  lbs (52.27 kg)  Ideal Body Weight:  52.3 kg  BMI:  Body mass index is 22.42 kg/(m^2).  Estimated Nutritional Needs:  Kcal:  1219 kcals Protein:  73-84 g (1.4-1.6 g/kg bw) Fluid:  PER MD  EDUCATION NEEDS:  No education needs identified at this time  Burtis Junes RD, LDN Nutrition Pager: [phone] 11/10/2015 4:38 PM

## 2015-11-10 NOTE — Procedures (Signed)
History: 82 yo F presented with seizures.   Sedation: None  Technique: This is a 21 channel routine scalp EEG performed at the bedside with bipolar and monopolar montages arranged in accordance to the international 10/20 system of electrode placement. One channel was dedicated to EKG recording.    Background: The background consists predominantlyof intermixed alpha and beta activities. There is aposterior dominant rhythm of 9Hz . there is some mild irregular slow activity at times. There is an increase in delta associate with drowsiness, but sleep is not recorded.  Photic stimulation: Physiologic driving is now performed  EEG Abnormalities: 1) mild generalized irregular slow activity  Clinical Interpretation: This EEG is consistent with a mild generalized nonspecific dysfunction, compared to previous recordings the left sided discharges are no longer present.  Roland Rack, MD [facility] [phone]  If 7pm- 7am, please page neurology on call as listed in [place].

## 2015-11-10 NOTE — Procedures (Signed)
Central Venous Catheter Insertion Procedure Note MAYMIE VORNDRAN [id number] [birth year]  Procedure: Insertion of Central Venous Catheter Indications: Drug and/or fluid administration  Procedure Details Consent: Risks of procedure as well as the alternatives and risks of each were explained to the (patient/caregiver).  Consent for procedure obtained. Time Out: Verified patient identification, verified procedure, site/side was marked, verified correct patient position, special equipment/implants available, medications/allergies/relevent history reviewed, required imaging and test results available.  Performed  Maximum sterile technique was used including antiseptics, cap, gloves, gown, hand hygiene, mask and sheet. Skin prep: Chlorhexidine; local anesthetic administered A antimicrobial bonded/coated triple lumen catheter was placed in the left internal jugular vein using the Seldinger technique.  After local anesthesia, the finder needle was advanced into the vessel under ultrasound guidance. Dark red non-pulsatile blood was aspirated. The wire was threaded into the vessel and needle removed. The wire was confirmed in the vein with ultrasound. A nick was made in the skin and the tract was dilated. The catheter was advanced over the wire into the vessel. The wire was removed. All ports aspirated and flushed easily. The catheter was sutured in place and biopatch and dressing were applied. No complications. Post procedure CXR as above. EBL <5cc.   Evaluation Blood flow good Complications: No apparent complications Patient did tolerate procedure well. Chest X-ray ordered to verify placement.  CXR: LIJ catheter courses across the midline with tip overlying the SVC.  Yisroel Ramming, MD Pulmonary and Critical Care 11/10/2015 3:04 AM

## 2015-11-10 NOTE — Progress Notes (Signed)
Dr Minette Brine at bedside and reading chest xray post central line insertion. Orders okay to use

## 2015-11-10 NOTE — Progress Notes (Signed)
[place] for phenytoin Indication: seizures  Allergies  Allergen Reactions  . Augmentin [Amoxicillin-Pot Clavulanate] Swelling and Diarrhea    Throat   . Citalopram Swelling    Eyes ears and throat swelling Throat and eyes   . Codeine Anaphylaxis and Shortness Of Breath  . Fish-Derived Products Anaphylaxis  . Hyoscyamine Anaphylaxis and Swelling  . Ibuprofen Swelling    Throat and eyes   . Latex Swelling and Rash    Swelling to troat  . Lipitor [Atorvastatin] Swelling    Muscle aches throat  . Septra [Bactrim] Anaphylaxis  . Shellfish Allergy Anaphylaxis  . Miconazole Rash  . Keflet [Cephalexin] Diarrhea    Upset stomach  . Meloxicam Diarrhea and Nausea And Vomiting  . Verapamil Other (See Comments)    Tachycardia and flushing  . Vicodin [Hydrocodone-Acetaminophen] Other (See Comments)    stroke  . Zoloft [Sertraline Hcl] Swelling    Red spots all over face, swelling of tongue and legs.   . Ciprofloxacin Nausea And Vomiting    Other reaction(s): Abdominal Pain  . Depakene [Valproate Sodium] Hives    All over body   . Isoptin Sr [Verapamil Hcl Er] Cough  . Lisinopril Cough    Fatigue and cough  . Sulfa Antibiotics Rash    Rash and also throat was tight  . Telmisartan-Hctz Cough    Vital Signs: Temp: 93.2 F (34 C) (03/11 1000) Temp Source: Core (Comment) (03/11 1000) BP: 107/55 mmHg (03/11 1000) Pulse Rate: 55 (03/11 1000) Intake/Output from previous day: 03/10 0701 - 03/11 0700 In: 3034.5 [I.V.:2469.5; IV Piggyback:565] Out: 1095 [Urine:1095] Intake/Output from this shift: Total I/O In: 1101.9 [I.V.:301.9; IV Piggyback:800] Out: 75 [Urine:75]  Labs:  Recent Labs  11/08/15 0243 11/08/15 1806 11/09/15 0300 11/10/15 0630  WBC 5.9  --  5.5 4.8  HGB 9.7*  --  10.6* 8.7*  PLT 270  --  279 351  CREATININE 0.38* 0.33* 0.33* 0.38*   Estimated Creatinine Clearance: 44.1 mL/min (by C-G formula based on Cr of  0.38).  Recent Labs  11/08/15 1806  Sholes 7*      Medical History: Past Medical History  Diagnosis Date  . Depression   . Hypertension   . High cholesterol   . Rectal prolapse   . Hx of adenomatous colonic polyps   . Internal hemorrhoids   . Hypothyroidism   . Fecal incontinence   . TIA (transient ischemic attack)   . History of frequent urinary tract infections     recent  . Migraine   . Fatty liver 03/20/13  . Generalized ischemic cerebrovascular disease 10/09/2015  . Stroke [facility]) Sept 1, 2010; Sept 12, 2011    Medications:  Scheduled:  . antiseptic oral rinse  7 mL Mouth Rinse 10 times per day  . chlorhexidine gluconate  15 mL Mouth Rinse BID  . enoxaparin (LOVENOX) injection  40 mg Subcutaneous QHS  . feeding supplement (VITAL HIGH PROTEIN)  1,000 mL Per Tube Q24H  . lacosamide (VIMPAT) IV  200 mg Intravenous Q12H  . levETIRAcetam  1,500 mg Intravenous Q12H  . pantoprazole sodium  40 mg Per Tube Q24H  . phenytoin (DILANTIN) IV  100 mg Intravenous 3 times per day  . sodium chloride  500 mL Intravenous Once  . vancomycin  750 mg Intravenous Q12H    Assessment: 82 yo female here with seizures on keppra, lacosamide and phenytoin. She was recently admitted for status epilepticus and discharged 3/4 on keppra.  Pharmacy has been consulted to dose phenytoin (started 3/7). -3/8 phenytoin level= 15.8 (albumin= 2.7, corrected= 24) collected about 2 hours post dose -per MD noted there has been no re-occurence of seizures, continuous EEG showing burst suppression   Goal of Therapy:  phenytoin level= 10-20  Plan:  -Continue phenytoin 100mg  IV q8h -Will recheck a phenytoin level in 5-7 days after intiation (~ 3/13-3/14)    Hughes Better, PharmD, BCPS Clinical Pharmacist 11/10/2015 11:30 AM

## 2015-11-10 NOTE — Progress Notes (Signed)
Subjective: Intubated and on mechanical ventilation as well as sedated with markedly suppressed CNS activity with Versed and propofol.  Objective: Current vital signs: BP 155/67 mmHg  Pulse 60  Temp(Src) 93.9 F (34.4 C) (Rectal)  Resp 16  Wt 57.4 kg (126 lb 8.7 oz)  SpO2 99%  Neurologic Exam: Vital signs currently stable. Pupils were equal and reacted normally to light. Extraocular movements were absent with oculocephalic maneuvers. Face was symmetrical. Muscle tone was flaccid throughout. She had no spontaneous movements of extremities.  Continuous EEG recording is currently showing a burst suppression pattern with mostly suppressed activity with infrequent low amplitude occurrences of nonspecific cerebral activity diffusely.  Medications: I have reviewed the patient's current medications.  Assessment/Plan: 82 year old lady with encephalopathic state with status epilepticus, with control of seizure activity electrographically with anticonvulsant medication, but persistent PLEDs with associated persistent encephalopathic state. She responded previously when treated with intubation and parenteral sedation to achieve burst suppression state. She has been reintubated and is currently on propofol and Versed drips. She is currently markedly suppressed with a burst suppression pattern.  I will continue to titrate Versed and propofol for optimal state of burst suppression. She will also continue on anticonvulsant medication (Vimpat, Keppra and Dilantin) at current doses. Weaning from burst suppression will likely begin on 11/11/2015.  C.R. Nicole Kindred, MD [facility] [phone]  11/10/2015  9:42 AM

## 2015-11-11 ENCOUNTER — Inpatient Hospital Stay (HOSPITAL_COMMUNITY): Payer: Medicare Other

## 2015-11-11 DIAGNOSIS — L8990 Pressure ulcer of unspecified site, unspecified stage: Secondary | ICD-10-CM | POA: Insufficient documentation

## 2015-11-11 LAB — CBC
HCT: 27.6 % — ABNORMAL LOW (ref 36.0–46.0)
HEMOGLOBIN: 8.9 g/dL — AB (ref 12.0–15.0)
MCH: 29.9 pg (ref 26.0–34.0)
MCHC: 32.2 g/dL (ref 30.0–36.0)
MCV: 92.6 fL (ref 78.0–100.0)
PLATELETS: 231 10*3/uL (ref 150–400)
RBC: 2.98 MIL/uL — AB (ref 3.87–5.11)
RDW: 14.8 % (ref 11.5–15.5)
WBC: 4.3 10*3/uL (ref 4.0–10.5)

## 2015-11-11 LAB — GLUCOSE, CAPILLARY
GLUCOSE-CAPILLARY: 104 mg/dL — AB (ref 65–99)
GLUCOSE-CAPILLARY: 126 mg/dL — AB (ref 65–99)
GLUCOSE-CAPILLARY: 97 mg/dL (ref 65–99)
Glucose-Capillary: 110 mg/dL — ABNORMAL HIGH (ref 65–99)
Glucose-Capillary: 98 mg/dL (ref 65–99)
Glucose-Capillary: 126 mg/dL — ABNORMAL HIGH (ref 65–99)

## 2015-11-11 LAB — BASIC METABOLIC PANEL
ANION GAP: 11 (ref 5–15)
BUN: 9 mg/dL (ref 6–20)
CALCIUM: 8.6 mg/dL — AB (ref 8.9–10.3)
CO2: 22 mmol/L (ref 22–32)
Chloride: 102 mmol/L (ref 101–111)
Creatinine, Ser: 0.43 mg/dL — ABNORMAL LOW (ref 0.44–1.00)
Glucose, Bld: 164 mg/dL — ABNORMAL HIGH (ref 65–99)
Potassium: 3 mmol/L — ABNORMAL LOW (ref 3.5–5.1)
Sodium: 135 mmol/L (ref 135–145)

## 2015-11-11 MED ORDER — SODIUM CHLORIDE 0.9 % IV SOLN
INTRAVENOUS | Status: DC
  Administered 2015-11-11: 10 mL/h via INTRAVENOUS
  Administered 2015-11-14 – 2015-11-26 (×2): via INTRAVENOUS

## 2015-11-11 MED ORDER — FUROSEMIDE 10 MG/ML IJ SOLN
40.0000 mg | Freq: Once | INTRAMUSCULAR | Status: AC
  Administered 2015-11-11: 40 mg via INTRAVENOUS
  Filled 2015-11-11: qty 4

## 2015-11-11 NOTE — Progress Notes (Signed)
Subjective: No recurrence of seizure activity reported. Patient has remained intubated and on mechanical ventilation and in a burst suppression pattern overnight. She is being sedated with propofol and Versed.  Objective: Current vital signs: BP 157/69 mmHg  Pulse 78  Temp(Src) 97.9 F (36.6 C) (Other (Comment))  Resp 16  Wt 57.9 kg (127 lb 10.3 oz)  SpO2 100%  Neurologic Exam: No response, including to noxious stimuli. Extraocular movements were absent with oculocephalic maneuvers. Face was symmetrical with no focal weakness. Muscle tone was flaccid throughout, and symmetrical.  Medications: I have reviewed the patient's current medications.  Assessment/Plan: 82 year old lady with encephalopathic state recurrent status epilepticus which appears to be controlled at this point. However patient had persistent PLEDs which were refractory to lorazepam, as well as current EDD regimen.  Recommend no changes in current management. We will continue with bursts suppression pattern overnight with plans to slowly wean propofol and Versed in the a.m.  C.R. Nicole Kindred, MD [facility] [phone]  11/11/2015  11:35 AM

## 2015-11-11 NOTE — Progress Notes (Signed)
PULMONARY / CRITICAL CARE MEDICINE   Name: Kathryn [name] MRN: [id number] DOB: 10-30-1930    ADMISSION DATE:  11/04/2015 CONSULTATION DATE:  11/09/15  REFERRING MD:  Neurology / Dr. Nicole Kindred   CHIEF COMPLAINT: Seizure   SUBJECTIVE:   Remains heavily sedated.  VITAL SIGNS: BP 147/69 mmHg  Pulse 79  Temp(Src) 97.9 F (36.6 C) (Other (Comment))  Resp 16  Wt 127 lb 10.3 oz (57.9 kg)  SpO2 100%  VENTILATOR SETTINGS: Vent Mode:  [-] PRVC FiO2 (%):  [40 %] 40 % Set Rate:  [16 bmp] 16 bmp Vt Set:  [420 mL] 420 mL PEEP:  [5 cmH20] 5 cmH20 Plateau Pressure:  [14 cmH20-15 cmH20] 14 cmH20  INTAKE / OUTPUT: I/O last 3 completed shifts: In: 5867.7 [I.V.:4272.2; NG/GT:335.5; IV Piggyback:1260] Out: 3415 [Urine:3415]  PHYSICAL EXAMINATION: General: sedated Neuro: RASS -4 HEENT: ETT in place Cardiac: regular Chest: no wheeze Abd: soft, non tender Ext: no edema Skin: sores on heels  LABS:  BMET  Recent Labs Lab 11/08/15 1806 11/09/15 0300 11/10/15 0630  NA 134* 134* 133*  K 4.2 4.1 3.5  CL 101 100* 101  CO2 23 24 23   BUN 6 <5* 9  CREATININE 0.33* 0.33* 0.38*  GLUCOSE 112* 95 132*    Electrolytes  Recent Labs Lab 11/07/15 0300 11/08/15 0243 11/08/15 1806 11/09/15 0300 11/10/15 0630  CALCIUM 8.5* 8.6* 9.2 8.9 9.0  MG 1.3* 2.2  --   --   --     CBC  Recent Labs Lab 11/08/15 0243 11/09/15 0300 11/10/15 0630  WBC 5.9 5.5 4.8  HGB 9.7* 10.6* 8.7*  HCT 28.5* 32.8* 26.7*  PLT 270 279 351    Coag's No results for input(s): APTT, INR in the last 168 hours.  Sepsis Markers  Recent Labs Lab 11/05/15 0155 11/05/15 0200 11/05/15 1755  LATICACIDVEN 1.4 1.19 2.2*    ABG  Recent Labs Lab 11/05/15 1821 11/09/15 1750  PHART 7.445 7.470*  PCO2ART 35.7 33.9*  PO2ART 82.0 204*    Liver Enzymes  Recent Labs Lab 11/04/15 2236 11/05/15 1746 11/08/15 1806  AST 84* 55* 44*  ALT 122* 100* 50  ALKPHOS 92 92 78  BILITOT 0.7 0.6 0.4  ALBUMIN 2.8*  2.7* 2.2*    Cardiac Enzymes No results for input(s): TROPONINI, PROBNP in the last 168 hours.  Glucose  Recent Labs Lab 11/10/15 1156 11/10/15 1545 11/10/15 1946 11/10/15 2335 11/11/15 0400 11/11/15 0756  GLUCAP 91 89 99 130* 104* 110*    Imaging Dg Abd Portable 1v  11/10/2015  CLINICAL DATA:  82 year old female with history of orogastric tube placement. EXAM: PORTABLE ABDOMEN - 1 VIEW COMPARISON:  10/30/2015. FINDINGS: Previously noted feeding tube has been removed. Orogastric tube in position, although the stomach, with tip in the proximal antrum. Gas and stool are seen scattered throughout the colon extending to the level of the distal rectum. No pathologic distension of small bowel is noted. No gross evidence of pneumoperitoneum. Surgical clips project over the right upper quadrant of the abdomen, compatible with prior cholecystectomy. Atherosclerotic calcifications throughout the visualized vasculature. IMPRESSION: 1. Tip of orogastric tube is in the proximal antrum of the stomach. 2. Atherosclerosis. Electronically Signed   By: Vinnie Langton M.D.   On: 11/10/2015 11:08     STUDIES:  3/10 Continuous EEG >> Left hemispheric PLEDS were present with maximum negativity in the left posterotemporal/parietal cortex, although less developed, and less organized essentially unchanged compare to day 3 of recording  There  were no subclinical seizures  CULTURES: HIV 3/6 >> negative  BCx2 3/6 >> negative UC 3/6 >> enterococcus >> pan sensitive   ANTIBIOTICS: Aztreonam 3/6 >> 3/6  Fortaz 3/6 >> 3/8 Vanco 3/6 >>  SIGNIFICANT EVENTS: 3/04  Discharged after episode of status epilepticus 3/05  Readmitted  LINES/TUBES: ETT 3/10 >> Lt IJ CVL 3/11 >>  DISCUSSION: [name] with PMH of CVA and recent admission for status epilepticus readmitted 3/5 for repeat seizure activity.  Despite AED's, the patient continues to have seizures on continuous EEG.  PCCM called for elective  intubation for deep sedation to control seizures.   ASSESSMENT / PLAN:   NEUROLOGIC A:   Acute Encephalopathy in setting of Status Epilepticus Hx CVA, Parkinson's Disease P:   RASS goal: -4 Versed gtt to control seizures Continue Vimpat, Keppra, Dilantin & Ativan  Foot drop boots  PULMONARY A: OSA - on CPAP Hx Difficult Airway  Compromised airway in setting of seizurex P:   Full vent support until neuro status improved   CARDIOVASCULAR A:  Hypertension  DNR  P:  Monitor hemodynamics  RENAL A:   Hypervolemia. P:   KVO IV fluids Lasix 40 mg IV x one 3/12  GASTROINTESTINAL A:   Nutrition. P:   Tube feeds Protonix for SUP  HEMATOLOGIC A:   Anemia of critical illness and chronic disease P:  Trend CBC  Lovenox for DVT prophylaxis   INFECTIOUS A:   Enterococcus UTI >> multiple Abx allergies P:   Day 7/7 vancomycin >> d/c after dose on 3/12  ENDOCRINE A:   No acute issues  P:   Monitor glucose on BMP   D/w Dr. Nicole Kindred.  CC time 31 minutes.  Chesley Mires, MD [facility] Pulmonary/Critical Care 11/11/2015, 8:37 AM Pager:  [phone] After 3pm call: [phone]

## 2015-11-11 NOTE — Progress Notes (Signed)
Orthopedic Tech Progress Note Patient Details:  Kathryn [name] 11/14/1930 [id number]  Ortho Devices Type of Ortho Device: Postop shoe/boot Ortho Device/Splint Location: Bilateral heel boot Ortho Device/Splint Interventions: Application   [place] Pink 11/11/2015, 1:15 PM

## 2015-11-11 NOTE — Progress Notes (Signed)
Per Dr. Nicole Kindred, RN is not to perform a wake up assessment on pt.  Sherril Cong, RN

## 2015-11-11 NOTE — Progress Notes (Signed)
Per Dr. Nicole Kindred, pt is NOT to have a wake up assessment performed. Sherril Cong, RN

## 2015-11-11 NOTE — Procedures (Signed)
  Electroencephalogram report- LTM   Ordering Physician : Dr Nicole Kindred   Data acquisition: 10-20 electrode placement.  Additional T1, T2, and EKG electrodes; 26 channel digital referential acquisition reformatted to 18 channel/7 channel coronal bipolar     Beginning time: 11/05/15 at 10;34:05 am Ending time:3/12 /17 08:40:23 am  Day of study: day 1 , day 2 , day 3 , day 4 , day 5, day 6     This intensive EEG monitoring with simultaneous video monitoring was performed for this patient with unresponsiveness and facial twitching  as a part of ongoing series to capture events of interest and determine if these are seizures.    Medications: as per EMR  Day 1: There was no pushbutton activations events during this recording. EEG technologist noted facial twitching without specific lateralization. Due to sub optimal video unable to characterize further.  Background activities are marked by reactive background slowing 2-3 cps delta with superimposed 5-6 cps theta distributed broadly.  Superimposed non remitting left hemispheric PLEDS were present with maximum negativity in the left posterotemporal/parietal cortex.  Superimposed there is near continuous muscle twitching artifact was present obscuring EEG tracing. Muscle twitching artifact is the most prominent in the left frontal/fronto polar heels. Less prominent right fronto polar muscle artifact was also present.   Day 2: Continuous  left hemispheric PLEDs were present with maximum negativity in the left posterotemporal/parietal cortex, T5.  Since 4 pm left periodic lateralized epileptiform discharges PLEDs  become more organized , rhythmic,  synchronized with evolving features and much broader  negative field extending to the right hemisphere consistent with electrographic seizures and nonconvulsive status epilepticus.  Superimposed there is near continuous muscle twitching artifact was present and most likely attributable to the facial  twitching.  Day 3: Left hemispheric PLEDs were present with maximum negativity in the left posterotemporal/parietal cortex, although less developed, and less organized There were no subclinical seizures.   Day 4:  Left hemispheric PLEDs were present with maximum negativity in the left posterotemporal/parietal cortex, although less developed, and less organized essentially unchanged compare to day 3  of recording  There were no subclinical seizures.   Day 5:  Left hemispheric PLEDs were present with gradual  resolution and development of burst suppression around midnight. Burst suppression with 2-3 seconds burst and 7 seconds of suppression was sustained until 5 am . It that point suppressed background has dominated .   Day 6:  Burst suppression  with 3 seconds bursts and 4-5 seconds of suppression was present throughout the recording. No PLEDs or seizures present, No IED  Clinical interpretation: This intensive EEG monitoring with simultaneous monitoring demonstrates persistent burst suppression pattern during last 24 hours of the recording with modified  sedation as discussed above. There were no PLEDs, seizures or IED present.

## 2015-11-12 ENCOUNTER — Inpatient Hospital Stay (HOSPITAL_COMMUNITY): Payer: Medicare Other

## 2015-11-12 DIAGNOSIS — G4733 Obstructive sleep apnea (adult) (pediatric): Secondary | ICD-10-CM

## 2015-11-12 DIAGNOSIS — J9601 Acute respiratory failure with hypoxia: Secondary | ICD-10-CM

## 2015-11-12 LAB — BASIC METABOLIC PANEL
ANION GAP: 7 (ref 5–15)
BUN: 14 mg/dL (ref 6–20)
CO2: 26 mmol/L (ref 22–32)
Calcium: 8.4 mg/dL — ABNORMAL LOW (ref 8.9–10.3)
Chloride: 104 mmol/L (ref 101–111)
Creatinine, Ser: 0.41 mg/dL — ABNORMAL LOW (ref 0.44–1.00)
GFR calc Af Amer: >60 mL/min (ref >60)
GLUCOSE: 141 mg/dL — AB (ref 65–99)
POTASSIUM: 2.8 mmol/L — AB (ref 3.5–5.1)
Sodium: 137 mmol/L (ref 135–145)

## 2015-11-12 LAB — GLUCOSE, CAPILLARY
GLUCOSE-CAPILLARY: 127 mg/dL — AB (ref 65–99)
GLUCOSE-CAPILLARY: 114 mg/dL — AB (ref 65–99)
Glucose-Capillary: 104 mg/dL — ABNORMAL HIGH (ref 65–99)
Glucose-Capillary: 129 mg/dL — ABNORMAL HIGH (ref 65–99)
Glucose-Capillary: 108 mg/dL — ABNORMAL HIGH (ref 65–99)

## 2015-11-12 LAB — CBC
HEMATOCRIT: 25.8 % — AB (ref 36.0–46.0)
Hemoglobin: 8.3 g/dL — ABNORMAL LOW (ref 12.0–15.0)
MCH: 29.7 pg (ref 26.0–34.0)
MCHC: 32.2 g/dL (ref 30.0–36.0)
MCV: 92.5 fL (ref 78.0–100.0)
Platelets: 277 10*3/uL (ref 150–400)
RBC: 2.79 MIL/uL — AB (ref 3.87–5.11)
RDW: 14.9 % (ref 11.5–15.5)
WBC: 5.9 10*3/uL (ref 4.0–10.5)

## 2015-11-12 LAB — MRSA PCR SCREENING: MRSA BY PCR: NEGATIVE

## 2015-11-12 MED ORDER — POTASSIUM CHLORIDE 20 MEQ/15ML (10%) PO SOLN
40.0000 meq | ORAL | Status: AC
  Administered 2015-11-12 (×3): 40 meq via ORAL
  Filled 2015-11-12 (×3): qty 30

## 2015-11-12 MED ORDER — FUROSEMIDE 10 MG/ML IJ SOLN
40.0000 mg | Freq: Once | INTRAMUSCULAR | Status: AC
Start: 2015-11-12 — End: 2015-11-12
  Administered 2015-11-12: 40 mg via INTRAVENOUS
  Filled 2015-11-12: qty 4

## 2015-11-12 MED ORDER — POTASSIUM CHLORIDE 20 MEQ PO PACK
40.0000 meq | PACK | ORAL | Status: DC
  Filled 2015-11-12: qty 2

## 2015-11-12 NOTE — Progress Notes (Signed)
Interval History:                                                                                                                      Kathryn [name] is an 82 y.o. female patient readmitted with status epilepticus, now on propofol and Versed infusions. Long-term EEG monitoring shows burst suppression pattern, with no seizures. Patient intubated.    Past Medical History: Past Medical History  Diagnosis Date  . Depression   . Hypertension   . High cholesterol   . Rectal prolapse   . Hx of adenomatous colonic polyps   . Internal hemorrhoids   . Hypothyroidism   . Fecal incontinence   . TIA (transient ischemic attack)   . History of frequent urinary tract infections     recent  . Migraine   . Fatty liver 03/20/13  . Generalized ischemic cerebrovascular disease 10/09/2015  . Stroke [facility]) Sept 1, 2010; Sept 12, 2011    Past Surgical History  Procedure Laterality Date  . Colon surgery  2010, for prolapsed organs after hystecrtomy surgery  . Appendectomy  2008  . Cholecystectomy    . Lumbar laminectomy/decompression microdiscectomy N/A 01/07/2013    Procedure: LUMBAR LAMINECTOMY CENTRAL DECOMPRESSION L4-L5, BILATERAL FORAMENOTOMY L4,L5    ;  Surgeon: Tobi Bastos, MD;  Location: [facility];  Service: Orthopedics;  Laterality: N/A;  . Back surgery    . Hemorrhoid surgery  09/2008    Archie Endo 12/31/2010  . Abdominal hysterectomy  05/2007    Archie Endo 01/02/2011    Family History: Family History  Problem Relation Age of Onset  . Stroke Father   . Migraines Father   . CVA Father   . Heart attack Father   . Hypertension Maternal Aunt     x3  . Colon cancer Neg Hx   . CVA Mother     Social History:   reports that she has never smoked. She has never used smokeless tobacco. She reports that she does not drink alcohol or use illicit drugs.  Allergies:  Allergies  Allergen Reactions  . Augmentin [Amoxicillin-Pot Clavulanate] Swelling and Diarrhea    Throat   . Citalopram Swelling    Eyes  ears and throat swelling Throat and eyes   . Codeine Anaphylaxis and Shortness Of Breath  . Fish-Derived Products Anaphylaxis  . Hyoscyamine Anaphylaxis and Swelling  . Ibuprofen Swelling    Throat and eyes   . Latex Swelling and Rash    Swelling to troat  . Lipitor [Atorvastatin] Swelling    Muscle aches throat  . Septra [Bactrim] Anaphylaxis  . Shellfish Allergy Anaphylaxis  . Miconazole Rash  . Keflet [Cephalexin] Diarrhea    Upset stomach  . Meloxicam Diarrhea and Nausea And Vomiting  . Verapamil Other (See Comments)    Tachycardia and flushing  . Vicodin [Hydrocodone-Acetaminophen] Other (See Comments)    stroke  . Zoloft [Sertraline Hcl] Swelling    Red spots all over face, swelling of tongue and legs.   Marland Kitchen  Ciprofloxacin Nausea And Vomiting    Other reaction(s): Abdominal Pain  . Depakene [Valproate Sodium] Hives    All over body   . Isoptin Sr [Verapamil Hcl Er] Cough  . Lisinopril Cough    Fatigue and cough  . Sulfa Antibiotics Rash    Rash and also throat was tight  . Telmisartan-Hctz Cough     Medications:                                                                                                                         Current facility-administered medications:  .  0.9 %  sodium chloride infusion, , Intravenous, Continuous, Chesley Mires, MD, Last Rate: 10 mL/hr at 11/11/15 0959, 10 mL/hr at 11/11/15 0959 .  antiseptic oral rinse solution (CORINZ), 7 mL, Mouth Rinse, 10 times per day, Javier Glazier, MD, 7 mL at 11/12/15 1616 .  bisacodyl (DULCOLAX) suppository 10 mg, 10 mg, Rectal, Daily PRN, Donita Brooks, NP .  chlorhexidine gluconate (PERIDEX) 0.12 % solution 15 mL, 15 mL, Mouth Rinse, BID, Javier Glazier, MD, 15 mL at 11/12/15 0757 .  enoxaparin (LOVENOX) injection 40 mg, 40 mg, Subcutaneous, QHS, Wynell Balloon, RPH, 40 mg at 11/11/15 2147 .  feeding supplement (PRO-STAT SUGAR FREE 64) liquid 30 mL, 30 mL, Oral, BID, Javier Glazier, MD, 30  mL at 11/12/15 0950 .  feeding supplement (VITAL HIGH PROTEIN) liquid 1,000 mL, 1,000 mL, Per Tube, Q24H, Chesley Mires, MD, 1,000 mL at 11/12/15 1616 .  fentaNYL (SUBLIMAZE) injection 50 mcg, 50 mcg, Intravenous, Q2H PRN, Donita Brooks, NP .  hydrALAZINE (APRESOLINE) injection 10 mg, 10 mg, Intravenous, Q6H PRN, Nishant Dhungel, MD, 10 mg at 11/07/15 2256 .  lacosamide (VIMPAT) 200 mg in sodium chloride 0.9 % 25 mL IVPB, 200 mg, Intravenous, Q12H, Greta Doom, MD, 200 mg at 11/12/15 1404 .  levETIRAcetam (KEPPRA) 1,500 mg in sodium chloride 0.9 % 100 mL IVPB, 1,500 mg, Intravenous, Q12H, Allie Bossier, MD, Last Rate: 460 mL/hr at 11/12/15 0950, 1,500 mg at 11/12/15 0950 .  midazolam (VERSED) 100 mg in sodium chloride 0.9 % 100 mL (1 mg/mL) infusion, 30 mg/hr, Intravenous, Titrated, Greta Doom, MD, Last Rate: 10 mL/hr at 11/12/15 1700, 10 mg/hr at 11/12/15 1700 .  pantoprazole sodium (PROTONIX) 40 mg/20 mL oral suspension 40 mg, 40 mg, Per Tube, Q24H, Chesley Mires, MD, 40 mg at 11/12/15 0757 .  phenylephrine (NEO-SYNEPHRINE) 40 mg in dextrose 5 % 250 mL (0.16 mg/mL) infusion, 0-100 mcg/min, Intravenous, Titrated, Javier Glazier, MD, Last Rate: 4.5 mL/hr at 11/12/15 1700, 12 mcg/min at 11/12/15 1700 .  phenytoin (DILANTIN) injection 100 mg, 100 mg, Intravenous, 3 times per day, Wallie Char, 100 mg at 11/12/15 1333 .  pneumococcal 23 valent vaccine (PNU-IMMUNE) injection 0.5 mL, 0.5 mL, Intramuscular, Prior to discharge, Velvet Bathe, MD .  potassium chloride 20 MEQ/15ML (10%) solution 40 mEq, 40 mEq, Oral, Q4H, Javier Glazier, MD, 40 mEq  at 11/12/15 1511 .  propofol (DIPRIVAN) 1000 MG/100ML infusion, 5-80 mcg/kg/min, Intravenous, Titrated, Greta Doom, MD, Last Rate: 10.3 mL/hr at 11/12/15 1700, 30 mcg/kg/min at 11/12/15 1700 .  sodium chloride 0.9 % bolus 500 mL, 500 mL, Intravenous, Once, Gardiner Barefoot, NP   Neurologic Examination:                                                                                                      Today's Vitals   11/12/15 1700 11/12/15 1715 11/12/15 1730 11/12/15 1745  BP: 107/66 114/58 122/65 121/60  Pulse: 84 82 83 81  Temp: 98.6 F (37 C) 98.6 F (37 C) 98.6 F (37 C) 98.4 F (36.9 C)  TempSrc:      Resp: 16 16 16 16   Weight:      SpO2: 100% 100% 100% 100%  PainSc:        Patient intubated, sedated with propofol and Versed infusions. Pupils reactive. No motor response to stimulation, likely sedation effect .   Lab Results: Basic Metabolic Panel:  Recent Labs Lab 11/07/15 0300 11/08/15 0243 11/08/15 1806 11/09/15 0300 11/10/15 0630 11/11/15 1015 11/12/15 0625  NA 134* 133* 134* 134* 133* 135 137  K 3.2* 3.0* 4.2 4.1 3.5 3.0* 2.8*  CL 102 99* 101 100* 101 102 104  CO2 23 24 23 24 23 22 26   GLUCOSE 159* 152* 112* 95 132* 164* 141*  BUN 8 6 6  <5* 9 9 14   CREATININE 0.42* 0.38* 0.33* 0.33* 0.38* 0.43* 0.41*  CALCIUM 8.5* 8.6* 9.2 8.9 9.0 8.6* 8.4*  MG 1.3* 2.2  --   --   --   --   --     Liver Function Tests:  Recent Labs Lab 11/08/15 1806  AST 44*  ALT 50  ALKPHOS 78  BILITOT 0.4  PROT 5.7*  ALBUMIN 2.2*   No results for input(s): LIPASE, AMYLASE in the last 168 hours.  Recent Labs Lab 11/08/15 1809  AMMONIA 34    CBC:  Recent Labs Lab 11/08/15 0243 11/09/15 0300 11/10/15 0630 11/11/15 1015 11/12/15 0625  WBC 5.9 5.5 4.8 4.3 5.9  HGB 9.7* 10.6* 8.7* 8.9* 8.3*  HCT 28.5* 32.8* 26.7* 27.6* 25.8*  MCV 91.6 92.7 92.1 92.6 92.5  PLT 270 279 351 231 277    Cardiac Enzymes: No results for input(s): CKTOTAL, CKMB, CKMBINDEX, TROPONINI in the last 168 hours.  Lipid Panel:  Recent Labs Lab 11/10/15 0132  TRIG 133    CBG:  Recent Labs Lab 11/11/15 2322 11/12/15 0340 11/12/15 0748 11/12/15 1136 11/12/15 1532  GLUCAP 126* 104* 127* 129* 108*    Microbiology: Results for orders placed or performed during the hospital encounter of 11/04/15   Culture, Urine     Status: None   Collection Time: 11/05/15 12:05 AM  Result Value Ref Range Status   Specimen Description Urine  Final   Special Requests NONE  Final   Culture 70,000 COLONIES/ml ENTEROCOCCUS SPECIES  Final   Report Status 11/07/2015 FINAL  Final   Organism ID, Bacteria ENTEROCOCCUS SPECIES  Final      Susceptibility   Enterococcus species - MIC*    AMPICILLIN <=2 SENSITIVE Sensitive     VANCOMYCIN 1 SENSITIVE Sensitive     GENTAMICIN SYNERGY SENSITIVE Sensitive     * 70,000 COLONIES/ml ENTEROCOCCUS SPECIES  Culture, blood (routine x 2)     Status: None   Collection Time: 11/05/15  9:50 AM  Result Value Ref Range Status   Specimen Description BLOOD RIGHT HAND  Final   Special Requests IN PEDIATRIC BOTTLE 1CC  Final   Culture NO GROWTH 5 DAYS  Final   Report Status 11/10/2015 FINAL  Final  Culture, blood (routine x 2)     Status: None   Collection Time: 11/05/15  9:53 AM  Result Value Ref Range Status   Specimen Description BLOOD RIGHT ANTECUBITAL  Final   Special Requests BOTTLES DRAWN AEROBIC AND ANAEROBIC 5CC  Final   Culture NO GROWTH 5 DAYS  Final   Report Status 11/10/2015 FINAL  Final    Imaging: Dg Chest Port 1 View  11/12/2015  CLINICAL DATA:  Patient with history of respiratory failure. EXAM: PORTABLE CHEST 1 VIEW COMPARISON:  Chest radiograph 11/11/2015. FINDINGS: ET tube terminates in the distal trachea. Enteric tube courses inferior to the diaphragm. Stable cardiac and mediastinal contours. No consolidative pulmonary opacities. Possible small bilateral pleural effusions. Left IJ central venous catheter tip projects over the superior vena cava. IMPRESSION: Stable support apparatus. Possible small bilateral pleural effusions. Electronically Signed   By: Lovey Newcomer M.D.   On: 11/12/2015 08:06   Dg Chest Port 1 View  11/11/2015  CLINICAL DATA:  82 year old female with history of respiratory failure. EXAM: PORTABLE CHEST 1 VIEW COMPARISON:  Chest x-ray  11/10/2015. FINDINGS: An endotracheal tube is in place with tip 3.5 cm above the carina. Lung volumes are slightly low. There are minimal bibasilar opacities most compatible with areas of mild subsegmental atelectasis. Trace bilateral pleural effusions. No consolidative airspace disease. No evidence of pulmonary edema. Heart size and mediastinal contours are within normal limits. Atherosclerosis in the thoracic aorta. IMPRESSION: 1. Support apparatus, as above. 2. Trace bilateral pleural effusions with minimal bibasilar subsegmental atelectasis. Electronically Signed   By: Vinnie Langton M.D.   On: 11/11/2015 10:09   Dg Chest Port 1 View  11/10/2015  CLINICAL DATA:  Seizure. EXAM: PORTABLE CHEST 1 VIEW COMPARISON:  Yesterday at 1709 hour FINDINGS: Endotracheal tube is 2.9 cm from the carina. There is a new left central line, tip in the mid SVC. No pneumothorax. Patient is rotated to the right. The cardiomediastinal contours are unchanged. Developing hazy opacity at the lung bases, question pleural effusions. Probable bibasilar atelectasis. IMPRESSION: 1. Tip of the left central line in the SVC. No pneumothorax. Endotracheal tube remains in place. 2. Question developing pleural effusions. Electronically Signed   By: Jeb Levering M.D.   On: 11/10/2015 03:52   Dg Chest Port 1 View  11/09/2015  CLINICAL DATA:  Intubation for status epilepticus. EXAM: PORTABLE CHEST 1 VIEW COMPARISON:  11/05/2015 and prior exams FINDINGS: Endotracheal tube is identified with tip 3.6 cm above the carina. The cardiomediastinal silhouette is unchanged. Minimal basilar atelectasis noted. There is no evidence of focal airspace disease, pulmonary edema, suspicious pulmonary nodule/mass, pleural effusion, or pneumothorax. No acute bony abnormalities are identified. IMPRESSION: Endotracheal tube placement with tip 3.6 cm above the carina. Minimal basilar atelectasis. Electronically Signed   By: Margarette Canada M.D.   On: 11/09/2015 17:24    Dg Abd  Portable 1v  11/10/2015  CLINICAL DATA:  82 year old female with history of orogastric tube placement. EXAM: PORTABLE ABDOMEN - 1 VIEW COMPARISON:  10/30/2015. FINDINGS: Previously noted feeding tube has been removed. Orogastric tube in position, although the stomach, with tip in the proximal antrum. Gas and stool are seen scattered throughout the colon extending to the level of the distal rectum. No pathologic distension of small bowel is noted. No gross evidence of pneumoperitoneum. Surgical clips project over the right upper quadrant of the abdomen, compatible with prior cholecystectomy. Atherosclerotic calcifications throughout the visualized vasculature. IMPRESSION: 1. Tip of orogastric tube is in the proximal antrum of the stomach. 2. Atherosclerosis. Electronically Signed   By: Vinnie Langton M.D.   On: 11/10/2015 11:08    Assessment and plan:   Kathryn [name] is an 82 y.o. female patient  readmitted with status epilepticus, now on propofol and Versed infusions. Long-term EEG monitoring shows burst suppression pattern, with no seizures.  Recommend gradually weaning off of propofol. We'll monitor EEG for any recurrence of abnormal discharges or seizures. No family at bedside.  On Vimpat, Keppra and Dilantin. Continue the same.  We'll follow-up.

## 2015-11-12 NOTE — Progress Notes (Signed)
PULMONARY / CRITICAL CARE MEDICINE   Name: Kathryn [name] MRN: [id number] DOB: 03-12-1931    ADMISSION DATE:  11/04/2015 CONSULTATION DATE:  11/09/15  REFERRING MD:  Neurology / Dr. Nicole Kindred   CHIEF COMPLAINT: Seizure   SUBJECTIVE:   Remains on propofol + versed Remains on phenylephrine   VITAL SIGNS: BP 101/56 mmHg  Pulse 74  Temp(Src) 97.7 F (36.5 C) (Core (Comment))  Resp 16  Wt 56.5 kg (124 lb 9 oz)  SpO2 100%  VENTILATOR SETTINGS: Vent Mode:  [-] PRVC FiO2 (%):  [40 %] 40 % Set Rate:  [16 bmp] 16 bmp Vt Set:  [420 mL] 420 mL PEEP:  [5 cmH20] 5 cmH20 Plateau Pressure:  [11 cmH20-15 cmH20] 15 cmH20  INTAKE / OUTPUT: I/O last 3 completed shifts: In: 3748.2 [I.V.:1968.2; NG/GT:850; IV Piggyback:930] Out: 4890 [Urine:4890]  PHYSICAL EXAMINATION: General: sedated Neuro: RASS -4 HEENT: ETT in place Cardiac: regular Chest: no wheeze Abd: soft, non tender Ext: no edema Skin: sores on heels  LABS:  BMET  Recent Labs Lab 11/10/15 0630 11/11/15 1015 11/12/15 0625  NA 133* 135 137  K 3.5 3.0* 2.8*  CL 101 102 104  CO2 23 22 26   BUN 9 9 14   CREATININE 0.38* 0.43* 0.41*  GLUCOSE 132* 164* 141*    Electrolytes  Recent Labs Lab 11/07/15 0300 11/08/15 0243  11/10/15 0630 11/11/15 1015 11/12/15 0625  CALCIUM 8.5* 8.6*  < > 9.0 8.6* 8.4*  MG 1.3* 2.2  --   --   --   --   < > = values in this interval not displayed.  CBC  Recent Labs Lab 11/10/15 0630 11/11/15 1015 11/12/15 0625  WBC 4.8 4.3 5.9  HGB 8.7* 8.9* 8.3*  HCT 26.7* 27.6* 25.8*  PLT 351 231 277    Coag's No results for input(s): APTT, INR in the last 168 hours.  Sepsis Markers  Recent Labs Lab 11/05/15 1755  LATICACIDVEN 2.2*    ABG  Recent Labs Lab 11/05/15 1821 11/09/15 1750  PHART 7.445 7.470*  PCO2ART 35.7 33.9*  PO2ART 82.0 204*    Liver Enzymes  Recent Labs Lab 11/05/15 1746 11/08/15 1806  AST 55* 44*  ALT 100* 50  ALKPHOS 92 78  BILITOT 0.6 0.4   ALBUMIN 2.7* 2.2*    Cardiac Enzymes No results for input(s): TROPONINI, PROBNP in the last 168 hours.  Glucose  Recent Labs Lab 11/11/15 1149 11/11/15 1613 11/11/15 1932 11/11/15 2322 11/12/15 0340 11/12/15 0748  GLUCAP 126* 97 98 126* 104* 127*    Imaging Dg Chest Port 1 View  11/12/2015  CLINICAL DATA:  Patient with history of respiratory failure. EXAM: PORTABLE CHEST 1 VIEW COMPARISON:  Chest radiograph 11/11/2015. FINDINGS: ET tube terminates in the distal trachea. Enteric tube courses inferior to the diaphragm. Stable cardiac and mediastinal contours. No consolidative pulmonary opacities. Possible small bilateral pleural effusions. Left IJ central venous catheter tip projects over the superior vena cava. IMPRESSION: Stable support apparatus. Possible small bilateral pleural effusions. Electronically Signed   By: Lovey Newcomer M.D.   On: 11/12/2015 08:06     STUDIES:  3/10 Continuous EEG >> Left hemispheric PLEDS were present with maximum negativity in the left posterotemporal/parietal cortex, although less developed, and less organized essentially unchanged compare to day 3 of recording  There were no subclinical seizures  CULTURES: HIV 3/6 >> negative  BCx2 3/6 >> negative UC 3/6 >> enterococcus >> pan sensitive   ANTIBIOTICS: Aztreonam 3/6 >> 3/6  Fortaz 3/6 >> 3/8 Vanco 3/6 >> 3/12  SIGNIFICANT EVENTS: 3/04  Discharged after episode of status epilepticus 3/05  Readmitted  LINES/TUBES: ETT 3/10 >> Lt IJ CVL 3/11 >>  DISCUSSION: [name] with PMH of CVA and recent admission for status epilepticus readmitted 3/5 for repeat seizure activity.  Despite AED's, the patient continues to have seizures on continuous EEG.  PCCM called for elective intubation for deep sedation to control seizures.   ASSESSMENT / PLAN:   NEUROLOGIC A:   Acute Encephalopathy in setting of Status Epilepticus Hx CVA, Parkinson's Disease P:   RASS goal: -4 Continuous EEG, still  some burst-suppression seizures on 3/12 Versed gtt to control seizures Continue Vimpat, Keppra, Dilantin & Ativan  Wean as per neuro recs Foot drop boots Will need to consider and discuss goals of care with family, especially if no resolution seizure activity by EEG  PULMONARY A: OSA - on CPAP Hx Difficult Airway  Compromised airway in setting of seizures P:   Full vent support until neuro status improved   CARDIOVASCULAR A:  Hx Hypertension  Shock, suspect related to medication / sedation although note has had enterococcal UTI DNR  P:  Monitor hemodynamics  RENAL A:   Hypervolemia. P:   KVO IV fluids Repeat lasix on 3/13  GASTROINTESTINAL A:   Nutrition. P:   Tube feeds Protonix for SUP  HEMATOLOGIC A:   Anemia of critical illness and chronic disease P:  Trend CBC  Lovenox for DVT prophylaxis   INFECTIOUS A:   Enterococcus UTI >> multiple Abx allergies P:   Completed 7 days vanco 3/12 Follow off abx for now  ENDOCRINE A:   No acute issues  P:   Monitor glucose on BMP    CC time 32 minutes.   Baltazar Apo, MD, PhD 11/12/2015, 10:06 AM [facility] [phone] or if no answer [phone]

## 2015-11-12 NOTE — Progress Notes (Signed)
Nutrition Follow-up  INTERVENTION:   Continue Vital High Protein @ 25 ml/hr via OG tube Continue 30 ml Prostat BID Provides: 800 kcal, 82 grams protein, and 502 ml H2O. TF regimen and propofol at current rate providing 1164 total kcal/day (95 % of kcal needs)  NUTRITION DIAGNOSIS:   Inadequate oral intake related to inability to eat as evidenced by NPO status. Ongoing.   GOAL:   Patient will meet greater than or equal to 90% of their needs Met.   MONITOR:   Vent status, Labs, I & O's, TF tolerance  ASSESSMENT:   82 y/o with history of depression, TIA, CVA, Parkinson's disease, seizure d/o, HTN, OSA, hypothyroidism, Polypharmacy. Patient was brought in by EMS from [facility] due to prolonged seizure activity.  Patient is currently intubated on ventilator support MV: 7.2 L/min Temp (24hrs), Avg:98.3 F (36.8 C), Min:97.7 F (36.5 C), Max:98.8 F (37.1 C)  Propofol: 13.8 ml/hr provides: 364 kcal per day from lipid Medications reviewed and include: KCl Labs reviewed: potassium low 2.8 CBG's: 104-136 OG tube Pt previously on 30M due to seizures. Went to [facility] and returned in less than 24 hours due to seizure. Pt now on continuous EEG.  Pt discussed during ICU rounds and with RN.    Diet Order:  Diet NPO time specified  Skin:  Wound (see comment) (Deep tissue injury left heel)  Last BM:  3/11  Height:   Ht Readings from Last 1 Encounters:  10/21/15 5' 3" (1.6 m)    Weight:   Wt Readings from Last 1 Encounters:  11/12/15 124 lb 9 oz (56.5 kg)    Ideal Body Weight:  52.3 kg  BMI:  Body mass index is 22.07 kg/(m^2).  Estimated Nutritional Needs:   Kcal:  1219 kcals  Protein:  73-84 g (1.4-1.6 g/kg bw)  Fluid:  > 1.5 L/day  EDUCATION NEEDS:   No education needs identified at this time  [place], [place], [place] Pager [phone] After Hours Pager

## 2015-11-12 NOTE — Procedures (Addendum)
Continuous Video-EEG Monitoring Report  Patient: Kathryn [name], Kathryn [name]     EEG No.ID: [id number]     DOB: [birth year] Age: 82 Room#3M10  MED REC NO: [id number] Gender: Female   TECH: Estil Daft     Physician: Winfield Cunas     Referring Physician: Wallie Char   Report Date: 11/12/2015   Study Duration: 11/11/2015 07:30 to 11/12/2015 07:30 CPT Code:  [id number] Diagnosis:  R41.82; R56.9  History: This is a 82 year old female presenting with altered mental status and recurrent seizures.  Video-EEG monitoring was performed to evaluate for seizures.  Technical Details:  Long-term video-EEG monitoring was performed using standard setting per the guidelines.  Briefly, a minimum of 21 electrodes were placed on scalp according to the International 10-20 or/and 10-10 Systems.  Supplemental electrodes were placed as needed.  Single EKG electrode was also used to detect cardiac arrhythmia.  Patient's behavior was continuously recorded on video simultaneously with EEG.  A minimum of 16 channels were used for data display.  Each epoch of study was reviewed manually daily and as needed using standard referential and bipolar montages.    EEG Description:  The background was discontinuous, consisting of periods of suppression (<10 uV) and periods of sharply contoured polymorphic delta and theta activity.  The suppression phase constituted about 60-80% of the record, consistent with burst suppression pattern.  Diffuse beta activity was also seen, which were most likely due to medication effect (e.g., propofol).  No posterior dominant rhythm or sleep architecture was present.  No epileptiform discharges or seizures were in evidence.  Impression:  This is an abnormal EEG due to the presence of burst suppression, likely medication-induced (e.g., propofol).  No epileptiform discharges or seizure were present.                      Reading Physician: Winfield Cunas, MD, PhD

## 2015-11-12 NOTE — Progress Notes (Signed)
Checked pt on LTM and reset electrodes as needed; no skin breakdown noted. Will continue to monitor.

## 2015-11-13 LAB — BASIC METABOLIC PANEL
ANION GAP: 5 (ref 5–15)
BUN: 17 mg/dL (ref 6–20)
CO2: 27 mmol/L (ref 22–32)
Calcium: 8.7 mg/dL — ABNORMAL LOW (ref 8.9–10.3)
Chloride: 107 mmol/L (ref 101–111)
Creatinine, Ser: 0.42 mg/dL — ABNORMAL LOW (ref 0.44–1.00)
GFR calc Af Amer: >60 mL/min (ref >60)
GLUCOSE: 117 mg/dL — AB (ref 65–99)
POTASSIUM: 4 mmol/L (ref 3.5–5.1)
Sodium: 139 mmol/L (ref 135–145)

## 2015-11-13 LAB — HEPATIC FUNCTION PANEL
ALT: 30 U/L (ref 14–54)
AST: 41 U/L (ref 15–41)
Albumin: 1.8 g/dL — ABNORMAL LOW (ref 3.5–5.0)
Alkaline Phosphatase: 78 U/L (ref 38–126)
BILIRUBIN INDIRECT: 0.2 mg/dL — AB (ref 0.3–0.9)
Bilirubin, Direct: 0.2 mg/dL (ref 0.1–0.5)
TOTAL PROTEIN: 5.3 g/dL — AB (ref 6.5–8.1)
Total Bilirubin: 0.4 mg/dL (ref 0.3–1.2)

## 2015-11-13 LAB — GLUCOSE, CAPILLARY
GLUCOSE-CAPILLARY: 107 mg/dL — AB (ref 65–99)
GLUCOSE-CAPILLARY: 112 mg/dL — AB (ref 65–99)
GLUCOSE-CAPILLARY: 119 mg/dL — AB (ref 65–99)
GLUCOSE-CAPILLARY: 91 mg/dL (ref 65–99)
GLUCOSE-CAPILLARY: 95 mg/dL (ref 65–99)
Glucose-Capillary: 124 mg/dL — ABNORMAL HIGH (ref 65–99)

## 2015-11-13 LAB — TRIGLYCERIDES: TRIGLYCERIDES: 159 mg/dL — AB (ref <150)

## 2015-11-13 LAB — PHENYTOIN LEVEL, TOTAL: PHENYTOIN LVL: 14.3 ug/mL (ref 10.0–20.0)

## 2015-11-13 LAB — MAGNESIUM: Magnesium: 1.2 mg/dL — ABNORMAL LOW (ref 1.7–2.4)

## 2015-11-13 MED ORDER — MAGNESIUM SULFATE 2 GM/50ML IV SOLN
2.0000 g | Freq: Once | INTRAVENOUS | Status: AC
  Administered 2015-11-13: 2 g via INTRAVENOUS
  Filled 2015-11-13: qty 50

## 2015-11-13 MED ORDER — PHENYTOIN SODIUM 50 MG/ML IJ SOLN
100.0000 mg | Freq: Two times a day (BID) | INTRAMUSCULAR | Status: DC
  Administered 2015-11-14 – 2015-11-17 (×8): 100 mg via INTRAVENOUS
  Filled 2015-11-13 (×8): qty 2

## 2015-11-13 NOTE — Procedures (Signed)
  Continuous Video-EEG Monitoring Report    Patient:  Kathryn [name], Kathryn [name]         EEG No.ID:  [id number]         DOB:  03-09-1931  Age:  82  Room#3M10   MED REC NO:  [id number]  Gender:  Female     TECH:  Estil Daft         Physician:  Winfield Cunas         Referring Physician:  Wallie Char     Report Date:  11/13/2015    Study Duration:         11/12/2015 07:30 to 11/13/2015 07:30 CPT Code:                 [id number] Diagnosis:                 R41.82; R56.9  History: This is a 82 year old female presenting with altered mental status and recurrent seizures.  Video-EEG monitoring was performed to evaluate for seizures.  Technical Details:  Long-term video-EEG monitoring was performed using standard setting per the guidelines.  Briefly, a minimum of 21 electrodes were placed on scalp according to the International 10-20 or/and 10-10 Systems.  Supplemental electrodes were placed as needed.  Single EKG electrode was also used to detect cardiac arrhythmia.  Patient's behavior was continuously recorded on video simultaneously with EEG.  A minimum of 16 channels were used for data display.  Each epoch of study was reviewed manually daily and as needed using standard referential and bipolar montages.    EEG Description:  The background was discontinuous, consisting of periods of suppression (<10 uV) and periods of sharply contoured polymorphic delta and theta activity.  The suppression phase constituted about 60-80% of the record, consistent with burst suppression pattern.  Diffuse beta activity was also seen, which were most likely due to medication effect (e.g., propofol).  Intermittent runs of generalized periodic discharges were seen, typically in the setting of external stimulation (such as nursing care), consistent with SIRPIDs (stimulus-induced rhythmic periodic or ictal discharges).  No posterior dominant rhythm or sleep architecture was present.  No epileptiform discharges or seizures were in  evidence.  Impression:  This is an abnormal EEG due to the presence of burst suppression, likely medication-induced (e.g., propofol).  No epileptiform discharges or seizure were present.  Compared with the last study, current study overall showed no significant changes.                   Reading Physician: Winfield Cunas, MD, PhD

## 2015-11-13 NOTE — Care Management Note (Signed)
Case Management Note  Patient Details  Name: Kathryn [name] MRN: [id number] Date of Birth: 1930/09/06  Subjective/Objective: Pt admitted on 11/04/15 with seizures.  PTA, pt was residing at [facility], after a lengthy hospital stay.                      Action/Plan: Pt currently remains sedated and on ventilator.  CSW consulted to facilitate return to SNF upon medical stability.  Will follow progress.    Expected Discharge Date:                  Expected Discharge Plan:  Skilled Nursing Facility  In-House Referral:  Clinical Social Work  Discharge planning Services     Post Acute Care Choice:    Choice offered to:     DME Arranged:    DME Agency:     HH Arranged:    [place] Agency:     Status of Service:  In process, will continue to follow  Medicare Important Message Given:  Yes Date Medicare IM Given:    Medicare IM give by:    Date Additional Medicare IM Given:    Additional Medicare Important Message give by:     If discussed at [place] of Stay Meetings, dates discussed:    Additional Comments:  Reinaldo Raddle, RN, BSN  Trauma/Neuro ICU Case Manager [phone]

## 2015-11-13 NOTE — Progress Notes (Addendum)
[facility] for phenytoin Indication: seizures  Allergies  Allergen Reactions  . Augmentin [Amoxicillin-Pot Clavulanate] Swelling and Diarrhea    Throat   . Citalopram Swelling    Eyes ears and throat swelling Throat and eyes   . Codeine Anaphylaxis and Shortness Of Breath  . Fish-Derived Products Anaphylaxis  . Hyoscyamine Anaphylaxis and Swelling  . Ibuprofen Swelling    Throat and eyes   . Latex Swelling and Rash    Swelling to troat  . Lipitor [Atorvastatin] Swelling    Muscle aches throat  . Septra [Bactrim] Anaphylaxis  . Shellfish Allergy Anaphylaxis  . Miconazole Rash  . Keflet [Cephalexin] Diarrhea    Upset stomach  . Meloxicam Diarrhea and Nausea And Vomiting  . Verapamil Other (See Comments)    Tachycardia and flushing  . Vicodin [Hydrocodone-Acetaminophen] Other (See Comments)    stroke  . Zoloft [Sertraline Hcl] Swelling    Red spots all over face, swelling of tongue and legs.   . Ciprofloxacin Nausea And Vomiting    Other reaction(s): Abdominal Pain  . Depakene [Valproate Sodium] Hives    All over body   . Isoptin Sr [Verapamil Hcl Er] Cough  . Lisinopril Cough    Fatigue and cough  . Sulfa Antibiotics Rash    Rash and also throat was tight  . Telmisartan-Hctz Cough    Vital Signs: Temp: 97.9 F (36.6 C) (03/14 0700) BP: 122/60 mmHg (03/14 0700) Pulse Rate: 78 (03/14 0700) Intake/Output from previous day: 03/13 0701 - 03/14 0700 In: 1519.5 [I.V.:609.5; NG/GT:575; IV Piggyback:335] Out: 2065 [Urine:2065] Intake/Output from this shift:    Labs:  Recent Labs  11/11/15 1015 11/12/15 0625 11/13/15 0520  WBC 4.3 5.9  --   HGB 8.9* 8.3*  --   PLT 231 277  --   CREATININE 0.43* 0.41* 0.42*   Estimated Creatinine Clearance: 44.1 mL/min (by C-G formula based on Cr of 0.42). No results for input(s): VANCOTROUGH, VANCOPEAK, VANCORANDOM, GENTTROUGH, GENTPEAK, GENTRANDOM, TOBRATROUGH, TOBRAPEAK, TOBRARND,  AMIKACINPEAK, AMIKACINTROU, AMIKACIN in the last 72 hours.    Medical History: Past Medical History  Diagnosis Date  . Depression   . Hypertension   . High cholesterol   . Rectal prolapse   . Hx of adenomatous colonic polyps   . Internal hemorrhoids   . Hypothyroidism   . Fecal incontinence   . TIA (transient ischemic attack)   . History of frequent urinary tract infections     recent  . Migraine   . Fatty liver 03/20/13  . Generalized ischemic cerebrovascular disease 10/09/2015  . Stroke [facility]) Sept 1, 2010; Sept 12, 2011    Medications:  Scheduled:  . antiseptic oral rinse  7 mL Mouth Rinse 10 times per day  . chlorhexidine gluconate  15 mL Mouth Rinse BID  . enoxaparin (LOVENOX) injection  40 mg Subcutaneous QHS  . feeding supplement (PRO-STAT SUGAR FREE 64)  30 mL Oral BID  . feeding supplement (VITAL HIGH PROTEIN)  1,000 mL Per Tube Q24H  . lacosamide (VIMPAT) IV  200 mg Intravenous Q12H  . levETIRAcetam  1,500 mg Intravenous Q12H  . pantoprazole sodium  40 mg Per Tube Q24H  . phenytoin (DILANTIN) IV  100 mg Intravenous 3 times per day  . sodium chloride  500 mL Intravenous Once    Assessment: 82 yo female here with seizures on keppra, lacosamide and phenytoin. She was recently admitted for status epilepticus and discharged 3/4 on keppra.  Pharmacy has been consulted  to dose phenytoin (started 3/7).  -3/8 phenytoin level= 15.8 (albumin= 2.7, corrected= 24) collected about 2 hours post-dose -3/14 phenytoin level = 14.3 (albumin 1.8, corrected=31)  MD noted there has been no re-occurence of seizures, continuous EEG showing burst suppression.  Goal of Therapy:  Phenytoin level= 10-20  Plan:  -Decrease phenytoin to 100mg  IV q12h -F/u DPH level in 5-7 days after intiation to assess trend in concentration. True steady state not for 10-14 days  Elicia Lamp, PharmD, [facility] Clinical Pharmacist Pager [phone] 11/13/2015 8:10 AM

## 2015-11-13 NOTE — Progress Notes (Signed)
[place] Progress Note Patient Name: Kathryn [name] DOB: 06-09-1931 MRN: [id number]   Date of Service  11/13/2015  HPI/Events of Note  hypomagnesemia  eICU Interventions  repleted     Intervention Category Minor Interventions: Electrolytes abnormality - evaluation and management  Simonne Maffucci 11/13/2015, 6:39 AM

## 2015-11-13 NOTE — Progress Notes (Signed)
PULMONARY / CRITICAL CARE MEDICINE   Name: KAITY VELE MRN: [id number] DOB: Sep 13, 1930    ADMISSION DATE:  11/04/2015 CONSULTATION DATE:  11/09/15  REFERRING MD:  Neurology / Dr. Nicole Kindred   CHIEF COMPLAINT: Seizure   SUBJECTIVE:   Propofol decreased slightly 3/13 Continuous EEG without any active seizures, does have burst suppression pattern  VITAL SIGNS: BP 146/65 mmHg  Pulse 75  Temp(Src) 97.9 F (36.6 C) (Core (Comment))  Resp 16  Wt 56.9 kg (125 lb 7.1 oz)  SpO2 100%  VENTILATOR SETTINGS: Vent Mode:  [-] PRVC FiO2 (%):  [40 %] 40 % Set Rate:  [16 bmp] 16 bmp Vt Set:  [420 mL] 420 mL PEEP:  [5 cmH20] 5 cmH20 Pressure Support:  [5 cmH20] 5 cmH20 Plateau Pressure:  [12 cmH20-15 cmH20] 14 cmH20  INTAKE / OUTPUT: I/O last 3 completed shifts: In: 2472.6 [I.V.:952.6; NG/GT:875; IV Piggyback:645] Out: 2655 [Urine:2655]  PHYSICAL EXAMINATION: General: sedated Neuro: RASS -4 HEENT: ETT in place Cardiac: regular Chest: no wheeze Abd: soft, non tender Ext: no edema Skin: sores on heels  LABS:  BMET  Recent Labs Lab 11/11/15 1015 11/12/15 0625 11/13/15 0520  NA 135 137 139  K 3.0* 2.8* 4.0  CL 102 104 107  CO2 22 26 27   BUN 9 14 17   CREATININE 0.43* 0.41* 0.42*  GLUCOSE 164* 141* 117*    Electrolytes  Recent Labs Lab 11/07/15 0300 11/08/15 0243  11/11/15 1015 11/12/15 0625 11/13/15 0520  CALCIUM 8.5* 8.6*  < > 8.6* 8.4* 8.7*  MG 1.3* 2.2  --   --   --  1.2*  < > = values in this interval not displayed.  CBC  Recent Labs Lab 11/10/15 0630 11/11/15 1015 11/12/15 0625  WBC 4.8 4.3 5.9  HGB 8.7* 8.9* 8.3*  HCT 26.7* 27.6* 25.8*  PLT 351 231 277    Coag's No results for input(s): APTT, INR in the last 168 hours.  Sepsis Markers No results for input(s): LATICACIDVEN, PROCALCITON, O2SATVEN in the last 168 hours.  ABG  Recent Labs Lab 11/09/15 1750  PHART 7.470*  PCO2ART 33.9*  PO2ART 204*    Liver Enzymes  Recent Labs Lab  11/08/15 1806  AST 44*  ALT 50  ALKPHOS 78  BILITOT 0.4  ALBUMIN 2.2*    Cardiac Enzymes No results for input(s): TROPONINI, PROBNP in the last 168 hours.  Glucose  Recent Labs Lab 11/12/15 1136 11/12/15 1532 11/12/15 1946 11/12/15 2350 11/13/15 0326 11/13/15 0816  GLUCAP 129* 108* 114* 107* 95 112*    Imaging No results found.   STUDIES:  3/10 Continuous EEG >> Left hemispheric PLEDS were present with maximum negativity in the left posterotemporal/parietal cortex, although less developed, and less organized essentially unchanged compare to day 3 of recording  There were no subclinical seizures 3/13 continueous EEG >> burst suppression pattern but no seizure focus  CULTURES: HIV 3/6 >> negative  BCx2 3/6 >> negative UC 3/6 >> enterococcus >> pan sensitive   ANTIBIOTICS: Aztreonam 3/6 >> 3/6  Fortaz 3/6 >> 3/8 Vanco 3/6 >> 3/12  SIGNIFICANT EVENTS: 3/04  Discharged after episode of status epilepticus 3/05  Readmitted  LINES/TUBES: ETT 3/10 >> Lt IJ CVL 3/11 >>  DISCUSSION: [name] with PMH of CVA and recent admission for status epilepticus readmitted 3/5 for repeat seizure activity.  Despite AED's, the patient continues to have seizures on continuous EEG.  PCCM called for elective intubation for deep sedation to control seizures.  ASSESSMENT / PLAN:   NEUROLOGIC A:   Acute Encephalopathy in setting of Status Epilepticus Hx CVA, Parkinson's Disease P:   RASS goal: -4 Continuous EEG, still some burst-suppression seizures on 3/12 and 3/13 Versed gtt and propofol to control seizures Continue Vimpat, Keppra, Dilantin & Ativan  Wean as per neuro recs Foot drop boots Will need to consider and discuss goals of care with family  PULMONARY A: OSA - on CPAP Hx Difficult Airway  Compromised airway in setting of seizures P:   Full vent support until neuro status improved   CARDIOVASCULAR A:  Hx Hypertension  Shock, suspect related to medication  / sedation although note has had enterococcal UTI DNR  P:  Monitor hemodynamics  RENAL A:   Hypervolemia. P:   KVO IV fluids Repeat lasix on 3/13  GASTROINTESTINAL A:   Nutrition. P:   Tube feeds Protonix for SUP  HEMATOLOGIC A:   Anemia of critical illness and chronic disease P:  Trend CBC  Lovenox for DVT prophylaxis   INFECTIOUS A:   Enterococcus UTI >> multiple Abx allergies P:   Completed 7 days vanco 3/12 Follow off abx for now  ENDOCRINE A:   No acute issues  P:   Monitor glucose on BMP      Baltazar Apo, MD, PhD 11/13/2015, 9:52 AM [place] [phone] or if no answer [phone]

## 2015-11-13 NOTE — Progress Notes (Signed)
LTM EEG checked, several electrodes were re-preped and re-glued. No skin breakdown noted

## 2015-11-13 NOTE — Care Management Important Message (Signed)
Important Message  Patient Details  Name: Kathryn [name] MRN: [id number] Date of Birth: 1931/05/09   Medicare Important Message Given:  Yes    [name] May 11/13/2015, 11:29 AM

## 2015-11-14 LAB — BASIC METABOLIC PANEL
ANION GAP: 8 (ref 5–15)
BUN: 18 mg/dL (ref 6–20)
CHLORIDE: 103 mmol/L (ref 101–111)
CO2: 27 mmol/L (ref 22–32)
Calcium: 9.1 mg/dL (ref 8.9–10.3)
Creatinine, Ser: 0.32 mg/dL — ABNORMAL LOW (ref 0.44–1.00)
GFR calc Af Amer: >60 mL/min (ref >60)
GFR calc non Af Amer: >60 mL/min (ref >60)
GLUCOSE: 120 mg/dL — AB (ref 65–99)
POTASSIUM: 3.6 mmol/L (ref 3.5–5.1)
Sodium: 138 mmol/L (ref 135–145)

## 2015-11-14 LAB — GLUCOSE, CAPILLARY
GLUCOSE-CAPILLARY: 102 mg/dL — AB (ref 65–99)
GLUCOSE-CAPILLARY: 116 mg/dL — AB (ref 65–99)
Glucose-Capillary: 118 mg/dL — ABNORMAL HIGH (ref 65–99)
Glucose-Capillary: 120 mg/dL — ABNORMAL HIGH (ref 65–99)
Glucose-Capillary: 126 mg/dL — ABNORMAL HIGH (ref 65–99)
Glucose-Capillary: 127 mg/dL — ABNORMAL HIGH (ref 65–99)
Glucose-Capillary: 88 mg/dL (ref 65–99)

## 2015-11-14 MED ORDER — VITAL AF 1.2 CAL PO LIQD
1000.0000 mL | ORAL | Status: DC
  Administered 2015-11-14 – 2015-11-27 (×14): 1000 mL

## 2015-11-14 MED ORDER — FUROSEMIDE 10 MG/ML IJ SOLN
40.0000 mg | Freq: Once | INTRAMUSCULAR | Status: AC
  Administered 2015-11-14: 40 mg via INTRAVENOUS
  Filled 2015-11-14: qty 4

## 2015-11-14 NOTE — Procedures (Signed)
Continuous Video-EEG Monitoring Report    Patient:   Kathryn [name], Kathryn [name]             EEG No.ID:   [id number]             DOB:   1930/11/03   Age:   82   Room#3M10    MED REC NO:   [id number]   Gender:   Female       TECH:   Estil Daft             Physician:   Winfield Cunas             Referring Physician:   Wallie Char       Report Date:           11/14/2015     Study Duration:         11/13/2015 07:30 to 11/14/2015 08:48 CPT Code:                 [id number] Diagnosis:                 R41.82; R56.9  History: This is an 82 year old female presenting with altered mental status and recurrent seizures.  Video-EEG monitoring was performed to evaluate for seizures.  Technical Details:  Long-term video-EEG monitoring was performed using standard setting per the guidelines.  Briefly, a minimum of 21 electrodes were placed on scalp according to the International 10-20 or/and 10-10 Systems.  Supplemental electrodes were placed as needed.  Single EKG electrode was also used to detect cardiac arrhythmia.  Patient's behavior was continuously recorded on video simultaneously with EEG.  A minimum of 16 channels were used for data display.  Each epoch of study was reviewed manually daily and as needed using standard referential and bipolar montages.    EEG Description:  Initially, the background was discontinuous, consisting of periods of suppression (<10 uV) and periods of sharply contoured polymorphic delta and theta activity.  The suppression phase constituted about 60-80% of the record, consistent with burst suppression pattern.  Diffuse beta activity was also seen, which were most likely due to medication effect (e.g., propofol).  Intermittent runs of generalized periodic discharges were seen, typically in the setting of external stimulation (such as nursing care), consistent with SIRPIDs (stimulus-induced rhythmic periodic or ictal discharges).  Over time, the background became continuous, consisting of generalized  polymorphic delta slowing.  No posterior dominant rhythm or sleep architecture was present.  No epileptiform discharges or seizures were in evidence.  Impression:  This is an abnormal EEG due to the presence of initially burst suppression and subsequently generalized polymorphic delta slowing, suggesting severe encephalopathy, but medication effect (propofol) could not be excluded.  No epileptiform discharges or seizure were present.  Compared with the last study, current study showed more continuous background, suggesting interval improvement.                   [place] Physician: Winfield Cunas, MD, PhD

## 2015-11-14 NOTE — Progress Notes (Signed)
Interval History:                                                                                                                      Kathryn [name] is an 82 y.o. female patient readmitted with status epilepticus,  on  Versed infusion.  Long-term EEG monitoring showed improved background activity which is more continuous and resolution of the burst suppression pattern noted previously, no seizures and abnormal discharges seen.  Patient is still intubated.  No family at bedside.  Past Medical History: Past Medical History  Diagnosis Date  . Depression   . Hypertension   . High cholesterol   . Rectal prolapse   . Hx of adenomatous colonic polyps   . Internal hemorrhoids   . Hypothyroidism   . Fecal incontinence   . TIA (transient ischemic attack)   . History of frequent urinary tract infections     recent  . Migraine   . Fatty liver 03/20/13  . Generalized ischemic cerebrovascular disease 10/09/2015  . Stroke [facility]) Sept 1, 2010; Sept 12, 2011    Past Surgical History  Procedure Laterality Date  . Colon surgery  2010, for prolapsed organs after hystecrtomy surgery  . Appendectomy  2008  . Cholecystectomy    . Lumbar laminectomy/decompression microdiscectomy N/A 01/07/2013    Procedure: LUMBAR LAMINECTOMY CENTRAL DECOMPRESSION L4-L5, BILATERAL FORAMENOTOMY L4,L5    ;  Surgeon: Tobi Bastos, MD;  Location: [facility] ORS;  Service: Orthopedics;  Laterality: N/A;  . Back surgery    . Hemorrhoid surgery  09/2008    Archie Endo 12/31/2010  . Abdominal hysterectomy  05/2007    Archie Endo 01/02/2011    Family History: Family History  Problem Relation Age of Onset  . Stroke Father   . Migraines Father   . CVA Father   . Heart attack Father   . Hypertension Maternal Aunt     x3  . Colon cancer Neg Hx   . CVA Mother     Social History:   reports that she has never smoked. She has never used smokeless tobacco. She reports that she does not drink alcohol or use illicit drugs.  Allergies:  Allergies   Allergen Reactions  . Augmentin [Amoxicillin-Pot Clavulanate] Swelling and Diarrhea    Throat   . Citalopram Swelling    Eyes ears and throat swelling Throat and eyes   . Codeine Anaphylaxis and Shortness Of Breath  . Fish-Derived Products Anaphylaxis  . Hyoscyamine Anaphylaxis and Swelling  . Ibuprofen Swelling    Throat and eyes   . Latex Swelling and Rash    Swelling to troat  . Lipitor [Atorvastatin] Swelling    Muscle aches throat  . Septra [Bactrim] Anaphylaxis  . Shellfish Allergy Anaphylaxis  . Miconazole Rash  . Keflet [Cephalexin] Diarrhea    Upset stomach  . Meloxicam Diarrhea and Nausea And Vomiting  . Verapamil Other (See Comments)    Tachycardia and flushing  . Vicodin [Hydrocodone-Acetaminophen] Other (See Comments)  stroke  . Zoloft [Sertraline Hcl] Swelling    Red spots all over face, swelling of tongue and legs.   . Ciprofloxacin Nausea And Vomiting    Other reaction(s): Abdominal Pain  . Depakene [Valproate Sodium] Hives    All over body   . Isoptin Sr [Verapamil Hcl Er] Cough  . Lisinopril Cough    Fatigue and cough  . Sulfa Antibiotics Rash    Rash and also throat was tight  . Telmisartan-Hctz Cough     Medications:                                                                                                                         Current facility-administered medications:  .  0.9 %  sodium chloride infusion, , Intravenous, Continuous, Chesley Mires, MD, Last Rate: 10 mL/hr at 11/14/15 2100 .  antiseptic oral rinse solution (CORINZ), 7 mL, Mouth Rinse, 10 times per day, Javier Glazier, MD, 7 mL at 11/14/15 2126 .  bisacodyl (DULCOLAX) suppository 10 mg, 10 mg, Rectal, Daily PRN, Donita Brooks, NP, 10 mg at 11/13/15 1027 .  chlorhexidine gluconate (PERIDEX) 0.12 % solution 15 mL, 15 mL, Mouth Rinse, BID, Javier Glazier, MD, 15 mL at 11/14/15 1959 .  enoxaparin (LOVENOX) injection 40 mg, 40 mg, Subcutaneous, QHS, Wynell Balloon,  RPH, 40 mg at 11/14/15 2123 .  feeding supplement (VITAL AF 1.2 CAL) liquid 1,000 mL, 1,000 mL, Per Tube, Continuous, Asencion Islam, RD, Last Rate: 45 mL/hr at 11/14/15 2100, 1,000 mL at 11/14/15 2100 .  fentaNYL (SUBLIMAZE) injection 50 mcg, 50 mcg, Intravenous, Q2H PRN, Donita Brooks, NP .  hydrALAZINE (APRESOLINE) injection 10 mg, 10 mg, Intravenous, Q6H PRN, Nishant Dhungel, MD, 10 mg at 11/14/15 2004 .  lacosamide (VIMPAT) 200 mg in sodium chloride 0.9 % 25 mL IVPB, 200 mg, Intravenous, Q12H, Greta Doom, MD, 200 mg at 11/14/15 1300 .  levETIRAcetam (KEPPRA) 1,500 mg in sodium chloride 0.9 % 100 mL IVPB, 1,500 mg, Intravenous, Q12H, Allie Bossier, MD, Last Rate: 460 mL/hr at 11/14/15 2123, 1,500 mg at 11/14/15 2123 .  midazolam (VERSED) 100 mg in sodium chloride 0.9 % 100 mL (1 mg/mL) infusion, 10 mg/hr, Intravenous, Titrated, Kamaury Cutbirth Fuller Mandril, MD, Last Rate: 3 mL/hr at 11/14/15 2129, 3 mg/hr at 11/14/15 2129 .  pantoprazole sodium (PROTONIX) 40 mg/20 mL oral suspension 40 mg, 40 mg, Per Tube, Q24H, Chesley Mires, MD, 40 mg at 11/14/15 0747 .  phenylephrine (NEO-SYNEPHRINE) 40 mg in dextrose 5 % 250 mL (0.16 mg/mL) infusion, 0-100 mcg/min, Intravenous, Titrated, Javier Glazier, MD, Stopped at 11/13/15 1147 .  phenytoin (DILANTIN) injection 100 mg, 100 mg, Intravenous, Q12H, Romona Curls, RPH, 100 mg at 11/14/15 1428 .  pneumococcal 23 valent vaccine (PNU-IMMUNE) injection 0.5 mL, 0.5 mL, Intramuscular, Prior to discharge, Velvet Bathe, MD .  propofol (DIPRIVAN) 1000 MG/100ML infusion, 5-80 mcg/kg/min, Intravenous, Titrated, Greta Doom, MD, Stopped at 11/13/15 1020 .  sodium chloride 0.9 % bolus 500 mL, 500 mL, Intravenous, Once, Gardiner Barefoot, NP   Neurologic Examination:                                                                                                     Today's Vitals   11/14/15 1900 11/14/15 1919 11/14/15 2000 11/14/15 2040  BP:  167/87 167/87 178/80 157/61  Pulse: 96 96 101 107  Temp: 99.5 F (37.5 C) 99.5 F (37.5 C) 99.5 F (37.5 C) 99.1 F (37.3 C)  TempSrc:   Core (Comment)   Resp: 16 17 16 16   Weight:      SpO2: 100% 100% 100% 99%  PainSc:        Patient intubated, sedated with Versed infusions. Pupils reactive. No motor response to stimulation, likely sedation effect .   Lab Results: Basic Metabolic Panel:  Recent Labs Lab 11/08/15 0243  11/10/15 0630 11/11/15 1015 11/12/15 0625 11/13/15 0520 11/14/15 0500  NA 133*  < > 133* 135 137 139 138  K 3.0*  < > 3.5 3.0* 2.8* 4.0 3.6  CL 99*  < > 101 102 104 107 103  CO2 24  < > 23 22 26 27 27   GLUCOSE 152*  < > 132* 164* 141* 117* 120*  BUN 6  < > 9 9 14 17 18   CREATININE 0.38*  < > 0.38* 0.43* 0.41* 0.42* 0.32*  CALCIUM 8.6*  < > 9.0 8.6* 8.4* 8.7* 9.1  MG 2.2  --   --   --   --  1.2*  --   < > = values in this interval not displayed.  Liver Function Tests:  Recent Labs Lab 11/08/15 1806 11/13/15 1010  AST 44* 41  ALT 50 30  ALKPHOS 78 78  BILITOT 0.4 0.4  PROT 5.7* 5.3*  ALBUMIN 2.2* 1.8*   No results for input(s): LIPASE, AMYLASE in the last 168 hours.  Recent Labs Lab 11/08/15 1809  AMMONIA 34    CBC:  Recent Labs Lab 11/08/15 0243 11/09/15 0300 11/10/15 0630 11/11/15 1015 11/12/15 0625  WBC 5.9 5.5 4.8 4.3 5.9  HGB 9.7* 10.6* 8.7* 8.9* 8.3*  HCT 28.5* 32.8* 26.7* 27.6* 25.8*  MCV 91.6 92.7 92.1 92.6 92.5  PLT 270 279 351 231 277    Cardiac Enzymes: No results for input(s): CKTOTAL, CKMB, CKMBINDEX, TROPONINI in the last 168 hours.  Lipid Panel:  Recent Labs Lab 11/10/15 0132 11/13/15 0020  TRIG 133 159*    CBG:  Recent Labs Lab 11/14/15 0341 11/14/15 0758 11/14/15 1130 11/14/15 1551 11/14/15 1921  GLUCAP 88 102* 116* 126* 118*    Microbiology: Results for orders placed or performed during the hospital encounter of 11/04/15  Culture, Urine     Status: None   Collection Time: 11/05/15  12:05 AM  Result Value Ref Range Status   Specimen Description Urine  Final   Special Requests NONE  Final   Culture 70,000 COLONIES/ml ENTEROCOCCUS SPECIES  Final   Report Status 11/07/2015 FINAL  Final   Organism ID, Bacteria ENTEROCOCCUS SPECIES  Final      Susceptibility   Enterococcus species - MIC*    AMPICILLIN <=2 SENSITIVE Sensitive     VANCOMYCIN 1 SENSITIVE Sensitive     GENTAMICIN SYNERGY SENSITIVE Sensitive     * 70,000 COLONIES/ml ENTEROCOCCUS SPECIES  Culture, blood (routine x 2)     Status: None   Collection Time: 11/05/15  9:50 AM  Result Value Ref Range Status   Specimen Description BLOOD RIGHT HAND  Final   Special Requests IN PEDIATRIC BOTTLE 1CC  Final   Culture NO GROWTH 5 DAYS  Final   Report Status 11/10/2015 FINAL  Final  Culture, blood (routine x 2)     Status: None   Collection Time: 11/05/15  9:53 AM  Result Value Ref Range Status   Specimen Description BLOOD RIGHT ANTECUBITAL  Final   Special Requests BOTTLES DRAWN AEROBIC AND ANAEROBIC 5CC  Final   Culture NO GROWTH 5 DAYS  Final   Report Status 11/10/2015 FINAL  Final  MRSA PCR Screening     Status: None   Collection Time: 11/12/15  6:16 PM  Result Value Ref Range Status   MRSA by PCR NEGATIVE NEGATIVE Final    Comment:        The GeneXpert MRSA Assay (FDA approved for NASAL specimens only), is one component of a comprehensive MRSA colonization surveillance program. It is not intended to diagnose MRSA infection nor to guide or monitor treatment for MRSA infections.     Imaging: Dg Chest Port 1 View  11/12/2015  CLINICAL DATA:  Patient with history of respiratory failure. EXAM: PORTABLE CHEST 1 VIEW COMPARISON:  Chest radiograph 11/11/2015. FINDINGS: ET tube terminates in the distal trachea. Enteric tube courses inferior to the diaphragm. Stable cardiac and mediastinal contours. No consolidative pulmonary opacities. Possible small bilateral pleural effusions. Left IJ central venous catheter  tip projects over the superior vena cava. IMPRESSION: Stable support apparatus. Possible small bilateral pleural effusions. Electronically Signed   By: Lovey Newcomer M.D.   On: 11/12/2015 08:06   Dg Chest Port 1 View  11/11/2015  CLINICAL DATA:  82 year old female with history of respiratory failure. EXAM: PORTABLE CHEST 1 VIEW COMPARISON:  Chest x-ray 11/10/2015. FINDINGS: An endotracheal tube is in place with tip 3.5 cm above the carina. Lung volumes are slightly low. There are minimal bibasilar opacities most compatible with areas of mild subsegmental atelectasis. Trace bilateral pleural effusions. No consolidative airspace disease. No evidence of pulmonary edema. Heart size and mediastinal contours are within normal limits. Atherosclerosis in the thoracic aorta. IMPRESSION: 1. Support apparatus, as above. 2. Trace bilateral pleural effusions with minimal bibasilar subsegmental atelectasis. Electronically Signed   By: Vinnie Langton M.D.   On: 11/11/2015 10:09    Assessment and plan:   Kathryn [name] is an 82 y.o. female patient  readmitted with status epilepticus, now on Versed infusions Long-term EEG monitoring shows improved background activity, and resolution of the burst suppression pattern, with no seizures.  propofol infusion was turned off yesterday which has improved the EEG background.  We'll discontinue the long-term EEG monitoring.   At this time, recommend gradually weaning off from the Versed infusion by reducing the rate by 1 mg every hour, currently at 10 mg/hour.  Continue Vimpat, Keppra and Dilantin.   We'll follow-up.

## 2015-11-14 NOTE — Progress Notes (Signed)
Nutrition Follow-up  INTERVENTION:  -d/c Vital High Protein and 30 mL Prostat  -Provide Vital AF 1.2 @ 45 ml/hr Provides 1296 kcal, 81 grams of protein, and 876 ml of free water  NUTRITION DIAGNOSIS:   Inadequate oral intake related to inability to eat as evidenced by NPO status.  -Ongoing  GOAL:   Patient will meet greater than or equal to 90% of their needs  -Progressing   MONITOR:   Vent status, Labs, I & O's, TF tolerance  ASSESSMENT:   82 y/o with history of depression, TIA, CVA, Parkinson's disease, seizure d/o, HTN, OSA, hypothyroidism, Polypharmacy. Patient was brought in by EMS from [facility] due to prolonged seizure activity.  Patient is currently intubated on ventilator support MV: 8 L/min Temp: 36.9 C Propofol: d/c 3/14  CBG: 88-124 OG tube   Medications reviewed. Labs reviewed.    Diet Order:  Diet NPO time specified  Skin:  Wound (see comment) (Deep tissue injury left heel)  Last BM:  3/11  Height:   Ht Readings from Last 1 Encounters:  10/21/15 5\' 3"  (1.6 m)    Weight:   Wt Readings from Last 1 Encounters:  11/14/15 130 lb 1.1 oz (59 kg)    Ideal Body Weight:  52.3 kg  BMI:  Body mass index is 23.05 kg/(m^2).  Estimated Nutritional Needs:   Kcal:  1219 kcals  Protein:  73-84 g (1.4-1.6 g/kg bw)  Fluid:  > 1.5 L/day  EDUCATION NEEDS:   No education needs identified at this time  [place], Dietetic Intern Pager: [phone]

## 2015-11-14 NOTE — Progress Notes (Signed)
Interval History:                                                                                                                      Kathryn [name] is an 82 y.o. female patient readmitted with status epilepticus,  on propofol and Versed infusions.  Long-term EEG monitoring shows burst suppression pattern, with no seizures.  Patient is intubated.    Past Medical History: Past Medical History  Diagnosis Date  . Depression   . Hypertension   . High cholesterol   . Rectal prolapse   . Hx of adenomatous colonic polyps   . Internal hemorrhoids   . Hypothyroidism   . Fecal incontinence   . TIA (transient ischemic attack)   . History of frequent urinary tract infections     recent  . Migraine   . Fatty liver 03/20/13  . Generalized ischemic cerebrovascular disease 10/09/2015  . Stroke [facility]) Sept 1, 2010; Sept 12, 2011    Past Surgical History  Procedure Laterality Date  . Colon surgery  2010, for prolapsed organs after hystecrtomy surgery  . Appendectomy  2008  . Cholecystectomy    . Lumbar laminectomy/decompression microdiscectomy N/A 01/07/2013    Procedure: LUMBAR LAMINECTOMY CENTRAL DECOMPRESSION L4-L5, BILATERAL FORAMENOTOMY L4,L5    ;  Surgeon: Tobi Bastos, MD;  Location: [facility]S;  Service: Orthopedics;  Laterality: N/A;  . Back surgery    . Hemorrhoid surgery  09/2008    Archie Endo 12/31/2010  . Abdominal hysterectomy  05/2007    Archie Endo 01/02/2011    Family History: Family History  Problem Relation Age of Onset  . Stroke Father   . Migraines Father   . CVA Father   . Heart attack Father   . Hypertension Maternal Aunt     x3  . Colon cancer Neg Hx   . CVA Mother     Social History:   reports that she has never smoked. She has never used smokeless tobacco. She reports that she does not drink alcohol or use illicit drugs.  Allergies:  Allergies  Allergen Reactions  . Augmentin [Amoxicillin-Pot Clavulanate] Swelling and Diarrhea    Throat   . Citalopram Swelling     Eyes ears and throat swelling Throat and eyes   . Codeine Anaphylaxis and Shortness Of Breath  . Fish-Derived Products Anaphylaxis  . Hyoscyamine Anaphylaxis and Swelling  . Ibuprofen Swelling    Throat and eyes   . Latex Swelling and Rash    Swelling to troat  . Lipitor [Atorvastatin] Swelling    Muscle aches throat  . Septra [Bactrim] Anaphylaxis  . Shellfish Allergy Anaphylaxis  . Miconazole Rash  . Keflet [Cephalexin] Diarrhea    Upset stomach  . Meloxicam Diarrhea and Nausea And Vomiting  . Verapamil Other (See Comments)    Tachycardia and flushing  . Vicodin [Hydrocodone-Acetaminophen] Other (See Comments)    stroke  . Zoloft [Sertraline Hcl] Swelling    Red spots all over face, swelling of tongue and  legs.   . Ciprofloxacin Nausea And Vomiting    Other reaction(s): Abdominal Pain  . Depakene [Valproate Sodium] Hives    All over body   . Isoptin Sr [Verapamil Hcl Er] Cough  . Lisinopril Cough    Fatigue and cough  . Sulfa Antibiotics Rash    Rash and also throat was tight  . Telmisartan-Hctz Cough     Medications:                                                                                                                         Current facility-administered medications:  .  0.9 %  sodium chloride infusion, , Intravenous, Continuous, Chesley Mires, MD, Last Rate: 10 mL/hr at 11/11/15 0959, 10 mL/hr at 11/11/15 0959 .  antiseptic oral rinse solution (CORINZ), 7 mL, Mouth Rinse, 10 times per day, Javier Glazier, MD, 7 mL at 11/13/15 2355 .  bisacodyl (DULCOLAX) suppository 10 mg, 10 mg, Rectal, Daily PRN, Donita Brooks, NP, 10 mg at 11/13/15 1027 .  chlorhexidine gluconate (PERIDEX) 0.12 % solution 15 mL, 15 mL, Mouth Rinse, BID, Javier Glazier, MD, 15 mL at 11/13/15 1935 .  enoxaparin (LOVENOX) injection 40 mg, 40 mg, Subcutaneous, QHS, Wynell Balloon, RPH, 40 mg at 11/13/15 2139 .  feeding supplement (PRO-STAT SUGAR FREE 64) liquid 30 mL, 30 mL, Oral,  BID, Javier Glazier, MD, 30 mL at 11/13/15 2139 .  feeding supplement (VITAL HIGH PROTEIN) liquid 1,000 mL, 1,000 mL, Per Tube, Q24H, Chesley Mires, MD, 1,000 mL at 11/13/15 1833 .  fentaNYL (SUBLIMAZE) injection 50 mcg, 50 mcg, Intravenous, Q2H PRN, Donita Brooks, NP .  hydrALAZINE (APRESOLINE) injection 10 mg, 10 mg, Intravenous, Q6H PRN, Nishant Dhungel, MD, 10 mg at 11/07/15 2256 .  lacosamide (VIMPAT) 200 mg in sodium chloride 0.9 % 25 mL IVPB, 200 mg, Intravenous, Q12H, Greta Doom, MD, 200 mg at 11/13/15 1308 .  levETIRAcetam (KEPPRA) 1,500 mg in sodium chloride 0.9 % 100 mL IVPB, 1,500 mg, Intravenous, Q12H, Allie Bossier, MD, Last Rate: 460 mL/hr at 11/13/15 2139, 1,500 mg at 11/13/15 2139 .  midazolam (VERSED) 100 mg in sodium chloride 0.9 % 100 mL (1 mg/mL) infusion, 10 mg/hr, Intravenous, Titrated, Omair Dettmer Fuller Mandril, MD, Last Rate: 10 mL/hr at 11/13/15 2200, 10 mg/hr at 11/13/15 2200 .  pantoprazole sodium (PROTONIX) 40 mg/20 mL oral suspension 40 mg, 40 mg, Per Tube, Q24H, Chesley Mires, MD, 40 mg at 11/13/15 0743 .  phenylephrine (NEO-SYNEPHRINE) 40 mg in dextrose 5 % 250 mL (0.16 mg/mL) infusion, 0-100 mcg/min, Intravenous, Titrated, Javier Glazier, MD, Stopped at 11/13/15 1147 .  phenytoin (DILANTIN) injection 100 mg, 100 mg, Intravenous, Q12H, Romona Curls, RPH .  pneumococcal 23 valent vaccine (PNU-IMMUNE) injection 0.5 mL, 0.5 mL, Intramuscular, Prior to discharge, Velvet Bathe, MD .  propofol (DIPRIVAN) 1000 MG/100ML infusion, 5-80 mcg/kg/min, Intravenous, Titrated, Greta Doom, MD, Stopped at 11/13/15 1020 .  sodium chloride 0.9 %  bolus 500 mL, 500 mL, Intravenous, Once, Gardiner Barefoot, NP   Neurologic Examination:                                                                                                     Today's Vitals   11/13/15 2030 11/13/15 2100 11/13/15 2200 11/13/15 2323  BP: 138/71 143/63 154/70 146/77  Pulse: 95 92 93  94  Temp: 99.1 F (37.3 C) 99 F (37.2 C) 98.8 F (37.1 C) 98.8 F (37.1 C)  TempSrc:      Resp: 16 16 17 18   Weight:      SpO2: 100% 100% 100% 100%  PainSc:        Patient intubated, sedated with propofol and Versed infusions. Pupils reactive. No motor response to stimulation, likely sedation effect .   Lab Results: Basic Metabolic Panel:  Recent Labs Lab 11/07/15 0300 11/08/15 0243  11/09/15 0300 11/10/15 0630 11/11/15 1015 11/12/15 0625 11/13/15 0520  NA 134* 133*  < > 134* 133* 135 137 139  K 3.2* 3.0*  < > 4.1 3.5 3.0* 2.8* 4.0  CL 102 99*  < > 100* 101 102 104 107  CO2 23 24  < > 24 23 22 26 27   GLUCOSE 159* 152*  < > 95 132* 164* 141* 117*  BUN 8 6  < > <5* 9 9 14 17   CREATININE 0.42* 0.38*  < > 0.33* 0.38* 0.43* 0.41* 0.42*  CALCIUM 8.5* 8.6*  < > 8.9 9.0 8.6* 8.4* 8.7*  MG 1.3* 2.2  --   --   --   --   --  1.2*  < > = values in this interval not displayed.  Liver Function Tests:  Recent Labs Lab 11/08/15 1806 11/13/15 1010  AST 44* 41  ALT 50 30  ALKPHOS 78 78  BILITOT 0.4 0.4  PROT 5.7* 5.3*  ALBUMIN 2.2* 1.8*   No results for input(s): LIPASE, AMYLASE in the last 168 hours.  Recent Labs Lab 11/08/15 1809  AMMONIA 34    CBC:  Recent Labs Lab 11/08/15 0243 11/09/15 0300 11/10/15 0630 11/11/15 1015 11/12/15 0625  WBC 5.9 5.5 4.8 4.3 5.9  HGB 9.7* 10.6* 8.7* 8.9* 8.3*  HCT 28.5* 32.8* 26.7* 27.6* 25.8*  MCV 91.6 92.7 92.1 92.6 92.5  PLT 270 279 351 231 277    Cardiac Enzymes: No results for input(s): CKTOTAL, CKMB, CKMBINDEX, TROPONINI in the last 168 hours.  Lipid Panel:  Recent Labs Lab 11/10/15 0132 11/13/15 0020  TRIG 133 159*    CBG:  Recent Labs Lab 11/13/15 0816 11/13/15 1120 11/13/15 1531 11/13/15 1939 11/13/15 2353  GLUCAP 112* 91 124* 119* 120*    Microbiology: Results for orders placed or performed during the hospital encounter of 11/04/15  Culture, Urine     Status: None   Collection Time:  11/05/15 12:05 AM  Result Value Ref Range Status   Specimen Description Urine  Final   Special Requests NONE  Final   Culture 70,000 COLONIES/ml ENTEROCOCCUS SPECIES  Final   Report Status 11/07/2015 FINAL  Final   Organism ID, Bacteria ENTEROCOCCUS SPECIES  Final      Susceptibility   Enterococcus species - MIC*    AMPICILLIN <=2 SENSITIVE Sensitive     VANCOMYCIN 1 SENSITIVE Sensitive     GENTAMICIN SYNERGY SENSITIVE Sensitive     * 70,000 COLONIES/ml ENTEROCOCCUS SPECIES  Culture, blood (routine x 2)     Status: None   Collection Time: 11/05/15  9:50 AM  Result Value Ref Range Status   Specimen Description BLOOD RIGHT HAND  Final   Special Requests IN PEDIATRIC BOTTLE 1CC  Final   Culture NO GROWTH 5 DAYS  Final   Report Status 11/10/2015 FINAL  Final  Culture, blood (routine x 2)     Status: None   Collection Time: 11/05/15  9:53 AM  Result Value Ref Range Status   Specimen Description BLOOD RIGHT ANTECUBITAL  Final   Special Requests BOTTLES DRAWN AEROBIC AND ANAEROBIC 5CC  Final   Culture NO GROWTH 5 DAYS  Final   Report Status 11/10/2015 FINAL  Final  MRSA PCR Screening     Status: None   Collection Time: 11/12/15  6:16 PM  Result Value Ref Range Status   MRSA by PCR NEGATIVE NEGATIVE Final    Comment:        The GeneXpert MRSA Assay (FDA approved for NASAL specimens only), is one component of a comprehensive MRSA colonization surveillance program. It is not intended to diagnose MRSA infection nor to guide or monitor treatment for MRSA infections.     Imaging: Dg Chest Port 1 View  11/12/2015  CLINICAL DATA:  Patient with history of respiratory failure. EXAM: PORTABLE CHEST 1 VIEW COMPARISON:  Chest radiograph 11/11/2015. FINDINGS: ET tube terminates in the distal trachea. Enteric tube courses inferior to the diaphragm. Stable cardiac and mediastinal contours. No consolidative pulmonary opacities. Possible small bilateral pleural effusions. Left IJ central venous  catheter tip projects over the superior vena cava. IMPRESSION: Stable support apparatus. Possible small bilateral pleural effusions. Electronically Signed   By: Lovey Newcomer M.D.   On: 11/12/2015 08:06   Dg Chest Port 1 View  11/11/2015  CLINICAL DATA:  82 year old female with history of respiratory failure. EXAM: PORTABLE CHEST 1 VIEW COMPARISON:  Chest x-ray 11/10/2015. FINDINGS: An endotracheal tube is in place with tip 3.5 cm above the carina. Lung volumes are slightly low. There are minimal bibasilar opacities most compatible with areas of mild subsegmental atelectasis. Trace bilateral pleural effusions. No consolidative airspace disease. No evidence of pulmonary edema. Heart size and mediastinal contours are within normal limits. Atherosclerosis in the thoracic aorta. IMPRESSION: 1. Support apparatus, as above. 2. Trace bilateral pleural effusions with minimal bibasilar subsegmental atelectasis. Electronically Signed   By: Vinnie Langton M.D.   On: 11/11/2015 10:09   Dg Chest Port 1 View  11/10/2015  CLINICAL DATA:  Seizure. EXAM: PORTABLE CHEST 1 VIEW COMPARISON:  Yesterday at 1709 hour FINDINGS: Endotracheal tube is 2.9 cm from the carina. There is a new left central line, tip in the mid SVC. No pneumothorax. Patient is rotated to the right. The cardiomediastinal contours are unchanged. Developing hazy opacity at the lung bases, question pleural effusions. Probable bibasilar atelectasis. IMPRESSION: 1. Tip of the left central line in the SVC. No pneumothorax. Endotracheal tube remains in place. 2. Question developing pleural effusions. Electronically Signed   By: Jeb Levering M.D.   On: 11/10/2015 03:52   Dg Abd Portable 1v  11/10/2015  CLINICAL DATA:  82 year old female with  history of orogastric tube placement. EXAM: PORTABLE ABDOMEN - 1 VIEW COMPARISON:  10/30/2015. FINDINGS: Previously noted feeding tube has been removed. Orogastric tube in position, although the stomach, with tip in the  proximal antrum. Gas and stool are seen scattered throughout the colon extending to the level of the distal rectum. No pathologic distension of small bowel is noted. No gross evidence of pneumoperitoneum. Surgical clips project over the right upper quadrant of the abdomen, compatible with prior cholecystectomy. Atherosclerotic calcifications throughout the visualized vasculature. IMPRESSION: 1. Tip of orogastric tube is in the proximal antrum of the stomach. 2. Atherosclerosis. Electronically Signed   By: Vinnie Langton M.D.   On: 11/10/2015 11:08    Assessment and plan:   Kathryn [name] is an 82 y.o. female patient  readmitted with status epilepticus, now on propofol and Versed infusions. Long-term EEG monitoring continues to show burst suppression pattern, with no seizures. During my evaluation, I advised to turn off propofol infusion completely. We'll monitor EEG for any recurrence of abnormal discharges or seizures. No family at bedside.  On Versed infusion at 10 mg an hour , Vimpat, Keppra and Dilantin. Continue the same.  We'll follow-up.

## 2015-11-14 NOTE — Progress Notes (Signed)
PULMONARY / CRITICAL CARE MEDICINE   Name: Kathryn [name] MRN: [id number] DOB: Mar 02, 1931    ADMISSION DATE:  11/04/2015 CONSULTATION DATE:  11/09/15  REFERRING MD:  Neurology / Dr. Nicole Kindred   CHIEF COMPLAINT: Seizure   SUBJECTIVE:   No clinical change EEG still without recurrence of seizures this am  VITAL SIGNS: BP 160/67 mmHg  Pulse 88  Temp(Src) 98.8 F (37.1 C) (Core (Comment))  Resp 16  Wt 59 kg (130 lb 1.1 oz)  SpO2 100%  VENTILATOR SETTINGS: Vent Mode:  [-] PRVC FiO2 (%):  [40 %] 40 % Set Rate:  [16 bmp] 16 bmp Vt Set:  [420 mL] 420 mL PEEP:  [5 cmH20] 5 cmH20 Pressure Support:  [5 cmH20] 5 cmH20 Plateau Pressure:  [13 cmH20-15 cmH20] 14 cmH20  INTAKE / OUTPUT: I/O last 3 completed shifts: In: 2201.4 [I.V.:701.4; NG/GT:1005; IV Piggyback:495] Out: [id number] [Urine:1575]  PHYSICAL EXAMINATION: General: sedated Neuro: RASS -4 HEENT: ETT in place Cardiac: regular Chest: no wheeze Abd: soft, non tender Ext: no edema Skin: sores on heels  LABS:  BMET  Recent Labs Lab 11/12/15 0625 11/13/15 0520 11/14/15 0500  NA 137 139 138  K 2.8* 4.0 3.6  CL 104 107 103  CO2 26 27 27   BUN 14 17 18   CREATININE 0.41* 0.42* 0.32*  GLUCOSE 141* 117* 120*    Electrolytes  Recent Labs Lab 11/08/15 0243  11/12/15 0625 11/13/15 0520 11/14/15 0500  CALCIUM 8.6*  < > 8.4* 8.7* 9.1  MG 2.2  --   --  1.2*  --   < > = values in this interval not displayed.  CBC  Recent Labs Lab 11/10/15 0630 11/11/15 1015 11/12/15 0625  WBC 4.8 4.3 5.9  HGB 8.7* 8.9* 8.3*  HCT 26.7* 27.6* 25.8*  PLT 351 231 277    Coag's No results for input(s): APTT, INR in the last 168 hours.  Sepsis Markers No results for input(s): LATICACIDVEN, PROCALCITON, O2SATVEN in the last 168 hours.  ABG  Recent Labs Lab 11/09/15 1750  PHART 7.470*  PCO2ART 33.9*  PO2ART 204*    Liver Enzymes  Recent Labs Lab 11/08/15 1806 11/13/15 1010  AST 44* 41  ALT 50 30  ALKPHOS 78 78   BILITOT 0.4 0.4  ALBUMIN 2.2* 1.8*    Cardiac Enzymes No results for input(s): TROPONINI, PROBNP in the last 168 hours.  Glucose  Recent Labs Lab 11/13/15 1531 11/13/15 1939 11/13/15 2353 11/14/15 0341 11/14/15 0758 11/14/15 1130  GLUCAP 124* 119* 120* 88 102* 116*    Imaging No results found.   STUDIES:  3/10 Continuous EEG >> Left hemispheric PLEDS were present with maximum negativity in the left posterotemporal/parietal cortex, although less developed, and less organized essentially unchanged compare to day 3 of recording  There were no subclinical seizures 3/13 continueous EEG >> burst suppression pattern but no seizure focus  CULTURES: HIV 3/6 >> negative  BCx2 3/6 >> negative UC 3/6 >> enterococcus >> pan sensitive   ANTIBIOTICS: Aztreonam 3/6 >> 3/6  Fortaz 3/6 >> 3/8 Vanco 3/6 >> 3/12  SIGNIFICANT EVENTS: 3/04  Discharged after episode of status epilepticus 3/05  Readmitted  LINES/TUBES: ETT 3/10 >> Lt IJ CVL 3/11 >>  DISCUSSION: [name] with PMH of CVA and recent admission for status epilepticus readmitted 3/5 for repeat seizure activity.  Despite AED's, the patient continues to have seizures on continuous EEG.  PCCM called for elective intubation for deep sedation to control seizures.   ASSESSMENT /  PLAN:   NEUROLOGIC A:   Acute Encephalopathy in setting of Status Epilepticus Hx CVA, Parkinson's Disease P:   RASS goal: -4 Continuous EEG, still some burst-suppression 3/12 - 3/15, no epileptiform activity Versed gtt Propofol now off Continue Vimpat, Keppra, Dilantin & Ativan  Wean as per neuro recs Foot drop boots Will need to consider and discuss goals of care with family  PULMONARY A: OSA - on CPAP Hx Difficult Airway  Compromised airway in setting of seizures P:   Full vent support until neuro status improved   CARDIOVASCULAR A:  Hx Hypertension  Shock, suspect related to medication / sedation although note has had  enterococcal UTI DNR  P:  Monitor hemodynamics  RENAL A:   Hypervolemia. P:   KVO IV fluids Repeat lasix on 3/15  GASTROINTESTINAL A:   Nutrition. P:   Tube feeds Protonix for SUP  HEMATOLOGIC A:   Anemia of critical illness and chronic disease P:  Trend CBC  Lovenox for DVT prophylaxis   INFECTIOUS A:   Enterococcus UTI >> multiple Abx allergies P:   Completed 7 days vanco 3/12 Follow off abx for now  ENDOCRINE A:   No acute issues  P:   Monitor glucose on BMP      Baltazar Apo, MD, PhD 11/14/2015, 2:36 PM [facility] [phone] or if no answer [phone]

## 2015-11-14 NOTE — Progress Notes (Signed)
LTM EEG D/C'd per Dr Silverio Decamp. No skin breakdown seen

## 2015-11-15 LAB — BASIC METABOLIC PANEL
ANION GAP: 11 (ref 5–15)
BUN: 20 mg/dL (ref 6–20)
CO2: 31 mmol/L (ref 22–32)
Calcium: 9.6 mg/dL (ref 8.9–10.3)
Chloride: 100 mmol/L — ABNORMAL LOW (ref 101–111)
Creatinine, Ser: 0.49 mg/dL (ref 0.44–1.00)
Glucose, Bld: 139 mg/dL — ABNORMAL HIGH (ref 65–99)
POTASSIUM: 2.8 mmol/L — AB (ref 3.5–5.1)
SODIUM: 142 mmol/L (ref 135–145)

## 2015-11-15 LAB — GLUCOSE, CAPILLARY
GLUCOSE-CAPILLARY: 120 mg/dL — AB (ref 65–99)
Glucose-Capillary: 118 mg/dL — ABNORMAL HIGH (ref 65–99)
Glucose-Capillary: 128 mg/dL — ABNORMAL HIGH (ref 65–99)
Glucose-Capillary: 132 mg/dL — ABNORMAL HIGH (ref 65–99)
Glucose-Capillary: 133 mg/dL — ABNORMAL HIGH (ref 65–99)
Glucose-Capillary: 136 mg/dL — ABNORMAL HIGH (ref 65–99)

## 2015-11-15 MED ORDER — POTASSIUM CHLORIDE 20 MEQ PO PACK
40.0000 meq | PACK | ORAL | Status: AC
  Administered 2015-11-15 (×3): 40 meq via ORAL
  Filled 2015-11-15 (×3): qty 2

## 2015-11-15 MED ORDER — POTASSIUM CHLORIDE 20 MEQ PO PACK: 40.0000 meq | PACK | ORAL | Status: DC

## 2015-11-15 MED ORDER — FUROSEMIDE 10 MG/ML IJ SOLN
40.0000 mg | Freq: Once | INTRAMUSCULAR | Status: AC
  Administered 2015-11-15: 40 mg via INTRAVENOUS
  Filled 2015-11-15: qty 4

## 2015-11-15 NOTE — Progress Notes (Signed)
PULMONARY / CRITICAL CARE MEDICINE   Name: Kathryn [name] MRN: [id number] DOB: [birth year]    ADMISSION DATE:  11/04/2015 CONSULTATION DATE:  11/09/15  REFERRING MD:  Neurology / Dr. Nicole Kindred   CHIEF COMPLAINT: Seizure   SUBJECTIVE:   Propofol and versed are now off Does breathe spontaneously but intermittently apneic with residual sedation on board  VITAL SIGNS: BP 177/76 mmHg  Pulse 106  Temp(Src) 99.5 F (37.5 C) (Core (Comment))  Resp 18  Wt 53.7 kg (118 lb 6.2 oz)  SpO2 100%  VENTILATOR SETTINGS: Vent Mode:  [-] PRVC FiO2 (%):  [40 %] 40 % Set Rate:  [16 bmp] 16 bmp Vt Set:  [420 mL] 420 mL PEEP:  [5 cmH20] 5 cmH20 Plateau Pressure:  [14 cmH20-18 cmH20] 16 cmH20  INTAKE / OUTPUT: I/O last 3 completed shifts: In: 2410.6 [I.V.:574.9; NG/GT:1515.7; IV Piggyback:320] Out: 3680 [Urine:3680]  PHYSICAL EXAMINATION: General: sedated Neuro: RASS -3 HEENT: ETT in place Cardiac: regular Chest: no wheeze Abd: soft, non tender Ext: no edema Skin: sores on heels  LABS:  BMET  Recent Labs Lab 11/13/15 0520 11/14/15 0500 11/15/15 0910  NA 139 138 142  K 4.0 3.6 2.8*  CL 107 103 100*  CO2 27 27 31   BUN 17 18 20   CREATININE 0.42* 0.32* 0.49  GLUCOSE 117* 120* 139*    Electrolytes  Recent Labs Lab 11/13/15 0520 11/14/15 0500 11/15/15 0910  CALCIUM 8.7* 9.1 9.6  MG 1.2*  --   --     CBC  Recent Labs Lab 11/10/15 0630 11/11/15 1015 11/12/15 0625  WBC 4.8 4.3 5.9  HGB 8.7* 8.9* 8.3*  HCT 26.7* 27.6* 25.8*  PLT 351 231 277    Coag's No results for input(s): APTT, INR in the last 168 hours.  Sepsis Markers No results for input(s): LATICACIDVEN, PROCALCITON, O2SATVEN in the last 168 hours.  ABG  Recent Labs Lab 11/09/15 1750  PHART 7.470*  PCO2ART 33.9*  PO2ART 204*    Liver Enzymes  Recent Labs Lab 11/08/15 1806 11/13/15 1010  AST 44* 41  ALT 50 30  ALKPHOS 78 78  BILITOT 0.4 0.4  ALBUMIN 2.2* 1.8*    Cardiac Enzymes No  results for input(s): TROPONINI, PROBNP in the last 168 hours.  Glucose  Recent Labs Lab 11/14/15 1130 11/14/15 1551 11/14/15 1921 11/14/15 2305 11/15/15 0355 11/15/15 0728  GLUCAP 116* 126* 118* 127* 133* 132*    Imaging No results found.   STUDIES:  3/10 Continuous EEG >> Left hemispheric PLEDS were present with maximum negativity in the left posterotemporal/parietal cortex, although less developed, and less organized essentially unchanged compare to day 3 of recording  There were no subclinical seizures 3/13 continueous EEG >> burst suppression pattern but no seizure focus  CULTURES: HIV 3/6 >> negative  BCx2 3/6 >> negative UC 3/6 >> enterococcus >> pan sensitive   ANTIBIOTICS: Aztreonam 3/6 >> 3/6  Fortaz 3/6 >> 3/8 Vanco 3/6 >> 3/12  SIGNIFICANT EVENTS: 3/04  Discharged after episode of status epilepticus 3/05  Readmitted  LINES/TUBES: ETT 3/10 >> Lt IJ CVL 3/11 >>  DISCUSSION: [name] with PMH of CVA and recent admission for status epilepticus readmitted 3/5 for repeat seizure activity.  Despite AED's, the patient continues to have seizures on continuous EEG.  PCCM called for elective intubation for deep sedation to control seizures.   ASSESSMENT / PLAN:   NEUROLOGIC A:   Acute Encephalopathy in setting of Status Epilepticus, seizures apparently resolved Hx CVA,  Parkinson's Disease P:   RASS goal: -1 to 0 Continuous EEG discontinued on 3/15 p.m. Versed gtt off 3/15 Propofol off 3/14 Continue Vimpat, Keppra, Dilantin Fentanyl when necessary while intubated Foot drop boots Continue to discuss goals of care with family, particularly with regard to whether she would be reintubated if we reach the point of extubation once sedation lifted  PULMONARY A: OSA - on CPAP Hx Difficult Airway  Compromised airway in setting of seizures P:   Continue trials of PSV, mental status still limiting but improving off sedating meds  CARDIOVASCULAR A:  Hx  Hypertension  Shock, resolved, suspect related to medication / sedation although note has had enterococcal UTI DNR  P:  Monitor hemodynamics  RENAL A:   Hypervolemia. Hypokalemia P:   KVO IV fluids Repeat lasix on 3/16 Replace electrolytes as indicated  GASTROINTESTINAL A:   Nutrition. P:   Tube feeds Protonix for SUP  HEMATOLOGIC A:   Anemia of critical illness and chronic disease P:  Trend CBC  Lovenox for DVT prophylaxis   INFECTIOUS A:   Enterococcus UTI >> multiple Abx allergies P:   Completed 7 days vanco 3/12 Follow off abx for now  ENDOCRINE A:   No acute issues  P:   Monitor glucose on BMP   Independent CC time 32 minutes   Baltazar Apo, MD, PhD 11/15/2015, 10:34 AM [facility] [phone] or if no answer [phone]

## 2015-11-15 NOTE — Progress Notes (Signed)
Wasted 25 ml Versed (1mg /ml) in sink with Rolley Sims, RN as a witness. Jimi Schappert C 3:45 PM

## 2015-11-16 ENCOUNTER — Inpatient Hospital Stay (HOSPITAL_COMMUNITY): Payer: Medicare Other

## 2015-11-16 DIAGNOSIS — I6320 Cerebral infarction due to unspecified occlusion or stenosis of unspecified precerebral arteries: Secondary | ICD-10-CM

## 2015-11-16 LAB — BASIC METABOLIC PANEL
ANION GAP: 8 (ref 5–15)
BUN: 21 mg/dL — ABNORMAL HIGH (ref 6–20)
CALCIUM: 9.9 mg/dL (ref 8.9–10.3)
CO2: 30 mmol/L (ref 22–32)
CREATININE: 0.41 mg/dL — AB (ref 0.44–1.00)
Chloride: 103 mmol/L (ref 101–111)
GFR calc Af Amer: >60 mL/min (ref >60)
Glucose, Bld: 156 mg/dL — ABNORMAL HIGH (ref 65–99)
POTASSIUM: 4.2 mmol/L (ref 3.5–5.1)
Sodium: 141 mmol/L (ref 135–145)

## 2015-11-16 LAB — GLUCOSE, CAPILLARY
GLUCOSE-CAPILLARY: 146 mg/dL — AB (ref 65–99)
GLUCOSE-CAPILLARY: 136 mg/dL — AB (ref 65–99)
GLUCOSE-CAPILLARY: 138 mg/dL — AB (ref 65–99)
GLUCOSE-CAPILLARY: 115 mg/dL — AB (ref 65–99)
Glucose-Capillary: 131 mg/dL — ABNORMAL HIGH (ref 65–99)

## 2015-11-16 LAB — MAGNESIUM: MAGNESIUM: 1.5 mg/dL — AB (ref 1.7–2.4)

## 2015-11-16 MED ORDER — POTASSIUM CHLORIDE 20 MEQ PO PACK
40.0000 meq | PACK | Freq: Once | ORAL | Status: AC
  Administered 2015-11-16: 40 meq via ORAL
  Filled 2015-11-16: qty 2

## 2015-11-16 MED ORDER — SODIUM CHLORIDE 0.9 % IV SOLN
150.0000 mg | Freq: Two times a day (BID) | INTRAVENOUS | Status: DC
  Administered 2015-11-17 (×2): 150 mg via INTRAVENOUS
  Filled 2015-11-16 (×3): qty 15

## 2015-11-16 MED ORDER — FUROSEMIDE 10 MG/ML IJ SOLN
40.0000 mg | Freq: Once | INTRAMUSCULAR | Status: AC
  Administered 2015-11-16: 40 mg via INTRAVENOUS
  Filled 2015-11-16: qty 4

## 2015-11-16 NOTE — Progress Notes (Signed)
[place] for phenytoin Indication: seizures  Allergies  Allergen Reactions  . Augmentin [Amoxicillin-Pot Clavulanate] Swelling and Diarrhea    Throat   . Citalopram Swelling    Eyes ears and throat swelling Throat and eyes   . Codeine Anaphylaxis and Shortness Of Breath  . Fish-Derived Products Anaphylaxis  . Hyoscyamine Anaphylaxis and Swelling  . Ibuprofen Swelling    Throat and eyes   . Latex Swelling and Rash    Swelling to troat  . Lipitor [Atorvastatin] Swelling    Muscle aches throat  . Septra [Bactrim] Anaphylaxis  . Shellfish Allergy Anaphylaxis  . Miconazole Rash  . Keflet [Cephalexin] Diarrhea    Upset stomach  . Meloxicam Diarrhea and Nausea And Vomiting  . Verapamil Other (See Comments)    Tachycardia and flushing  . Vicodin [Hydrocodone-Acetaminophen] Other (See Comments)    stroke  . Zoloft [Sertraline Hcl] Swelling    Red spots all over face, swelling of tongue and legs.   . Ciprofloxacin Nausea And Vomiting    Other reaction(s): Abdominal Pain  . Depakene [Valproate Sodium] Hives    All over body   . Isoptin Sr [Verapamil Hcl Er] Cough  . Lisinopril Cough    Fatigue and cough  . Sulfa Antibiotics Rash    Rash and also throat was tight  . Telmisartan-Hctz Cough    Vital Signs: Temp: 100.2 F (37.9 C) (03/17 0700) Temp Source: Core (Comment) (03/17 0600) BP: 160/70 mmHg (03/17 0700) Pulse Rate: 111 (03/17 0700) Intake/Output from previous day: 03/16 0701 - 03/17 0700 In: 2030 [I.V.:240; NG/GT:1310; IV Piggyback:480] Out: 2690 [Urine:2690] Intake/Output from this shift:    Labs:  Recent Labs  11/14/15 0500 11/15/15 0910 11/16/15 0540  CREATININE 0.32* 0.49 0.41*   Estimated Creatinine Clearance: 44.1 mL/min (by C-G formula based on Cr of 0.41). No results for input(s): VANCOTROUGH, VANCOPEAK, VANCORANDOM, GENTTROUGH, GENTPEAK, GENTRANDOM, TOBRATROUGH, TOBRAPEAK, TOBRARND, AMIKACINPEAK, AMIKACINTROU,  AMIKACIN in the last 72 hours.    Medical History: Past Medical History  Diagnosis Date  . Depression   . Hypertension   . High cholesterol   . Rectal prolapse   . Hx of adenomatous colonic polyps   . Internal hemorrhoids   . Hypothyroidism   . Fecal incontinence   . TIA (transient ischemic attack)   . History of frequent urinary tract infections     recent  . Migraine   . Fatty liver 03/20/13  . Generalized ischemic cerebrovascular disease 10/09/2015  . Stroke [facility]) Sept 1, 2010; Sept 12, 2011    Medications:  Scheduled:  . antiseptic oral rinse  7 mL Mouth Rinse 10 times per day  . chlorhexidine gluconate  15 mL Mouth Rinse BID  . enoxaparin (LOVENOX) injection  40 mg Subcutaneous QHS  . lacosamide (VIMPAT) IV  200 mg Intravenous Q12H  . levETIRAcetam  1,500 mg Intravenous Q12H  . pantoprazole sodium  40 mg Per Tube Q24H  . phenytoin (DILANTIN) IV  100 mg Intravenous Q12H  . sodium chloride  500 mL Intravenous Once    Assessment: 82 yo female here with seizures on keppra, lacosamide and phenytoin. She was recently admitted for status epilepticus and discharged 3/4 on keppra.  Pharmacy has been consulted to dose phenytoin (started 3/7).  -3/8 phenytoin level= 15.8 (albumin= 2.7, corrected= 24) collected about 2 hours post-dose -3/14 phenytoin level = 14.3 (albumin 1.8, corrected=31)  MD noted there has been no re-occurence of seizures, continuous EEG showing burst suppression.  Goal  of Therapy:  Phenytoin level= 10-20  Plan:  -Decrease phenytoin to 100mg  IV q12h -Consider re-check phenytoin level in 5-7 days (3/20-3/21) to assess trend in concentration. True steady state not for 10-14 days  Elicia Lamp, PharmD, [facility] Clinical Pharmacist Pager [phone] 11/16/2015 7:42 AM

## 2015-11-16 NOTE — Progress Notes (Signed)
Interval History:                                                                                                                      Kathryn [name] is an 82 y.o. female patient with  with status epilepticus, continues to be very drowsy and unable to wean from vent. Still intubated. Off sedation.  No family at bedside.   Past Medical History: Past Medical History  Diagnosis Date  . Depression   . Hypertension   . High cholesterol   . Rectal prolapse   . Hx of adenomatous colonic polyps   . Internal hemorrhoids   . Hypothyroidism   . Fecal incontinence   . TIA (transient ischemic attack)   . History of frequent urinary tract infections     recent  . Migraine   . Fatty liver 03/20/13  . Generalized ischemic cerebrovascular disease 10/09/2015  . Stroke [facility]) Sept 1, 2010; Sept 12, 2011    Past Surgical History  Procedure Laterality Date  . Colon surgery  2010, for prolapsed organs after hystecrtomy surgery  . Appendectomy  2008  . Cholecystectomy    . Lumbar laminectomy/decompression microdiscectomy N/A 01/07/2013    Procedure: LUMBAR LAMINECTOMY CENTRAL DECOMPRESSION L4-L5, BILATERAL FORAMENOTOMY L4,L5    ;  Surgeon: Tobi Bastos, MD;  Location: [facility] ORS;  Service: Orthopedics;  Laterality: N/A;  . Back surgery    . Hemorrhoid surgery  09/2008    Archie Endo 12/31/2010  . Abdominal hysterectomy  05/2007    Archie Endo 01/02/2011    Family History: Family History  Problem Relation Age of Onset  . Stroke Father   . Migraines Father   . CVA Father   . Heart attack Father   . Hypertension Maternal Aunt     x3  . Colon cancer Neg Hx   . CVA Mother     Social History:   reports that she has never smoked. She has never used smokeless tobacco. She reports that she does not drink alcohol or use illicit drugs.  Allergies:  Allergies  Allergen Reactions  . Augmentin [Amoxicillin-Pot Clavulanate] Swelling and Diarrhea    Throat   . Citalopram Swelling    Eyes ears and throat  swelling Throat and eyes   . Codeine Anaphylaxis and Shortness Of Breath  . Fish-Derived Products Anaphylaxis  . Hyoscyamine Anaphylaxis and Swelling  . Ibuprofen Swelling    Throat and eyes   . Latex Swelling and Rash    Swelling to troat  . Lipitor [Atorvastatin] Swelling    Muscle aches throat  . Septra [Bactrim] Anaphylaxis  . Shellfish Allergy Anaphylaxis  . Miconazole Rash  . Keflet [Cephalexin] Diarrhea    Upset stomach  . Meloxicam Diarrhea and Nausea And Vomiting  . Verapamil Other (See Comments)    Tachycardia and flushing  . Vicodin [Hydrocodone-Acetaminophen] Other (See Comments)    stroke  . Zoloft [Sertraline Hcl] Swelling    Red spots all over face, swelling of tongue and legs.   Marland Kitchen  Ciprofloxacin Nausea And Vomiting    Other reaction(s): Abdominal Pain  . Depakene [Valproate Sodium] Hives    All over body   . Isoptin Sr [Verapamil Hcl Er] Cough  . Lisinopril Cough    Fatigue and cough  . Sulfa Antibiotics Rash    Rash and also throat was tight  . Telmisartan-Hctz Cough     Medications:                                                                                                                         Current facility-administered medications:  .  0.9 %  sodium chloride infusion, , Intravenous, Continuous, Chesley Mires, MD, Last Rate: 10 mL/hr at 11/16/15 0700 .  antiseptic oral rinse solution (CORINZ), 7 mL, Mouth Rinse, 10 times per day, Javier Glazier, MD, 7 mL at 11/16/15 1720 .  bisacodyl (DULCOLAX) suppository 10 mg, 10 mg, Rectal, Daily PRN, Donita Brooks, NP, 10 mg at 11/13/15 1027 .  chlorhexidine gluconate (PERIDEX) 0.12 % solution 15 mL, 15 mL, Mouth Rinse, BID, Javier Glazier, MD, 15 mL at 11/16/15 0743 .  enoxaparin (LOVENOX) injection 40 mg, 40 mg, Subcutaneous, QHS, Wynell Balloon, RPH, 40 mg at 11/15/15 2144 .  feeding supplement (VITAL AF 1.2 CAL) liquid 1,000 mL, 1,000 mL, Per Tube, Continuous, Asencion Islam, RD, Last Rate:  45 mL/hr at 11/16/15 0700, 1,000 mL at 11/16/15 0700 .  fentaNYL (SUBLIMAZE) injection 50 mcg, 50 mcg, Intravenous, Q2H PRN, Donita Brooks, NP, 50 mcg at 11/16/15 0400 .  hydrALAZINE (APRESOLINE) injection 10 mg, 10 mg, Intravenous, Q6H PRN, Nishant Dhungel, MD, 10 mg at 11/15/15 2358 .  [START ON 11/17/2015] lacosamide (VIMPAT) 150 mg in sodium chloride 0.9 % 25 mL IVPB, 150 mg, Intravenous, Q12H, Susa Bones Fuller Mandril, MD .  levETIRAcetam (KEPPRA) 1,500 mg in sodium chloride 0.9 % 100 mL IVPB, 1,500 mg, Intravenous, Q12H, Allie Bossier, MD, Last Rate: 460 mL/hr at 11/16/15 0929, 1,500 mg at 11/16/15 0929 .  pantoprazole sodium (PROTONIX) 40 mg/20 mL oral suspension 40 mg, 40 mg, Per Tube, Q24H, Chesley Mires, MD, 40 mg at 11/16/15 0742 .  phenytoin (DILANTIN) injection 100 mg, 100 mg, Intravenous, Q12H, Romona Curls, RPH, 100 mg at 11/16/15 1527 .  pneumococcal 23 valent vaccine (PNU-IMMUNE) injection 0.5 mL, 0.5 mL, Intramuscular, Prior to discharge, Velvet Bathe, MD .  sodium chloride 0.9 % bolus 500 mL, 500 mL, Intravenous, Once, Gardiner Barefoot, NP   Neurologic Examination:  Today's Vitals   11/16/15 1500 11/16/15 1532 11/16/15 1600 11/16/15 1700  BP: 159/80 159/80 162/80 166/83  Pulse: 115 114 109 110  Temp: 99.9 F (37.7 C)  100 F (37.8 C) 100 F (37.8 C)  TempSrc:   Core (Comment)   Resp: 16 20 15 19   Weight:      SpO2: 100% 100% 100% 100%  PainSc:        Intubated, very drowsy open his eyes to verbal command does not follow commands. With tactile sensation she withdraws her limbs has rigidity and resting tremor in bilateral upper and lower extremities.    Lab Results: Basic Metabolic Panel:  Recent Labs Lab 11/12/15 0625 11/13/15 0520 11/14/15 0500 11/15/15 0910 11/16/15 0540  NA 137 139 138 142 141  K 2.8* 4.0 3.6 2.8* 4.2  CL 104 107 103 100* 103  CO2  26 27 27 31 30   GLUCOSE 141* 117* 120* 139* 156*  BUN 14 17 18 20  21*  CREATININE 0.41* 0.42* 0.32* 0.49 0.41*  CALCIUM 8.4* 8.7* 9.1 9.6 9.9  MG  --  1.2*  --   --  1.5*    Liver Function Tests:  Recent Labs Lab 11/13/15 1010  AST 41  ALT 30  ALKPHOS 78  BILITOT 0.4  PROT 5.3*  ALBUMIN 1.8*   No results for input(s): LIPASE, AMYLASE in the last 168 hours. No results for input(s): AMMONIA in the last 168 hours.  CBC:  Recent Labs Lab 11/10/15 0630 11/11/15 1015 11/12/15 0625  WBC 4.8 4.3 5.9  HGB 8.7* 8.9* 8.3*  HCT 26.7* 27.6* 25.8*  MCV 92.1 92.6 92.5  PLT 351 231 277    Cardiac Enzymes: No results for input(s): CKTOTAL, CKMB, CKMBINDEX, TROPONINI in the last 168 hours.  Lipid Panel:  Recent Labs Lab 11/10/15 0132 11/13/15 0020  TRIG 133 159*    CBG:  Recent Labs Lab 11/15/15 2345 11/16/15 0330 11/16/15 0815 11/16/15 1205 11/16/15 1604  GLUCAP 128* 146* 131* 136* 138*    Microbiology: Results for orders placed or performed during the hospital encounter of 11/04/15  Culture, Urine     Status: None   Collection Time: 11/05/15 12:05 AM  Result Value Ref Range Status   Specimen Description Urine  Final   Special Requests NONE  Final   Culture 70,000 COLONIES/ml ENTEROCOCCUS SPECIES  Final   Report Status 11/07/2015 FINAL  Final   Organism ID, Bacteria ENTEROCOCCUS SPECIES  Final      Susceptibility   Enterococcus species - MIC*    AMPICILLIN <=2 SENSITIVE Sensitive     VANCOMYCIN 1 SENSITIVE Sensitive     GENTAMICIN SYNERGY SENSITIVE Sensitive     * 70,000 COLONIES/ml ENTEROCOCCUS SPECIES  Culture, blood (routine x 2)     Status: None   Collection Time: 11/05/15  9:50 AM  Result Value Ref Range Status   Specimen Description BLOOD RIGHT HAND  Final   Special Requests IN PEDIATRIC BOTTLE 1CC  Final   Culture NO GROWTH 5 DAYS  Final   Report Status 11/10/2015 FINAL  Final  Culture, blood (routine x 2)     Status: None   Collection Time:  11/05/15  9:53 AM  Result Value Ref Range Status   Specimen Description BLOOD RIGHT ANTECUBITAL  Final   Special Requests BOTTLES DRAWN AEROBIC AND ANAEROBIC 5CC  Final   Culture NO GROWTH 5 DAYS  Final   Report Status 11/10/2015 FINAL  Final  MRSA PCR Screening  Status: None   Collection Time: 11/12/15  6:16 PM  Result Value Ref Range Status   MRSA by PCR NEGATIVE NEGATIVE Final    Comment:        The GeneXpert MRSA Assay (FDA approved for NASAL specimens only), is one component of a comprehensive MRSA colonization surveillance program. It is not intended to diagnose MRSA infection nor to guide or monitor treatment for MRSA infections.     Imaging: Dg Chest Port 1 View  11/16/2015  CLINICAL DATA:  Acute respiratory failure.  Intubated patient. EXAM: PORTABLE CHEST 1 VIEW COMPARISON:  Single-view of the chest 11/12/2015 and 11/11/2015. FINDINGS: Support tubes and lines are unchanged and project in good position. Minimal basilar atelectasis is seen in the right. No pneumothorax or pleural effusion. Heart size normal. IMPRESSION: Support apparatus projects in good position.  No acute disease. Electronically Signed   By: Inge Rise M.D.   On: 11/16/2015 08:28    Assessment and plan:   Kathryn [name] is an 82 y.o. female patient with baseline Parkinson's disease, admitted with status epilepticus, continues to be intubated, remains drowsy. She's been off sedation with propofol and Versed for couple of days. No clinical evidence for seizures. She does have resting tremors from Parkinson's disease. At this point, recommend reducing Vimpat dose to 150 mg twice a day,  continue same doses of Keppra 1500 twice a day and Dilantin 100 mg twice a day. Guarded prognosis.  We'll follow-up.

## 2015-11-16 NOTE — Consult Note (Signed)
WOC wound consult note Reason for Consult: sacrum and heel Patient admitted 11/04/15, was transferred to 69M just a few days ago.   Wound type:  Sacrum; Deep Tissue Pressure Injury; 3cm x 1.5cm x 0.1 evolving with some superficial skin peeling, dark purple at base, moist Left heel: Deet Tissue Pressure Injury: 3cm x 3cm x 0; resolving, light purple, not fluid filled, but appears may have been; skin intact  Pressure Ulcer POA: No Measurement: see above Wound bed: see above Drainage (amount, consistency, odor) minimal at sacrum, serous, no odor.  None from the heel Periwound: intact Dressing procedure/placement/frequency: New foot drop boots placed this week, this is keeping heel off the bed, I assess and heel is floating in the boot.  Will need to monitor site closely for any changes.  Sacral foam dressing to protect and insulate area, monitor for further evolution.  SPORT mattress in place for pressure redistribution.    [place] team will follow along with you for weekly wound assessments.  Please notify me of any acute changes in the wounds or any new areas of concerns Para March RN,CWOCN [id number]

## 2015-11-16 NOTE — Progress Notes (Signed)
PULMONARY / CRITICAL CARE MEDICINE   Name: Kathryn [name] MRN: [id number] DOB: Oct 07, 1930    ADMISSION DATE:  11/04/2015 CONSULTATION DATE:  11/09/15  REFERRING MD:  Neurology / Dr. Nicole Kindred   CHIEF COMPLAINT: Seizure   SUBJECTIVE:   Propofol and versed have been off 48h+ A bit more responsive to stim, but still becomes apneic on PSV  VITAL SIGNS: BP 144/57 mmHg  Pulse 112  Temp(Src) 100 F (37.8 C) (Core (Comment))  Resp 17  Wt 53.8 kg (118 lb 9.7 oz)  SpO2 100%  VENTILATOR SETTINGS: Vent Mode:  [-] PRVC FiO2 (%):  [40 %] 40 % Set Rate:  [16 bmp] 16 bmp Vt Set:  [420 mL] 420 mL PEEP:  [5 cmH20] 5 cmH20 Pressure Support:  [8 cmH20] 8 cmH20 Plateau Pressure:  [13 cmH20-17 cmH20] 15 cmH20  INTAKE / OUTPUT: I/O last 3 completed shifts: In: 2715.4 [I.V.:385.4; NG/GT:1850; IV Piggyback:480] Out: [id number] [Urine:3720]  PHYSICAL EXAMINATION: General: sedated Neuro: RASS -3 HEENT: ETT in place Cardiac: regular Chest: no wheeze Abd: soft, non tender Ext: no edema Skin: sores on heels  LABS:  BMET  Recent Labs Lab 11/14/15 0500 11/15/15 0910 11/16/15 0540  NA 138 142 141  K 3.6 2.8* 4.2  CL 103 100* 103  CO2 27 31 30   BUN 18 20 21*  CREATININE 0.32* 0.49 0.41*  GLUCOSE 120* 139* 156*    Electrolytes  Recent Labs Lab 11/13/15 0520 11/14/15 0500 11/15/15 0910 11/16/15 0540  CALCIUM 8.7* 9.1 9.6 9.9  MG 1.2*  --   --  1.5*    CBC  Recent Labs Lab 11/10/15 0630 11/11/15 1015 11/12/15 0625  WBC 4.8 4.3 5.9  HGB 8.7* 8.9* 8.3*  HCT 26.7* 27.6* 25.8*  PLT 351 231 277    Coag's No results for input(s): APTT, INR in the last 168 hours.  Sepsis Markers No results for input(s): LATICACIDVEN, PROCALCITON, O2SATVEN in the last 168 hours.  ABG  Recent Labs Lab 11/09/15 1750  PHART 7.470*  PCO2ART 33.9*  PO2ART 204*    Liver Enzymes  Recent Labs Lab 11/13/15 1010  AST 41  ALT 30  ALKPHOS 78  BILITOT 0.4  ALBUMIN 1.8*    Cardiac  Enzymes No results for input(s): TROPONINI, PROBNP in the last 168 hours.  Glucose  Recent Labs Lab 11/15/15 1146 11/15/15 1617 11/15/15 1917 11/15/15 2345 11/16/15 0330 11/16/15 0815  GLUCAP 120* 136* 118* 128* 146* 131*    Imaging Dg Chest Port 1 View  11/16/2015  CLINICAL DATA:  Acute respiratory failure.  Intubated patient. EXAM: PORTABLE CHEST 1 VIEW COMPARISON:  Single-view of the chest 11/12/2015 and 11/11/2015. FINDINGS: Support tubes and lines are unchanged and project in good position. Minimal basilar atelectasis is seen in the right. No pneumothorax or pleural effusion. Heart size normal. IMPRESSION: Support apparatus projects in good position.  No acute disease. Electronically Signed   By: Inge Rise M.D.   On: 11/16/2015 08:28     STUDIES:  3/10 Continuous EEG >> Left hemispheric PLEDS were present with maximum negativity in the left posterotemporal/parietal cortex, although less developed, and less organized essentially unchanged compare to day 3 of recording  There were no subclinical seizures 3/13 continueous EEG >> burst suppression pattern but no seizure focus  CULTURES: HIV 3/6 >> negative  BCx2 3/6 >> negative UC 3/6 >> enterococcus >> pan sensitive   ANTIBIOTICS: Aztreonam 3/6 >> 3/6  Fortaz 3/6 >> 3/8 Vanco 3/6 >> 3/12  SIGNIFICANT EVENTS:  3/04  Discharged after episode of status epilepticus 3/05  Readmitted  LINES/TUBES: ETT 3/10 >> Lt IJ CVL 3/11 >>  DISCUSSION: [name] with PMH of CVA and recent admission for status epilepticus readmitted 3/5 for repeat seizure activity.  Despite AED's, the patient continues to have seizures on continuous EEG.  PCCM called for elective intubation for deep sedation to control seizures.   ASSESSMENT / PLAN:   NEUROLOGIC A:   Acute Encephalopathy in setting of Status Epilepticus, seizures apparently resolved Hx CVA, Parkinson's Disease P:   RASS goal: -1 to 0 Continuous EEG discontinued on 3/15  p.m. Versed gtt off 3/15, Propofol off 3/14 Continue Vimpat, Keppra, Dilantin Fentanyl when necessary while intubated Foot drop boots Continue to discuss goals of care with family, particularly with regard to whether she would be reintubated if we reach the point of extubation once sedation lifted   PULMONARY A: OSA - on CPAP Hx Difficult Airway  Compromised airway in setting of seizures P:   Decrease PRVC set rate to 10, let her breathe over the set rate Continue trials of PSV, mental status still limiting but improving off sedating meds  CARDIOVASCULAR A:  Hx Hypertension  Shock, resolved, suspect related to medication / sedation although note has had enterococcal UTI DNR  P:  Monitor hemodynamics  RENAL A:   Hypervolemia. Hypokalemia P:   KVO IV fluids Repeat lasix on 3/17 Replace electrolytes as indicated  GASTROINTESTINAL A:   Nutrition. P:   Tube feeds Protonix for SUP  HEMATOLOGIC A:   Anemia of critical illness and chronic disease P:  Trend CBC  Lovenox for DVT prophylaxis   INFECTIOUS A:   Enterococcus UTI >> multiple Abx allergies P:   Completed 7 days vanco 3/12 Follow off abx for now  ENDOCRINE A:   No acute issues  P:   Monitor glucose on BMP   Family: updated her son at bedside 3/17  Independent CC time 75 minutes   Baltazar Apo, MD, PhD 11/16/2015, 11:32 AM [facility] [phone] or if no answer [phone]

## 2015-11-17 LAB — BASIC METABOLIC PANEL
ANION GAP: 14 (ref 5–15)
BUN: 28 mg/dL — ABNORMAL HIGH (ref 6–20)
CALCIUM: 10.6 mg/dL — AB (ref 8.9–10.3)
CO2: 30 mmol/L (ref 22–32)
CREATININE: 0.44 mg/dL (ref 0.44–1.00)
Chloride: 102 mmol/L (ref 101–111)
GLUCOSE: 148 mg/dL — AB (ref 65–99)
Potassium: 3.5 mmol/L (ref 3.5–5.1)
Sodium: 146 mmol/L — ABNORMAL HIGH (ref 135–145)

## 2015-11-17 LAB — GLUCOSE, CAPILLARY
GLUCOSE-CAPILLARY: 137 mg/dL — AB (ref 65–99)
GLUCOSE-CAPILLARY: 126 mg/dL — AB (ref 65–99)
GLUCOSE-CAPILLARY: 119 mg/dL — AB (ref 65–99)
GLUCOSE-CAPILLARY: 112 mg/dL — AB (ref 65–99)
Glucose-Capillary: 138 mg/dL — ABNORMAL HIGH (ref 65–99)
Glucose-Capillary: 144 mg/dL — ABNORMAL HIGH (ref 65–99)
Glucose-Capillary: 129 mg/dL — ABNORMAL HIGH (ref 65–99)

## 2015-11-17 MED ORDER — SODIUM CHLORIDE 0.9 % IV SOLN
1250.0000 mg | Freq: Two times a day (BID) | INTRAVENOUS | Status: DC
  Administered 2015-11-17 – 2015-11-18 (×3): 1250 mg via INTRAVENOUS
  Filled 2015-11-17 (×3): qty 12.5

## 2015-11-17 MED ORDER — SODIUM CHLORIDE 0.9 % IV SOLN: 75.0000 mg | Freq: Two times a day (BID) | INTRAVENOUS | Status: DC

## 2015-11-17 MED ORDER — SODIUM CHLORIDE 0.9 % IV SOLN
75.0000 mg | Freq: Two times a day (BID) | INTRAVENOUS | Status: DC
  Administered 2015-11-17 – 2015-11-18 (×2): 75 mg via INTRAVENOUS
  Filled 2015-11-17 (×6): qty 7.5

## 2015-11-17 MED ORDER — PHENYTOIN SODIUM 50 MG/ML IJ SOLN
50.0000 mg | Freq: Two times a day (BID) | INTRAMUSCULAR | Status: DC
  Administered 2015-11-17 – 2015-11-18 (×3): 50 mg via INTRAVENOUS
  Filled 2015-11-17 (×3): qty 2

## 2015-11-17 NOTE — Progress Notes (Signed)
Interval History:                                                                                                                      Kathryn [name] is an 82 y.o. female patient with  recent seizures as described in my prior notes. She continues to be very drowsy. No evidence of clinical seizures No family at bedside  Past Medical History: Past Medical History  Diagnosis Date  . Depression   . Hypertension   . High cholesterol   . Rectal prolapse   . Hx of adenomatous colonic polyps   . Internal hemorrhoids   . Hypothyroidism   . Fecal incontinence   . TIA (transient ischemic attack)   . History of frequent urinary tract infections     recent  . Migraine   . Fatty liver 03/20/13  . Generalized ischemic cerebrovascular disease 10/09/2015  . Stroke [facility]) Sept 1, 2010; Sept 12, 2011    Past Surgical History  Procedure Laterality Date  . Colon surgery  2010, for prolapsed organs after hystecrtomy surgery  . Appendectomy  2008  . Cholecystectomy    . Lumbar laminectomy/decompression microdiscectomy N/A 01/07/2013    Procedure: LUMBAR LAMINECTOMY CENTRAL DECOMPRESSION L4-L5, BILATERAL FORAMENOTOMY L4,L5    ;  Surgeon: Tobi Bastos, MD;  Location: [facility];  Service: Orthopedics;  Laterality: N/A;  . Back surgery    . Hemorrhoid surgery  09/2008    Archie Endo 12/31/2010  . Abdominal hysterectomy  05/2007    Archie Endo 01/02/2011    Family History: Family History  Problem Relation Age of Onset  . Stroke Father   . Migraines Father   . CVA Father   . Heart attack Father   . Hypertension Maternal Aunt     x3  . Colon cancer Neg Hx   . CVA Mother     Social History:   reports that she has never smoked. She has never used smokeless tobacco. She reports that she does not drink alcohol or use illicit drugs.  Allergies:  Allergies  Allergen Reactions  . Augmentin [Amoxicillin-Pot Clavulanate] Swelling and Diarrhea    Throat   . Citalopram Swelling    Eyes ears and throat swelling Throat  and eyes   . Codeine Anaphylaxis and Shortness Of Breath  . Fish-Derived Products Anaphylaxis  . Hyoscyamine Anaphylaxis and Swelling  . Ibuprofen Swelling    Throat and eyes   . Latex Swelling and Rash    Swelling to troat  . Lipitor [Atorvastatin] Swelling    Muscle aches throat  . Septra [Bactrim] Anaphylaxis  . Shellfish Allergy Anaphylaxis  . Miconazole Rash  . Keflet [Cephalexin] Diarrhea    Upset stomach  . Meloxicam Diarrhea and Nausea And Vomiting  . Verapamil Other (See Comments)    Tachycardia and flushing  . Vicodin [Hydrocodone-Acetaminophen] Other (See Comments)    stroke  . Zoloft [Sertraline Hcl] Swelling    Red spots all over face, swelling of tongue and legs.   Marland Kitchen  Ciprofloxacin Nausea And Vomiting    Other reaction(s): Abdominal Pain  . Depakene [Valproate Sodium] Hives    All over body   . Isoptin Sr [Verapamil Hcl Er] Cough  . Lisinopril Cough    Fatigue and cough  . Sulfa Antibiotics Rash    Rash and also throat was tight  . Telmisartan-Hctz Cough     Medications:                                                                                                                         Current facility-administered medications:  .  0.9 %  sodium chloride infusion, , Intravenous, Continuous, Chesley Mires, MD, Last Rate: 10 mL/hr at 11/16/15 0700 .  antiseptic oral rinse solution (CORINZ), 7 mL, Mouth Rinse, 10 times per day, Javier Glazier, MD, 7 mL at 11/17/15 1730 .  bisacodyl (DULCOLAX) suppository 10 mg, 10 mg, Rectal, Daily PRN, Donita Brooks, NP, 10 mg at 11/13/15 1027 .  chlorhexidine gluconate (PERIDEX) 0.12 % solution 15 mL, 15 mL, Mouth Rinse, BID, Javier Glazier, MD, 15 mL at 11/17/15 [id number] .  enoxaparin (LOVENOX) injection 40 mg, 40 mg, Subcutaneous, QHS, Wynell Balloon, RPH, 40 mg at 11/16/15 2240 .  feeding supplement (VITAL AF 1.2 CAL) liquid 1,000 mL, 1,000 mL, Per Tube, Continuous, Asencion Islam, RD, Last Rate: 45 mL/hr at  11/17/15 1730, 1,000 mL at 11/17/15 1730 .  fentaNYL (SUBLIMAZE) injection 50 mcg, 50 mcg, Intravenous, Q2H PRN, Donita Brooks, NP, 50 mcg at 11/17/15 0123 .  hydrALAZINE (APRESOLINE) injection 10 mg, 10 mg, Intravenous, Q6H PRN, Nishant Dhungel, MD, 10 mg at 11/16/15 2238 .  lacosamide (VIMPAT) 75 mg in sodium chloride 0.9 % 25 mL IVPB, 75 mg, Intravenous, Q12H, Wynell Balloon, RPH .  levETIRAcetam (KEPPRA) 1,250 mg in sodium chloride 0.9 % 100 mL IVPB, 1,250 mg, Intravenous, Q12H, Beverley Sherrard Fuller Mandril, MD .  pantoprazole sodium (PROTONIX) 40 mg/20 mL oral suspension 40 mg, 40 mg, Per Tube, Q24H, Chesley Mires, MD, 40 mg at 11/17/15 0718 .  phenytoin (DILANTIN) injection 50 mg, 50 mg, Intravenous, Q12H, Joan Herschberger Fuller Mandril, MD .  pneumococcal 23 valent vaccine (PNU-IMMUNE) injection 0.5 mL, 0.5 mL, Intramuscular, Prior to discharge, Velvet Bathe, MD .  sodium chloride 0.9 % bolus 500 mL, 500 mL, Intravenous, Once, Gardiner Barefoot, NP   Neurologic Examination:                                                                                                     Today's  Vitals   11/17/15 1600 11/17/15 1700 11/17/15 1800 11/17/15 1900  BP: 160/74 151/71 142/65 157/73  Pulse: 110 113 112 113  Temp: 100.6 F (38.1 C) 100.8 F (38.2 C) 100.9 F (38.3 C) 100.9 F (38.3 C)  TempSrc: Core (Comment)     Resp: 14 16 13 14   Weight:      SpO2: 100% 100% 99% 99%  PainSc:        Intubated, drowsy, opens eyes to verbal command. Does not follow commands. Withdraws limbs to stimulation. Does have rigidity in all 4 extremities secondary to Parkinson's disease intermittent resting tremor noted.    Lab Results: Basic Metabolic Panel:  Recent Labs Lab 11/13/15 0520 11/14/15 0500 11/15/15 0910 11/16/15 0540 11/17/15 0527  NA 139 138 142 141 146*  K 4.0 3.6 2.8* 4.2 3.5  CL 107 103 100* 103 102  CO2 27 27 31 30 30   GLUCOSE 117* 120* 139* 156* 148*  BUN 17 18 20  21* 28*   CREATININE 0.42* 0.32* 0.49 0.41* 0.44  CALCIUM 8.7* 9.1 9.6 9.9 10.6*  MG 1.2*  --   --  1.5*  --     Liver Function Tests:  Recent Labs Lab 11/13/15 1010  AST 41  ALT 30  ALKPHOS 78  BILITOT 0.4  PROT 5.3*  ALBUMIN 1.8*   No results for input(s): LIPASE, AMYLASE in the last 168 hours. No results for input(s): AMMONIA in the last 168 hours.  CBC:  Recent Labs Lab 11/11/15 1015 11/12/15 0625  WBC 4.3 5.9  HGB 8.9* 8.3*  HCT 27.6* 25.8*  MCV 92.6 92.5  PLT 231 277    Cardiac Enzymes: No results for input(s): CKTOTAL, CKMB, CKMBINDEX, TROPONINI in the last 168 hours.  Lipid Panel:  Recent Labs Lab 11/13/15 0020  TRIG 159*    CBG:  Recent Labs Lab 11/16/15 2311 11/17/15 0320 11/17/15 0738 11/17/15 1154 11/17/15 1554  GLUCAP 138* 144* 129* 137* 126*    Microbiology: Results for orders placed or performed during the hospital encounter of 11/04/15  Culture, Urine     Status: None   Collection Time: 11/05/15 12:05 AM  Result Value Ref Range Status   Specimen Description Urine  Final   Special Requests NONE  Final   Culture 70,000 COLONIES/ml ENTEROCOCCUS SPECIES  Final   Report Status 11/07/2015 FINAL  Final   Organism ID, Bacteria ENTEROCOCCUS SPECIES  Final      Susceptibility   Enterococcus species - MIC*    AMPICILLIN <=2 SENSITIVE Sensitive     VANCOMYCIN 1 SENSITIVE Sensitive     GENTAMICIN SYNERGY SENSITIVE Sensitive     * 70,000 COLONIES/ml ENTEROCOCCUS SPECIES  Culture, blood (routine x 2)     Status: None   Collection Time: 11/05/15  9:50 AM  Result Value Ref Range Status   Specimen Description BLOOD RIGHT HAND  Final   Special Requests IN PEDIATRIC BOTTLE 1CC  Final   Culture NO GROWTH 5 DAYS  Final   Report Status 11/10/2015 FINAL  Final  Culture, blood (routine x 2)     Status: None   Collection Time: 11/05/15  9:53 AM  Result Value Ref Range Status   Specimen Description BLOOD RIGHT ANTECUBITAL  Final   Special Requests  BOTTLES DRAWN AEROBIC AND ANAEROBIC 5CC  Final   Culture NO GROWTH 5 DAYS  Final   Report Status 11/10/2015 FINAL  Final  MRSA PCR Screening     Status: None   Collection Time: 11/12/15  6:16 PM  Result Value Ref Range Status   MRSA by PCR NEGATIVE NEGATIVE Final    Comment:        The GeneXpert MRSA Assay (FDA approved for NASAL specimens only), is one component of a comprehensive MRSA colonization surveillance program. It is not intended to diagnose MRSA infection nor to guide or monitor treatment for MRSA infections.     Imaging: Dg Chest Port 1 View  11/16/2015  CLINICAL DATA:  Acute respiratory failure.  Intubated patient. EXAM: PORTABLE CHEST 1 VIEW COMPARISON:  Single-view of the chest 11/12/2015 and 11/11/2015. FINDINGS: Support tubes and lines are unchanged and project in good position. Minimal basilar atelectasis is seen in the right. No pneumothorax or pleural effusion. Heart size normal. IMPRESSION: Support apparatus projects in good position.  No acute disease. Electronically Signed   By: Inge Rise M.D.   On: 11/16/2015 08:28    Assessment and plan:   Kathryn [name] is an 82 y.o. female patient with recent seizures with status epilepticus, now resolved. Currently she is on Vimpat and Keppra and Dilantin. Continues to be very drowsy, does not follow commands.  To be able to improve her level of alertness and to work towards extubation, recommend further reducing the seizure medication doses as follows, will reduce Vimpat to 75 mg twice a day, Keppra 1250 mg twice a day and Dilantin  50 mg twice a day. We'll follow-up.

## 2015-11-17 NOTE — Progress Notes (Signed)
PULMONARY / CRITICAL CARE MEDICINE   Name: Kathryn [name] MRN: [id number] DOB: 01/08/1931    ADMISSION DATE:  11/04/2015 CONSULTATION DATE:  11/09/15  REFERRING MD:  Neurology / Dr. Nicole Kindred   CHIEF COMPLAINT: Seizure   SUBJECTIVE:   Propofol and versed have been off for > 3 days A bit more responsive to stim, but not tolerating PSV  VITAL SIGNS: BP 145/74 mmHg  Pulse 112  Temp(Src) 100.8 F (38.2 C) (Core (Comment))  Resp 13  Wt 51.3 kg (113 lb 1.5 oz)  SpO2 100%  VENTILATOR SETTINGS: Vent Mode:  [-] PRVC FiO2 (%):  [40 %] 40 % Set Rate:  [10 bmp] 10 bmp Vt Set:  [420 mL] 420 mL PEEP:  [5 cmH20] 5 cmH20 Plateau Pressure:  [13 cmH20-18 cmH20] 14 cmH20  INTAKE / OUTPUT: I/O last 3 completed shifts: In: 2515 [I.V.:355; NG/GT:1800; IV Piggyback:360] Out: 2465 [Urine:2465]  PHYSICAL EXAMINATION: General: sedated Neuro: RASS -3 HEENT: ETT in place Cardiac: regular Chest: no wheeze Abd: soft, non tender Ext: no edema Skin: sores on heels  LABS:  BMET  Recent Labs Lab 11/15/15 0910 11/16/15 0540 11/17/15 0527  NA 142 141 146*  K 2.8* 4.2 3.5  CL 100* 103 102  CO2 31 30 30   BUN 20 21* 28*  CREATININE 0.49 0.41* 0.44  GLUCOSE 139* 156* 148*    Electrolytes  Recent Labs Lab 11/13/15 0520  11/15/15 0910 11/16/15 0540 11/17/15 0527  CALCIUM 8.7*  < > 9.6 9.9 10.6*  MG 1.2*  --   --  1.5*  --   < > = values in this interval not displayed.  CBC  Recent Labs Lab 11/11/15 1015 11/12/15 0625  WBC 4.3 5.9  HGB 8.9* 8.3*  HCT 27.6* 25.8*  PLT 231 277    Coag's No results for input(s): APTT, INR in the last 168 hours.  Sepsis Markers No results for input(s): LATICACIDVEN, PROCALCITON, O2SATVEN in the last 168 hours.  ABG No results for input(s): PHART, PCO2ART, PO2ART in the last 168 hours.  Liver Enzymes  Recent Labs Lab 11/13/15 1010  AST 41  ALT 30  ALKPHOS 78  BILITOT 0.4  ALBUMIN 1.8*    Cardiac Enzymes No results for  input(s): TROPONINI, PROBNP in the last 168 hours.  Glucose  Recent Labs Lab 11/16/15 1205 11/16/15 1604 11/16/15 1956 11/16/15 2311 11/17/15 0320 11/17/15 0738  GLUCAP 136* 138* 115* 138* 144* 129*    Imaging No results found.   STUDIES:  3/10 Continuous EEG >> Left hemispheric PLEDS were present with maximum negativity in the left posterotemporal/parietal cortex, although less developed, and less organized essentially unchanged compare to day 3 of recording  There were no subclinical seizures 3/13 continueous EEG >> burst suppression pattern but no seizure focus  CULTURES: HIV 3/6 >> negative  BCx2 3/6 >> negative UC 3/6 >> enterococcus >> pan sensitive   ANTIBIOTICS: Aztreonam 3/6 >> 3/6  Fortaz 3/6 >> 3/8 Vanco 3/6 >> 3/12  SIGNIFICANT EVENTS: 3/04  Discharged after episode of status epilepticus 3/05  Readmitted  LINES/TUBES: ETT 3/10 >> Lt IJ CVL 3/11 >>  DISCUSSION: [name] with PMH of CVA and recent admission for status epilepticus readmitted 3/5 for repeat seizure activity.  Despite AED's, the patient continues to have seizures on continuous EEG.  PCCM called for elective intubation for deep sedation to control seizures.   ASSESSMENT / PLAN:   NEUROLOGIC A:   Acute Encephalopathy in setting of Status Epilepticus, seizures  apparently resolved Hx CVA, Parkinson's Disease P:   RASS goal: -1 to 0 Continuous EEG discontinued on 3/15 p.m. Versed gtt off 3/15, Propofol off 3/14 Continue Vimpat, Keppra, Dilantin; note Neurology recs to decrease Vimpat on 3/17.  Fentanyl when necessary while intubated Foot drop boots Continue to discuss goals of care with family, particularly with regard to whether she would be reintubated if we reach the point of extubation once sedation lifted   PULMONARY A: OSA - on CPAP Hx Difficult Airway  Compromised airway in setting of seizures P:   PRVC set rate to 10, let her breathe over the set rate Continue trials of  PSV, mental status still limiting but improving off sedating meds  CARDIOVASCULAR A:  Hx Hypertension  Shock, resolved, suspect related to medication / sedation although note has had enterococcal UTI DNR  P:  Monitor hemodynamics  RENAL A:   Hypervolemia. Hypokalemia P:   KVO IV fluids Repeat lasix on 3/17, hold on 3/18 Replace electrolytes as indicated  GASTROINTESTINAL A:   Nutrition. P:   Tube feeds Protonix for SUP  HEMATOLOGIC A:   Anemia of critical illness and chronic disease P:  Trend CBC  Lovenox for DVT prophylaxis   INFECTIOUS A:   Enterococcus UTI >> multiple Abx allergies P:   Completed 7 days vanco 3/12 Follow off abx for now  ENDOCRINE A:   No acute issues  P:   Monitor glucose on BMP   Family: updated her son at bedside 3/17  Independent CC time 39 minutes   Baltazar Apo, MD, PhD 11/17/2015, 10:09 AM [facility] [phone] or if no answer [phone]

## 2015-11-18 DIAGNOSIS — I10 Essential (primary) hypertension: Secondary | ICD-10-CM

## 2015-11-18 LAB — GLUCOSE, CAPILLARY
GLUCOSE-CAPILLARY: 118 mg/dL — AB (ref 65–99)
Glucose-Capillary: 129 mg/dL — ABNORMAL HIGH (ref 65–99)
Glucose-Capillary: 118 mg/dL — ABNORMAL HIGH (ref 65–99)
Glucose-Capillary: 125 mg/dL — ABNORMAL HIGH (ref 65–99)
Glucose-Capillary: 109 mg/dL — ABNORMAL HIGH (ref 65–99)

## 2015-11-18 MED ORDER — SODIUM CHLORIDE 0.9 % IV SOLN
1000.0000 mg | Freq: Two times a day (BID) | INTRAVENOUS | Status: DC
  Administered 2015-11-19 – 2015-12-03 (×29): 1000 mg via INTRAVENOUS
  Filled 2015-11-18 (×30): qty 10

## 2015-11-18 MED ORDER — SODIUM CHLORIDE 0.9 % IV SOLN
50.0000 mg | Freq: Two times a day (BID) | INTRAVENOUS | Status: DC
  Administered 2015-11-18 – 2015-11-22 (×9): 50 mg via INTRAVENOUS
  Filled 2015-11-18 (×21): qty 5

## 2015-11-18 NOTE — Progress Notes (Signed)
RT note- re attempt at weaning, sensitivity turned down for improved work of breathing. Dr. Lamonte Sakai aware.

## 2015-11-18 NOTE — Progress Notes (Signed)
Interval History:                                                                                                                      Kathryn [name] is an 82 y.o. female patient with  recent seizures as described in my prior notes. She continues to be very drowsy. No evidence of clinical seizures No family at bedside.   resp therapist attempted at weaning, sensitivity turned down for improved work of breathing.   Past Medical History: Past Medical History  Diagnosis Date  . Depression   . Hypertension   . High cholesterol   . Rectal prolapse   . Hx of adenomatous colonic polyps   . Internal hemorrhoids   . Hypothyroidism   . Fecal incontinence   . TIA (transient ischemic attack)   . History of frequent urinary tract infections     recent  . Migraine   . Fatty liver 03/20/13  . Generalized ischemic cerebrovascular disease 10/09/2015  . Stroke [facility]) Sept 1, 2010; Sept 12, 2011    Past Surgical History  Procedure Laterality Date  . Colon surgery  2010, for prolapsed organs after hystecrtomy surgery  . Appendectomy  2008  . Cholecystectomy    . Lumbar laminectomy/decompression microdiscectomy N/A 01/07/2013    Procedure: LUMBAR LAMINECTOMY CENTRAL DECOMPRESSION L4-L5, BILATERAL FORAMENOTOMY L4,L5    ;  Surgeon: Tobi Bastos, MD;  Location: [facility];  Service: Orthopedics;  Laterality: N/A;  . Back surgery    . Hemorrhoid surgery  09/2008    Archie Endo 12/31/2010  . Abdominal hysterectomy  05/2007    Archie Endo 01/02/2011    Family History: Family History  Problem Relation Age of Onset  . Stroke Father   . Migraines Father   . CVA Father   . Heart attack Father   . Hypertension Maternal Aunt     x3  . Colon cancer Neg Hx   . CVA Mother     Social History:   reports that she has never smoked. She has never used smokeless tobacco. She reports that she does not drink alcohol or use illicit drugs.  Allergies:  Allergies  Allergen Reactions  . Augmentin [Amoxicillin-Pot Clavulanate]  Swelling and Diarrhea    Throat   . Citalopram Swelling    Eyes ears and throat swelling Throat and eyes   . Codeine Anaphylaxis and Shortness Of Breath  . Fish-Derived Products Anaphylaxis  . Hyoscyamine Anaphylaxis and Swelling  . Ibuprofen Swelling    Throat and eyes   . Latex Swelling and Rash    Swelling to troat  . Lipitor [Atorvastatin] Swelling    Muscle aches throat  . Septra [Bactrim] Anaphylaxis  . Shellfish Allergy Anaphylaxis  . Miconazole Rash  . Keflet [Cephalexin] Diarrhea    Upset stomach  . Meloxicam Diarrhea and Nausea And Vomiting  . Verapamil Other (See Comments)    Tachycardia and flushing  . Vicodin [Hydrocodone-Acetaminophen] Other (See Comments)    stroke  . Zoloft [Sertraline  Hcl] Swelling    Red spots all over face, swelling of tongue and legs.   . Ciprofloxacin Nausea And Vomiting    Other reaction(s): Abdominal Pain  . Depakene [Valproate Sodium] Hives    All over body   . Isoptin Sr [Verapamil Hcl Er] Cough  . Lisinopril Cough    Fatigue and cough  . Sulfa Antibiotics Rash    Rash and also throat was tight  . Telmisartan-Hctz Cough     Medications:                                                                                                                         Current facility-administered medications:  .  0.9 %  sodium chloride infusion, , Intravenous, Continuous, Chesley Mires, MD, Last Rate: 10 mL/hr at 11/16/15 0700 .  antiseptic oral rinse solution (CORINZ), 7 mL, Mouth Rinse, 10 times per day, Javier Glazier, MD, 7 mL at 11/18/15 1807 .  bisacodyl (DULCOLAX) suppository 10 mg, 10 mg, Rectal, Daily PRN, Donita Brooks, NP, 10 mg at 11/13/15 1027 .  chlorhexidine gluconate (PERIDEX) 0.12 % solution 15 mL, 15 mL, Mouth Rinse, BID, Javier Glazier, MD, 15 mL at 11/18/15 2001 .  enoxaparin (LOVENOX) injection 40 mg, 40 mg, Subcutaneous, QHS, Wynell Balloon, RPH, 40 mg at 11/18/15 2113 .  feeding supplement (VITAL AF 1.2  CAL) liquid 1,000 mL, 1,000 mL, Per Tube, Continuous, Asencion Islam, RD, Last Rate: 45 mL/hr at 11/18/15 1709, 1,000 mL at 11/18/15 1709 .  fentaNYL (SUBLIMAZE) injection 50 mcg, 50 mcg, Intravenous, Q2H PRN, Donita Brooks, NP, 50 mcg at 11/17/15 0123 .  hydrALAZINE (APRESOLINE) injection 10 mg, 10 mg, Intravenous, Q6H PRN, Nishant Dhungel, MD, 10 mg at 11/16/15 2238 .  lacosamide (VIMPAT) 75 mg in sodium chloride 0.9 % 25 mL IVPB, 75 mg, Intravenous, Q12H, Wynell Balloon, RPH, 75 mg at 11/18/15 0906 .  levETIRAcetam (KEPPRA) 1,250 mg in sodium chloride 0.9 % 100 mL IVPB, 1,250 mg, Intravenous, Q12H, Adriana Lina Fuller Mandril, MD, 1,250 mg at 11/18/15 2113 .  pantoprazole sodium (PROTONIX) 40 mg/20 mL oral suspension 40 mg, 40 mg, Per Tube, Q24H, Chesley Mires, MD, 40 mg at 11/18/15 0849 .  phenytoin (DILANTIN) injection 50 mg, 50 mg, Intravenous, Q12H, Arif Amendola Fuller Mandril, MD, 50 mg at 11/18/15 2113 .  pneumococcal 23 valent vaccine (PNU-IMMUNE) injection 0.5 mL, 0.5 mL, Intramuscular, Prior to discharge, Velvet Bathe, MD   Neurologic Examination:  Today's Vitals   11/18/15 1700 11/18/15 1800 11/18/15 1900 11/18/15 2009  BP: 174/80 174/78 166/78   Pulse: 100 100 103 16  Temp: 99.9 F (37.7 C) 99.7 F (37.6 C) 100 F (37.8 C)   TempSrc:      Resp: 15 11 15    Weight:      SpO2: 100% 100% 100% 100%  PainSc:        Intubated, drowsy, opens eyes to verbal command. Does not follow commands. Withdraws limbs to stimulation. Does have rigidity in all 4 extremities secondary to Parkinson's disease intermittent resting tremor noted.    Lab Results: Basic Metabolic Panel:  Recent Labs Lab 11/13/15 0520 11/14/15 0500 11/15/15 0910 11/16/15 0540 11/17/15 0527  NA 139 138 142 141 146*  K 4.0 3.6 2.8* 4.2 3.5  CL 107 103 100* 103 102  CO2 27 27 31 30 30   GLUCOSE 117* 120* 139*  156* 148*  BUN 17 18 20  21* 28*  CREATININE 0.42* 0.32* 0.49 0.41* 0.44  CALCIUM 8.7* 9.1 9.6 9.9 10.6*  MG 1.2*  --   --  1.5*  --     Liver Function Tests:  Recent Labs Lab 11/13/15 1010  AST 41  ALT 30  ALKPHOS 78  BILITOT 0.4  PROT 5.3*  ALBUMIN 1.8*   No results for input(s): LIPASE, AMYLASE in the last 168 hours. No results for input(s): AMMONIA in the last 168 hours.  CBC:  Recent Labs Lab 11/12/15 0625  WBC 5.9  HGB 8.3*  HCT 25.8*  MCV 92.5  PLT 277    Cardiac Enzymes: No results for input(s): CKTOTAL, CKMB, CKMBINDEX, TROPONINI in the last 168 hours.  Lipid Panel:  Recent Labs Lab 11/13/15 0020  TRIG 159*    CBG:  Recent Labs Lab 11/18/15 0409 11/18/15 0729 11/18/15 1136 11/18/15 1539 11/18/15 1952  GLUCAP 118* 129* 118* 125* 109*    Microbiology: Results for orders placed or performed during the hospital encounter of 11/04/15  Culture, Urine     Status: None   Collection Time: 11/05/15 12:05 AM  Result Value Ref Range Status   Specimen Description Urine  Final   Special Requests NONE  Final   Culture 70,000 COLONIES/ml ENTEROCOCCUS SPECIES  Final   Report Status 11/07/2015 FINAL  Final   Organism ID, Bacteria ENTEROCOCCUS SPECIES  Final      Susceptibility   Enterococcus species - MIC*    AMPICILLIN <=2 SENSITIVE Sensitive     VANCOMYCIN 1 SENSITIVE Sensitive     GENTAMICIN SYNERGY SENSITIVE Sensitive     * 70,000 COLONIES/ml ENTEROCOCCUS SPECIES  Culture, blood (routine x 2)     Status: None   Collection Time: 11/05/15  9:50 AM  Result Value Ref Range Status   Specimen Description BLOOD RIGHT HAND  Final   Special Requests IN PEDIATRIC BOTTLE 1CC  Final   Culture NO GROWTH 5 DAYS  Final   Report Status 11/10/2015 FINAL  Final  Culture, blood (routine x 2)     Status: None   Collection Time: 11/05/15  9:53 AM  Result Value Ref Range Status   Specimen Description BLOOD RIGHT ANTECUBITAL  Final   Special Requests BOTTLES  DRAWN AEROBIC AND ANAEROBIC 5CC  Final   Culture NO GROWTH 5 DAYS  Final   Report Status 11/10/2015 FINAL  Final  MRSA PCR Screening     Status: None   Collection Time: 11/12/15  6:16 PM  Result Value Ref Range Status  MRSA by PCR NEGATIVE NEGATIVE Final    Comment:        The GeneXpert MRSA Assay (FDA approved for NASAL specimens only), is one component of a comprehensive MRSA colonization surveillance program. It is not intended to diagnose MRSA infection nor to guide or monitor treatment for MRSA infections.     Imaging: Dg Chest Port 1 View  11/16/2015  CLINICAL DATA:  Acute respiratory failure.  Intubated patient. EXAM: PORTABLE CHEST 1 VIEW COMPARISON:  Single-view of the chest 11/12/2015 and 11/11/2015. FINDINGS: Support tubes and lines are unchanged and project in good position. Minimal basilar atelectasis is seen in the right. No pneumothorax or pleural effusion. Heart size normal. IMPRESSION: Support apparatus projects in good position.  No acute disease. Electronically Signed   By: Inge Rise M.D.   On: 11/16/2015 08:28    Assessment and plan:   Kathryn [name] is an 82 y.o. female patient with recent seizures with status epilepticus, now resolved. Currently she is on Vimpat and Keppra and Dilantin. Continues to be very drowsy, does not follow commands. Yesterday therapist attempted at ventilator weaning, sensitivity turned down for improved work of breathing. To be able to improve her level of alertness and to work towards extubation, recommend further reducing the seizure medication doses as follows, will reduce Vimpat to 50 mg twice a day, Keppra 1000 mg twice a day and discontinue Dilantin.  Recommend to check a follow-up EEG tomorrow to monitor for recurrence of abnormal discharges as her seizure medications are being gradually weaned to improve her level of alertness.  We'll follow-up.

## 2015-11-18 NOTE — Progress Notes (Signed)
[place] for phenytoin Indication: seizures  Allergies  Allergen Reactions  . Augmentin [Amoxicillin-Pot Clavulanate] Swelling and Diarrhea    Throat   . Citalopram Swelling    Eyes ears and throat swelling Throat and eyes   . Codeine Anaphylaxis and Shortness Of Breath  . Fish-Derived Products Anaphylaxis  . Hyoscyamine Anaphylaxis and Swelling  . Ibuprofen Swelling    Throat and eyes   . Latex Swelling and Rash    Swelling to troat  . Lipitor [Atorvastatin] Swelling    Muscle aches throat  . Septra [Bactrim] Anaphylaxis  . Shellfish Allergy Anaphylaxis  . Miconazole Rash  . Keflet [Cephalexin] Diarrhea    Upset stomach  . Meloxicam Diarrhea and Nausea And Vomiting  . Verapamil Other (See Comments)    Tachycardia and flushing  . Vicodin [Hydrocodone-Acetaminophen] Other (See Comments)    stroke  . Zoloft [Sertraline Hcl] Swelling    Red spots all over face, swelling of tongue and legs.   . Ciprofloxacin Nausea And Vomiting    Other reaction(s): Abdominal Pain  . Depakene [Valproate Sodium] Hives    All over body   . Isoptin Sr [Verapamil Hcl Er] Cough  . Lisinopril Cough    Fatigue and cough  . Sulfa Antibiotics Rash    Rash and also throat was tight  . Telmisartan-Hctz Cough    Vital Signs: Temp: 100 F (37.8 C) (03/19 0900) BP: 166/73 mmHg (03/19 1226) Pulse Rate: 103 (03/19 1226) Intake/Output from previous day: 03/18 0701 - 03/19 0700 In: 1620 [I.V.:240; NG/GT:1080; IV Piggyback:300] Out: 1245 [Urine:1245] Intake/Output from this shift: Total I/O In: 55 [I.V.:10; NG/GT:45] Out: -   Labs:  Recent Labs  11/16/15 0540 11/17/15 0527  CREATININE 0.41* 0.44   Estimated Creatinine Clearance: 44.1 mL/min (by C-G formula based on Cr of 0.44). No results for input(s): VANCOTROUGH, VANCOPEAK, VANCORANDOM, GENTTROUGH, GENTPEAK, GENTRANDOM, TOBRATROUGH, TOBRAPEAK, TOBRARND, AMIKACINPEAK, AMIKACINTROU, AMIKACIN in the last  72 hours.    Medical History: Past Medical History  Diagnosis Date  . Depression   . Hypertension   . High cholesterol   . Rectal prolapse   . Hx of adenomatous colonic polyps   . Internal hemorrhoids   . Hypothyroidism   . Fecal incontinence   . TIA (transient ischemic attack)   . History of frequent urinary tract infections     recent  . Migraine   . Fatty liver 03/20/13  . Generalized ischemic cerebrovascular disease 10/09/2015  . Stroke [facility]) Sept 1, 2010; Sept 12, 2011    Medications:  Scheduled:  . antiseptic oral rinse  7 mL Mouth Rinse 10 times per day  . chlorhexidine gluconate  15 mL Mouth Rinse BID  . enoxaparin (LOVENOX) injection  40 mg Subcutaneous QHS  . lacosamide (VIMPAT) IV  75 mg Intravenous Q12H  . levETIRAcetam  1,250 mg Intravenous Q12H  . pantoprazole sodium  40 mg Per Tube Q24H  . phenytoin (DILANTIN) IV  50 mg Intravenous Q12H    Assessment: 82 yo female here with seizures on keppra, lacosamide and phenytoin. She was recently admitted for status epilepticus and discharged 3/4 on keppra.  Pharmacy has been consulted to dose phenytoin (started 3/7).  -3/8 phenytoin level= 15.8 (albumin= 2.7, corrected= 24) collected about 2 hours post-dose -3/14 phenytoin level = 14.3 (albumin 1.8, corrected=31)  MD noted there has been no re-occurence of seizures, continuous EEG showing burst suppression.  Goal of Therapy:  Phenytoin level= 10-20  Plan:  -Decrease phenytoin  to 100mg  IV q12h -Consider re-check phenytoin level in 5-7 days (3/20-3/21) to assess trend in concentration. True steady state not for 10-14 days  Kathryn [name], PharmD, BCPS, [facility] Clinical Pharmacist Pager [phone] 11/18/2015 12:31 PM

## 2015-11-18 NOTE — Progress Notes (Signed)
PULMONARY / CRITICAL CARE MEDICINE   Name: Kathryn [name] MRN: [id number] DOB: [birth year]    ADMISSION DATE:  11/04/2015 CONSULTATION DATE:  11/09/15  REFERRING MD:  Neurology / Dr. Nicole Kindred   CHIEF COMPLAINT: Seizure   SUBJECTIVE:   Seizure meds are being decreased, remains poorly responsive  VITAL SIGNS: BP 174/76 mmHg  Pulse 102  Temp(Src) 100 F (37.8 C) (Core (Comment))  Resp 17  Wt 55.6 kg (122 lb 9.2 oz)  SpO2 100%  VENTILATOR SETTINGS: Vent Mode:  [-] PRVC FiO2 (%):  [40 %] 40 % Set Rate:  [10 bmp] 10 bmp Vt Set:  [420 mL] 420 mL PEEP:  [5 cmH20] 5 cmH20 Pressure Support:  [10 cmH20] 10 cmH20 Plateau Pressure:  [14 cmH20-16 cmH20] 15 cmH20  INTAKE / OUTPUT: I/O last 3 completed shifts: In: 2430 [I.V.:355; NG/GT:1620; IV Piggyback:455] Out: [id number] [Urine:1615]  PHYSICAL EXAMINATION: General: sedated Neuro: RASS -3 HEENT: ETT in place Cardiac: regular Chest: no wheeze Abd: soft, non tender Ext: no edema Skin: sores on heels  LABS:  BMET  Recent Labs Lab 11/15/15 0910 11/16/15 0540 11/17/15 0527  NA 142 141 146*  K 2.8* 4.2 3.5  CL 100* 103 102  CO2 31 30 30   BUN 20 21* 28*  CREATININE 0.49 0.41* 0.44  GLUCOSE 139* 156* 148*    Electrolytes  Recent Labs Lab 11/13/15 0520  11/15/15 0910 11/16/15 0540 11/17/15 0527  CALCIUM 8.7*  < > 9.6 9.9 10.6*  MG 1.2*  --   --  1.5*  --   < > = values in this interval not displayed.  CBC  Recent Labs Lab 11/12/15 0625  WBC 5.9  HGB 8.3*  HCT 25.8*  PLT 277    Coag's No results for input(s): APTT, INR in the last 168 hours.  Sepsis Markers No results for input(s): LATICACIDVEN, PROCALCITON, O2SATVEN in the last 168 hours.  ABG No results for input(s): PHART, PCO2ART, PO2ART in the last 168 hours.  Liver Enzymes  Recent Labs Lab 11/13/15 1010  AST 41  ALT 30  ALKPHOS 78  BILITOT 0.4  ALBUMIN 1.8*    Cardiac Enzymes No results for input(s): TROPONINI, PROBNP in the last 168  hours.  Glucose  Recent Labs Lab 11/17/15 1154 11/17/15 1554 11/17/15 1938 11/17/15 2310 11/18/15 0409 11/18/15 0729  GLUCAP 137* 126* 119* 112* 118* 129*    Imaging No results found.   STUDIES:  3/10 Continuous EEG >> Left hemispheric PLEDS were present with maximum negativity in the left posterotemporal/parietal cortex, although less developed, and less organized essentially unchanged compare to day 3 of recording  There were no subclinical seizures 3/13 continueous EEG >> burst suppression pattern but no seizure focus  CULTURES: HIV 3/6 >> negative  BCx2 3/6 >> negative UC 3/6 >> enterococcus >> pan sensitive   ANTIBIOTICS: Aztreonam 3/6 >> 3/6  Fortaz 3/6 >> 3/8 Vanco 3/6 >> 3/12  SIGNIFICANT EVENTS: 3/04  Discharged after episode of status epilepticus 3/05  Readmitted  LINES/TUBES: ETT 3/10 >> Lt IJ CVL 3/11 >>  DISCUSSION: [name] with PMH of CVA and recent admission for status epilepticus readmitted 3/5 for repeat seizure activity.  Despite AED's, the patient continues to have seizures on continuous EEG.  PCCM called for elective intubation for deep sedation to control seizures.   ASSESSMENT / PLAN:   NEUROLOGIC A:   Acute Encephalopathy in setting of Status Epilepticus, seizures apparently resolved Hx CVA, Parkinson's Disease P:   RASS goal:  0 Continuous EEG discontinued on 3/15 p.m. Versed gtt off 3/15, Propofol off 3/14 Vimpat, Keppra, Dilantin dose adjustment per neurology plans. Being decreased. Unclear whether we will be able to find doses where she will be seizure free yet awake enough for SBT's.  Fentanyl when necessary while intubated Foot drop boots Continue to discuss goals of care with family, particularly with regard to whether she would be reintubated if we reach the point of extubation once sedation lifted   PULMONARY A: OSA - on CPAP Hx Difficult Airway  Compromised airway in setting of seizures P:   PRVC set rate to 10,  let her breathe over the set rate Continue trials of PSV, mental status still limiting but improving off sedating meds  CARDIOVASCULAR A:  Hx Hypertension  Shock, resolved, suspect related to medication / sedation although note has had enterococcal UTI DNR  P:  Monitor hemodynamics  RENAL A:   Hypervolemia. Hypokalemia P:   KVO IV fluids Dosing lasix by daily assessment, hold on 3/18 and 3/19 Replace electrolytes as indicated Follow BMP  GASTROINTESTINAL A:   Nutrition. P:   Tube feeds Protonix for SUP  HEMATOLOGIC A:   Anemia of critical illness and chronic disease P:  Trend CBC  Lovenox for DVT prophylaxis   INFECTIOUS A:   Enterococcus UTI >> multiple Abx allergies P:   Completed 7 days vanco 3/12 Follow off abx for now  ENDOCRINE A:   No acute issues  P:   Monitor glucose on BMP   Family: updated her son at bedside 3/17  Independent CC time 29 minutes   Baltazar Apo, MD, PhD 11/18/2015, 10:17 AM [facility] [phone] or if no answer [phone]

## 2015-11-19 ENCOUNTER — Inpatient Hospital Stay (HOSPITAL_COMMUNITY): Payer: Medicare Other

## 2015-11-19 DIAGNOSIS — G40901 Epilepsy, unspecified, not intractable, with status epilepticus: Secondary | ICD-10-CM

## 2015-11-19 DIAGNOSIS — R9401 Abnormal electroencephalogram [EEG]: Secondary | ICD-10-CM

## 2015-11-19 DIAGNOSIS — J9620 Acute and chronic respiratory failure, unspecified whether with hypoxia or hypercapnia: Secondary | ICD-10-CM

## 2015-11-19 LAB — GLUCOSE, CAPILLARY
GLUCOSE-CAPILLARY: 100 mg/dL — AB (ref 65–99)
GLUCOSE-CAPILLARY: 116 mg/dL — AB (ref 65–99)
GLUCOSE-CAPILLARY: 118 mg/dL — AB (ref 65–99)
GLUCOSE-CAPILLARY: 119 mg/dL — AB (ref 65–99)
Glucose-Capillary: 135 mg/dL — ABNORMAL HIGH (ref 65–99)
Glucose-Capillary: 132 mg/dL — ABNORMAL HIGH (ref 65–99)

## 2015-11-19 LAB — BASIC METABOLIC PANEL
ANION GAP: 9 (ref 5–15)
BUN: 24 mg/dL — ABNORMAL HIGH (ref 6–20)
CALCIUM: 9.7 mg/dL (ref 8.9–10.3)
CO2: 31 mmol/L (ref 22–32)
Chloride: 108 mmol/L (ref 101–111)
Creatinine, Ser: 0.33 mg/dL — ABNORMAL LOW (ref 0.44–1.00)
GLUCOSE: 130 mg/dL — AB (ref 65–99)
POTASSIUM: 3 mmol/L — AB (ref 3.5–5.1)
SODIUM: 148 mmol/L — AB (ref 135–145)

## 2015-11-19 LAB — MAGNESIUM: MAGNESIUM: 1.6 mg/dL — AB (ref 1.7–2.4)

## 2015-11-19 MED ORDER — CHLORHEXIDINE GLUCONATE 0.12 % MT SOLN
OROMUCOSAL | Status: AC
  Administered 2015-11-19: 15 mL via OROMUCOSAL
  Filled 2015-11-19: qty 15

## 2015-11-19 MED ORDER — POTASSIUM CHLORIDE 10 MEQ/100ML IV SOLN
10.0000 meq | INTRAVENOUS | Status: AC
  Administered 2015-11-19 (×2): 10 meq via INTRAVENOUS
  Filled 2015-11-19 (×2): qty 100

## 2015-11-19 MED ORDER — MAGNESIUM SULFATE 2 GM/50ML IV SOLN
2.0000 g | Freq: Once | INTRAVENOUS | Status: AC
  Administered 2015-11-19: 2 g via INTRAVENOUS
  Filled 2015-11-19: qty 50

## 2015-11-19 MED ORDER — POTASSIUM CHLORIDE 20 MEQ/15ML (10%) PO SOLN
40.0000 meq | Freq: Once | ORAL | Status: AC
  Administered 2015-11-19: 40 meq
  Filled 2015-11-19: qty 30

## 2015-11-19 NOTE — Progress Notes (Signed)
eLink Physician-Brief Progress Note Patient Name: NADA MEKEEL DOB: 02-Apr-1931 MRN: [id number]   Date of Service  11/19/2015  HPI/Events of Note  Hypokalemia 3.0 & hypomagnesemia 1.6.  eICU Interventions  1. Magnesium sulfate 2 g IV 1 2. Potassium chloride 40 mEq via tube 1 3. Potassium chloride 10 mEq IV 2 runs     Intervention Category Intermediate Interventions: Electrolyte abnormality - evaluation and management  Tera Partridge 11/19/2015, 4:22 AM

## 2015-11-19 NOTE — Progress Notes (Signed)
EEG Completed; Results Pending  

## 2015-11-19 NOTE — Progress Notes (Signed)
PULMONARY / CRITICAL CARE MEDICINE   Name: Kathryn [name] MRN: [id number] DOB: 1931-06-28    ADMISSION DATE:  11/04/2015 CONSULTATION DATE:  11/09/15  REFERRING MD:  Neurology / Dr. Nicole Kindred   CHIEF COMPLAINT: Seizure   [name] with PMH of CVA and recent admission for status epilepticus readmitted 3/5 for repeat seizure activity.  Despite AED's, the patient continues to have seizures on continuous EEG.  PCCM called for elective intubation for deep sedation to control seizures.   SUBJECTIVE:    remains poorly responsive EEG done Low gr febrile Good UO  VITAL SIGNS: BP 135/62 mmHg  Pulse 100  Temp(Src) 100 F (37.8 C) (Core (Comment))  Resp 11  Wt 117 lb 15.1 oz (53.5 kg)  SpO2 100%  VENTILATOR SETTINGS: Vent Mode:  [-] PSV;CPAP FiO2 (%):  [40 %] 40 % Set Rate:  [10 bmp] 10 bmp Vt Set:  [420 mL] 420 mL PEEP:  [5 cmH20] 5 cmH20 Pressure Support:  [8 cmH20-10 cmH20] 8 cmH20 Plateau Pressure:  [14 cmH20-15 cmH20] 15 cmH20  INTAKE / OUTPUT: I/O last 3 completed shifts: In: 2465 [I.V.:320; NG/GT:1575; IV Piggyback:570] Out: 2285 [Urine:2285]  PHYSICAL EXAMINATION: General:chr ill appearing Neuro: RASS -2, does not follow commands, eyelids flicker to name HEENT: ETT in place Cardiac: regular Chest: no wheeze Abd: soft, non tender Ext: no edema Skin: sores on heels  LABS:  BMET  Recent Labs Lab 11/16/15 0540 11/17/15 0527 11/19/15 0233  NA 141 146* 148*  K 4.2 3.5 3.0*  CL 103 102 108  CO2 30 30 31   BUN 21* 28* 24*  CREATININE 0.41* 0.44 0.33*  GLUCOSE 156* 148* 130*    Electrolytes  Recent Labs Lab 11/13/15 0520  11/16/15 0540 11/17/15 0527 11/19/15 0233  CALCIUM 8.7*  < > 9.9 10.6* 9.7  MG 1.2*  --  1.5*  --  1.6*  < > = values in this interval not displayed.  CBC No results for input(s): WBC, HGB, HCT, PLT in the last 168 hours.  Coag's No results for input(s): APTT, INR in the last 168 hours.  Sepsis Markers No results for input(s):  LATICACIDVEN, PROCALCITON, O2SATVEN in the last 168 hours.  ABG No results for input(s): PHART, PCO2ART, PO2ART in the last 168 hours.  Liver Enzymes  Recent Labs Lab 11/13/15 1010  AST 41  ALT 30  ALKPHOS 78  BILITOT 0.4  ALBUMIN 1.8*    Cardiac Enzymes No results for input(s): TROPONINI, PROBNP in the last 168 hours.  Glucose  Recent Labs Lab 11/18/15 1136 11/18/15 1539 11/18/15 1952 11/18/15 2330 11/19/15 0402 11/19/15 0812  GLUCAP 118* 125* 109* 100* 116* 118*    Imaging No results found.   STUDIES:  3/10 Continuous EEG >> Left hemispheric PLEDS were present with maximum negativity in the left posterotemporal/parietal cortex, although less developed, and less organized essentially unchanged compare to day 3 of recording  There were no subclinical seizures 3/13 continueous EEG >> burst suppression pattern but no seizure focus 3/20 EEG >>  CULTURES: HIV 3/6 >> negative  BCx2 3/6 >> negative UC 3/6 >> enterococcus >> pan sensitive   ANTIBIOTICS: Aztreonam 3/6 >> 3/6  Fortaz 3/6 >> 3/8 Vanco 3/6 >> 3/12  SIGNIFICANT EVENTS: 3/04  Discharged after episode of status epilepticus 3/05  Readmitted  LINES/TUBES: ETT 3/10 >> Lt IJ CVL 3/11 >>  DISCUSSION:   ASSESSMENT / PLAN:   NEUROLOGIC A:   Acute Encephalopathy in setting of recurrent Status Epilepticus, seizures apparently  resolved Hx CVA, Parkinson's Disease Continuous EEG discontinued on 3/15 p.m. Versed gtt off 3/15, Propofol off 3/14  P:   RASS goal: 0 Vimpat, Keppra, Dilantin dose adjustment per neurology plans. Being decreased.  Fentanyl when necessary while intubated Foot drop boots Continue to discuss goals of care with family, particularly with regard to whether she would be reintubated if we reach the point of extubation once sedation lifted   PULMONARY A: OSA - on CPAP Hx Difficult Airway  Compromised airway in setting of seizures P:   SBTs ok, but no extubation until  goals clarified   CARDIOVASCULAR A:  Hx Hypertension  Shock, resolved, suspect related to medication / sedation although note has had enterococcal UTI DNR  P:  Monitor hemodynamics  RENAL A:   Hypervolemia. Hypokalemia P:   Dosing lasix by daily assessment, hold for now Replace electrolytes as indicated Follow BMP  GASTROINTESTINAL A:   Nutrition. P:   Tube feeds Protonix for SUP  HEMATOLOGIC A:   Anemia of critical illness and chronic disease P:  Trend CBC  Lovenox for DVT prophylaxis   INFECTIOUS A:   Enterococcus UTI >> multiple Abx allergies, Completed 7 days vanco 3/12 P:   Follow off abx for now  ENDOCRINE A:   No acute issues  P:   Monitor glucose on BMP   Family: updated her son at bedside 3/17    The patient is critically ill with multiple organ systems failure and requires high complexity decision making for assessment and support, frequent evaluation and titration of therapies, application of advanced monitoring technologies and extensive interpretation of multiple databases. Critical Care Time devoted to patient care services described in this note independent of APP time is 31 minutes.   Kara Mead MD. Shade Flood. [facility] Pager [phone] If no response call [phone]   11/19/2015     11/19/2015, 9:50 AM

## 2015-11-19 NOTE — Procedures (Signed)
HPI:  82 y/o with hx of status epilepticus and unable to wean from vent  TECHNICAL SUMMARY:  A multichannel referential and bipolar montage EEG using the standard international 10-20 system was performed on the patient described as minimally responsive.  Most of the recording consists of the patient in stage II sleep.  During the rare portion in which the patient was briefly awake, 7-8 Hz activity can be seen in the posterior head regions bilaterally.  ACTIVATION:  Stepwise photic stimulation and hyperventilation are not performed.  EPILEPTIFORM ACTIVITY:  Occasional, non-frequent sharp wave discharges are seen out of the left mid temporal region.  SLEEP:  As above, most of the recording is spent in stage II sleep architecture  IMPRESSION:  This is an abnormal EEG demonstrating occasional, although infrequent, discharges arising out of the left mid temporal region.  There were no electrographic seizures.  There was mild diffuse slowing of electrocerebral activity.  Most of this recording was spent in sleep architecture.

## 2015-11-19 NOTE — Progress Notes (Signed)
   11/19/15 1123  Clinical Encounter Type  Visited With Family  Visit Type Initial;Critical Care  Spiritual Encounters  Spiritual Needs Emotional   Chaplain met with patient's son. Patient and son have gone through a long few months, but the son indicated that he is holding up well. Chaplain facilitated life review, offered support, and introduced spiritual care services to the son. Spiritual care services are available as needed.   Jeri Lager, Chaplain 11/19/2015 11:25 AM

## 2015-11-20 ENCOUNTER — Inpatient Hospital Stay (HOSPITAL_COMMUNITY): Payer: Medicare Other

## 2015-11-20 DIAGNOSIS — G40301 Generalized idiopathic epilepsy and epileptic syndromes, not intractable, with status epilepticus: Secondary | ICD-10-CM

## 2015-11-20 LAB — GLUCOSE, CAPILLARY
GLUCOSE-CAPILLARY: 113 mg/dL — AB (ref 65–99)
GLUCOSE-CAPILLARY: 116 mg/dL — AB (ref 65–99)
Glucose-Capillary: 125 mg/dL — ABNORMAL HIGH (ref 65–99)
Glucose-Capillary: 117 mg/dL — ABNORMAL HIGH (ref 65–99)
Glucose-Capillary: 109 mg/dL — ABNORMAL HIGH (ref 65–99)
Glucose-Capillary: 106 mg/dL — ABNORMAL HIGH (ref 65–99)
Glucose-Capillary: 109 mg/dL — ABNORMAL HIGH (ref 65–99)

## 2015-11-20 LAB — BASIC METABOLIC PANEL
Anion gap: 7 (ref 5–15)
BUN: 24 mg/dL — AB (ref 6–20)
CHLORIDE: 112 mmol/L — AB (ref 101–111)
CO2: 31 mmol/L (ref 22–32)
CREATININE: 0.34 mg/dL — AB (ref 0.44–1.00)
Calcium: 10.2 mg/dL (ref 8.9–10.3)
GFR calc Af Amer: >60 mL/min (ref >60)
GFR calc non Af Amer: >60 mL/min (ref >60)
GLUCOSE: 136 mg/dL — AB (ref 65–99)
POTASSIUM: 3.4 mmol/L — AB (ref 3.5–5.1)
SODIUM: 150 mmol/L — AB (ref 135–145)

## 2015-11-20 LAB — CBC
HEMATOCRIT: 29 % — AB (ref 36.0–46.0)
Hemoglobin: 8.6 g/dL — ABNORMAL LOW (ref 12.0–15.0)
MCH: 29.9 pg (ref 26.0–34.0)
MCHC: 29.7 g/dL — AB (ref 30.0–36.0)
MCV: 100.7 fL — AB (ref 78.0–100.0)
PLATELETS: 268 10*3/uL (ref 150–400)
RBC: 2.88 MIL/uL — ABNORMAL LOW (ref 3.87–5.11)
RDW: 15.2 % (ref 11.5–15.5)
WBC: 6.8 10*3/uL (ref 4.0–10.5)

## 2015-11-20 LAB — PHOSPHORUS: Phosphorus: 3.1 mg/dL (ref 2.5–4.6)

## 2015-11-20 LAB — MAGNESIUM: Magnesium: 1.9 mg/dL (ref 1.7–2.4)

## 2015-11-20 MED ORDER — POTASSIUM CHLORIDE 20 MEQ/15ML (10%) PO SOLN
40.0000 meq | Freq: Once | ORAL | Status: AC
  Administered 2015-11-20: 40 meq
  Filled 2015-11-20: qty 30

## 2015-11-20 MED ORDER — FREE WATER
200.0000 mL | Status: DC
Start: 2015-11-20 — End: 2015-11-21
  Administered 2015-11-20 – 2015-11-21 (×7): 200 mL

## 2015-11-20 MED ORDER — AMLODIPINE BESYLATE 5 MG PO TABS
5.0000 mg | ORAL_TABLET | Freq: Every day | ORAL | Status: DC
  Administered 2015-11-20 – 2015-11-28 (×9): 5 mg via ORAL
  Filled 2015-11-20 (×9): qty 1

## 2015-11-20 MED ORDER — PROPRANOLOL HCL 40 MG PO TABS
40.0000 mg | ORAL_TABLET | Freq: Two times a day (BID) | ORAL | Status: DC
  Administered 2015-11-20 – 2015-11-28 (×18): 40 mg via ORAL
  Filled 2015-11-20 (×28): qty 1

## 2015-11-20 NOTE — Progress Notes (Signed)
PULMONARY / CRITICAL CARE MEDICINE   Name: Kathryn [name] MRN: [id number] DOB: Mar 16, 1931    ADMISSION DATE:  11/04/2015 CONSULTATION DATE:  11/09/15  REFERRING MD:  Neurology / Dr. Nicole Kindred   CHIEF COMPLAINT: Seizure   [name] with PMH of CVA and recent admission for status epilepticus readmitted 3/5 for repeat seizure activity.  Despite AED's, the patient continues to have seizures on continuous EEG.  PCCM called for elective intubation for deep sedation to control seizures.   SUBJECTIVE:   Eyes open but poorly responsive Low gr febrile Good UO BP high  VITAL SIGNS: BP 159/77 mmHg  Pulse 107  Temp(Src) 100.4 F (38 C) (Core (Comment))  Resp 17  Wt 118 lb 13.3 oz (53.9 kg)  SpO2 98%  VENTILATOR SETTINGS: Vent Mode:  [-] PRVC FiO2 (%):  [30 %-40 %] 30 % Set Rate:  [10 bmp] 10 bmp Vt Set:  [420 mL] 420 mL PEEP:  [5 cmH20] 5 cmH20 Plateau Pressure:  [10 cmH20-17 cmH20] 15 cmH20  INTAKE / OUTPUT: I/O last 3 completed shifts: In: 2500 [I.V.:320; NG/GT:1620; IV Piggyback:560] Out: 2805 [Urine:2805]  PHYSICAL EXAMINATION: General:chr ill appearing Neuro: RASS 0, does not follow commands, eyelids flicker to name HEENT: ETT in place Cardiac: regular Chest: no wheeze Abd: soft, non tender Ext: no edema Skin: sores on heels  LABS:  BMET  Recent Labs Lab 11/17/15 0527 11/19/15 0233 11/20/15 0533  NA 146* 148* 150*  K 3.5 3.0* 3.4*  CL 102 108 112*  CO2 30 31 31   BUN 28* 24* 24*  CREATININE 0.44 0.33* 0.34*  GLUCOSE 148* 130* 136*    Electrolytes  Recent Labs Lab 11/16/15 0540 11/17/15 0527 11/19/15 0233 11/20/15 0533  CALCIUM 9.9 10.6* 9.7 10.2  MG 1.5*  --  1.6* 1.9  PHOS  --   --   --  3.1    CBC  Recent Labs Lab 11/20/15 0533  WBC 6.8  HGB 8.6*  HCT 29.0*  PLT 268    Coag's No results for input(s): APTT, INR in the last 168 hours.  Sepsis Markers No results for input(s): LATICACIDVEN, PROCALCITON, O2SATVEN in the last 168  hours.  ABG No results for input(s): PHART, PCO2ART, PO2ART in the last 168 hours.  Liver Enzymes  Recent Labs Lab 11/13/15 1010  AST 41  ALT 30  ALKPHOS 78  BILITOT 0.4  ALBUMIN 1.8*    Cardiac Enzymes No results for input(s): TROPONINI, PROBNP in the last 168 hours.  Glucose  Recent Labs Lab 11/19/15 0812 11/19/15 1114 11/19/15 1533 11/19/15 1959 11/19/15 2337 11/20/15 0328  GLUCAP 118* 135* 132* 119* 113* 116*    Imaging Dg Chest Port 1 View  11/20/2015  CLINICAL DATA:  Acute respiratory failure, history previous CVA. EXAM: PORTABLE CHEST 1 VIEW COMPARISON:  Portable chest x-ray of November 16, 2015 FINDINGS: The lungs are mildly hyperinflated. There is no focal infiltrate. The interstitial markings are coarse predominantly on the right. The heart and pulmonary vascularity are normal. The mediastinum is normal in width. There is no pleural effusion. The endotracheal tube tip lies approximately 4.5 cm above the carina. The esophagogastric tube tip projects below the inferior margin of the image. IMPRESSION: No evidence of pneumonia nor CHF. Mild interstitial prominence especially on the right is stable and may reflect underlying chronic lung disease. The support tubes are in reasonable position. Electronically Signed   By: David  Martinique M.D.   On: 11/20/2015 07:59     STUDIES:  3/10 Continuous EEG >> Left hemispheric PLEDS were present with maximum negativity in the left posterotemporal/parietal cortex, although less developed, and less organized essentially unchanged compare to day 3 of recording  There were no subclinical seizures 3/13 continueous EEG >> burst suppression pattern but no seizure focus 3/20 EEG >>occasional, although infrequent, discharges arising out of the left mid temporal region - no electrographic seizures.   CULTURES: HIV 3/6 >> negative  BCx2 3/6 >> negative UC 3/6 >> enterococcus >> pan sensitive   ANTIBIOTICS: Aztreonam 3/6 >> 3/6   Fortaz 3/6 >> 3/8 Vanco 3/6 >> 3/12  SIGNIFICANT EVENTS: 3/04  Discharged after episode of status epilepticus 3/05  Readmitted  LINES/TUBES: ETT 3/10 >> Lt IJ CVL 3/11 >>  DISCUSSION:   ASSESSMENT / PLAN:   NEUROLOGIC A:   Acute Encephalopathy in setting of recurrent Status Epilepticus, seizures apparently resolved Hx CVA, Parkinson's Disease Continuous EEG discontinued on 3/15 p.m. Versed gtt off 3/15, Propofol off 3/14  P:   RASS goal: 0 Vimpat, Keppra, Dilantin dose adjustment per neurology plans. Being decreased.  Fentanyl when necessary while intubated Foot drop boots Continue to discuss goals of care with family, particularly with regard to whether she would be reintubated if we reach the point of extubation once more awake   PULMONARY A: OSA - on CPAP Hx Difficult Airway  Compromised airway in setting of seizures P:   SBTs ok, but no extubation until goals clarified   CARDIOVASCULAR A:  Hx Hypertension  Shock, resolved, suspect related to medication / sedation although note has had enterococcal UTI DNR  P:  Resume amlodipin, inderal -home meds  RENAL A:   Hypervolemia. Hypokalemia Hypernatremia P:    lasix on hold for now Replace electrolytes as indicated Add free water Follow BMP  GASTROINTESTINAL A:   Nutrition. P:   Tube feeds Protonix for SUP  HEMATOLOGIC A:   Anemia of critical illness and chronic disease P:  Trend CBC  Lovenox for DVT prophylaxis   INFECTIOUS A:   Enterococcus UTI >> multiple Abx allergies, Completed 7 days vanco 3/12 Fever P:   Follow off abx for now  ENDOCRINE A:   No acute issues  P:   Monitor glucose on BMP   Family: updated her son at bedside 3/17, no family this week    The patient is critically ill with multiple organ systems failure and requires high complexity decision making for assessment and support, frequent evaluation and titration of therapies, application of advanced monitoring  technologies and extensive interpretation of multiple databases. Critical Care Time devoted to patient care services described in this note independent of APP time is 31 minutes.   Kara Mead MD. Shade Flood. [facility] Pager [phone] If no response call [phone]   11/20/2015     11/20/2015, 9:19 AM

## 2015-11-20 NOTE — Progress Notes (Signed)
Subjective: Intubated. No further clinical seizure. Opens eyes to voice but not following commands.   Exam: Filed Vitals:   11/20/15 1000 11/20/15 1100  BP: 161/80 159/77  Pulse: 106 87  Temp: 100.6 F (38.1 C) 100.6 F (38.1 C)  Resp: 21 16       Gen: In bed, NAD MS: follows no commands. Opens eyes to voice CN: PERRLA, EOMI, breathing over vent, blinks to threat.  Motor: no significant spontaneous or purposeful movement.  Sensory: winces to noxious stimuli in all 4 extremities.    Pertinent Labs: none  Etta Quill PA-C [facility] [phone]  Impression: 82 y.o. female patient with recent seizures EEG from yesterday demonstrates occasional, although infrequent, discharges arising out of the left mid temporal region. There were no electrographic seizures. There was mild diffuse slowing of electrocerebral activity.   A/R: Dilantin has been discontinued. Continue Vimpat and Keppra at current dosages.        11/20/2015, 11:51 AM

## 2015-11-21 ENCOUNTER — Inpatient Hospital Stay (HOSPITAL_COMMUNITY): Payer: Medicare Other

## 2015-11-21 DIAGNOSIS — G20 Parkinson's disease: Secondary | ICD-10-CM

## 2015-11-21 DIAGNOSIS — N390 Urinary tract infection, site not specified: Secondary | ICD-10-CM

## 2015-11-21 LAB — BASIC METABOLIC PANEL
ANION GAP: 5 (ref 5–15)
BUN: 23 mg/dL — AB (ref 6–20)
CHLORIDE: 107 mmol/L (ref 101–111)
CO2: 29 mmol/L (ref 22–32)
Calcium: 9.8 mg/dL (ref 8.9–10.3)
Creatinine, Ser: 0.32 mg/dL — ABNORMAL LOW (ref 0.44–1.00)
GFR calc Af Amer: >60 mL/min (ref >60)
GFR calc non Af Amer: >60 mL/min (ref >60)
GLUCOSE: 123 mg/dL — AB (ref 65–99)
POTASSIUM: 3 mmol/L — AB (ref 3.5–5.1)
Sodium: 141 mmol/L (ref 135–145)

## 2015-11-21 LAB — CBC
HEMATOCRIT: 27.6 % — AB (ref 36.0–46.0)
HEMOGLOBIN: 8.2 g/dL — AB (ref 12.0–15.0)
MCH: 29.8 pg (ref 26.0–34.0)
MCHC: 29.7 g/dL — AB (ref 30.0–36.0)
MCV: 100.4 fL — AB (ref 78.0–100.0)
Platelets: 272 10*3/uL (ref 150–400)
RBC: 2.75 MIL/uL — ABNORMAL LOW (ref 3.87–5.11)
RDW: 15.5 % (ref 11.5–15.5)
WBC: 6 10*3/uL (ref 4.0–10.5)

## 2015-11-21 LAB — GLUCOSE, CAPILLARY
GLUCOSE-CAPILLARY: 115 mg/dL — AB (ref 65–99)
GLUCOSE-CAPILLARY: 110 mg/dL — AB (ref 65–99)
Glucose-Capillary: 100 mg/dL — ABNORMAL HIGH (ref 65–99)
Glucose-Capillary: 108 mg/dL — ABNORMAL HIGH (ref 65–99)
Glucose-Capillary: 113 mg/dL — ABNORMAL HIGH (ref 65–99)

## 2015-11-21 MED ORDER — POTASSIUM CHLORIDE 20 MEQ/15ML (10%) PO SOLN
30.0000 meq | ORAL | Status: AC
  Administered 2015-11-21 (×2): 30 meq
  Filled 2015-11-21 (×2): qty 30

## 2015-11-21 MED ORDER — FREE WATER
200.0000 mL | Freq: Three times a day (TID) | Status: DC
  Administered 2015-11-21 – 2015-11-28 (×21): 200 mL

## 2015-11-21 MED ORDER — LORAZEPAM 2 MG/ML IJ SOLN
1.0000 mg | Freq: Once | INTRAMUSCULAR | Status: AC
  Administered 2015-11-21: 1 mg via INTRAVENOUS

## 2015-11-21 MED ORDER — LORAZEPAM 2 MG/ML IJ SOLN
INTRAMUSCULAR | Status: AC
  Administered 2015-11-21: 1 mg
  Filled 2015-11-21: qty 1

## 2015-11-21 NOTE — Progress Notes (Addendum)
PULMONARY / CRITICAL CARE MEDICINE   Name: Kathryn [name] MRN: [id number] DOB: [birth year]    ADMISSION DATE:  11/04/2015 CONSULTATION DATE:  11/09/15  REFERRING MD:  Neurology / Dr. Nicole Kindred   CHIEF COMPLAINT: Seizure   [name] with PMH of CVA and recent admission for status epilepticus readmitted 3/5 for repeat seizure activity.  Despite AED's, the patient continues to have seizures on continuous EEG.  PCCM called for elective intubation for deep sedation to control seizures.   SUBJECTIVE:  No acute events overnight. Patient did have some rhythmic motion of right upper extremity. RN reports not following commands.  REVIEW OF SYSTEMS:  Unable to obtain given intubation.  VITAL SIGNS: BP 167/80 mmHg  Pulse 97  Temp(Src) 99.1 F (37.3 C) (Core (Comment))  Resp 12  Wt 55.7 kg (122 lb 12.7 oz)  SpO2 98%  VENTILATOR SETTINGS: Vent Mode:  [-] PRVC FiO2 (%):  [30 %] 30 % Set Rate:  [10 bmp] 10 bmp Vt Set:  [420 mL] 420 mL PEEP:  [5 cmH20] 5 cmH20 Pressure Support:  [10 cmH20] 10 cmH20 Plateau Pressure:  [12 cmH20-15 cmH20] 13 cmH20  INTAKE / OUTPUT: I/O last 3 completed shifts: In: 3035 [I.V.:360; NG/GT:2270; IV Piggyback:405] Out: 2625 [Urine:2625]  PHYSICAL EXAMINATION: General: Eyes open. No distress. No family at bedside.  Integument:  Warm & dry. No rash on exposed skin.  HEENT: No scleral injection or icterus. Endotracheal tube in place. . Cardiovascular:  Regular rate. No edema. No appreciable JVD.  Pulmonary:  Good aeration & clear to auscultation bilaterally. Symmetric chest wall rise on ventilator. Abdomen: Soft. Normal bowel sounds. Nondistended.  Neurological: No withdrawal to pain in extremities. Right upper extremity tremor noted. Days seems to be leftward primarily. Does not attend to voice. Does not follow commands.  LABS:  BMET  Recent Labs Lab 11/19/15 0233 11/20/15 0533 11/21/15 0340  NA 148* 150* 141  K 3.0* 3.4* 3.0*  CL 108 112* 107  CO2 31 31  29   BUN 24* 24* 23*  CREATININE 0.33* 0.34* 0.32*  GLUCOSE 130* 136* 123*    Electrolytes  Recent Labs Lab 11/16/15 0540  11/19/15 0233 11/20/15 0533 11/21/15 0340  CALCIUM 9.9  < > 9.7 10.2 9.8  MG 1.5*  --  1.6* 1.9  --   PHOS  --   --   --  3.1  --   < > = values in this interval not displayed.  CBC  Recent Labs Lab 11/20/15 0533 11/21/15 0340  WBC 6.8 6.0  HGB 8.6* 8.2*  HCT 29.0* 27.6*  PLT 268 272    Coag's No results for input(s): APTT, INR in the last 168 hours.  Sepsis Markers No results for input(s): LATICACIDVEN, PROCALCITON, O2SATVEN in the last 168 hours.  ABG No results for input(s): PHART, PCO2ART, PO2ART in the last 168 hours.  Liver Enzymes No results for input(s): AST, ALT, ALKPHOS, BILITOT, ALBUMIN in the last 168 hours.  Cardiac Enzymes No results for input(s): TROPONINI, PROBNP in the last 168 hours.  Glucose  Recent Labs Lab 11/20/15 1117 11/20/15 1514 11/20/15 1955 11/20/15 2305 11/21/15 0435 11/21/15 0753  GLUCAP 117* 109* 106* 109* 100* 115*    Imaging Dg Chest Port 1 View  11/21/2015  CLINICAL DATA:  Respiratory failure. EXAM: PORTABLE CHEST 1 VIEW COMPARISON:  11/20/2015. FINDINGS: Endotracheal tube and NG tube in stable position. Mediastinum hilar structures normal. Mild bibasilar subsegmental atelectasis. No pleural effusion or pneumothorax. IMPRESSION: 1. Lines and  tubes in stable position. 2. Mild bibasilar subsegmental atelectasis. No acute cardiopulmonary disease. Electronically Signed   By: Marcello Moores  Register   On: 11/21/2015 07:38     STUDIES:  3/10 Continuous EEG >> Left hemispheric PLEDS were present with maximum negativity in the left posterotemporal/parietal cortex, although less developed, and less organized essentially unchanged compare to day 3 of recording  There were no subclinical seizures 3/13 continueous EEG >> burst suppression pattern but no seizure focus 3/20 EEG >>occasional, although infrequent,  discharges arising out of the left mid temporal region - no electrographic seizures.  Port CXR 3/22:  Minimal basilar atelectasis. Endotracheal tube in good position.  MICROBIOLOGY: MRSA PCR 3/13:  Negative Blood Ctx x2 3/6:  Negative Urine Ctx 3/6:  Enterococcus HIV 3/6:  Negative Influenza PCR 3/6:  Negative   ANTIBIOTICS: Aztreonam 3/6 - 3/6  Fortaz 3/6 - 3/8 Vanco 3/6 - 3/12  SIGNIFICANT EVENTS: 3/04  Discharged after episode of status epilepticus 3/05  Readmitted  LINES/TUBES: OETT 7.5 3/10 >> OGT 3/10>>> Foley 3/5>>> PIV x1 L IJ CVL 3/11 - ?  ASSESSMENT / PLAN:  NEUROLOGIC A:   Acute Encephalopathy - In setting of Status Epilepticus, seizures apparently resolved H/O  CVA & Parkinson's Disease  P:   Management per Neurology RASS goal: 0 On Vimpat & Keprra Fentanyl IV prn Foot drop boots  PULMONARY A: OSA - on CPAP H/O Difficult Airway  Compromised airway in setting of seizures  P:   SBTs ok, but no extubation until goals clarified Continuing full vent support   CARDIOVASCULAR A:  Shock - Resolved. Sepsis from UTI vs medication. H/O Hypertension    P:  Monitor on telemetry Vitals per unit protocol Continuing Inderal & Norvasc - home meds  RENAL A:   Hypokalemia - Replacing. Hypernatremia - Resolved.  P:   Lasix on hold currently KCl 60 mEq VT today Free Water VT q8hr Trending UOP with foley Monitoring electrolytes & renal function daily  GASTROINTESTINAL A:   No acute issues.  P:   Continue Tube Feedings Protonix VT daily  HEMATOLOGIC A:   Anemia - No signs of active bleeding. Secondary to chronic disease.  P:  Trending cell counts daily w/ CBC Lovenox for DVT prophylaxis  SCDs  INFECTIOUS A:   Enterococcus UTI - Multiple Abx allergies. Completed 7 days Vancomycin 3/12.  P:   Monitor for new signs of infection.  ENDOCRINE A:   No acute issues.   P:   Monitor glucose on BMP   FAMILY UPDATES: updated her son at  bedside 3/17. No family at bedside this morning.   TODAY'S SUMMARY:  [name] with recurrent status epilepticus. Patient failed spontaneous breathing trial again this morning. No obvious signs of ongoing seizure activity at this time.  I have spent a total of 32 minutes of critical care time today caring for the patient and reviewing the patient's electronic medical record.  Sonia Baller Ashok Cordia, M.D. [facility] Pulmonary & Critical Care Pager:  [phone] After 3pm or if no response, call [phone]  11/21/2015, 9:12 AM

## 2015-11-21 NOTE — Progress Notes (Signed)
[facility] Electrolyte Replacement Protocol  Patient Name: Kathryn [name] DOB: 12/20/1930 MRN: [id number]  Date of Service  11/21/2015   HPI/Events of Note    Recent Labs Lab 11/16/15 0540 11/17/15 0527 11/19/15 0233 11/20/15 0533 11/21/15 0340  NA 141 146* 148* 150* 141  K 4.2 3.5 3.0* 3.4* 3.0*  CL 103 102 108 112* 107  CO2 30 30 31 31 29   GLUCOSE 156* 148* 130* 136* 123*  BUN 21* 28* 24* 24* 23*  CREATININE 0.41* 0.44 0.33* 0.34* 0.32*  CALCIUM 9.9 10.6* 9.7 10.2 9.8  MG 1.5*  --  1.6* 1.9  --   PHOS  --   --   --  3.1  --     Estimated Creatinine Clearance: 44.1 mL/min (by C-G formula based on Cr of 0.32).  Intake/Output      03/21 0701 - 03/22 0700   I.V. (mL/kg) 220 (3.9)   NG/GT 1640   IV Piggyback 265   Total Intake(mL/kg) 2125 (38.2)   Urine (mL/kg/hr) 1590 (1.2)   Total Output 1590   Net +535        - I/O DETAILED x24h    Total I/O In: 1325 [I.V.:100; NG/GT:1100; IV Piggyback:125] Out: 725 [Urine:725] - I/O THIS SHIFT    ASSESSMENT   eICURN Interventions  K+ replaced using electrolyte protocol. MD informed   ASSESSMENT: MAJOR ELECTROLYTE    Lorene Dy 11/21/2015, 6:01 AM

## 2015-11-21 NOTE — Progress Notes (Signed)
Nutrition Follow-up  INTERVENTION:   Continue Vital AF 1.2 @ 45 ml/hr Provides: 1296 kcal (97% of needs), 81 grams protein, and 876 ml H2O.   NUTRITION DIAGNOSIS:   Inadequate oral intake related to inability to eat as evidenced by NPO status. Ongoing.   GOAL:   Patient will meet greater than or equal to 90% of their needs Met.   MONITOR:   Vent status, Labs, I & O's, TF tolerance  ASSESSMENT:   82 y/o with history of depression, TIA, CVA, Parkinson's disease, seizure d/o, HTN, OSA, hypothyroidism, Polypharmacy. Patient was brought in by EMS from [facility] due to prolonged seizure activity.  Patient is currently intubated on ventilator support MV: 7.5 L/min Temp (24hrs), Avg:99.5 F (37.5 C), Min:98.2 F (36.8 C), Max:100.4 F (38 C)  Labs reviewed: potassium low 3.0 on supplementation CBG's: 100-115 OG tube  Per MD not ready for extubation and will need further plan of care discussions with family.    Diet Order:  Diet NPO time specified  Skin:  Wound (see comment) (Deep tissue injury left heel)  Last BM:  3/19  Height:   Ht Readings from Last 1 Encounters:  10/21/15 5' 3"  (1.6 m)    Weight:   Wt Readings from Last 1 Encounters:  11/21/15 122 lb 12.7 oz (55.7 kg)    Ideal Body Weight:  52.3 kg  BMI:  Body mass index is 21.76 kg/(m^2).  Estimated Nutritional Needs:   Kcal:  1331  Protein:  73-84 g (1.4-1.6 g/kg bw)  Fluid:  > 1.5 L/day  EDUCATION NEEDS:   No education needs identified at this time  [place], [place], [place] Pager [phone] After Hours Pager

## 2015-11-21 NOTE — Progress Notes (Signed)
~   2:30 assessed a tremor/jerking in the patient's right arm and hand. Hit elink button. Clever stated she would inform Dr. Oletta Darter. No new orders given at this time.

## 2015-11-22 ENCOUNTER — Inpatient Hospital Stay (HOSPITAL_COMMUNITY): Payer: Medicare Other

## 2015-11-22 DIAGNOSIS — G9340 Encephalopathy, unspecified: Secondary | ICD-10-CM

## 2015-11-22 LAB — RENAL FUNCTION PANEL
ALBUMIN: 2.3 g/dL — AB (ref 3.5–5.0)
Anion gap: 12 (ref 5–15)
BUN: 23 mg/dL — AB (ref 6–20)
CO2: 25 mmol/L (ref 22–32)
Calcium: 9.9 mg/dL (ref 8.9–10.3)
Chloride: 106 mmol/L (ref 101–111)
Creatinine, Ser: 0.35 mg/dL — ABNORMAL LOW (ref 0.44–1.00)
GFR calc Af Amer: >60 mL/min (ref >60)
Glucose, Bld: 111 mg/dL — ABNORMAL HIGH (ref 65–99)
PHOSPHORUS: 2.6 mg/dL (ref 2.5–4.6)
POTASSIUM: 3.4 mmol/L — AB (ref 3.5–5.1)
Sodium: 143 mmol/L (ref 135–145)

## 2015-11-22 LAB — CBC WITH DIFFERENTIAL/PLATELET
BASOS ABS: 0.1 10*3/uL (ref 0.0–0.1)
Basophils Relative: 1 %
EOS PCT: 5 %
Eosinophils Absolute: 0.3 10*3/uL (ref 0.0–0.7)
HEMATOCRIT: 29.2 % — AB (ref 36.0–46.0)
HEMOGLOBIN: 9.2 g/dL — AB (ref 12.0–15.0)
LYMPHS PCT: 19 %
Lymphs Abs: 1.2 10*3/uL (ref 0.7–4.0)
MCH: 31.5 pg (ref 26.0–34.0)
MCHC: 31.5 g/dL (ref 30.0–36.0)
MCV: 100 fL (ref 78.0–100.0)
Monocytes Absolute: 0.7 10*3/uL (ref 0.1–1.0)
Monocytes Relative: 12 %
NEUTROS ABS: 3.8 10*3/uL (ref 1.7–7.7)
NEUTROS PCT: 63 %
PLATELETS: 244 10*3/uL (ref 150–400)
RBC: 2.92 MIL/uL — AB (ref 3.87–5.11)
RDW: 15.3 % (ref 11.5–15.5)
WBC: 6 10*3/uL (ref 4.0–10.5)

## 2015-11-22 LAB — GLUCOSE, CAPILLARY
GLUCOSE-CAPILLARY: 107 mg/dL — AB (ref 65–99)
GLUCOSE-CAPILLARY: 123 mg/dL — AB (ref 65–99)
GLUCOSE-CAPILLARY: 130 mg/dL — AB (ref 65–99)
GLUCOSE-CAPILLARY: 99 mg/dL (ref 65–99)
Glucose-Capillary: 107 mg/dL — ABNORMAL HIGH (ref 65–99)
Glucose-Capillary: 108 mg/dL — ABNORMAL HIGH (ref 65–99)

## 2015-11-22 LAB — MAGNESIUM: Magnesium: 1.6 mg/dL — ABNORMAL LOW (ref 1.7–2.4)

## 2015-11-22 MED ORDER — MAGNESIUM SULFATE 2 GM/50ML IV SOLN
2.0000 g | Freq: Once | INTRAVENOUS | Status: AC
  Administered 2015-11-22: 2 g via INTRAVENOUS
  Filled 2015-11-22: qty 50

## 2015-11-22 MED ORDER — LORAZEPAM 2 MG/ML IJ SOLN: 1.0000 mg | INTRAMUSCULAR | Status: DC | PRN

## 2015-11-22 MED ORDER — POTASSIUM CHLORIDE 20 MEQ/15ML (10%) PO SOLN
40.0000 meq | Freq: Once | ORAL | Status: AC
  Administered 2015-11-22: 40 meq
  Filled 2015-11-22: qty 30

## 2015-11-22 NOTE — Progress Notes (Signed)
0110 patient hand, arm, face making jerking/twitching movement. Hit elink button. Elink nurse stated she would inform the MD. Lasted approximately 3 minutes. No new orders at this time.

## 2015-11-22 NOTE — Progress Notes (Signed)
EEG completed, results pending. 

## 2015-11-22 NOTE — Progress Notes (Signed)
PULMONARY / CRITICAL CARE MEDICINE   Name: Kathryn [name] MRN: [id number] DOB: 1930/12/30    ADMISSION DATE:  11/04/2015 CONSULTATION DATE:  11/09/15  REFERRING MD:  Neurology / Dr. Nicole Kindred   CHIEF COMPLAINT: Seizure   Brief History:  [name] with PMH of CVA and recent admission for status epilepticus readmitted 3/5 for repeat seizure activity.  Despite AED's, the patient continues to have seizures on continuous EEG.  PCCM called for elective intubation for deep sedation to control seizures.   SUBJECTIVE:  Patient continuing to have some focal seizure activity. Has been given intermittent dose of Ativan.  REVIEW OF SYSTEMS:  Unable to obtain given intubation and altered mentation.  VITAL SIGNS: BP 155/71 mmHg  Pulse 90  Temp(Src) 98.4 F (36.9 C) (Oral)  Resp 15  Ht 5\' 3"  (1.6 m)  Wt 54.8 kg (120 lb 13 oz)  BMI 21.41 kg/m2  SpO2 100%  VENTILATOR SETTINGS: Vent Mode:  [-] PRVC FiO2 (%):  [30 %] 30 % Set Rate:  [10 bmp] 10 bmp Vt Set:  [420 mL] 420 mL PEEP:  [5 cmH20] 5 cmH20 Plateau Pressure:  [10 cmH20-15 cmH20] 12 cmH20  INTAKE / OUTPUT: I/O last 3 completed shifts: In: [id number] [I.V.:360; NG/GT:2670; IV Piggyback:375] Out: 2360 [Urine:2360]  PHYSICAL EXAMINATION: General: Eyes open. No distress. No family at bedside.  Integument:  Warm & dry. No rash on exposed skin.  HEENT: No scleral injection. Endotracheal tube in place. . Cardiovascular:  Regular rate. No edema. No appreciable JVD.  Pulmonary:  Clear bilaterally to auscultation. Symmetric chest wall rise on ventilator. Abdomen: Soft. Normal bowel sounds. Nondistended.  Neurological: Patient again has no withdrawal to pain. Symmetric deep tendon reflexes. Forward gaze. Does not follow commands.  LABS:  BMET  Recent Labs Lab 11/20/15 0533 11/21/15 0340 11/22/15 0540  NA 150* 141 143  K 3.4* 3.0* 3.4*  CL 112* 107 106  CO2 31 29 25   BUN 24* 23* 23*  CREATININE 0.34* 0.32* 0.35*  GLUCOSE 136* 123* 111*     Electrolytes  Recent Labs Lab 11/19/15 0233 11/20/15 0533 11/21/15 0340 11/22/15 0540  CALCIUM 9.7 10.2 9.8 9.9  MG 1.6* 1.9  --  1.6*  PHOS  --  3.1  --  2.6    CBC  Recent Labs Lab 11/20/15 0533 11/21/15 0340 11/22/15 0540  WBC 6.8 6.0 6.0  HGB 8.6* 8.2* 9.2*  HCT 29.0* 27.6* 29.2*  PLT 268 272 244    Coag's No results for input(s): APTT, INR in the last 168 hours.  Sepsis Markers No results for input(s): LATICACIDVEN, PROCALCITON, O2SATVEN in the last 168 hours.  ABG No results for input(s): PHART, PCO2ART, PO2ART in the last 168 hours.  Liver Enzymes  Recent Labs Lab 11/22/15 0540  ALBUMIN 2.3*    Cardiac Enzymes No results for input(s): TROPONINI, PROBNP in the last 168 hours.  Glucose  Recent Labs Lab 11/21/15 0753 11/21/15 1129 11/21/15 1557 11/21/15 1937 11/21/15 2347 11/22/15 0323  GLUCAP 115* 110* 108* 113* 99 107*    Imaging No results found.   STUDIES:  3/10 Continuous EEG >> Left hemispheric PLEDS were present with maximum negativity in the left posterotemporal/parietal cortex, although less developed, and less organized essentially unchanged compare to day 3 of recording. There were no subclinical seizures. 3/13 continueous EEG >> burst suppression pattern but no seizure focus 3/20 EEG >>occasional, although infrequent, discharges arising out of the left mid temporal region - no electrographic seizures.  Port  CXR 3/22:  Minimal basilar atelectasis. Endotracheal tube in good position.  MICROBIOLOGY: MRSA PCR 3/13:  Negative Blood Ctx x2 3/6:  Negative Urine Ctx 3/6:  Enterococcus HIV 3/6:  Negative Influenza PCR 3/6:  Negative   ANTIBIOTICS: Aztreonam 3/6 - 3/6  Fortaz 3/6 - 3/8 Vanco 3/6 - 3/12  SIGNIFICANT EVENTS: 3/04  Discharged after episode of status epilepticus 3/05  Readmitted  LINES/TUBES: OETT 7.5 3/10 >> OGT 3/10>>> Foley 3/5>>> PIV x1 L IJ CVL 3/11 - ?  ASSESSMENT / PLAN:  NEUROLOGIC A:    Acute Encephalopathy - In setting of Status Epilepticus. Status Epilepticus - Continuing to have intermittent focal seizures. Sedation on Ventilator H/O  CVA & Parkinson's Disease  P:   Management per Neurology RASS goal: 0 On Vimpat & Keprra Fentanyl IV prn Foot drop boots Ativan IV prn  PULMONARY A: OSA - on CPAP H/O Difficult Airway  Compromised airway in setting of seizures  P:   SBT as mental status allows Continuing full vent support   CARDIOVASCULAR A:  Shock - Resolved. Sepsis from UTI vs medication. H/O Hypertension    P:  Monitor on telemetry Vitals per unit protocol Continuing Inderal & Norvasc - home meds  RENAL A:   Hypokalemia - Replacing. Hypomagnesemia - Replacing. Hypernatremia - Resolved.  P:   Lasix on hold currently KCl 40 mEq VT today Magnesium Sulfate 2gm IV Free Water VT q8hr Trending UOP with foley Monitoring electrolytes & renal function daily  GASTROINTESTINAL A:   No acute issues.  P:   Continue Tube Feedings Protonix VT daily  HEMATOLOGIC A:   Anemia - Hgb stable. No signs of active bleeding. Secondary to chronic disease.  P:  Trending cell counts daily w/ CBC Lovenox for DVT prophylaxis  SCDs  INFECTIOUS A:   Enterococcus UTI - Multiple Abx allergies. Completed 7 days Vancomycin 3/12.  P:   Monitor for new signs of infection.  ENDOCRINE A:   No acute issues.   P:   Monitor glucose on BMP   FAMILY UPDATES: Son last updated 3/17. No family at bedside this morning.   TODAY'S SUMMARY:  [name] with recurrent status epilepticus. Patient continuing to have intermittent periods of focal seizure activity. I spoke with Dr. Cheral Marker regarding my concerns over her lack of response to painful stimuli. It is possible that she may be having subclinical status epilepticus.  I have spent a total of 34 minutes of critical care time today caring for the patient and reviewing the patient's electronic medical  record.  Sonia Baller Ashok Cordia, M.D. [facility] Pulmonary & Critical Care Pager:  [phone] After 3pm or if no response, call [phone]  11/22/2015, 8:06 AM

## 2015-11-22 NOTE — Progress Notes (Signed)
Subjective: Has had 2 seizures over the previous 24 hours.   Objective: Current vital signs: BP 164/106 mmHg  Pulse 78  Temp(Src) 99.2 F (37.3 C) (Oral)  Resp 14  Ht 5\' 3"  (1.6 m)  Wt 54.8 kg (120 lb 13 oz)  BMI 21.41 kg/m2  SpO2 100% Vital signs in last 24 hours: Temp:  [98.4 F (36.9 C)-99.2 F (37.3 C)] 99.2 F (37.3 C) (03/23 1600) Pulse Rate:  [74-97] 78 (03/23 1930) Resp:  [11-23] 14 (03/23 1930) BP: (124-188)/(50-117) 164/106 mmHg (03/23 1800) SpO2:  [98 %-100 %] 100 % (03/23 1930) FiO2 (%):  [30 %] 30 % (03/23 1930) Weight:  [54.8 kg (120 lb 13 oz)] 54.8 kg (120 lb 13 oz) (03/23 0400)  Intake/Output from previous day: 03/22 0701 - 03/23 0700 In: 1970 [I.V.:240; NG/GT:1480; IV Piggyback:250] Out: [id number] [Urine:1635] Intake/Output this shift:   Nutritional status: Diet NPO time specified  Neurologic Exam: Gen: In bed, NAD, sleeping Ment: Awakens to tactile stimulus. Opens eyes and occasionally directs gaze towards visual stimuli. Attempts to mouth words over the vent but follows no commands.  CN: PERRL, jerky EOM without nystagmus  Motor/Sensory: Minimal movement to noxious stimuli. Grimaces to noxious stimuli in all 4 extremities.   Lab Results: Basic Metabolic Panel:  Recent Labs Lab 11/16/15 0540 11/17/15 0527 11/19/15 0233 11/20/15 0533 11/21/15 0340 11/22/15 0540  NA 141 146* 148* 150* 141 143  K 4.2 3.5 3.0* 3.4* 3.0* 3.4*  CL 103 102 108 112* 107 106  CO2 30 30 31 31 29 25   GLUCOSE 156* 148* 130* 136* 123* 111*  BUN 21* 28* 24* 24* 23* 23*  CREATININE 0.41* 0.44 0.33* 0.34* 0.32* 0.35*  CALCIUM 9.9 10.6* 9.7 10.2 9.8 9.9  MG 1.5*  --  1.6* 1.9  --  1.6*  PHOS  --   --   --  3.1  --  2.6    Liver Function Tests:  Recent Labs Lab 11/22/15 0540  ALBUMIN 2.3*   No results for input(s): LIPASE, AMYLASE in the last 168 hours. No results for input(s): AMMONIA in the last 168 hours.  CBC:  Recent Labs Lab 11/20/15 0533 11/21/15 0340  11/22/15 0540  WBC 6.8 6.0 6.0  NEUTROABS  --   --  3.8  HGB 8.6* 8.2* 9.2*  HCT 29.0* 27.6* 29.2*  MCV 100.7* 100.4* 100.0  PLT 268 272 244    Cardiac Enzymes: No results for input(s): CKTOTAL, CKMB, CKMBINDEX, TROPONINI in the last 168 hours.  Lipid Panel: No results for input(s): CHOL, TRIG, HDL, CHOLHDL, VLDL, LDLCALC in the last 168 hours.  CBG:  Recent Labs Lab 11/21/15 2347 11/22/15 0323 11/22/15 0746 11/22/15 1156 11/22/15 1540  GLUCAP 99 107* 107* 123* 130*    Microbiology: Results for orders placed or performed during the hospital encounter of 11/04/15  Culture, Urine     Status: None   Collection Time: 11/05/15 12:05 AM  Result Value Ref Range Status   Specimen Description Urine  Final   Special Requests NONE  Final   Culture 70,000 COLONIES/ml ENTEROCOCCUS SPECIES  Final   Report Status 11/07/2015 FINAL  Final   Organism ID, Bacteria ENTEROCOCCUS SPECIES  Final      Susceptibility   Enterococcus species - MIC*    AMPICILLIN <=2 SENSITIVE Sensitive     VANCOMYCIN 1 SENSITIVE Sensitive     GENTAMICIN SYNERGY SENSITIVE Sensitive     * 70,000 COLONIES/ml ENTEROCOCCUS SPECIES  Culture, blood (routine x 2)  Status: None   Collection Time: 11/05/15  9:50 AM  Result Value Ref Range Status   Specimen Description BLOOD RIGHT HAND  Final   Special Requests IN PEDIATRIC BOTTLE 1CC  Final   Culture NO GROWTH 5 DAYS  Final   Report Status 11/10/2015 FINAL  Final  Culture, blood (routine x 2)     Status: None   Collection Time: 11/05/15  9:53 AM  Result Value Ref Range Status   Specimen Description BLOOD RIGHT ANTECUBITAL  Final   Special Requests BOTTLES DRAWN AEROBIC AND ANAEROBIC 5CC  Final   Culture NO GROWTH 5 DAYS  Final   Report Status 11/10/2015 FINAL  Final  MRSA PCR Screening     Status: None   Collection Time: 11/12/15  6:16 PM  Result Value Ref Range Status   MRSA by PCR NEGATIVE NEGATIVE Final    Comment:        The GeneXpert MRSA Assay  (FDA approved for NASAL specimens only), is one component of a comprehensive MRSA colonization surveillance program. It is not intended to diagnose MRSA infection nor to guide or monitor treatment for MRSA infections.     Coagulation Studies: No results for input(s): LABPROT, INR in the last 72 hours.  Imaging: Dg Chest Port 1 View  11/21/2015  CLINICAL DATA:  Respiratory failure. EXAM: PORTABLE CHEST 1 VIEW COMPARISON:  11/20/2015. FINDINGS: Endotracheal tube and NG tube in stable position. Mediastinum hilar structures normal. Mild bibasilar subsegmental atelectasis. No pleural effusion or pneumothorax. IMPRESSION: 1. Lines and tubes in stable position. 2. Mild bibasilar subsegmental atelectasis. No acute cardiopulmonary disease. Electronically Signed   By: Marcello Moores  Register   On: 11/21/2015 07:38    Medications:   Current facility-administered medications:  .  0.9 %  sodium chloride infusion, , Intravenous, Continuous, Chesley Mires, MD, Last Rate: 10 mL/hr at 11/19/15 2000 .  amLODipine (NORVASC) tablet 5 mg, 5 mg, Oral, Daily, Kara Mead V, MD, 5 mg at 11/22/15 0920 .  antiseptic oral rinse solution (CORINZ), 7 mL, Mouth Rinse, 10 times per day, Javier Glazier, MD, 7 mL at 11/22/15 1812 .  bisacodyl (DULCOLAX) suppository 10 mg, 10 mg, Rectal, Daily PRN, Donita Brooks, NP, 10 mg at 11/13/15 1027 .  chlorhexidine gluconate (PERIDEX) 0.12 % solution 15 mL, 15 mL, Mouth Rinse, BID, Javier Glazier, MD, 15 mL at 11/22/15 0800 .  enoxaparin (LOVENOX) injection 40 mg, 40 mg, Subcutaneous, QHS, Wynell Balloon, RPH, 40 mg at 11/21/15 2227 .  feeding supplement (VITAL AF 1.2 CAL) liquid 1,000 mL, 1,000 mL, Per Tube, Continuous, Asencion Islam, RD, Last Rate: 45 mL/hr at 11/22/15 1825, 1,000 mL at 11/22/15 1825 .  fentaNYL (SUBLIMAZE) injection 50 mcg, 50 mcg, Intravenous, Q2H PRN, Donita Brooks, NP, 50 mcg at 11/22/15 0304 .  free water 200 mL, 200 mL, Per Tube, 3 times per day,  Javier Glazier, MD, 200 mL at 11/22/15 1400 .  hydrALAZINE (APRESOLINE) injection 10 mg, 10 mg, Intravenous, Q6H PRN, Nishant Dhungel, MD, 10 mg at 11/22/15 0105 .  lacosamide (VIMPAT) 50 mg in sodium chloride 0.9 % 25 mL IVPB, 50 mg, Intravenous, Q12H, Ram Fuller Mandril, MD, 50 mg at 11/22/15 1000 .  levETIRAcetam (KEPPRA) 1,000 mg in sodium chloride 0.9 % 100 mL IVPB, 1,000 mg, Intravenous, Q12H, Ram Fuller Mandril, MD, 1,000 mg at 11/22/15 0919 .  LORazepam (ATIVAN) injection 1-2 mg, 1-2 mg, Intravenous, Q15 min PRN, Javier Glazier, MD .  pantoprazole sodium (PROTONIX) 40 mg/20 mL oral suspension 40 mg, 40 mg, Per Tube, Q24H, Chesley Mires, MD, 40 mg at 11/22/15 0800 .  pneumococcal 23 valent vaccine (PNU-IMMUNE) injection 0.5 mL, 0.5 mL, Intramuscular, Prior to discharge, Velvet Bathe, MD .  propranolol (INDERAL) tablet 40 mg, 40 mg, Oral, BID, Rigoberto Noel, MD, 40 mg at 11/22/15 [id number]   Impression: 82 y.o. female patient with recent seizures. Has had recurrence of seizures with reduced anticonvulsant dosing, with 2 over the past 24 hours. Repeat EEG today (3/23) reveals moderate diffuse slowing of electrocerebral activity, which can be seen in a wide variety of encephalopathic states including those of a toxic, metabolic, or degenerative nature. There were no focal, hemispheric, or lateralizing features.Today's EEG is improved compared to her prior one in that there were no focal discharges arising out of the left mid temporal region. The periods during the tracing in which the technician described tremulousness or body shaking were unaccompanied by changes in the background activity.    Recommendations: 1. Continue Keppra at 1000 mg IV BID. 2. Increase Vimpat to 100 mg IV BID.    Kerney Elbe, MD 11/22/2015, 8:36 PM

## 2015-11-22 NOTE — Procedures (Signed)
HPI:  82 y/o with seizures  TECHNICAL SUMMARY:  A multichannel referential and bipolar montage EEG using the standard international 10-20 system was performed on the patient described as "tremulous" and experiencing "body tremor" per technician.  The dominant background activity consists of 6 hertz activity seen most prominantly over the posterior head region.  Slower 3 hertz activity can be seen overriding.  The backgound activity is nonreactive to eye opening and closing procedures.    ACTIVATION:  Stepwise photic stimulation and hyperventilation are not performed  EPILEPTIFORM ACTIVITY:  There were no spikes, sharp waves or paroxysmal activity.  The periods of the tracing in which the technician describes that the patient is tremulous or that her body is shaking do not correlate with any changes in the background activity.  SLEEP:  No sleep  IMPRESSION:  This is an abnormal EEG demonstrating a moderate diffuse slowing of electrocerebral activity.  This can be seen in a wide variety of encephalopathic state including those of a toxic, metabolic, or degenerative nature.  There were no focal, hemispheric, or lateralizing features.  This EEG is improved compared to her prior one in that there were no focal discharges arising out of the left mid temporal region.  As above, the periods during the tracing in which the technician described tremulousness or body shaking were unaccompanied by changes in the background activity.  Correlate clinically.

## 2015-11-23 LAB — RENAL FUNCTION PANEL
ANION GAP: 10 (ref 5–15)
Albumin: 2.6 g/dL — ABNORMAL LOW (ref 3.5–5.0)
BUN: 22 mg/dL — ABNORMAL HIGH (ref 6–20)
CALCIUM: 9.9 mg/dL (ref 8.9–10.3)
CHLORIDE: 106 mmol/L (ref 101–111)
CO2: 25 mmol/L (ref 22–32)
Creatinine, Ser: 0.35 mg/dL — ABNORMAL LOW (ref 0.44–1.00)
GFR calc non Af Amer: >60 mL/min (ref >60)
Glucose, Bld: 126 mg/dL — ABNORMAL HIGH (ref 65–99)
Phosphorus: 3.1 mg/dL (ref 2.5–4.6)
Potassium: 3.3 mmol/L — ABNORMAL LOW (ref 3.5–5.1)
SODIUM: 141 mmol/L (ref 135–145)

## 2015-11-23 LAB — CBC WITH DIFFERENTIAL/PLATELET
Basophils Absolute: 0 10*3/uL (ref 0.0–0.1)
Basophils Relative: 1 %
Eosinophils Absolute: 0.3 10*3/uL (ref 0.0–0.7)
Eosinophils Relative: 4 %
HCT: 31.3 % — ABNORMAL LOW (ref 36.0–46.0)
Hemoglobin: 9.8 g/dL — ABNORMAL LOW (ref 12.0–15.0)
Lymphocytes Relative: 15 %
Lymphs Abs: 1.2 10*3/uL (ref 0.7–4.0)
MCH: 30.8 pg (ref 26.0–34.0)
MCHC: 31.3 g/dL (ref 30.0–36.0)
MCV: 98.4 fL (ref 78.0–100.0)
Monocytes Absolute: 0.9 10*3/uL (ref 0.1–1.0)
Monocytes Relative: 11 %
Neutro Abs: 5.6 10*3/uL (ref 1.7–7.7)
Neutrophils Relative %: 69 %
Platelets: 273 10*3/uL (ref 150–400)
RBC: 3.18 MIL/uL — ABNORMAL LOW (ref 3.87–5.11)
RDW: 15.3 % (ref 11.5–15.5)
WBC: 8 10*3/uL (ref 4.0–10.5)

## 2015-11-23 LAB — GLUCOSE, CAPILLARY
Glucose-Capillary: 109 mg/dL — ABNORMAL HIGH (ref 65–99)
Glucose-Capillary: 112 mg/dL — ABNORMAL HIGH (ref 65–99)
Glucose-Capillary: 113 mg/dL — ABNORMAL HIGH (ref 65–99)
Glucose-Capillary: 116 mg/dL — ABNORMAL HIGH (ref 65–99)
Glucose-Capillary: 122 mg/dL — ABNORMAL HIGH (ref 65–99)
Glucose-Capillary: 125 mg/dL — ABNORMAL HIGH (ref 65–99)
Glucose-Capillary: 135 mg/dL — ABNORMAL HIGH (ref 65–99)

## 2015-11-23 LAB — MAGNESIUM: Magnesium: 1.8 mg/dL (ref 1.7–2.4)

## 2015-11-23 MED ORDER — POTASSIUM CHLORIDE 20 MEQ/15ML (10%) PO SOLN
20.0000 meq | ORAL | Status: AC
  Administered 2015-11-23 (×2): 20 meq
  Filled 2015-11-23 (×2): qty 15

## 2015-11-23 MED ORDER — LACOSAMIDE 200 MG/20ML IV SOLN
100.0000 mg | Freq: Two times a day (BID) | INTRAVENOUS | Status: DC
  Administered 2015-11-23 – 2015-12-03 (×21): 100 mg via INTRAVENOUS
  Filled 2015-11-23 (×42): qty 10

## 2015-11-23 NOTE — Progress Notes (Addendum)
PULMONARY / CRITICAL CARE MEDICINE   Name: Kathryn [name] MRN: [id number] DOB: [birth year]    ADMISSION DATE:  11/04/2015 CONSULTATION DATE:  11/09/15  REFERRING MD:  Neurology / Dr. Nicole Kindred   CHIEF COMPLAINT: Seizure   Brief History:  [name] with PMH of CVA and recent admission for status epilepticus readmitted 3/5 for repeat seizure activity.  Despite AED's, the patient continues to have seizures on continuous EEG.  PCCM called for elective intubation for deep sedation to control seizures.   SUBJECTIVE:   No seizures noted last 24 hours Afebrile Good urine output Slightly hypertensive  REVIEW OF SYSTEMS:  Unable to obtain given intubation and altered mentation.  VITAL SIGNS: BP 178/91 mmHg  Pulse 88  Temp(Src) 98.5 F (36.9 C) (Axillary)  Resp 17  Ht 5\' 3"  (1.6 m)  Wt 121 lb 4.1 oz (55 kg)  BMI 21.48 kg/m2  SpO2 97%  VENTILATOR SETTINGS: Vent Mode:  [-] PRVC FiO2 (%):  [30 %] 30 % Set Rate:  [10 bmp] 10 bmp Vt Set:  [420 mL] 420 mL PEEP:  [5 cmH20] 5 cmH20 Plateau Pressure:  [11 cmH20-28 cmH20] 14 cmH20  INTAKE / OUTPUT: I/O last 3 completed shifts: In: 3170 [I.V.:230; NG/GT:2535; IV Piggyback:405] Out: 2970 [Urine:2970]  PHYSICAL EXAMINATION: General: Eyes open. No distress. Integument:  Warm & dry. No rash on exposed skin.  HEENT: No scleral injection. Endotracheal tube in place. . Cardiovascular:  Regular rate. No edema. No appreciable JVD.  Pulmonary:  Clear bilaterally to auscultation. Symmetric chest wall rise on ventilator. Abdomen: Soft. Normal bowel sounds. Nondistended.  Neurological:eyes open but does not follow commands Symmetric deep tendon reflexes.   LABS:  BMET  Recent Labs Lab 11/21/15 0340 11/22/15 0540 11/23/15 0450  NA 141 143 141  K 3.0* 3.4* 3.3*  CL 107 106 106  CO2 29 25 25   BUN 23* 23* 22*  CREATININE 0.32* 0.35* 0.35*  GLUCOSE 123* 111* 126*    Electrolytes  Recent Labs Lab 11/20/15 0533 11/21/15 0340 11/22/15 0540  11/23/15 0450  CALCIUM 10.2 9.8 9.9 9.9  MG 1.9  --  1.6* 1.8  PHOS 3.1  --  2.6 3.1    CBC  Recent Labs Lab 11/21/15 0340 11/22/15 0540 11/23/15 0450  WBC 6.0 6.0 8.0  HGB 8.2* 9.2* 9.8*  HCT 27.6* 29.2* 31.3*  PLT 272 244 273    Coag's No results for input(s): APTT, INR in the last 168 hours.  Sepsis Markers No results for input(s): LATICACIDVEN, PROCALCITON, O2SATVEN in the last 168 hours.  ABG No results for input(s): PHART, PCO2ART, PO2ART in the last 168 hours.  Liver Enzymes  Recent Labs Lab 11/22/15 0540 11/23/15 0450  ALBUMIN 2.3* 2.6*    Cardiac Enzymes No results for input(s): TROPONINI, PROBNP in the last 168 hours.  Glucose  Recent Labs Lab 11/22/15 1952 11/22/15 2338 11/23/15 0409 11/23/15 0734 11/23/15 1214 11/23/15 1510  GLUCAP 108* 112* 116* 135* 125* 122*    Imaging No results found.   STUDIES:  3/10 Continuous EEG >> Left hemispheric PLEDS were present with maximum negativity in the left posterotemporal/parietal cortex, although less developed, and less organized essentially unchanged compare to day 3 of recording. There were no subclinical seizures. 3/13 continueous EEG >> burst suppression pattern but no seizure focus 3/20 EEG >>occasional, although infrequent, discharges arising out of the left mid temporal region - no electrographic seizures.  3/23 EEG >> no seizures  MICROBIOLOGY: MRSA PCR 3/13:  Negative Blood  Ctx x2 3/6:  Negative Urine Ctx 3/6:  Enterococcus HIV 3/6:  Negative Influenza PCR 3/6:  Negative   ANTIBIOTICS: Aztreonam 3/6 - 3/6  Fortaz 3/6 - 3/8 Vanco 3/6 - 3/12  SIGNIFICANT EVENTS: 3/04  Discharged after episode of status epilepticus 3/05  Readmitted  LINES/TUBES: OETT 7.5 3/10 >> OGT 3/10>>> Foley 3/5>>> PIV x1 L IJ CVL 3/11 - ?  ASSESSMENT / PLAN:  NEUROLOGIC A:   Acute Encephalopathy - In setting of Status Epilepticus. Status Epilepticus - Continuing to have intermittent focal  seizures. Sedation on Ventilator H/O  CVA & Parkinson's Disease  P:   Management per Neurology RASS goal: 0 On Vimpat & Keprra Fentanyl IV prn Foot drop boots Ativan IV prn  PULMONARY A: OSA - on CPAP H/O Difficult Airway  Compromised airway in setting of seizures  P:   SBT as mental status allows   CARDIOVASCULAR A:  Shock - Resolved. Sepsis from UTI vs medication. H/O Hypertension    P:  Monitor on telemetry Vitals per unit protocol Continuing Inderal & Norvasc - home meds  RENAL A:   Hypokalemia -  Hypomagnesemia  Hypernatremia - Resolved.  P:   Lasix on hold  Replete electrolytes as needed Trending UOP with foley Monitoring electrolytes & renal function daily  GASTROINTESTINAL A:   No acute issues.  P:   Continue Tube Feedings Protonix VT daily  HEMATOLOGIC A:   Anemia - Hgb stable. No signs of active bleeding. Secondary to chronic disease.  P:  Trending cell counts daily w/ CBC Lovenox for DVT prophylaxis  SCDs  INFECTIOUS A:   Enterococcus UTI - Multiple Abx allergies. Completed 7 days Vancomycin 3/12.  P:   Monitor for new signs of infection.  ENDOCRINE A:   No acute issues.   P:   Monitor glucose on BMP   FAMILY UPDATES: Daughter in law 3/24   TODAY'S SUMMARY:  [name] with recurrent status epilepticus. Patient continuing to have intermittent periods of focal seizure activity last 3/22. Neurological prognosis is guarded given recurrent seizures . Of note she was discharged to rehabilitation for barely one day before being readmitted with seizures. I discussed with her daughter-in-law (who is a Press photographer) that if we make no progress over the weekend, then we should consider a one-way extubation next week. She will discuss with her son. They would like a clear neurological prognosis and I have encouraged him to set up a meeting with the neurologists    The patient is critically ill with multiple organ systems failure and  requires high complexity decision making for assessment and support, frequent evaluation and titration of therapies, application of advanced monitoring technologies and extensive interpretation of multiple databases. Critical Care Time devoted to patient care services described in this note independent of APP time is 35 minutes.   Kara Mead MD. [facility]. [facility] Pager [phone] If no response call [phone]   11/23/2015     11/23/2015, 3:53 PM

## 2015-11-23 NOTE — Progress Notes (Signed)
[facility] ADULT ICU REPLACEMENT PROTOCOL FOR AM LAB REPLACEMENT ONLY  The patient does apply for the [facility] Adult ICU Electrolyte Replacment Protocol based on the criteria listed below:   1. Is GFR >/= 40 ml/min? Yes.    Patient's GFR today is >60 2. Is urine output >/= 0.5 ml/kg/hr for the last 6 hours? Yes.   Patient's UOP is 2.58 ml/kg/hr 3. Is BUN < 60 mg/dL? Yes.    Patient's BUN today is 22 4. Abnormal electrolyte(s): K - 3.3 5. Ordered repletion with: PER PROTOCOL 6. If a panic level lab has been reported, has the CCM MD in charge been notified? Yes.  .   Physician:  Dr. Arcelia Jew 11/23/2015 6:10 AM

## 2015-11-23 NOTE — Progress Notes (Signed)
Subjective: Subjectively with subtle improvement today relative to yesterday per nursing. No further seizures.  Objective: Current vital signs: BP 151/80 mmHg  Pulse 91  Temp(Src) 97.9 F (36.6 C) (Axillary)  Resp 11  Ht 5\' 3"  (1.6 m)  Wt 55 kg (121 lb 4.1 oz)  BMI 21.48 kg/m2  SpO2 100% Vital signs in last 24 hours: Temp:  [97.2 F (36.2 C)-98.8 F (37.1 C)] 97.9 F (36.6 C) (03/24 1600) Pulse Rate:  [73-110] 91 (03/24 1934) Resp:  [11-24] 11 (03/24 1934) BP: (121-200)/(54-105) 151/80 mmHg (03/24 1900) SpO2:  [97 %-100 %] 100 % (03/24 1934) FiO2 (%):  [30 %] 30 % (03/24 1934) Weight:  [55 kg (121 lb 4.1 oz)] 55 kg (121 lb 4.1 oz) (03/24 0343)  Intake/Output from previous day: 03/23 0701 - 03/24 0700 In: 1985 [I.V.:110; NG/GT:1595; IV Piggyback:280] Out: 1920 [Urine:1920] Intake/Output this shift:   Nutritional status: Diet NPO time specified  Neurologic Exam: Gen: In bed, NAD, awake Ment: Tracks examiner toward the left as he moves into her left visual field from her right side to her left side. Attempts to mouth words over the vent but follows no commands. Does not appear to recognize family members. Dense right hemineglect CN: Jerky EOM without nystagmus  Motor/Sensory: Slight movement to noxious stimuli on left. No spontaneous movement of RUE or RLE.  Lab Results: Basic Metabolic Panel:  Recent Labs Lab 11/19/15 0233 11/20/15 0533 11/21/15 0340 11/22/15 0540 11/23/15 0450  NA 148* 150* 141 143 141  K 3.0* 3.4* 3.0* 3.4* 3.3*  CL 108 112* 107 106 106  CO2 31 31 29 25 25   GLUCOSE 130* 136* 123* 111* 126*  BUN 24* 24* 23* 23* 22*  CREATININE 0.33* 0.34* 0.32* 0.35* 0.35*  CALCIUM 9.7 10.2 9.8 9.9 9.9  MG 1.6* 1.9  --  1.6* 1.8  PHOS  --  3.1  --  2.6 3.1    Liver Function Tests:  Recent Labs Lab 11/22/15 0540 11/23/15 0450  ALBUMIN 2.3* 2.6*   No results for input(s): LIPASE, AMYLASE in the last 168 hours. No results for input(s): AMMONIA in  the last 168 hours.  CBC:  Recent Labs Lab 11/20/15 0533 11/21/15 0340 11/22/15 0540 11/23/15 0450  WBC 6.8 6.0 6.0 8.0  NEUTROABS  --   --  3.8 5.6  HGB 8.6* 8.2* 9.2* 9.8*  HCT 29.0* 27.6* 29.2* 31.3*  MCV 100.7* 100.4* 100.0 98.4  PLT 268 272 244 273    Cardiac Enzymes: No results for input(s): CKTOTAL, CKMB, CKMBINDEX, TROPONINI in the last 168 hours.  Lipid Panel: No results for input(s): CHOL, TRIG, HDL, CHOLHDL, VLDL, LDLCALC in the last 168 hours.  CBG:  Recent Labs Lab 11/23/15 0409 11/23/15 0734 11/23/15 1214 11/23/15 1510 11/23/15 1921  GLUCAP 116* 135* 125* 122* 109*    Microbiology: Results for orders placed or performed during the hospital encounter of 11/04/15  Culture, Urine     Status: None   Collection Time: 11/05/15 12:05 AM  Result Value Ref Range Status   Specimen Description Urine  Final   Special Requests NONE  Final   Culture 70,000 COLONIES/ml ENTEROCOCCUS SPECIES  Final   Report Status 11/07/2015 FINAL  Final   Organism ID, Bacteria ENTEROCOCCUS SPECIES  Final      Susceptibility   Enterococcus species - MIC*    AMPICILLIN <=2 SENSITIVE Sensitive     VANCOMYCIN 1 SENSITIVE Sensitive     GENTAMICIN SYNERGY SENSITIVE Sensitive     *  70,000 COLONIES/ml ENTEROCOCCUS SPECIES  Culture, blood (routine x 2)     Status: None   Collection Time: 11/05/15  9:50 AM  Result Value Ref Range Status   Specimen Description BLOOD RIGHT HAND  Final   Special Requests IN PEDIATRIC BOTTLE 1CC  Final   Culture NO GROWTH 5 DAYS  Final   Report Status 11/10/2015 FINAL  Final  Culture, blood (routine x 2)     Status: None   Collection Time: 11/05/15  9:53 AM  Result Value Ref Range Status   Specimen Description BLOOD RIGHT ANTECUBITAL  Final   Special Requests BOTTLES DRAWN AEROBIC AND ANAEROBIC 5CC  Final   Culture NO GROWTH 5 DAYS  Final   Report Status 11/10/2015 FINAL  Final  MRSA PCR Screening     Status: None   Collection Time: 11/12/15  6:16  PM  Result Value Ref Range Status   MRSA by PCR NEGATIVE NEGATIVE Final    Comment:        The GeneXpert MRSA Assay (FDA approved for NASAL specimens only), is one component of a comprehensive MRSA colonization surveillance program. It is not intended to diagnose MRSA infection nor to guide or monitor treatment for MRSA infections.     Coagulation Studies: No results for input(s): LABPROT, INR in the last 72 hours.  Imaging: No results found.  Medications:   Current facility-administered medications:  .  0.9 %  sodium chloride infusion, , Intravenous, Continuous, Chesley Mires, MD, Last Rate: 10 mL/hr at 11/19/15 2000 .  amLODipine (NORVASC) tablet 5 mg, 5 mg, Oral, Daily, Kara Mead V, MD, 5 mg at 11/23/15 0955 .  antiseptic oral rinse solution (CORINZ), 7 mL, Mouth Rinse, 10 times per day, Javier Glazier, MD, 7 mL at 11/23/15 1730 .  bisacodyl (DULCOLAX) suppository 10 mg, 10 mg, Rectal, Daily PRN, Donita Brooks, NP, 10 mg at 11/13/15 1027 .  chlorhexidine gluconate (PERIDEX) 0.12 % solution 15 mL, 15 mL, Mouth Rinse, BID, Javier Glazier, MD, 15 mL at 11/23/15 2028 .  enoxaparin (LOVENOX) injection 40 mg, 40 mg, Subcutaneous, QHS, Wynell Balloon, RPH, 40 mg at 11/22/15 2057 .  feeding supplement (VITAL AF 1.2 CAL) liquid 1,000 mL, 1,000 mL, Per Tube, Continuous, Asencion Islam, RD, Last Rate: 45 mL/hr at 11/23/15 1900, 1,000 mL at 11/23/15 1900 .  fentaNYL (SUBLIMAZE) injection 50 mcg, 50 mcg, Intravenous, Q2H PRN, Donita Brooks, NP, 50 mcg at 11/23/15 2044 .  free water 200 mL, 200 mL, Per Tube, 3 times per day, Javier Glazier, MD, 200 mL at 11/23/15 1400 .  hydrALAZINE (APRESOLINE) injection 10 mg, 10 mg, Intravenous, Q6H PRN, Nishant Dhungel, MD, 10 mg at 11/23/15 0115 .  lacosamide (VIMPAT) 100 mg in sodium chloride 0.9 % 25 mL IVPB, 100 mg, Intravenous, Q12H, Kerney Elbe, MD, 100 mg at 11/23/15 1100 .  levETIRAcetam (KEPPRA) 1,000 mg in sodium chloride 0.9 %  100 mL IVPB, 1,000 mg, Intravenous, Q12H, Ram Fuller Mandril, MD, 1,000 mg at 11/23/15 0955 .  LORazepam (ATIVAN) injection 1-2 mg, 1-2 mg, Intravenous, Q15 min PRN, Javier Glazier, MD .  pantoprazole sodium (PROTONIX) 40 mg/20 mL oral suspension 40 mg, 40 mg, Per Tube, Q24H, Chesley Mires, MD, 40 mg at 11/23/15 0801 .  pneumococcal 23 valent vaccine (PNU-IMMUNE) injection 0.5 mL, 0.5 mL, Intramuscular, Prior to discharge, Velvet Bathe, MD .  propranolol (INDERAL) tablet 40 mg, 40 mg, Oral, BID, Rigoberto Noel, MD, 40 mg  at 11/23/15 0955   Assessment/Plan:  Impression: 82 y.o. female patient with recent seizures. Had recurrence of 2 seizures on Wednesday-Thursday at reduced anticonvulsant dosing. Repeat EEG yesterday (3/23) revealed moderate diffuse slowing of electrocerebral activity, which can be seen in a wide variety of encephalopathic states including those of a toxic, metabolic, or degenerative nature. There were no focal, hemispheric, or lateralizing features.Yesterday's EEG was improved compared to the previous one in that focal discharges arising out of the left mid temporal region were no longer present. The periods during the tracing in which the technician had described tremulousness or body shaking (3/23 EEG) were unaccompanied by changes in the background activity.   Recommendations: 1. Continue Keppra at 1000 mg IV BID. 2. Continue Vimpat at 100 mg IV BID.   3. Discussed her case at length with family at the bedside today, providing education regarding pathophysiology and potential range of neurological outcomes. Her family expressed understanding and agreement with the plan.   A total of 50 minutes were expended in the assessment and management of the patient, including family education, which comprised greater than 50% of total time.    Kerney Elbe, MD 11/23/2015, 9:03 PM

## 2015-11-24 LAB — BASIC METABOLIC PANEL
ANION GAP: 8 (ref 5–15)
BUN: 26 mg/dL — AB (ref 6–20)
CHLORIDE: 110 mmol/L (ref 101–111)
CO2: 24 mmol/L (ref 22–32)
Calcium: 10 mg/dL (ref 8.9–10.3)
Creatinine, Ser: 0.32 mg/dL — ABNORMAL LOW (ref 0.44–1.00)
GFR calc Af Amer: >60 mL/min (ref >60)
Glucose, Bld: 126 mg/dL — ABNORMAL HIGH (ref 65–99)
POTASSIUM: 4.1 mmol/L (ref 3.5–5.1)
Sodium: 142 mmol/L (ref 135–145)

## 2015-11-24 LAB — CBC WITH DIFFERENTIAL/PLATELET
Basophils Absolute: 0 10*3/uL (ref 0.0–0.1)
Basophils Relative: 1 %
EOS ABS: 0.3 10*3/uL (ref 0.0–0.7)
EOS PCT: 5 %
HCT: 33 % — ABNORMAL LOW (ref 36.0–46.0)
Hemoglobin: 10.7 g/dL — ABNORMAL LOW (ref 12.0–15.0)
LYMPHS ABS: 1.2 10*3/uL (ref 0.7–4.0)
Lymphocytes Relative: 18 %
MCH: 32.1 pg (ref 26.0–34.0)
MCHC: 32.4 g/dL (ref 30.0–36.0)
MCV: 99.1 fL (ref 78.0–100.0)
MONO ABS: 1 10*3/uL (ref 0.1–1.0)
Monocytes Relative: 15 %
Neutro Abs: 4.2 10*3/uL (ref 1.7–7.7)
Neutrophils Relative %: 63 %
PLATELETS: 294 10*3/uL (ref 150–400)
RBC: 3.33 MIL/uL — AB (ref 3.87–5.11)
RDW: 15.5 % (ref 11.5–15.5)
WBC: 6.7 10*3/uL (ref 4.0–10.5)

## 2015-11-24 LAB — GLUCOSE, CAPILLARY
GLUCOSE-CAPILLARY: 111 mg/dL — AB (ref 65–99)
GLUCOSE-CAPILLARY: 121 mg/dL — AB (ref 65–99)
GLUCOSE-CAPILLARY: 115 mg/dL — AB (ref 65–99)
GLUCOSE-CAPILLARY: 119 mg/dL — AB (ref 65–99)
GLUCOSE-CAPILLARY: 111 mg/dL — AB (ref 65–99)

## 2015-11-24 LAB — RENAL FUNCTION PANEL
ALBUMIN: 2.5 g/dL — AB (ref 3.5–5.0)
Anion gap: 10 (ref 5–15)
BUN: 23 mg/dL — AB (ref 6–20)
CHLORIDE: 108 mmol/L (ref 101–111)
CO2: 26 mmol/L (ref 22–32)
CREATININE: 0.35 mg/dL — AB (ref 0.44–1.00)
Calcium: 10 mg/dL (ref 8.9–10.3)
GFR calc Af Amer: >60 mL/min (ref >60)
GLUCOSE: 116 mg/dL — AB (ref 65–99)
Phosphorus: 3 mg/dL (ref 2.5–4.6)
Potassium: 3.2 mmol/L — ABNORMAL LOW (ref 3.5–5.1)
Sodium: 144 mmol/L (ref 135–145)

## 2015-11-24 LAB — MAGNESIUM: MAGNESIUM: 1.7 mg/dL (ref 1.7–2.4)

## 2015-11-24 MED ORDER — POTASSIUM CHLORIDE 20 MEQ/15ML (10%) PO SOLN
30.0000 meq | ORAL | Status: AC
  Administered 2015-11-24 (×2): 30 meq
  Filled 2015-11-24 (×2): qty 30

## 2015-11-24 NOTE — Progress Notes (Signed)
[facility] ADULT ICU REPLACEMENT PROTOCOL FOR AM LAB REPLACEMENT ONLY  The patient does apply for the [facility] Adult ICU Electrolyte Replacment Protocol based on the criteria listed below:   1. Is GFR >/= 40 ml/min? Yes.    Patient's GFR today is >60 2. Is urine output >/= 0.5 ml/kg/hr for the last 6 hours? Yes.   Patient's UOP is 0.98 ml/kg/hr 3. Is BUN < 60 mg/dL? Yes.    Patient's BUN today is 23 4. Abnormal electrolyte(s):  K - 3.2 5. Ordered repletion with: PER PROTOCOL 6. If a panic level lab has been reported, has the CCM MD in charge been notified? Yes.  .   Physician:  Dr. Arcelia Jew 11/24/2015 3:45 AM

## 2015-11-24 NOTE — Progress Notes (Signed)
PULMONARY / CRITICAL CARE MEDICINE   Name: Kathryn [name] MRN: [id number] DOB: 1931/08/25    ADMISSION DATE:  11/04/2015 CONSULTATION DATE:  11/09/15  REFERRING MD:  Neurology / Dr. Nicole Kindred   CHIEF COMPLAINT: Seizure   Brief History:  [name] with PMH of CVA and recent admission for status epilepticus readmitted 3/5 for repeat seizure activity.  Despite AED's, the patient continues to have seizures on continuous EEG.  PCCM called for elective intubation for deep sedation to control seizures.   SUBJECTIVE:  No acute events overnight. Patient is becoming slightly more interactive per nursing staff.   REVIEW OF SYSTEMS:  Unable to obtain given intubation and altered mentation.  VITAL SIGNS: BP 136/92 mmHg  Pulse 94  Temp(Src) 99.1 F (37.3 C) (Axillary)  Resp 13  Ht 5\' 3"  (1.6 m)  Wt 56.4 kg (124 lb 5.4 oz)  BMI 22.03 kg/m2  SpO2 100%  VENTILATOR SETTINGS: Vent Mode:  [-] PRVC FiO2 (%):  [30 %] 30 % Set Rate:  [10 bmp] 10 bmp Vt Set:  [420 mL] 420 mL PEEP:  [5 cmH20] 5 cmH20 Plateau Pressure:  [9 cmH20-15 cmH20] 11 cmH20  INTAKE / OUTPUT: I/O last 3 completed shifts: In: 2250 [NG/GT:1820; IV Piggyback:430] Out: 2170 [Urine:2170]  PHYSICAL EXAMINATION: General: Eyes open. continuing no acute distress. Appears comfortable. Integument:  Warm & dry. No rash on exposed skin.  HEENT: No scleral injection. Endotracheal tube in place.  Pupils equal. Cardiovascular:  Regular rate. No edema. No appreciable JVD.  Pulmonary:  Clear bilaterally to auscultation. Symmetric chest wall rise on ventilator. Abdomen: Soft. Normal bowel sounds.  Grossly nondistended.  Neurological: Patient tracks from the left. She is trying to mouth words but is still not following commands. No withdrawal to pain in extremities.  LABS:  BMET  Recent Labs Lab 11/23/15 0450 11/24/15 0224 11/24/15 0704  NA 141 144 142  K 3.3* 3.2* 4.1  CL 106 108 110  CO2 25 26 24   BUN 22* 23* 26*  CREATININE 0.35*  0.35* 0.32*  GLUCOSE 126* 116* 126*    Electrolytes  Recent Labs Lab 11/22/15 0540 11/23/15 0450 11/24/15 0224 11/24/15 0704  CALCIUM 9.9 9.9 10.0 10.0  MG 1.6* 1.8 1.7  --   PHOS 2.6 3.1 3.0  --     CBC  Recent Labs Lab 11/22/15 0540 11/23/15 0450 11/24/15 0224  WBC 6.0 8.0 6.7  HGB 9.2* 9.8* 10.7*  HCT 29.2* 31.3* 33.0*  PLT 244 273 294    Coag's No results for input(s): APTT, INR in the last 168 hours.  Sepsis Markers No results for input(s): LATICACIDVEN, PROCALCITON, O2SATVEN in the last 168 hours.  ABG No results for input(s): PHART, PCO2ART, PO2ART in the last 168 hours.  Liver Enzymes  Recent Labs Lab 11/22/15 0540 11/23/15 0450 11/24/15 0224  ALBUMIN 2.3* 2.6* 2.5*    Cardiac Enzymes No results for input(s): TROPONINI, PROBNP in the last 168 hours.  Glucose  Recent Labs Lab 11/23/15 1214 11/23/15 1510 11/23/15 1921 11/23/15 2312 11/24/15 0317 11/24/15 0735  GLUCAP 125* 122* 109* 113* 111* 121*    Imaging No results found.   STUDIES:  3/10 Continuous EEG >> Left hemispheric PLEDS were present with maximum negativity in the left posterotemporal/parietal cortex, although less developed, and less organized essentially unchanged compare to day 3 of recording. There were no subclinical seizures. 3/13 continueous EEG >> burst suppression pattern but no seizure focus 3/20 EEG >>occasional, although infrequent, discharges arising out of  the left mid temporal region - no electrographic seizures.  3/23 EEG >> no seizures  MICROBIOLOGY: MRSA PCR 3/13:  Negative Blood Ctx x2 3/6:  Negative Urine Ctx 3/6:  Enterococcus HIV 3/6:  Negative Influenza PCR 3/6:  Negative   ANTIBIOTICS: Aztreonam 3/6 - 3/6  Fortaz 3/6 - 3/8 Vanco 3/6 - 3/12  SIGNIFICANT EVENTS: 3/04  Discharged after episode of status epilepticus 3/05  Readmitted  LINES/TUBES: OETT 7.5 3/10 >> OGT 3/10>>> Foley 3/5>>> PIV x1 L IJ CVL 3/11 - ?  ASSESSMENT /  PLAN:  NEUROLOGIC A:   Acute Encephalopathy - In setting of Status Epilepticus. Status Epilepticus - Continuing to have intermittent focal seizures. Sedation on Ventilator H/O  CVA & Parkinson's Disease  P:   Management per Neurology RASS goal: 0 On Vimpat & Keprra Fentanyl IV prn Foot drop boots Ativan IV prn  PULMONARY A: OSA - on CPAP H/O Difficult Airway  Compromised airway in setting of seizures  P:   SBT as mental status allows Continue full vent support for now  CARDIOVASCULAR A:  Shock - Resolved. Sepsis from UTI vs medication. H/O Hypertension    P:  Monitor on telemetry Vitals per unit protocol Continuing Inderal & Norvasc - home meds  RENAL A:   Hypokalemia - Resolved. Hypomagnesemia - Resolved. Hypernatremia - Resolved.  P:   Lasix on hold  Replete electrolytes as needed Trending UOP with foley Monitoring electrolytes & renal function daily  GASTROINTESTINAL A:   No acute issues.  P:   Continue Tube Feedings Protonix VT daily  HEMATOLOGIC A:   Anemia - Hgb stable. No signs of active bleeding. Secondary to chronic disease.  P:  Trending cell counts daily w/ CBC Lovenox for DVT prophylaxis  SCDs  INFECTIOUS A:   Enterococcus UTI - Multiple Abx allergies. Completed 7 days Vancomycin 3/12.  P:   Monitor for new signs of infection.  ENDOCRINE A:   No acute issues.   P:   Monitor glucose on BMP   FAMILY UPDATES: Daughter in law updated 3/24 by Dr. Elsworth Soho.   TODAY'S SUMMARY:  [name] with recurrent status epilepticus. Focal. To seizure activity seemed to have significantly improved. Neurological status is very slowly improving. This is in the main barrier to extubation.   I have spent a total of  32 minutes of critical care time today caring for the patient and reviewing the patient's electronic medical record.  Sonia Baller Ashok Cordia, M.D. [facility] Pulmonary & Critical Care Pager:  [phone] After 3pm or if no  response, call [phone]  11/24/2015, 8:44 AM

## 2015-11-25 LAB — CBC WITH DIFFERENTIAL/PLATELET
Basophils Absolute: 0 10*3/uL (ref 0.0–0.1)
Basophils Relative: 0 %
EOS PCT: 6 %
Eosinophils Absolute: 0.4 10*3/uL (ref 0.0–0.7)
HCT: 29.2 % — ABNORMAL LOW (ref 36.0–46.0)
Hemoglobin: 9.2 g/dL — ABNORMAL LOW (ref 12.0–15.0)
LYMPHS ABS: 1.4 10*3/uL (ref 0.7–4.0)
LYMPHS PCT: 21 %
MCH: 31.1 pg (ref 26.0–34.0)
MCHC: 31.5 g/dL (ref 30.0–36.0)
MCV: 98.6 fL (ref 78.0–100.0)
MONO ABS: 0.8 10*3/uL (ref 0.1–1.0)
Monocytes Relative: 12 %
Neutro Abs: 4 10*3/uL (ref 1.7–7.7)
Neutrophils Relative %: 61 %
Platelets: 302 10*3/uL (ref 150–400)
RBC: 2.96 MIL/uL — ABNORMAL LOW (ref 3.87–5.11)
RDW: 15.7 % — ABNORMAL HIGH (ref 11.5–15.5)
WBC: 6.6 10*3/uL (ref 4.0–10.5)

## 2015-11-25 LAB — RENAL FUNCTION PANEL
ALBUMIN: 2.4 g/dL — AB (ref 3.5–5.0)
ANION GAP: 8 (ref 5–15)
BUN: 18 mg/dL (ref 6–20)
CALCIUM: 9.8 mg/dL (ref 8.9–10.3)
CO2: 26 mmol/L (ref 22–32)
Chloride: 106 mmol/L (ref 101–111)
Creatinine, Ser: 0.31 mg/dL — ABNORMAL LOW (ref 0.44–1.00)
GFR calc Af Amer: >60 mL/min (ref >60)
Glucose, Bld: 132 mg/dL — ABNORMAL HIGH (ref 65–99)
PHOSPHORUS: 2.7 mg/dL (ref 2.5–4.6)
POTASSIUM: 3.3 mmol/L — AB (ref 3.5–5.1)
SODIUM: 140 mmol/L (ref 135–145)

## 2015-11-25 LAB — GLUCOSE, CAPILLARY
GLUCOSE-CAPILLARY: 124 mg/dL — AB (ref 65–99)
GLUCOSE-CAPILLARY: 126 mg/dL — AB (ref 65–99)
GLUCOSE-CAPILLARY: 140 mg/dL — AB (ref 65–99)
Glucose-Capillary: 111 mg/dL — ABNORMAL HIGH (ref 65–99)
Glucose-Capillary: 112 mg/dL — ABNORMAL HIGH (ref 65–99)
Glucose-Capillary: 116 mg/dL — ABNORMAL HIGH (ref 65–99)
Glucose-Capillary: 130 mg/dL — ABNORMAL HIGH (ref 65–99)

## 2015-11-25 LAB — MAGNESIUM: Magnesium: 1.4 mg/dL — ABNORMAL LOW (ref 1.7–2.4)

## 2015-11-25 MED ORDER — MAGNESIUM SULFATE 50 % IJ SOLN
3.0000 g | Freq: Once | INTRAVENOUS | Status: AC
  Administered 2015-11-25: 3 g via INTRAVENOUS
  Filled 2015-11-25: qty 6

## 2015-11-25 MED ORDER — POTASSIUM CHLORIDE 20 MEQ/15ML (10%) PO SOLN
20.0000 meq | ORAL | Status: AC
  Administered 2015-11-25 (×2): 20 meq
  Filled 2015-11-25 (×2): qty 15

## 2015-11-25 NOTE — Progress Notes (Signed)
[facility] ADULT ICU REPLACEMENT PROTOCOL FOR AM LAB REPLACEMENT ONLY  The patient does apply for the [facility] Adult ICU Electrolyte Replacment Protocol based on the criteria listed below:   1. Is GFR >/= 40 ml/min? Yes.    Patient's GFR today is >60 2. Is urine output >/= 0.5 ml/kg/hr for the last 6 hours? Yes.   Patient's UOP is 0.74 ml/kg/hr 3. Is BUN < 60 mg/dL? Yes.    Patient's BUN today is 18 4. Abnormal electrolyte(s):  K - 3.3 5. Ordered repletion with: PER PROTOCOL 6. If a panic level lab has been reported, has the CCM MD in charge been notified? Yes.  .   Physician:  Dr. Arcelia Jew 11/25/2015 4:09 AM

## 2015-11-25 NOTE — Progress Notes (Signed)
PULMONARY / CRITICAL CARE MEDICINE   Name: Kathryn [name] MRN: [id number] DOB: 1930/12/13    ADMISSION DATE:  11/04/2015 CONSULTATION DATE:  11/09/15  REFERRING MD:  Neurology / Dr. Nicole Kindred   CHIEF COMPLAINT: Seizure   Brief History:  [name] with PMH of CVA and recent admission for status epilepticus readmitted 3/5 for repeat seizure activity.  Despite AED's, the patient continues to have seizures on continuous EEG.  PCCM called for elective intubation for deep sedation to control seizures.   SUBJECTIVE:  No acute events overnight. Patient is becoming slightly more interactive per nursing staff.   REVIEW OF SYSTEMS:  Unable to obtain given intubation and altered mentation.  VITAL SIGNS: BP 140/70 mmHg  Pulse 99  Temp(Src) 99.4 F (37.4 C) (Axillary)  Resp 14  Ht 5\' 3"  (1.6 m)  Wt 58.6 kg (129 lb 3 oz)  BMI 22.89 kg/m2  SpO2 99%  VENTILATOR SETTINGS: Vent Mode:  [-] PSV;CPAP FiO2 (%):  [30 %] 30 % Set Rate:  [10 bmp] 10 bmp Vt Set:  [420 mL] 420 mL PEEP:  [5 cmH20] 5 cmH20 Pressure Support:  [10 cmH20] 10 cmH20 Plateau Pressure:  [13 cmH20-14 cmH20] 14 cmH20  INTAKE / OUTPUT: I/O last 3 completed shifts: In: 2585 [NG/GT:2150; IV Piggyback:435] Out: 2075 [Urine:2075]  PHYSICAL EXAMINATION: General: Eyes open. No acute distress. Appears comfortable. Integument:  Warm & dry. No rash on exposed skin.  HEENT: No scleral injection. Endotracheal tube in place.  Pupils equal. Cardiovascular:  Regular rhythm. No edema. No appreciable JVD.  Pulmonary:  Clear bilaterally to auscultation. Symmetric chest wall rise on ventilator. Abdomen: Soft. Normal bowel sounds.  Grossly nondistended.  Neurological: Patient having no spontaneous movement of extremities now. Does not appear to track to voice. Not trying to mouth any words today.  LABS:  BMET  Recent Labs Lab 11/24/15 0224 11/24/15 0704 11/25/15 0249  NA 144 142 140  K 3.2* 4.1 3.3*  CL 108 110 106  CO2 26 24 26   BUN  23* 26* 18  CREATININE 0.35* 0.32* 0.31*  GLUCOSE 116* 126* 132*    Electrolytes  Recent Labs Lab 11/23/15 0450 11/24/15 0224 11/24/15 0704 11/25/15 0249  CALCIUM 9.9 10.0 10.0 9.8  MG 1.8 1.7  --  1.4*  PHOS 3.1 3.0  --  2.7    CBC  Recent Labs Lab 11/23/15 0450 11/24/15 0224 11/25/15 0249  WBC 8.0 6.7 6.6  HGB 9.8* 10.7* 9.2*  HCT 31.3* 33.0* 29.2*  PLT 273 294 302    Coag's No results for input(s): APTT, INR in the last 168 hours.  Sepsis Markers No results for input(s): LATICACIDVEN, PROCALCITON, O2SATVEN in the last 168 hours.  ABG No results for input(s): PHART, PCO2ART, PO2ART in the last 168 hours.  Liver Enzymes  Recent Labs Lab 11/23/15 0450 11/24/15 0224 11/25/15 0249  ALBUMIN 2.6* 2.5* 2.4*    Cardiac Enzymes No results for input(s): TROPONINI, PROBNP in the last 168 hours.  Glucose  Recent Labs Lab 11/24/15 1117 11/24/15 1607 11/24/15 1941 11/25/15 0029 11/25/15 0402 11/25/15 0741  GLUCAP 115* 119* 111* 126* 116* 124*    Imaging No results found.   STUDIES:  3/10 Continuous EEG >> Left hemispheric PLEDS were present with maximum negativity in the left posterotemporal/parietal cortex, although less developed, and less organized essentially unchanged compare to day 3 of recording. There were no subclinical seizures. 3/13 continueous EEG >> burst suppression pattern but no seizure focus 3/20 EEG >>occasional, although  infrequent, discharges arising out of the left mid temporal region - no electrographic seizures.  3/23 EEG >> no seizures  MICROBIOLOGY: MRSA PCR 3/13:  Negative Blood Ctx x2 3/6:  Negative Urine Ctx 3/6:  Enterococcus HIV 3/6:  Negative Influenza PCR 3/6:  Negative   ANTIBIOTICS: Aztreonam 3/6 - 3/6  Fortaz 3/6 - 3/8 Vanco 3/6 - 3/12  SIGNIFICANT EVENTS: 3/04  Discharged after episode of status epilepticus 3/05  Readmitted  LINES/TUBES: OETT 7.5 3/10 >> OGT 3/10>>> Foley 3/5>>> PIV x1 L IJ CVL  3/11 - ?  ASSESSMENT / PLAN:  NEUROLOGIC A:   Acute Encephalopathy - In setting of Status Epilepticus. Status Epilepticus - Continuing to have intermittent focal seizures. Sedation on Ventilator H/O  CVA & Parkinson's Disease  P:   Management per Neurology RASS goal: 0 On Vimpat & Keprra Fentanyl IV prn Foot drop boots Ativan IV prn  PULMONARY A: OSA - on CPAP H/O Difficult Airway  Compromised airway in setting of seizures  P:   SBT as mental status allows Continue full vent support for now  CARDIOVASCULAR A:  Shock - Resolved. Sepsis from UTI vs medication. H/O Hypertension    P:  Monitor on telemetry Vitals per unit protocol Continuing Inderal & Norvasc - home meds  RENAL A:   Hypokalemia - Mild. Replacing. Hypomagnesemia - Replacing. Hypernatremia - Resolved.  P:   Lasix on hold  KCl 17mEq VT Magnesium Sulfate 3gm IV now Trending UOP with foley Monitoring electrolytes & renal function daily  GASTROINTESTINAL A:   No acute issues.  P:   Continue Tube Feedings Protonix VT daily  HEMATOLOGIC A:   Anemia - Hgb stable. No signs of active bleeding. Secondary to chronic disease.  P:  Trending cell counts daily w/ CBC Lovenox for DVT prophylaxis  SCDs  INFECTIOUS A:   Enterococcus UTI - Multiple Abx allergies. Completed 7 days Vancomycin 3/12.  P:   Monitor for new signs of infection.  ENDOCRINE A:   No acute issues.   P:   Monitor glucose on BMP   FAMILY UPDATES: Daughter in law updated 3/24 by Dr. Elsworth Soho.   TODAY'S SUMMARY:  [name] with recurrent status epilepticus. Focal. To seizure activity seemed to have significantly improved. Neurological status now appears stagnant again. This is in the main barrier to extubation. If no improvement tomorrow consider goals of care discussion with family again.  I have spent a total of  31 minutes of critical care time today caring for the patient and reviewing the patient's electronic  medical record.  Sonia Baller Ashok Cordia, M.D. [facility] Pager:  [phone] After 3pm or if no response, call [phone]  11/25/2015, 9:00 AM

## 2015-11-26 ENCOUNTER — Inpatient Hospital Stay (HOSPITAL_COMMUNITY): Payer: Medicare Other

## 2015-11-26 ENCOUNTER — Other Ambulatory Visit (HOSPITAL_COMMUNITY): Payer: 59

## 2015-11-26 LAB — RENAL FUNCTION PANEL
ANION GAP: 10 (ref 5–15)
Albumin: 2.5 g/dL — ABNORMAL LOW (ref 3.5–5.0)
BUN: 18 mg/dL (ref 6–20)
CHLORIDE: 103 mmol/L (ref 101–111)
CO2: 23 mmol/L (ref 22–32)
Calcium: 9.6 mg/dL (ref 8.9–10.3)
Creatinine, Ser: 0.32 mg/dL — ABNORMAL LOW (ref 0.44–1.00)
Glucose, Bld: 122 mg/dL — ABNORMAL HIGH (ref 65–99)
PHOSPHORUS: 3.1 mg/dL (ref 2.5–4.6)
POTASSIUM: 3.6 mmol/L (ref 3.5–5.1)
Sodium: 136 mmol/L (ref 135–145)

## 2015-11-26 LAB — GLUCOSE, CAPILLARY
GLUCOSE-CAPILLARY: 110 mg/dL — AB (ref 65–99)
Glucose-Capillary: 111 mg/dL — ABNORMAL HIGH (ref 65–99)
Glucose-Capillary: 125 mg/dL — ABNORMAL HIGH (ref 65–99)
Glucose-Capillary: 142 mg/dL — ABNORMAL HIGH (ref 65–99)
Glucose-Capillary: 121 mg/dL — ABNORMAL HIGH (ref 65–99)
Glucose-Capillary: 113 mg/dL — ABNORMAL HIGH (ref 65–99)

## 2015-11-26 LAB — LACTIC ACID, PLASMA: Lactic Acid, Venous: 0.7 mmol/L (ref 0.5–2.0)

## 2015-11-26 LAB — CBC WITH DIFFERENTIAL/PLATELET
Basophils Absolute: 0 10*3/uL (ref 0.0–0.1)
Basophils Relative: 0 %
EOS PCT: 7 %
Eosinophils Absolute: 0.4 10*3/uL (ref 0.0–0.7)
HEMATOCRIT: 27.8 % — AB (ref 36.0–46.0)
HEMOGLOBIN: 8.9 g/dL — AB (ref 12.0–15.0)
LYMPHS ABS: 1.1 10*3/uL (ref 0.7–4.0)
LYMPHS PCT: 20 %
MCH: 31 pg (ref 26.0–34.0)
MCHC: 32 g/dL (ref 30.0–36.0)
MCV: 96.9 fL (ref 78.0–100.0)
Monocytes Absolute: 1 10*3/uL (ref 0.1–1.0)
Monocytes Relative: 17 %
NEUTROS ABS: 3.2 10*3/uL (ref 1.7–7.7)
Neutrophils Relative %: 56 %
PLATELETS: 305 10*3/uL (ref 150–400)
RBC: 2.87 MIL/uL — AB (ref 3.87–5.11)
RDW: 15.5 % (ref 11.5–15.5)
WBC: 5.7 10*3/uL (ref 4.0–10.5)

## 2015-11-26 LAB — AMMONIA: AMMONIA: 42 umol/L — AB (ref 9–35)

## 2015-11-26 LAB — HEPATIC FUNCTION PANEL
ALK PHOS: 68 U/L (ref 38–126)
ALT: 32 U/L (ref 14–54)
AST: 35 U/L (ref 15–41)
Albumin: 2.5 g/dL — ABNORMAL LOW (ref 3.5–5.0)
BILIRUBIN DIRECT: 0.1 mg/dL (ref 0.1–0.5)
BILIRUBIN INDIRECT: 0.3 mg/dL (ref 0.3–0.9)
BILIRUBIN TOTAL: 0.4 mg/dL (ref 0.3–1.2)
Total Protein: 6 g/dL — ABNORMAL LOW (ref 6.5–8.1)

## 2015-11-26 LAB — MAGNESIUM: Magnesium: 1.8 mg/dL (ref 1.7–2.4)

## 2015-11-26 NOTE — Progress Notes (Signed)
Dr. Silverio Decamp had family meeting with pt son Scarlette Slice and daughter in law Manuela Schwartz. The son has POA and is aware of the patient's wishes and says he will make a decision about her plan of care within a few days. He plans to review her living will, contact family and then will get back in touch with [place]. We informed them that someone from palliative care would likely be in touch with them based on their responses today about the plan of care.   Palliative Care consult has been entered.   Dejan Angert GARNER

## 2015-11-26 NOTE — Progress Notes (Signed)
EEG completed; results pending.    

## 2015-11-26 NOTE — Progress Notes (Signed)
Interval History:                                                                                                                      Kathryn [name] is an 82 y.o. female patient with recent intractable seizures, currently resolved, on Keppra and Vimpat. She has Parkinson's disease with limb rigidity and resting tremor. Has difficulty in weaning from the ventilator. Prior EEG couple of days ago showed evidence of encephalopathy.    Past Medical History: Past Medical History  Diagnosis Date  . Depression   . Hypertension   . High cholesterol   . Rectal prolapse   . Hx of adenomatous colonic polyps   . Internal hemorrhoids   . Hypothyroidism   . Fecal incontinence   . TIA (transient ischemic attack)   . History of frequent urinary tract infections     recent  . Migraine   . Fatty liver 03/20/13  . Generalized ischemic cerebrovascular disease 10/09/2015  . Stroke [facility]) Sept 1, 2010; Sept 12, 2011    Past Surgical History  Procedure Laterality Date  . Colon surgery  2010, for prolapsed organs after hystecrtomy surgery  . Appendectomy  2008  . Cholecystectomy    . Lumbar laminectomy/decompression microdiscectomy N/A 01/07/2013    Procedure: LUMBAR LAMINECTOMY CENTRAL DECOMPRESSION L4-L5, BILATERAL FORAMENOTOMY L4,L5    ;  Surgeon: Tobi Bastos, MD;  Location: [facility] ORS;  Service: Orthopedics;  Laterality: N/A;  . Back surgery    . Hemorrhoid surgery  09/2008    Archie Endo 12/31/2010  . Abdominal hysterectomy  05/2007    Archie Endo 01/02/2011    Family History: Family History  Problem Relation Age of Onset  . Stroke Father   . Migraines Father   . CVA Father   . Heart attack Father   . Hypertension Maternal Aunt     x3  . Colon cancer Neg Hx   . CVA Mother     Social History:   reports that she has never smoked. She has never used smokeless tobacco. She reports that she does not drink alcohol or use illicit drugs.  Allergies:  Allergies  Allergen Reactions  . Augmentin  [Amoxicillin-Pot Clavulanate] Swelling and Diarrhea    Throat   . Citalopram Swelling    Eyes ears and throat swelling Throat and eyes   . Codeine Anaphylaxis and Shortness Of Breath  . Fish-Derived Products Anaphylaxis  . Hyoscyamine Anaphylaxis and Swelling  . Ibuprofen Swelling    Throat and eyes   . Latex Swelling and Rash    Swelling to troat  . Lipitor [Atorvastatin] Swelling    Muscle aches throat  . Septra [Bactrim] Anaphylaxis  . Shellfish Allergy Anaphylaxis  . Miconazole Rash  . Keflet [Cephalexin] Diarrhea    Upset stomach  . Meloxicam Diarrhea and Nausea And Vomiting  . Verapamil Other (See Comments)    Tachycardia and flushing  . Vicodin [Hydrocodone-Acetaminophen] Other (See Comments)    stroke  . Zoloft [Sertraline Hcl] Swelling  Red spots all over face, swelling of tongue and legs.   . Ciprofloxacin Nausea And Vomiting    Other reaction(s): Abdominal Pain  . Depakene [Valproate Sodium] Hives    All over body   . Isoptin Sr [Verapamil Hcl Er] Cough  . Lisinopril Cough    Fatigue and cough  . Sulfa Antibiotics Rash    Rash and also throat was tight  . Telmisartan-Hctz Cough     Medications:                                                                                                                         Current facility-administered medications:  .  0.9 %  sodium chloride infusion, , Intravenous, Continuous, Chesley Mires, MD, Last Rate: 10 mL/hr at 11/26/15 1800 .  amLODipine (NORVASC) tablet 5 mg, 5 mg, Oral, Daily, Kara Mead V, MD, 5 mg at 11/26/15 1100 .  antiseptic oral rinse solution (CORINZ), 7 mL, Mouth Rinse, 10 times per day, Javier Glazier, MD, 7 mL at 11/26/15 1710 .  bisacodyl (DULCOLAX) suppository 10 mg, 10 mg, Rectal, Daily PRN, Donita Brooks, NP, 10 mg at 11/13/15 1027 .  chlorhexidine gluconate (PERIDEX) 0.12 % solution 15 mL, 15 mL, Mouth Rinse, BID, Javier Glazier, MD, 15 mL at 11/26/15 0804 .  enoxaparin (LOVENOX)  injection 40 mg, 40 mg, Subcutaneous, QHS, Wynell Balloon, RPH, 40 mg at 11/25/15 2147 .  feeding supplement (VITAL AF 1.2 CAL) liquid 1,000 mL, 1,000 mL, Per Tube, Continuous, Asencion Islam, RD, Last Rate: 45 mL/hr at 11/26/15 1800, 1,000 mL at 11/26/15 1800 .  fentaNYL (SUBLIMAZE) injection 50 mcg, 50 mcg, Intravenous, Q2H PRN, Donita Brooks, NP, 50 mcg at 11/26/15 1501 .  free water 200 mL, 200 mL, Per Tube, 3 times per day, Javier Glazier, MD, 200 mL at 11/26/15 1343 .  hydrALAZINE (APRESOLINE) injection 10 mg, 10 mg, Intravenous, Q6H PRN, Nishant Dhungel, MD, 10 mg at 11/25/15 2141 .  lacosamide (VIMPAT) 100 mg in sodium chloride 0.9 % 25 mL IVPB, 100 mg, Intravenous, Q12H, Kerney Elbe, MD, 100 mg at 11/26/15 1306 .  levETIRAcetam (KEPPRA) 1,000 mg in sodium chloride 0.9 % 100 mL IVPB, 1,000 mg, Intravenous, Q12H, Mardi Cannady Fuller Mandril, MD, 1,000 mg at 11/26/15 1100 .  LORazepam (ATIVAN) injection 1-2 mg, 1-2 mg, Intravenous, Q15 min PRN, Javier Glazier, MD .  pantoprazole sodium (PROTONIX) 40 mg/20 mL oral suspension 40 mg, 40 mg, Per Tube, Q24H, Chesley Mires, MD, 40 mg at 11/26/15 0800 .  pneumococcal 23 valent vaccine (PNU-IMMUNE) injection 0.5 mL, 0.5 mL, Intramuscular, Prior to discharge, Velvet Bathe, MD .  propranolol (INDERAL) tablet 40 mg, 40 mg, Oral, BID, Kara Mead V, MD, 40 mg at 11/26/15 1100   Neurologic Examination:  Today's Vitals   11/26/15 1200 11/26/15 1203 11/26/15 1606 11/26/15 1800  BP: 154/74 154/74 122/89 152/67  Pulse: 90 87 87 81  Temp: 98 F (36.7 C)  98.1 F (36.7 C)   TempSrc: Axillary  Axillary   Resp: 14 13 14 16   Height:      Weight:      SpO2: 99% 99% 100% 100%  PainSc:       Patient remains intubated,Follow simple commands such as elevating the left upper extremity. Intermittent resting tremor noted in bilateral upper extremities.  rigidity noted in all extremities   Lab Results: Basic Metabolic Panel:  Recent Labs Lab 11/22/15 0540 11/23/15 0450 11/24/15 0224 11/24/15 0704 11/25/15 0249 11/26/15 0508  NA 143 141 144 142 140 136  K 3.4* 3.3* 3.2* 4.1 3.3* 3.6  CL 106 106 108 110 106 103  CO2 25 25 26 24 26 23   GLUCOSE 111* 126* 116* 126* 132* 122*  BUN 23* 22* 23* 26* 18 18  CREATININE 0.35* 0.35* 0.35* 0.32* 0.31* 0.32*  CALCIUM 9.9 9.9 10.0 10.0 9.8 9.6  MG 1.6* 1.8 1.7  --  1.4* 1.8  PHOS 2.6 3.1 3.0  --  2.7 3.1    Liver Function Tests:  Recent Labs Lab 11/23/15 0450 11/24/15 0224 11/25/15 0249 11/26/15 0508 11/26/15 1510  AST  --   --   --   --  35  ALT  --   --   --   --  32  ALKPHOS  --   --   --   --  68  BILITOT  --   --   --   --  0.4  PROT  --   --   --   --  6.0*  ALBUMIN 2.6* 2.5* 2.4* 2.5* 2.5*   No results for input(s): LIPASE, AMYLASE in the last 168 hours.  Recent Labs Lab 11/26/15 1510  AMMONIA 42*    CBC:  Recent Labs Lab 11/22/15 0540 11/23/15 0450 11/24/15 0224 11/25/15 0249 11/26/15 0508  WBC 6.0 8.0 6.7 6.6 5.7  NEUTROABS 3.8 5.6 4.2 4.0 3.2  HGB 9.2* 9.8* 10.7* 9.2* 8.9*  HCT 29.2* 31.3* 33.0* 29.2* 27.8*  MCV 100.0 98.4 99.1 98.6 96.9  PLT 244 273 294 302 305    Cardiac Enzymes: No results for input(s): CKTOTAL, CKMB, CKMBINDEX, TROPONINI in the last 168 hours.  Lipid Panel: No results for input(s): CHOL, TRIG, HDL, CHOLHDL, VLDL, LDLCALC in the last 168 hours.  CBG:  Recent Labs Lab 11/25/15 2347 11/26/15 0337 11/26/15 0755 11/26/15 1146 11/26/15 1608  GLUCAP 111* 111* 125* 142* 121*    Microbiology: Results for orders placed or performed during the hospital encounter of 11/04/15  Culture, Urine     Status: None   Collection Time: 11/05/15 12:05 AM  Result Value Ref Range Status   Specimen Description Urine  Final   Special Requests NONE  Final   Culture 70,000 COLONIES/ml ENTEROCOCCUS SPECIES  Final   Report Status 11/07/2015  FINAL  Final   Organism ID, Bacteria ENTEROCOCCUS SPECIES  Final      Susceptibility   Enterococcus species - MIC*    AMPICILLIN <=2 SENSITIVE Sensitive     VANCOMYCIN 1 SENSITIVE Sensitive     GENTAMICIN SYNERGY SENSITIVE Sensitive     * 70,000 COLONIES/ml ENTEROCOCCUS SPECIES  Culture, blood (routine x 2)     Status: None   Collection Time: 11/05/15  9:50 AM  Result Value Ref Range Status  Specimen Description BLOOD RIGHT HAND  Final   Special Requests IN PEDIATRIC BOTTLE 1CC  Final   Culture NO GROWTH 5 DAYS  Final   Report Status 11/10/2015 FINAL  Final  Culture, blood (routine x 2)     Status: None   Collection Time: 11/05/15  9:53 AM  Result Value Ref Range Status   Specimen Description BLOOD RIGHT ANTECUBITAL  Final   Special Requests BOTTLES DRAWN AEROBIC AND ANAEROBIC 5CC  Final   Culture NO GROWTH 5 DAYS  Final   Report Status 11/10/2015 FINAL  Final  MRSA PCR Screening     Status: None   Collection Time: 11/12/15  6:16 PM  Result Value Ref Range Status   MRSA by PCR NEGATIVE NEGATIVE Final    Comment:        The GeneXpert MRSA Assay (FDA approved for NASAL specimens only), is one component of a comprehensive MRSA colonization surveillance program. It is not intended to diagnose MRSA infection nor to guide or monitor treatment for MRSA infections.     Imaging: No results found.  Assessment and plan:   Kathryn [name] is an 82 y.o. female patient with deconditioning from prolonged ICU stay, Parkinson's disease with rigidity and resting tremor, resolved seizures, on Keppra and Vimpat at this time. She has difficulty weaning off of the ventilator and remains intubated. Recommend follow-up EEG for prognostication.  EEG this morning continued to show evidence of mild encephalopathy, no abnormal epileptiform discharges or seizures were seen.  Had a family meeting with patient's son and daughter-in-law, discussed at length about her current neurological assessment and  prognosis for recovery. Keeping in view with patient's wishes, her son is considering comfort measures given poor prognosis for meaningful neurological recovery to a condition prior to this hospitalization , from her current status with underlying  multiple neurological comorbidities as discussed. Per family request, recommend to obtain a palliative care consultation to further discuss options with the family.  We'll follow-up.

## 2015-11-26 NOTE — Progress Notes (Signed)
Called Dr. Silverio Decamp to see if he could meet with family today to update them develop a plan of care for pt.  Dr. Silverio Decamp wants to complete an EEG this am to have results to speak to the family about.

## 2015-11-26 NOTE — Progress Notes (Signed)
PULMONARY / CRITICAL CARE MEDICINE   Name: Kathryn [name] MRN: [id number] DOB: November 22, 1930    ADMISSION DATE:  11/04/2015 CONSULTATION DATE:  11/09/15  REFERRING MD:  Neurology / Dr. Nicole Kindred   CHIEF COMPLAINT: Seizure   Brief History:  [name] with PMH of CVA and recent admission for status epilepticus 2/6-3/4 readmitted 3/5 for repeat seizure activity.  Despite AED's, the patient continues to have seizures on continuous EEG.  PCCM called for elective intubation for deep sedation to control seizures.   SUBJECTIVE:  No acute events overnight. Was moe awake over weekend  per nursing staff but now less so afebrile  good UO  REVIEW OF SYSTEMS:  Unable to obtain given intubation and altered mentation.  VITAL SIGNS: BP 123/65 mmHg  Pulse 87  Temp(Src) 97.5 F (36.4 C) (Axillary)  Resp 11  Ht 5\' 3"  (1.6 m)  Wt 125 lb 3.5 oz (56.8 kg)  BMI 22.19 kg/m2  SpO2 100%  VENTILATOR SETTINGS: Vent Mode:  [-] PSV;CPAP FiO2 (%):  [30 %] 30 % Set Rate:  [10 bmp] 10 bmp Vt Set:  [420 mL] 420 mL PEEP:  [5 cmH20] 5 cmH20 Pressure Support:  [10 cmH20] 10 cmH20 Plateau Pressure:  [12 cmH20-17 cmH20] 13 cmH20  INTAKE / OUTPUT: I/O last 3 completed shifts: In: 4211 [I.V.:850; NG/GT:2820; IV Piggyback:541] Out: 2650 [Urine:2650]  PHYSICAL EXAMINATION: General: No acute distress. Appears comfortable. Integument:  Warm & dry. No rash on exposed skin.  HEENT: No scleral injection. Endotracheal tube in place.  Pupils equal. Cardiovascular:  Regular rhythm. No edema. No appreciable JVD.  Pulmonary:  Clear bilaterally to auscultation. Symmetric chest wall rise on ventilator. Abdomen: Soft. Normal bowel sounds.  Grossly nondistended.  Neurological: Patient having no spontaneous movement of extremities now. Does not appear to track to voice. Opens eyes to name  LABS:  BMET  Recent Labs Lab 11/24/15 0704 11/25/15 0249 11/26/15 0508  NA 142 140 136  K 4.1 3.3* 3.6  CL 110 106 103  CO2 24 26  23   BUN 26* 18 18  CREATININE 0.32* 0.31* 0.32*  GLUCOSE 126* 132* 122*    Electrolytes  Recent Labs Lab 11/24/15 0224 11/24/15 0704 11/25/15 0249 11/26/15 0508  CALCIUM 10.0 10.0 9.8 9.6  MG 1.7  --  1.4* 1.8  PHOS 3.0  --  2.7 3.1    CBC  Recent Labs Lab 11/24/15 0224 11/25/15 0249 11/26/15 0508  WBC 6.7 6.6 5.7  HGB 10.7* 9.2* 8.9*  HCT 33.0* 29.2* 27.8*  PLT 294 302 305    Coag's No results for input(s): APTT, INR in the last 168 hours.  Sepsis Markers No results for input(s): LATICACIDVEN, PROCALCITON, O2SATVEN in the last 168 hours.  ABG No results for input(s): PHART, PCO2ART, PO2ART in the last 168 hours.  Liver Enzymes  Recent Labs Lab 11/24/15 0224 11/25/15 0249 11/26/15 0508  ALBUMIN 2.5* 2.4* 2.5*    Cardiac Enzymes No results for input(s): TROPONINI, PROBNP in the last 168 hours.  Glucose  Recent Labs Lab 11/25/15 1121 11/25/15 1526 11/25/15 2033 11/25/15 2347 11/26/15 0337 11/26/15 0755  GLUCAP 140* 130* 112* 111* 111* 125*    Imaging No results found.   STUDIES:  3/10 Continuous EEG >> Left hemispheric PLEDS were present with maximum negativity in the left posterotemporal/parietal cortex, although less developed, and less organized essentially unchanged compare to day 3 of recording. There were no subclinical seizures. 3/13 continueous EEG >> burst suppression pattern but no seizure focus  3/20 EEG >>occasional, although infrequent, discharges arising out of the left mid temporal region - no electrographic seizures.  3/23 EEG >> no seizures  MICROBIOLOGY: MRSA PCR 3/13:  Negative Blood Ctx x2 3/6:  Negative Urine Ctx 3/6:  Enterococcus HIV 3/6:  Negative Influenza PCR 3/6:  Negative   ANTIBIOTICS: Aztreonam 3/6 - 3/6  Fortaz 3/6 - 3/8 Vanco 3/6 - 3/12  SIGNIFICANT EVENTS: 3/04  Discharged after episode of status epilepticus 3/05  Readmitted  LINES/TUBES: OETT 7.5 3/10 >> OGT 3/10>>> Foley 3/5>>> PIV x1 L  IJ CVL 3/11 - ?  ASSESSMENT / PLAN:  NEUROLOGIC A:   Acute Encephalopathy - In setting of Status Epilepticus. Status Epilepticus - Continuing to have intermittent focal seizures. H/O  CVA & Parkinson's Disease  P:   Management per Neurology RASS goal: 0 On Vimpat & Keprra Fentanyl IV prn Foot drop boots Ativan IV prn seizure  PULMONARY A: OSA - on CPAP H/O Difficult Airway  Acute resp failure in setting of seizures  P:   SBTs but not ready to extubate   CARDIOVASCULAR A:  Shock - Resolved. Sepsis from UTI vs medication. H/O Hypertension    P:   telemetry Continuing Inderal & Norvasc - home meds  RENAL A:   Hypokalemia - Mild. Replacing. Hypomagnesemia - Replacing. Hypernatremia - Resolved.  P:   Lasix on hold  replete lytes as needed Trending UOP with foley Monitoring electrolytes & renal function daily  GASTROINTESTINAL A:   No acute issues.  P:   Continue Tube Feedings Protonix VT daily  HEMATOLOGIC A:   Anemia - Hgb stable. No signs of active bleeding. Secondary to chronic disease.  P:  Trending cell counts daily w/ CBC Lovenox for DVT prophylaxis  SCDs  INFECTIOUS A:   Enterococcus UTI - Multiple Abx allergies. Completed 7 days Vancomycin 3/12.  P:   Monitor for new signs of infection.  ENDOCRINE A:   No acute issues.   P:   Monitor glucose on BMP   FAMILY UPDATES: Daughter in law updated 3/24    TODAY'S SUMMARY:  [name] with recurrent status epilepticus. Focal. To seizure activity seemed to have significantly improved. Neurological status now appears stagnant again. This is in the main barrier to extubation. Need Neuro prognosis explained to family , then can have goals of care discussion with family again.  The patient is critically ill with multiple organ systems failure and requires high complexity decision making for assessment and support, frequent evaluation and titration of therapies, application of advanced  monitoring technologies and extensive interpretation of multiple databases. Critical Care Time devoted to patient care services described in this note independent of APP time is 31 minutes.   Kara Mead MD. [facility]. [facility] Pager [phone] If no response call [phone]   11/26/2015     11/26/2015, 9:44 AM

## 2015-11-27 LAB — CBC WITH DIFFERENTIAL/PLATELET
BASOS ABS: 0 10*3/uL (ref 0.0–0.1)
BASOS PCT: 0 %
EOS ABS: 0.3 10*3/uL (ref 0.0–0.7)
Eosinophils Relative: 4 %
HEMATOCRIT: 29.9 % — AB (ref 36.0–46.0)
HEMOGLOBIN: 9.8 g/dL — AB (ref 12.0–15.0)
Lymphocytes Relative: 19 %
Lymphs Abs: 1.3 10*3/uL (ref 0.7–4.0)
MCH: 31.5 pg (ref 26.0–34.0)
MCHC: 32.8 g/dL (ref 30.0–36.0)
MCV: 96.1 fL (ref 78.0–100.0)
MONOS PCT: 15 %
Monocytes Absolute: 1 10*3/uL (ref 0.1–1.0)
NEUTROS ABS: 4.1 10*3/uL (ref 1.7–7.7)
NEUTROS PCT: 62 %
Platelets: 273 10*3/uL (ref 150–400)
RBC: 3.11 MIL/uL — ABNORMAL LOW (ref 3.87–5.11)
RDW: 15.4 % (ref 11.5–15.5)
WBC: 6.6 10*3/uL (ref 4.0–10.5)

## 2015-11-27 LAB — GLUCOSE, CAPILLARY
GLUCOSE-CAPILLARY: 125 mg/dL — AB (ref 65–99)
GLUCOSE-CAPILLARY: 140 mg/dL — AB (ref 65–99)
GLUCOSE-CAPILLARY: 95 mg/dL (ref 65–99)
GLUCOSE-CAPILLARY: 113 mg/dL — AB (ref 65–99)
Glucose-Capillary: 112 mg/dL — ABNORMAL HIGH (ref 65–99)
Glucose-Capillary: 122 mg/dL — ABNORMAL HIGH (ref 65–99)

## 2015-11-27 LAB — BASIC METABOLIC PANEL
ANION GAP: 10 (ref 5–15)
BUN: 16 mg/dL (ref 6–20)
CHLORIDE: 105 mmol/L (ref 101–111)
CO2: 24 mmol/L (ref 22–32)
Calcium: 9.7 mg/dL (ref 8.9–10.3)
Creatinine, Ser: 0.33 mg/dL — ABNORMAL LOW (ref 0.44–1.00)
GFR calc non Af Amer: >60 mL/min (ref >60)
Glucose, Bld: 111 mg/dL — ABNORMAL HIGH (ref 65–99)
POTASSIUM: 4.2 mmol/L (ref 3.5–5.1)
SODIUM: 139 mmol/L (ref 135–145)

## 2015-11-27 LAB — RENAL FUNCTION PANEL
ALBUMIN: 2.5 g/dL — AB (ref 3.5–5.0)
Anion gap: 9 (ref 5–15)
BUN: 17 mg/dL (ref 6–20)
CHLORIDE: 104 mmol/L (ref 101–111)
CO2: 26 mmol/L (ref 22–32)
CREATININE: 0.31 mg/dL — AB (ref 0.44–1.00)
Calcium: 9.9 mg/dL (ref 8.9–10.3)
GFR calc non Af Amer: >60 mL/min (ref >60)
Glucose, Bld: 120 mg/dL — ABNORMAL HIGH (ref 65–99)
Phosphorus: 2.8 mg/dL (ref 2.5–4.6)
Potassium: 2.9 mmol/L — ABNORMAL LOW (ref 3.5–5.1)
SODIUM: 139 mmol/L (ref 135–145)

## 2015-11-27 LAB — MAGNESIUM: MAGNESIUM: 1.5 mg/dL — AB (ref 1.7–2.4)

## 2015-11-27 MED ORDER — POTASSIUM CHLORIDE 20 MEQ/15ML (10%) PO SOLN
40.0000 meq | ORAL | Status: AC
  Administered 2015-11-27 (×2): 40 meq
  Filled 2015-11-27 (×2): qty 30

## 2015-11-27 MED ORDER — SODIUM CHLORIDE 0.9 % IV SOLN
6.0000 g | Freq: Once | INTRAVENOUS | Status: AC
  Administered 2015-11-27: 6 g via INTRAVENOUS
  Filled 2015-11-27: qty 12

## 2015-11-27 NOTE — Progress Notes (Signed)
Palliative Medicine RN Note: Called son Tommie Raymond to set meeting. He and wife are tentatively available tomorrow am at 0830 to meet with Dr Hilma Favors. Family has contact info for PMT RN in case time needs to be changed.  Larina Earthly, RN, BSN, [facility] 11/27/2015 2:04 PM Cell [phone] 8:00-4:00 Monday-Friday Office [phone]

## 2015-11-27 NOTE — Progress Notes (Signed)
PULMONARY / CRITICAL CARE MEDICINE   Name: Kathryn [name] MRN: [id number] DOB: 26-Oct-1930    ADMISSION DATE:  11/04/2015 CONSULTATION DATE:  11/09/15  REFERRING MD:  Neurology / Dr. Nicole [name]   CHIEF COMPLAINT: Seizure   Brief History:  [name] with PMH of CVA and recent admission for status epilepticus 2/6-3/4 readmitted 3/5 for repeat seizure activity.  Despite AED's, the patient continues to have seizures on continuous EEG.  PCCM called for elective intubation for deep sedation to control seizures.   SUBJECTIVE:   more awake , trying to talk through ETT afebrile  good UO  REVIEW OF SYSTEMS:  Unable to obtain given intubation and altered mentation.  VITAL SIGNS: BP 157/67 mmHg  Pulse 87  Temp(Src) 98 F (36.7 C) (Axillary)  Resp 13  Ht 5\' 3"  (1.6 m)  Wt 123 lb 14.4 oz (56.2 kg)  BMI 21.95 kg/m2  SpO2 100%  VENTILATOR SETTINGS: Vent Mode:  [-] PRVC FiO2 (%):  [30 %] 30 % Set Rate:  [10 bmp] 10 bmp Vt Set:  [420 mL] 420 mL PEEP:  [5 cmH20] 5 cmH20 Plateau Pressure:  [12 cmH20-14 cmH20] 14 cmH20  INTAKE / OUTPUT: I/O last 3 completed shifts: In: 4102.7 [I.V.:1207.7; NG/GT:2460; IV Piggyback:435] Out: 2425 [Urine:2425]  PHYSICAL EXAMINATION: General: No acute distress. Appears comfortable. Integument:  Warm & dry. No rash on exposed skin.  HEENT: No scleral injection. Endotracheal tube in place.  Pupils equal. Cardiovascular:  Regular rhythm. No edema. No appreciable JVD.  Pulmonary:  Clear bilaterally to auscultation. Symmetric chest wall rise on ventilator. Abdomen: Soft. Normal bowel sounds.  Grossly nondistended.  Neurological: more spontaneous movement of extremities now. Tracks voice. Opens eyes to name  LABS:  BMET  Recent Labs Lab 11/25/15 0249 11/26/15 0508 11/27/15 0327  NA 140 136 139  K 3.3* 3.6 2.9*  CL 106 103 104  CO2 26 23 26   BUN 18 18 17   CREATININE 0.31* 0.32* 0.31*  GLUCOSE 132* 122* 120*    Electrolytes  Recent Labs Lab  11/25/15 0249 11/26/15 0508 11/27/15 0327  CALCIUM 9.8 9.6 9.9  MG 1.4* 1.8 1.5*  PHOS 2.7 3.1 2.8    CBC  Recent Labs Lab 11/25/15 0249 11/26/15 0508 11/27/15 0327  WBC 6.6 5.7 6.6  HGB 9.2* 8.9* 9.8*  HCT 29.2* 27.8* 29.9*  PLT 302 305 273    Coag's No results for input(s): APTT, INR in the last 168 hours.  Sepsis Markers  Recent Labs Lab 11/26/15 1510  LATICACIDVEN 0.7    ABG No results for input(s): PHART, PCO2ART, PO2ART in the last 168 hours.  Liver Enzymes  Recent Labs Lab 11/26/15 0508 11/26/15 1510 11/27/15 0327  AST  --  35  --   ALT  --  32  --   ALKPHOS  --  68  --   BILITOT  --  0.4  --   ALBUMIN 2.5* 2.5* 2.5*    Cardiac Enzymes No results for input(s): TROPONINI, PROBNP in the last 168 hours.  Glucose  Recent Labs Lab 11/26/15 1146 11/26/15 1608 11/26/15 1950 11/26/15 2321 11/27/15 0324 11/27/15 0810  GLUCAP 142* 121* 110* 113* 112* 125*    Imaging No results found.   STUDIES:  3/10 Continuous EEG >> Left hemispheric PLEDS were present with maximum negativity in the left posterotemporal/parietal cortex, although less developed, and less organized essentially unchanged compare to day 3 of recording. There were no subclinical seizures. 3/13 continueous EEG >> burst suppression pattern  but no seizure focus 3/20 EEG >>occasional, although infrequent, discharges arising out of the left mid temporal region - no electrographic seizures.  3/23 EEG >> no seizures  MICROBIOLOGY: MRSA PCR 3/13:  Negative Blood Ctx x2 3/6:  Negative Urine Ctx 3/6:  Enterococcus HIV 3/6:  Negative Influenza PCR 3/6:  Negative   ANTIBIOTICS: Aztreonam 3/6 - 3/6  Fortaz 3/6 - 3/8 Vanco 3/6 - 3/12  SIGNIFICANT EVENTS: 3/04  Discharged after episode of status epilepticus 3/05  Readmitted  LINES/TUBES: OETT 7.5 3/10 >> OGT 3/10>>> Foley 3/5>>> PIV x1 L IJ CVL 3/11 - ?  ASSESSMENT / PLAN:  NEUROLOGIC A:   Acute Encephalopathy - In  setting of Status Epilepticus. Status Epilepticus - Continuing to have intermittent focal seizures. H/O  CVA & Parkinson's Disease  P:   Management per Neurology RASS goal: 0 On Vimpat & Keprra Fentanyl IV prn Foot drop boots Ativan IV prn seizure  PULMONARY A: OSA - on CPAP H/O Difficult Airway  Acute resp failure in setting of seizures  P:   SBTs -one way extubation at some point   CARDIOVASCULAR A:  Shock - Resolved. Sepsis from UTI vs medication. H/O Hypertension    P:   telemetry Continuing Inderal & Norvasc - home meds  RENAL A:   Hypokalemia  Hypomagnesemia - Hypernatremia - Resolved.  P:   Lasix on hold  replete lytes as needed Trending UOP with foley Monitoring electrolytes & renal function daily  GASTROINTESTINAL A:   No acute issues.  P:   Continue Tube Feedings Protonix VT daily  HEMATOLOGIC A:   Anemia - Hgb stable. No signs of active bleeding. Secondary to chronic disease.  P:  Trending cell counts daily w/ CBC Lovenox for DVT prophylaxis  SCDs  INFECTIOUS A:   Enterococcus UTI - Multiple Abx allergies. Completed 7 days Vancomycin 3/12.  P:   Monitor for new signs of infection.  ENDOCRINE A:   No acute issues.   P:   Monitor glucose on BMP   FAMILY UPDATES: Daughter in law updated 3/24 , son Kathryn [name] 3/28   TODAY'S SUMMARY:  [name] with recurrent status epilepticus. Focal. -now resolved for now. Of note she was discharged to rehabilitation for barely one day before being readmitted with seizures.Neurological status now appears stagnant again. This is in the main barrier to extubation. Need Neuro prognosis explained to family , then are agreeable to one way extubation but want to defer until after palliative care consult, son Kathryn [name] wants time to take care of mom's papers  The patient is critically ill with multiple organ systems failure and requires high complexity decision making for assessment and support, frequent  evaluation and titration of therapies, application of advanced monitoring technologies and extensive interpretation of multiple databases. Critical Care Time devoted to patient care services described in this note independent of APP time is 31 minutes.   Kathryn Mead MD. [place]. [facility] Pager [phone] If no response call [phone]   11/27/2015     11/27/2015, 8:39 AM

## 2015-11-27 NOTE — Progress Notes (Signed)
[facility] ADULT ICU REPLACEMENT PROTOCOL FOR AM LAB REPLACEMENT ONLY  The patient does apply for the [facility] Adult ICU Electrolyte Replacment Protocol based on the criteria listed below:   1. Is GFR >/= 40 ml/min? Yes.    Patient's GFR today is >60 2. Is urine output >/= 0.5 ml/kg/hr for the last 6 hours? Yes.   Patient's UOP is 1.48 ml/kg/hr 3. Is BUN < 60 mg/dL? Yes.    Patient's BUN today is 17 4. Abnormal electrolyte  K 2.9, Mg 1.5,  5. Ordered repletion with: per protocol 6. If a panic level lab has been reported, has the CCM MD in charge been notified? Yes.  .   Physician:  [place] 11/27/2015 5:14 AM

## 2015-11-28 DIAGNOSIS — Z515 Encounter for palliative care: Secondary | ICD-10-CM

## 2015-11-28 DIAGNOSIS — Z789 Other specified health status: Secondary | ICD-10-CM

## 2015-11-28 LAB — CBC WITH DIFFERENTIAL/PLATELET
BASOS ABS: 0 10*3/uL (ref 0.0–0.1)
Basophils Relative: 0 %
EOS ABS: 0.2 10*3/uL (ref 0.0–0.7)
Eosinophils Relative: 3 %
HCT: 30.6 % — ABNORMAL LOW (ref 36.0–46.0)
HEMOGLOBIN: 9.7 g/dL — AB (ref 12.0–15.0)
LYMPHS PCT: 17 %
Lymphs Abs: 1.1 10*3/uL (ref 0.7–4.0)
MCH: 30.4 pg (ref 26.0–34.0)
MCHC: 31.7 g/dL (ref 30.0–36.0)
MCV: 95.9 fL (ref 78.0–100.0)
MONO ABS: 1 10*3/uL (ref 0.1–1.0)
Monocytes Relative: 15 %
NEUTROS PCT: 65 %
Neutro Abs: 4.2 10*3/uL (ref 1.7–7.7)
PLATELETS: 275 10*3/uL (ref 150–400)
RBC: 3.19 MIL/uL — AB (ref 3.87–5.11)
RDW: 15.4 % (ref 11.5–15.5)
WBC: 6.5 10*3/uL (ref 4.0–10.5)

## 2015-11-28 LAB — RENAL FUNCTION PANEL
ALBUMIN: 2.6 g/dL — AB (ref 3.5–5.0)
ANION GAP: 10 (ref 5–15)
BUN: 19 mg/dL (ref 6–20)
CALCIUM: 9.9 mg/dL (ref 8.9–10.3)
CO2: 24 mmol/L (ref 22–32)
Chloride: 104 mmol/L (ref 101–111)
Creatinine, Ser: 0.35 mg/dL — ABNORMAL LOW (ref 0.44–1.00)
GLUCOSE: 117 mg/dL — AB (ref 65–99)
PHOSPHORUS: 3.4 mg/dL (ref 2.5–4.6)
POTASSIUM: 3.5 mmol/L (ref 3.5–5.1)
Sodium: 138 mmol/L (ref 135–145)

## 2015-11-28 LAB — GLUCOSE, CAPILLARY
GLUCOSE-CAPILLARY: 121 mg/dL — AB (ref 65–99)
Glucose-Capillary: 111 mg/dL — ABNORMAL HIGH (ref 65–99)

## 2015-11-28 LAB — MAGNESIUM: MAGNESIUM: 1.9 mg/dL (ref 1.7–2.4)

## 2015-11-28 MED ORDER — SODIUM CHLORIDE 0.9 % IV SOLN
1.0000 mg/h | INTRAVENOUS | Status: DC
  Administered 2015-11-28: 1 mg/h via INTRAVENOUS
  Filled 2015-11-28: qty 10

## 2015-11-28 MED ORDER — MORPHINE BOLUS VIA INFUSION
2.0000 mg | INTRAVENOUS | Status: DC | PRN
  Filled 2015-11-28: qty 2

## 2015-11-28 MED ORDER — SODIUM CHLORIDE 0.9 % IV SOLN
1.0000 mg/h | INTRAVENOUS | Status: DC
  Administered 2015-11-28 – 2015-12-03 (×4): 1 mg/h via INTRAVENOUS
  Filled 2015-11-28 (×4): qty 10

## 2015-11-28 NOTE — Progress Notes (Signed)
Met with family -son Dacey Milberger and DIL Roseanne Kaufman. Plan is for compassionate extubation to comfort care tomorrow at [facility]. Will go ahead and initiate comfort measures while on the ventilator.   Lane Hacker, DO Palliative Medicine

## 2015-11-28 NOTE — Care Management Note (Signed)
Patient care status was changed to comfort measures only by family, planned extubation tomorrow morning.  No further recommendations from neurology. We'll sign off.

## 2015-11-28 NOTE — Care Management Important Message (Signed)
Important Message  Patient Details  Name: Kathryn [name] MRN: [id number] Date of Birth: [birth year]   Medicare Important Message Given:  Yes    [name] May 11/28/2015, 1:44 PM

## 2015-11-28 NOTE — Progress Notes (Signed)
Nutrition Brief Note  Chart reviewed. Pt will transition to comfort care, plan to withdrawal vent tomorrow.  No further nutrition interventions warranted at this time.  Please re-consult as needed.   [place], [place], [place] Pager [phone] After Hours Pager

## 2015-11-28 NOTE — Progress Notes (Signed)
PULMONARY / CRITICAL CARE MEDICINE   Name: Kathryn [name] MRN: [id number] DOB: 06-Nov-1930    ADMISSION DATE:  11/04/2015 CONSULTATION DATE:  11/09/15  REFERRING MD:  Neurology / Dr. Nicole Kindred   CHIEF COMPLAINT: Seizure   Brief History:  [name] with PMH of CVA and recent admission for status epilepticus 2/6-3/4 readmitted 3/5 for repeat seizure activity.  Despite AED's, the patient continues to have seizures on continuous EEG.  PCCM called for elective intubation for deep sedation to control seizures.   SUBJECTIVE:   Unresponsive this AM and did not do well in weaning trial, low vol and high rate.  VITAL SIGNS: BP 167/71 mmHg  Pulse 89  Temp(Src) 98.7 F (37.1 C) (Axillary)  Resp 19  Ht 5\' 3"  (1.6 m)  Wt 54.7 kg (120 lb 9.5 oz)  BMI 21.37 kg/m2  SpO2 100%  VENTILATOR SETTINGS: Vent Mode:  [-] PSV;CPAP FiO2 (%):  [30 %] 30 % Set Rate:  [10 bmp] 10 bmp Vt Set:  [420 mL] 420 mL PEEP:  [5 cmH20] 5 cmH20 Pressure Support:  [8 cmH20] 8 cmH20 Plateau Pressure:  [8 cmH20-14 cmH20] 14 cmH20  INTAKE / OUTPUT: I/O last 3 completed shifts: In: 3392.7 [I.V.:477.7; NG/GT:2480; IV Piggyback:435] Out: 2950 [Urine:2950]  PHYSICAL EXAMINATION: General: No acute distress. Appears comfortable. Integument:  Warm & dry. No rash on exposed skin.  HEENT: No scleral injection. Endotracheal tube in place.  Pupils equal. Cardiovascular:  Regular rhythm. No edema. No appreciable JVD.  Pulmonary:  Clear bilaterally to auscultation. Symmetric chest wall rise on ventilator. Abdomen: Soft. Normal bowel sounds.  Grossly nondistended.  Neurological: more spontaneous movement of extremities now. Tracks voice. Opens eyes to name  LABS:  BMET  Recent Labs Lab 11/27/15 0327 11/27/15 1955 11/28/15 0551  NA 139 139 138  K 2.9* 4.2 3.5  CL 104 105 104  CO2 26 24 24   BUN 17 16 19   CREATININE 0.31* 0.33* 0.35*  GLUCOSE 120* 111* 117*   Electrolytes  Recent Labs Lab 11/26/15 0508 11/27/15 0327  11/27/15 1955 11/28/15 0230 11/28/15 0551  CALCIUM 9.6 9.9 9.7  --  9.9  MG 1.8 1.5*  --  1.9  --   PHOS 3.1 2.8  --   --  3.4   CBC  Recent Labs Lab 11/26/15 0508 11/27/15 0327 11/28/15 0551  WBC 5.7 6.6 6.5  HGB 8.9* 9.8* 9.7*  HCT 27.8* 29.9* 30.6*  PLT 305 273 275    Coag's No results for input(s): APTT, INR in the last 168 hours.  Sepsis Markers  Recent Labs Lab 11/26/15 1510  LATICACIDVEN 0.7   ABG No results for input(s): PHART, PCO2ART, PO2ART in the last 168 hours.  Liver Enzymes  Recent Labs Lab 11/26/15 1510 11/27/15 0327 11/28/15 0551  AST 35  --   --   ALT 32  --   --   ALKPHOS 68  --   --   BILITOT 0.4  --   --   ALBUMIN 2.5* 2.5* 2.6*   Cardiac Enzymes No results for input(s): TROPONINI, PROBNP in the last 168 hours.  Glucose  Recent Labs Lab 11/27/15 1122 11/27/15 1607 11/27/15 1952 11/27/15 2310 11/28/15 0343 11/28/15 0759  GLUCAP 140* 122* 95 113* 111* 121*   Imaging No results found.  STUDIES:  3/10 Continuous EEG >> Left hemispheric PLEDS were present with maximum negativity in the left posterotemporal/parietal cortex, although less developed, and less organized essentially unchanged compare to day 3 of recording.  There were no subclinical seizures. 3/13 continueous EEG >> burst suppression pattern but no seizure focus 3/20 EEG >>occasional, although infrequent, discharges arising out of the left mid temporal region - no electrographic seizures.  3/23 EEG >> no seizures  MICROBIOLOGY: MRSA PCR 3/13:  Negative Blood Ctx x2 3/6:  Negative Urine Ctx 3/6:  Enterococcus HIV 3/6:  Negative Influenza PCR 3/6:  Negative   ANTIBIOTICS: Aztreonam 3/6 - 3/6  Fortaz 3/6 - 3/8 Vanco 3/6 - 3/12  SIGNIFICANT EVENTS: 3/04  Discharged after episode of status epilepticus 3/05  Readmitted  LINES/TUBES: OETT 7.5 3/10 >> OGT 3/10>>> Foley 3/5>>> PIV x1 L IJ CVL 3/11 - ?  ASSESSMENT / PLAN:  NEUROLOGIC A:   Acute  Encephalopathy - In setting of Status Epilepticus. Status Epilepticus - Continuing to have intermittent focal seizures. H/O  CVA & Parkinson's Disease  P:   Begin morphine drip for comfort.  PULMONARY A: OSA - on CPAP H/O Difficult Airway  Acute resp failure in setting of seizures  P:   When family is ready then one way extubation.  CARDIOVASCULAR A:  Shock - Resolved. Sepsis from UTI vs medication. H/O Hypertension    P:  Telemetry Continuing Inderal & Norvasc - home meds  RENAL A:   Hypokalemia  Hypomagnesemia - Hypernatremia - Resolved.  P:   D/C further blood draws at this point.  GASTROINTESTINAL A:   No acute issues.  P:   Continue Tube Feedings Protonix VT daily  HEMATOLOGIC A:   Anemia - Hgb stable. No signs of active bleeding. Secondary to chronic disease.  P:  D/C blood draws. Lovenox.  INFECTIOUS A:   Enterococcus UTI - Multiple Abx allergies. Completed 7 days Vancomycin 3/12.  P:   No further abx at this point, course complete.  ENDOCRINE A:   No acute issues.   P:   Monitor glucose on BMP   FAMILY UPDATES: Spoke with son and daughter in law at length along with palliative care.  After discussion, made patient DNR, start morphine drip and once grand daughter arrives in AM.  The patient is critically ill with multiple organ systems failure and requires high complexity decision making for assessment and support, frequent evaluation and titration of therapies, application of advanced monitoring technologies and extensive interpretation of multiple databases.   Critical Care Time devoted to patient care services described in this note is  35  Minutes. This time reflects time of care of this signee Dr Jennet Maduro. This critical care time does not reflect procedure time, or teaching time or supervisory time of PA/NP/Med student/Med Resident etc but could involve care discussion time.  Rush Farmer, M.D. [facility] Pulmonary/Critical Care  Medicine. Pager: [phone]. After hours pager: [phone].  11/28/2015, 11:02 AM

## 2015-11-29 MED ORDER — ACETAMINOPHEN 325 MG PO TABS: 650.0000 mg | ORAL_TABLET | Freq: Four times a day (QID) | ORAL | Status: DC | PRN

## 2015-11-29 MED ORDER — GLYCOPYRROLATE 0.2 MG/ML IJ SOLN
0.2000 mg | INTRAMUSCULAR | Status: DC | PRN
  Administered 2015-11-30 – 2015-12-03 (×5): 0.2 mg via INTRAVENOUS
  Filled 2015-11-29 (×5): qty 1

## 2015-11-29 MED ORDER — MIDAZOLAM BOLUS VIA INFUSION
2.0000 mg | INTRAVENOUS | Status: DC | PRN
  Administered 2015-11-30 – 2015-12-01 (×4): 2 mg via INTRAVENOUS
  Filled 2015-11-29 (×5): qty 4

## 2015-11-29 MED ORDER — BIOTENE DRY MOUTH MT LIQD: 15.0000 mL | OROMUCOSAL | Status: DC | PRN

## 2015-11-29 MED ORDER — MORPHINE BOLUS VIA INFUSION
2.0000 mg | INTRAVENOUS | Status: DC | PRN
  Filled 2015-11-29: qty 2

## 2015-11-29 MED ORDER — GLYCOPYRROLATE 1 MG PO TABS
1.0000 mg | ORAL_TABLET | ORAL | Status: DC | PRN
  Filled 2015-11-29: qty 1

## 2015-11-29 MED ORDER — GLYCOPYRROLATE 0.2 MG/ML IJ SOLN: 0.2000 mg | INTRAMUSCULAR | Status: DC | PRN

## 2015-11-29 MED ORDER — POLYVINYL ALCOHOL 1.4 % OP SOLN
1.0000 [drp] | Freq: Four times a day (QID) | OPHTHALMIC | Status: DC | PRN
  Filled 2015-11-29: qty 15

## 2015-11-29 MED ORDER — ACETAMINOPHEN 650 MG RE SUPP: 650.0000 mg | Freq: Four times a day (QID) | RECTAL | Status: DC | PRN

## 2015-11-29 NOTE — Progress Notes (Signed)
PULMONARY / CRITICAL CARE MEDICINE   Name: Kathryn [name] MRN: [id number] DOB: 08-25-1931    ADMISSION DATE:  11/04/2015 CONSULTATION DATE:  11/09/15  REFERRING MD:  Neurology / Dr. Nicole Kindred   CHIEF COMPLAINT: Seizure   Brief History:  [name] with PMH of CVA and recent admission for status epilepticus 2/6-3/4 readmitted 3/5 for repeat seizure activity.  Despite AED's, the patient continues to have seizures on continuous EEG.  PCCM called for elective intubation for deep sedation to control seizures.   SUBJECTIVE:   Arousable but not purposeful this AM.  VITAL SIGNS: BP 138/69 mmHg  Pulse 77  Temp(Src) 98.7 F (37.1 C) (Axillary)  Resp 12  Ht 5\' 3"  (1.6 m)  Wt 54.7 kg (120 lb 9.5 oz)  BMI 21.37 kg/m2  SpO2 100%  VENTILATOR SETTINGS: Vent Mode:  [-] PSV;CPAP FiO2 (%):  [30 %] 30 % Set Rate:  [10 bmp] 10 bmp Vt Set:  [420 mL] 420 mL PEEP:  [5 cmH20] 5 cmH20 Pressure Support:  [8 cmH20] 8 cmH20 Plateau Pressure:  [11 cmH20-15 cmH20] 13 cmH20  INTAKE / OUTPUT: I/O last 3 completed shifts: In: 6 [I.V.:390; NG/GT:1075; IV Piggyback:415] Out: [id number]  PHYSICAL EXAMINATION: General: No acute distress. Appears comfortable. Integument:  Warm & dry. No rash on exposed skin.  HEENT: No scleral injection. Endotracheal tube in place.  Pupils equal. Cardiovascular:  Regular rhythm. No edema. No appreciable JVD.  Pulmonary:  Clear bilaterally to auscultation. Symmetric chest wall rise on ventilator. Abdomen: Soft. Normal bowel sounds.  Grossly nondistended.  Neurological: more spontaneous movement of extremities now. Tracks voice. Opens eyes to name  LABS:  BMET  Recent Labs Lab 11/27/15 0327 11/27/15 1955 11/28/15 0551  NA 139 139 138  K 2.9* 4.2 3.5  CL 104 105 104  CO2 26 24 24   BUN 17 16 19   CREATININE 0.31* 0.33* 0.35*  GLUCOSE 120* 111* 117*   Electrolytes  Recent Labs Lab 11/26/15 0508 11/27/15 0327 11/27/15 1955 11/28/15 0230 11/28/15 0551   CALCIUM 9.6 9.9 9.7  --  9.9  MG 1.8 1.5*  --  1.9  --   PHOS 3.1 2.8  --   --  3.4   CBC  Recent Labs Lab 11/26/15 0508 11/27/15 0327 11/28/15 0551  WBC 5.7 6.6 6.5  HGB 8.9* 9.8* 9.7*  HCT 27.8* 29.9* 30.6*  PLT 305 273 275   Coag's No results for input(s): APTT, INR in the last 168 hours.  Sepsis Markers  Recent Labs Lab 11/26/15 1510  LATICACIDVEN 0.7   ABG No results for input(s): PHART, PCO2ART, PO2ART in the last 168 hours.  Liver Enzymes  Recent Labs Lab 11/26/15 1510 11/27/15 0327 11/28/15 0551  AST 35  --   --   ALT 32  --   --   ALKPHOS 68  --   --   BILITOT 0.4  --   --   ALBUMIN 2.5* 2.5* 2.6*   Cardiac Enzymes No results for input(s): TROPONINI, PROBNP in the last 168 hours.  Glucose  Recent Labs Lab 11/27/15 1122 11/27/15 1607 11/27/15 1952 11/27/15 2310 11/28/15 0343 11/28/15 0759  GLUCAP 140* 122* 95 113* 111* 121*   Imaging No results found.  STUDIES:  3/10 Continuous EEG >> Left hemispheric PLEDS were present with maximum negativity in the left posterotemporal/parietal cortex, although less developed, and less organized essentially unchanged compare to day 3 of recording. There were no subclinical seizures. 3/13 continueous EEG >> burst suppression  pattern but no seizure focus 3/20 EEG >>occasional, although infrequent, discharges arising out of the left mid temporal region - no electrographic seizures.  3/23 EEG >> no seizures  MICROBIOLOGY: MRSA PCR 3/13:  Negative Blood Ctx x2 3/6:  Negative Urine Ctx 3/6:  Enterococcus HIV 3/6:  Negative Influenza PCR 3/6:  Negative   ANTIBIOTICS: Aztreonam 3/6 - 3/6  Fortaz 3/6 - 3/8 Vanco 3/6 - 3/12  SIGNIFICANT EVENTS: 3/04  Discharged after episode of status epilepticus 3/05  Readmitted  LINES/TUBES: OETT 7.5 3/10 >> OGT 3/10>>> Foley 3/5>>> PIV x1 L IJ CVL 3/11 - ?  ASSESSMENT / PLAN:  NEUROLOGIC A:   Acute Encephalopathy - In setting of Status  Epilepticus. Status Epilepticus - Continuing to have intermittent focal seizures. H/O  CVA & Parkinson's Disease  P:   Morphine and versed for comfort. Extubate. O2 for comfort.  PULMONARY A: OSA - on CPAP H/O Difficult Airway  Acute resp failure in setting of seizures  P:   One way extubation now.  CARDIOVASCULAR A:  Shock - Resolved. Sepsis from UTI vs medication. H/O Hypertension    P:  D/C Telemetry. D/C all medications.  RENAL A:   Hypokalemia  Hypomagnesemia - Hypernatremia - Resolved.  P:   D/C further blood draws at this point.  GASTROINTESTINAL A:   No acute issues.  P:   D/C TF. Protonix VT daily.  HEMATOLOGIC A:   Anemia - Hgb stable. No signs of active bleeding. Secondary to chronic disease.  P:  D/C blood draws. D/C Lovenox.  INFECTIOUS A:   Enterococcus UTI - Multiple Abx allergies. Completed 7 days Vancomycin 3/12.  P:   No further abx at this point, course complete.  ENDOCRINE A:   No acute issues.   P:   D/C CBGs.  FAMILY UPDATES: Spoke with son and daughter in law, ready for extubation and comfort care.  The patient is critically ill with multiple organ systems failure and requires high complexity decision making for assessment and support, frequent evaluation and titration of therapies, application of advanced monitoring technologies and extensive interpretation of multiple databases.   Critical Care Time devoted to patient care services described in this note is  35  Minutes. This time reflects time of care of this signee Dr Jennet Maduro. This critical care time does not reflect procedure time, or teaching time or supervisory time of PA/NP/Med student/Med Resident etc but could involve care discussion time.  Rush Farmer, M.D. [facility] Pulmonary/Critical Care Medicine. Pager: [phone]. After hours pager: [phone].  11/29/2015, 10:28 AM

## 2015-11-29 NOTE — Procedures (Signed)
Extubation Procedure Note  Patient Details:   Name: Kathryn [name] DOB: [birth year] MRN: [id number]   Airway Documentation:     Evaluation  O2 sats: stable throughout Complications: No apparent complications Patient did not tolerate procedure well. Bilateral Breath Sounds: Clear, Diminished Suctioning: Oral, Airway No.  Pt was alert and extubated per Dr. Kayleen Memos.  Did not speak after extubation  Kathryn [name] V 11/29/2015, 10:19 AM

## 2015-11-29 NOTE — Progress Notes (Signed)
Extubation to comfort care this morning.  Family remains at bedside.  Will follow progress.  Reinaldo Raddle, RN, BSN  Trauma/Neuro ICU Case Manager [phone]

## 2015-11-29 NOTE — Clinical Social Work Note (Signed)
Clinical Social Worker received referral for comfort care support for family who has withdrawn support from patient.  CSW has spoken with RN regarding patient family.  Patient family appropriately transitioning through the grief process and do not have needs at this time.  RN to notify CSW if additional support needed or further needs arise following patient death.  CSW remains available for outside support to family and staff as needed.  Barbette Or, [place]

## 2015-11-29 NOTE — Progress Notes (Signed)
Responded to spiritual consult to visit with patient. Family at bedside. Per family patient was doing pretty good today. Provided ministry of presence, emotional and spiritual support.Will follow as needed.

## 2015-11-29 NOTE — Progress Notes (Addendum)
Daily Progress Note   Patient Name: Kathryn [name]       Date: 03/06/2016 DOB: 11/06/1930  Age: 82 y.o. MRN#: [id number] Attending Physician: No att. providers found Primary Care Physician: Lilian Coma, MD Admit Date: 11/04/2015  Reason for Consultation/Follow-up: Terminal Care  Subjective: Unresponsive.   Interval Events: Extubated to comfort 3/30, Family Meeting 3/29 Length of Stay: 28 days Palliative Performance Scale: 10%     Vital Signs: BP 163/55 mmHg  Pulse 89  Temp(Src) 98.9 F (37.2 C) (Axillary)  Resp 12  Ht 5\' 3"  (1.6 m)  Wt 54.7 kg (120 lb 9.5 oz)  BMI 21.37 kg/m2  SpO2 97% SpO2: SpO2: 97 % O2 Device: O2 Device: Not Delivered O2 Flow Rate: O2 Flow Rate (L/min): 3 L/min  Intake/output summary: No intake or output data in the 24 hours ending 03/06/16 1009 LBM:   Baseline Weight: Weight: 56.6 kg (124 lb 12.5 oz) Most recent weight: Weight: 54.7 kg (120 lb 9.5 oz)  Physical Exam: Frail, cachectic, eyes open, does not follow commands or track in the room              Additional Data Reviewed: No results for input(s): WBC, HGB, PLT, NA, BUN, CREATININE in the last 72 hours.  Invalid input(s): ALB   Problem List:  Patient Active Problem List   Diagnosis Date Noted  . Pressure ulcer 11/11/2015  . Urinary tract infectious disease   . OSA (obstructive sleep apnea) 11/05/2015  . Depression 11/05/2015  . TIA (transient ischemic attack) 11/05/2015  . CVA (cerebral infarction) 11/05/2015  . Essential hypertension 11/05/2015  . Cerebrovascular accident (CVA) ([place])   . Parkinson's disease ([place])   . Cervical dystonia   . Muscle spasms of neck   . Seizures ([place])   . General weakness   . Seizure ([place])   . Acute respiratory failure ([place])   . Acute respiratory failure with hypoxia ([place]) 10/15/2015  . Encephalopathy   . Endotracheal tube present   . Status epilepticus ([place]) 10/11/2015  . Acute encephalopathy 10/09/2015  . History of stroke 10/09/2015  .  Polypharmacy 10/09/2015  . Generalized ischemic cerebrovascular disease 10/09/2015  . Chronic abdominal pain 10/09/2015  . Delirium   . Numbness of foot   . Recurrent falls 05/21/2013  . Pre-syncope 05/20/2013  . Chronic daily headache 05/17/2013  . Essential hypertension, benign 02/01/2013  . Generalized anxiety disorder 01/17/2013  . GERD (gastroesophageal reflux disease) 01/17/2013  . Other and unspecified hyperlipidemia 01/17/2013  . Insomnia 01/17/2013  . Acute blood loss anemia 01/10/2013  . Spinal stenosis, lumbar region, with neurogenic claudication 01/07/2013  . Bowel habit changes 12/22/2011  . Hemorrhoids 12/22/2011  . PROLAPSE, RECTAL 07/11/2008  . SLEEP APNEA 07/11/2008  . RECTAL INCONTINENCE 07/11/2008  . COLONIC POLYPS, ADENOMATOUS, HX OF 07/11/2008     Palliative Care Assessment & Plan    Code Status:  DNR  Goals of Care: Extubation to comfort care done this AM. She tolerated this very well. Gave bolus of morphine and versed prior to extubation will titrate both infusions carefully. She will need to stay in ICU setting until we achieve steady states on the infusions. Will check on her throughout the day and adjust comfort medications as needed. Family at bedside.  Prognosis: Hours - Days Discharge Planning: Anticipated Hospital Death   Care plan was discussed with family.  Thank you for allowing the Palliative Medicine Team to assist in the care of this patient.   Time In: 10  Time Out: 10:35 Total Time 35 min Prolonged Time Billed no    Greater than 50%  of this time was spent counseling and coordinating care related to the above assessment and plan.   Acquanetta Chain, DO  03/06/2016, 10:09 AM  Please contact Palliative Medicine Team phone at [phone] for questions and concerns.

## 2015-11-30 NOTE — Progress Notes (Addendum)
PULMONARY / CRITICAL CARE MEDICINE   Name: ANNEBELLE FICHTER MRN: [id number] DOB: 17-Jan-1931    ADMISSION DATE:  11/04/2015 CONSULTATION DATE:  11/09/15  REFERRING MD:  Neurology / Dr. Nicole Kindred   CHIEF COMPLAINT: Seizure   Brief History:  [name] with PMH of CVA and recent admission for status epilepticus 2/6-3/4 readmitted 3/5 for repeat seizure activity.  Despite AED's, the patient continues to have seizures on continuous EEG.  PCCM called for elective intubation for deep sedation to control seizures.   SUBJECTIVE:  One-way extubation in the AM on 3/30. Patient resting comfortably in bed with family at bedside. No apparent distress.  REVIEW OF SYSTEMS:  Unable to obtain given altered mentation.  VITAL SIGNS: BP 183/88 mmHg  Pulse 97  Temp(Src) 98 F (36.7 C) (Oral)  Resp 19  Ht 5\' 3"  (1.6 m)  Wt 120 lb 9.5 oz (54.7 kg)  BMI 21.37 kg/m2  SpO2 100%  VENTILATOR SETTINGS:    INTAKE / OUTPUT: I/O last 3 completed shifts: In: 69 [I.V.:200; IV Piggyback:290] Out: 775 [Urine:775]  PHYSICAL EXAMINATION: General: Eyes closed. Resting comfortably. Family at bedside. Integument:  Warm & dry. No rash on exposed skin. Neurological:  Opened eyes briefly to my voice. Currently on Morphine & Versed infusions. Pulmonary:  Normal work of breathing on room air. No evidence of distress.  LABS:  BMET  Recent Labs Lab 11/27/15 0327 11/27/15 1955 11/28/15 0551  NA 139 139 138  K 2.9* 4.2 3.5  CL 104 105 104  CO2 26 24 24   BUN 17 16 19   CREATININE 0.31* 0.33* 0.35*  GLUCOSE 120* 111* 117*   Electrolytes  Recent Labs Lab 11/26/15 0508 11/27/15 0327 11/27/15 1955 11/28/15 0230 11/28/15 0551  CALCIUM 9.6 9.9 9.7  --  9.9  MG 1.8 1.5*  --  1.9  --   PHOS 3.1 2.8  --   --  3.4   CBC  Recent Labs Lab 11/26/15 0508 11/27/15 0327 11/28/15 0551  WBC 5.7 6.6 6.5  HGB 8.9* 9.8* 9.7*  HCT 27.8* 29.9* 30.6*  PLT 305 273 275   Coag's No results for input(s): APTT, INR in the  last 168 hours.  Sepsis Markers  Recent Labs Lab 11/26/15 1510  LATICACIDVEN 0.7   ABG No results for input(s): PHART, PCO2ART, PO2ART in the last 168 hours.  Liver Enzymes  Recent Labs Lab 11/26/15 1510 11/27/15 0327 11/28/15 0551  AST 35  --   --   ALT 32  --   --   ALKPHOS 68  --   --   BILITOT 0.4  --   --   ALBUMIN 2.5* 2.5* 2.6*   Cardiac Enzymes No results for input(s): TROPONINI, PROBNP in the last 168 hours.  Glucose  Recent Labs Lab 11/27/15 1122 11/27/15 1607 11/27/15 1952 11/27/15 2310 11/28/15 0343 11/28/15 0759  GLUCAP 140* 122* 95 113* 111* 121*   Imaging No results found.  STUDIES:  3/10 Continuous EEG >> Left hemispheric PLEDS were present with maximum negativity in the left posterotemporal/parietal cortex, although less developed, and less organized essentially unchanged compare to day 3 of recording. There were no subclinical seizures. 3/13 continueous EEG >> burst suppression pattern but no seizure focus 3/20 EEG >>occasional, although infrequent, discharges arising out of the left mid temporal region - no electrographic seizures.  3/23 EEG >> no seizures  MICROBIOLOGY: MRSA PCR 3/13:  Negative Blood Ctx x2 3/6:  Negative Urine Ctx 3/6:  Enterococcus HIV 3/6:  Negative Influenza PCR 3/6:  Negative   ANTIBIOTICS: Aztreonam 3/6 - 3/6  Fortaz 3/6 - 3/8 Vanco 3/6 - 3/12  SIGNIFICANT EVENTS: 3/04  Discharged after episode of status epilepticus 3/05  Readmitted  LINES/TUBES: PIV x2 Foley 3/5>>> OETT 7.5 3/10 - 3/30 OGT 3/10 - 3/30 L IJ CVL 3/11 - ?  ASSESSMENT / PLAN:  82 year old female with status epilepticus and acute encephalopathy. Patient transitioned to full comfort care and terminally extubated on 3/30. Palliative medicine continuing to follow.   1. Comfort Care/Palliation:  Patient currently on Morphine & Versed drips for comfort. Palliative Medicine following and directing further care. Continuing Foley Catheter for  comfort. 2. Acute Encephalopathy:  Patient comfortable. Secondary to Status Epilepticus. 3. Status Epilepticus:  Continuing IV Vimpat & Keppra to suppress seizures for patient's comfort. 4. Disposition:  Pending continued survival may transition to inpatient hospice setting but will defer to the Palliative Medicine Team.  Sonia Baller. Ashok Cordia, M.D. [facility] Pulmonary [facility] Critical [facility] Pager:  [phone] After 3pm or if no response, call [phone] 11/30/2015, 11:31 AM

## 2015-11-30 NOTE — Progress Notes (Signed)
Palliative Medicine RN Note: Pt has required prn versed and robinul. Son and daughter in law at bedside. They agree that comfort is goal, and they verbalize understanding that their goals would be best met at inpt hospice unit. Referred to [facility] at their request; [facility] is second choice. Faxed information and left message for unit SW that referral has been done.  Larina Earthly, RN, BSN, [facility] 11/30/2015 12:48 PM Cell [phone] 8:00-4:00 Monday-Friday Office [phone]

## 2015-12-01 NOTE — Clinical Social Work Note (Signed)
Clinical Social Worker followed up with [employer] who states that patient is on the list for an in person assessment.  Facility liaison hopeful to come and assess today, however nothing guaranteed.  [facility] remains full at this time and per MD, transfer pending survival.  CSW to follow up with patient family to further discuss discharge options.  Barbette Or, LCSW (Covering weekend [phone]) [phone]

## 2015-12-01 NOTE — Consult Note (Signed)
[facility]--Met with pt's son Kariann Wecker and his wife Manuela Schwartz.  Discussed care at [place] at [facility].  Reviewed philosophy of comfort care, team approach to care, 2 levels-of-care and financial subsidy.  Son and DIL are both in agreement that comfort care the primary goal for pt.  Due to pt's complicated seizure history, it is the family's understanding that she will continue to require both ongoing Morphine and Versed continuous infusions as well as IV bolus bags of both Vimpat and Keppra.  After much discussion with [facility]'s medical director Dr Adline Peals and NP-Jerry Ofilia Neas there is a shared concern on the part of both providers that [facility] could not maintain the IVPB infusions of Keppra and Vimpat.  We certainly would be able to offer the Versed and Morphine continuous infusions.  As a result, our medical team does not feel it to be in the patient's best interest to offer a bed for transfer to [facility] out of concern that the equilibrium that has been achieved may be disrupted and result in further seizure activity.  If pt should reach a place where IV Vimpat and Keppra boluses could be weaned off then [facility] could certainly re-evaluate pt for admission at [facility].  Thank you for this referral and the opportunity to meet this fine family.  Wynetta Fines, RN  [phone] ([phone] (main office))

## 2015-12-01 NOTE — Procedures (Signed)
History: Kathryn [name] is an 82 y.o. female patient with altered mental status, h/o seizures. Routine inpatient EEG was performed for further evaluation.   Patient Active Problem List   Diagnosis Date Noted  . Pressure ulcer 11/11/2015  . Urinary tract infectious disease   . OSA (obstructive sleep apnea) 11/05/2015  . Depression 11/05/2015  . TIA (transient ischemic attack) 11/05/2015  . CVA (cerebral infarction) 11/05/2015  . Essential hypertension 11/05/2015  . Cerebrovascular accident (CVA) ([place])   . Parkinson's disease ([place])   . Cervical dystonia   . Muscle spasms of neck   . Seizures ([place])   . General weakness   . Seizure ([place])   . Acute respiratory failure ([place])   . Acute respiratory failure with hypoxia ([place]) 10/15/2015  . Encephalopathy   . Endotracheal tube present   . Status epilepticus ([place]) 10/11/2015  . Acute encephalopathy 10/09/2015  . History of stroke 10/09/2015  . Polypharmacy 10/09/2015  . Generalized ischemic cerebrovascular disease 10/09/2015  . Chronic abdominal pain 10/09/2015  . Delirium   . Numbness of foot   . Recurrent falls 05/21/2013  . Pre-syncope 05/20/2013  . Chronic daily headache 05/17/2013  . Essential hypertension, benign 02/01/2013  . Generalized anxiety disorder 01/17/2013  . GERD (gastroesophageal reflux disease) 01/17/2013  . Other and unspecified hyperlipidemia 01/17/2013  . Insomnia 01/17/2013  . Acute blood loss anemia 01/10/2013  . Spinal stenosis, lumbar region, with neurogenic claudication 01/07/2013  . Bowel habit changes 12/22/2011  . Hemorrhoids 12/22/2011  . PROLAPSE, RECTAL 07/11/2008  . SLEEP APNEA 07/11/2008  . RECTAL INCONTINENCE 07/11/2008  . COLONIC POLYPS, ADENOMATOUS, HX OF 07/11/2008     Current facility-administered medications:  .  0.9 %  sodium chloride infusion, , Intravenous, Continuous, Chesley Mires, MD, Last Rate: 10 mL/hr at 11/28/15 2000 .  acetaminophen (TYLENOL) tablet 650 mg, 650 mg, Oral, Q6H PRN  **OR** acetaminophen (TYLENOL) suppository 650 mg, 650 mg, Rectal, Q6H PRN, Acquanetta Chain, DO .  antiseptic oral rinse (BIOTENE) solution 15 mL, 15 mL, Topical, PRN, Acquanetta Chain, DO .  bisacodyl (DULCOLAX) suppository 10 mg, 10 mg, Rectal, Daily PRN, Donita Brooks, NP, 10 mg at 11/13/15 1027 .  glycopyrrolate ([name]) tablet 1 mg, 1 mg, Oral, Q4H PRN **OR** glycopyrrolate ([name]) injection 0.2 mg, 0.2 mg, Subcutaneous, Q4H PRN **OR** glycopyrrolate ([name]) injection 0.2 mg, 0.2 mg, Intravenous, Q4H PRN, Acquanetta Chain, DO, 0.2 mg at 11/30/15 1252 .  lacosamide (VIMPAT) 100 mg in sodium chloride 0.9 % 25 mL IVPB, 100 mg, Intravenous, Q12H, Kerney Elbe, MD, 100 mg at 11/30/15 2240 .  levETIRAcetam (KEPPRA) 1,000 mg in sodium chloride 0.9 % 100 mL IVPB, 1,000 mg, Intravenous, Q12H, Shruthi Northrup Fuller Mandril, MD, 1,000 mg at 12/01/15 0933 .  midazolam (VERSED) 50 mg in sodium chloride 0.9 % 50 mL (1 mg/mL) infusion, 1 mg/hr, Intravenous, Continuous, Acquanetta Chain, DO, Last Rate: 1 mL/hr at 12/01/15 0932, 1 mg/hr at 12/01/15 0932 .  midazolam (VERSED) bolus via infusion 2-4 mg, 2-4 mg, Intravenous, Q30 min PRN, Acquanetta Chain, DO, 2 mg at 12/01/15 0603 .  morphine 250 mg in sodium chloride 0.9 % 250 mL (1 mg/mL) infusion, 1-5 mg/hr, Intravenous, Continuous, Acquanetta Chain, DO, Last Rate: 2 mL/hr at 11/30/15 1430, 2 mg/hr at 11/30/15 1430 .  morphine bolus via infusion 2 mg, 2 mg, Intravenous, Q10 min PRN, Acquanetta Chain, DO .  polyvinyl alcohol (LIQUIFILM TEARS) 1.4 % ophthalmic solution 1 drop, 1 drop,  Both Eyes, QID PRN, Acquanetta Chain, DO   Introduction:  This is a 19 channel routine scalp EEG performed at the bedside with bipolar and monopolar montages arranged in accordance to the international 10/20 system of electrode placement. One channel was dedicated to EKG recording.   Findings:  Generalized background slowing in the range of 6-7 Hz . No  definite evidence of abnormal epileptiform discharges or electrographic seizures were noted during this recording.   Impression:  Abnormal routine inpatient EEG suggestive of encephalopathy. Clinical correlation is recommended .

## 2015-12-01 NOTE — Progress Notes (Signed)
PULMONARY / CRITICAL CARE MEDICINE   Name: Kathryn [name] MRN: [id number] DOB: [birth year]    ADMISSION DATE:  11/04/2015 CONSULTATION DATE:  11/09/15  REFERRING MD:  Neurology / Dr. Nicole Kindred   CHIEF COMPLAINT: Seizure   Brief History:  [name] with PMH of CVA and recent admission for status epilepticus 2/6-3/4 readmitted 3/5 for repeat seizure activity.  Despite AED's, the patient continues to have seizures on continuous EEG.  PCCM called for elective intubation for deep sedation to control seizures.   SUBJECTIVE:  Comfortable on ms/ativan drips    VITAL SIGNS: BP 155/53 mmHg  Pulse 102  Temp(Src) 97.5 F (36.4 C) (Axillary)  Resp 15  Ht _0  (1.6 m)  Wt 54.7 kg (120 lb 9.5 oz)  BMI 21.37 kg/m2  SpO2 92%    INTAKE / OUTPUT: I/O last 3 completed shifts: In: 201.4 [I.V.:56.4; IV Piggyback:145] Out: 1800 [Urine:1800]  PHYSICAL EXAMINATION: General: Eyes closed. Resting comfortably. Family at bedside. Integument:  Warm & dry. No rash on exposed skin. Neurological:  Opened eyes very  briefly voice but blank stare.  Pulmonary:  Normal work of breathing on room air. No evidence of distress.  LABS:  BMET  Recent Labs Lab 11/27/15 0327 11/27/15 1955 11/28/15 0551  NA 139 139 138  K 2.9* 4.2 3.5  CL 104 105 104  CO2 _1 BUN _2 CREATININE 0.31* 0.33* 0.35*  GLUCOSE 120* 111* 117*   Electrolytes  Recent Labs Lab 11/26/15 0508 11/27/15 0327 11/27/15 1955 11/28/15 0230 11/28/15 0551  CALCIUM 9.6 9.9 9.7  --  9.9  MG 1.8 1.5*  --  1.9  --   PHOS 3.1 2.8  --   --  3.4   CBC  Recent Labs Lab 11/26/15 0508 11/27/15 0327 11/28/15 0551  WBC 5.7 6.6 6.5  HGB 8.9* 9.8* 9.7*  HCT 27.8* 29.9* 30.6*  PLT 305 273 275   Coag's No results for input(s): APTT, INR in the last 168 hours.  Sepsis Markers  Recent Labs Lab 11/26/15 1510  LATICACIDVEN 0.7   ABG No results for input(s): PHART, PCO2ART, PO2ART in the last 168 hours.  Liver  Enzymes  Recent Labs Lab 11/26/15 1510 11/27/15 0327 11/28/15 0551  AST 35  --   --   ALT 32  --   --   ALKPHOS 68  --   --   BILITOT 0.4  --   --   ALBUMIN 2.5* 2.5* 2.6*   Cardiac Enzymes No results for input(s): TROPONINI, PROBNP in the last 168 hours.  Glucose  Recent Labs Lab 11/27/15 1122 11/27/15 1607 11/27/15 1952 11/27/15 2310 11/28/15 0343 11/28/15 0759  GLUCAP 140* 122* 95 113* 111* 121*   Imaging No results found.  STUDIES:  3/10 Continuous EEG >> Left hemispheric PLEDS were present with maximum negativity in the left posterotemporal/parietal cortex, although less developed, and less organized essentially unchanged compare to day 3 of recording. There were no subclinical seizures. 3/13 continueous EEG >> burst suppression pattern but no seizure focus 3/20 EEG >>occasional, although infrequent, discharges arising out of the left mid temporal region - no electrographic seizures.  3/23 EEG >> no seizures  MICROBIOLOGY: MRSA PCR 3/13:  Negative Blood Ctx x2 3/6:  Negative Urine Ctx 3/6:  Enterococcus HIV 3/6:  Negative Influenza PCR 3/6:  Negative   ANTIBIOTICS: Aztreonam 3/6 - 3/6  Fortaz 3/6 - 3/8 Vanco 3/6 - 3/12  SIGNIFICANT EVENTS: 3/04  Discharged after  episode of status epilepticus 3/05  Readmitted  LINES/TUBES: PIV x2 Foley 3/5>>> OETT 7.5 3/10 - 3/30 OGT 3/10 - 3/30 L IJ CVL 3/11 -    ASSESSMENT / PLAN:  82 year old female with status epilepticus and acute encephalopathy. Patient transitioned to full comfort care and terminally extubated on 3/30. Palliative medicine continuing to follow.   1. Comfort Care/Palliation:  Patient currently on Morphine & Versed drips for comfort. Palliative Medicine following and directing further care. Continuing Foley Catheter for comfort. 2. Acute Encephalopathy:  Patient comfortable. Secondary to Status Epilepticus. 3. Status Epilepticus:  Continuing IV Vimpat & Keppra to suppress seizures for  patient's comfort. 4. Disposition:  Pending continued survival may transition to inpatient hospice setting but will defer to the Palliative Medicine Team.   Discussed with fm at bedside/ comfort goals met on present rx    Christinia Gully, MD Pulmonary and [place] [phone] After 5:30 PM or weekends, call [phone]

## 2015-12-02 NOTE — Progress Notes (Signed)
PULMONARY / CRITICAL CARE MEDICINE   Name: Kathryn [name] MRN: [id number] DOB: 05-30-1931    ADMISSION DATE:  11/04/2015 CONSULTATION DATE:  11/09/15  REFERRING MD:  Neurology / Dr. Nicole Kindred   CHIEF COMPLAINT: Seizure   Brief History:  [name] with PMH of CVA and recent admission for status epilepticus 2/6-3/4 readmitted 3/5 for repeat seizure activity.  Despite AED's, the patient continues to have seizures on continuous EEG.  PCCM called for elective intubation for deep sedation to control seizures.   SUBJECTIVE:  Comfortable on ms/ativan drips/ no fm at bedside    VITAL SIGNS: BP 168/82 mmHg  Pulse 103  Temp(Src) 100.2 F (37.9 C) (Axillary)  Resp 18  Ht 5' 3"  (1.6 m)  Wt 120 lb 9.5 oz (54.7 kg)  BMI 21.37 kg/m2  SpO2 92%    INTAKE / OUTPUT: I/O last 3 completed shifts: In: 352.1 [I.V.:62.1; IV Piggyback:290] Out: 800 [Urine:800]  PHYSICAL EXAMINATION: General: Eyes closed. Resting comfortably.   Integument:  Warm & dry. No rash on exposed skin. Neurological:  No longer opens eyes to verbal, pos 0 sign  Pulmonary:  Normal work of breathing on room air. No evidence of distress.  LABS:  BMET  Recent Labs Lab 11/27/15 0327 11/27/15 1955 11/28/15 0551  NA 139 139 138  K 2.9* 4.2 3.5  CL 104 105 104  CO2 26 24 24   BUN 17 16 19   CREATININE 0.31* 0.33* 0.35*  GLUCOSE 120* 111* 117*   Electrolytes  Recent Labs Lab 11/26/15 0508 11/27/15 0327 11/27/15 1955 11/28/15 0230 11/28/15 0551  CALCIUM 9.6 9.9 9.7  --  9.9  MG 1.8 1.5*  --  1.9  --   PHOS 3.1 2.8  --   --  3.4   CBC  Recent Labs Lab 11/26/15 0508 11/27/15 0327 11/28/15 0551  WBC 5.7 6.6 6.5  HGB 8.9* 9.8* 9.7*  HCT 27.8* 29.9* 30.6*  PLT 305 273 275   Coag's No results for input(s): APTT, INR in the last 168 hours.  Sepsis Markers  Recent Labs Lab 11/26/15 1510  LATICACIDVEN 0.7   ABG No results for input(s): PHART, PCO2ART, PO2ART in the last 168 hours.  Liver  Enzymes  Recent Labs Lab 11/26/15 1510 11/27/15 0327 11/28/15 0551  AST 35  --   --   ALT 32  --   --   ALKPHOS 68  --   --   BILITOT 0.4  --   --   ALBUMIN 2.5* 2.5* 2.6*   Cardiac Enzymes No results for input(s): TROPONINI, PROBNP in the last 168 hours.  Glucose  Recent Labs Lab 11/27/15 1122 11/27/15 1607 11/27/15 1952 11/27/15 2310 11/28/15 0343 11/28/15 0759  GLUCAP 140* 122* 95 113* 111* 121*   Imaging No results found.  STUDIES:  3/10 Continuous EEG >> Left hemispheric PLEDS were present with maximum negativity in the left posterotemporal/parietal cortex, although less developed, and less organized essentially unchanged compare to day 3 of recording. There were no subclinical seizures. 3/13 continueous EEG >> burst suppression pattern but no seizure focus 3/20 EEG >>occasional, although infrequent, discharges arising out of the left mid temporal region - no electrographic seizures.  3/23 EEG >> no seizures  MICROBIOLOGY: MRSA PCR 3/13:  Negative Blood Ctx x2 3/6:  Negative Urine Ctx 3/6:  Enterococcus HIV 3/6:  Negative Influenza PCR 3/6:  Negative   ANTIBIOTICS: Aztreonam 3/6 - 3/6  Fortaz 3/6 - 3/8 Vanco 3/6 - 3/12  SIGNIFICANT EVENTS: 3/04  Discharged after episode of status epilepticus 3/05  Readmitted  LINES/TUBES: PIV x2 Foley 3/5>>> OETT 7.5 3/10 - 3/30 OGT 3/10 - 3/30 L IJ CVL 3/11   ASSESSMENT / PLAN:  82 year old female with status epilepticus and acute encephalopathy. Patient transitioned to full comfort care and terminally extubated on 3/30. Palliative medicine continuing to follow.   1. Comfort Care/Palliation:  Patient currently on Morphine & Versed drips for comfort. Palliative Medicine following and directing further care. Continuing Foley Catheter for comfort. 2. Acute Encephalopathy:  Patient comfortable. Secondary to Status Epilepticus. 3. Status Epilepticus:  Continuing IV Vimpat & Keppra to suppress seizures for patient's  comfort. 4. Disposition:  Pending continued survival may transition to inpatient hospice setting but will defer to the Palliative Medicine Team.   Discussed with  Nursing at bedside/ comfort goals met on present rx    Christinia Gully, MD Pulmonary and [place] [phone] After 5:30 PM or weekends, call [phone]

## 2015-12-02 NOTE — Progress Notes (Signed)
Patient resting comfortably with a very moist congested cough.  RN administered [name] Q4 throughout the day, no coughing or moist breathing noted by RN or CNA. RN contacting MD to ask about scheduling [name] vs current PRN.

## 2015-12-02 NOTE — Progress Notes (Signed)
Per [employer], no beds available today.  Bernita Raisin, [place] Social Work [phone]

## 2015-12-03 ENCOUNTER — Inpatient Hospital Stay (HOSPITAL_COMMUNITY)
Admission: AD | Admit: 2015-12-03 | Discharge: 2015-12-31 | DRG: 101 | Disposition: E | Source: Ambulatory Visit | Attending: Internal Medicine | Admitting: Internal Medicine

## 2015-12-03 DIAGNOSIS — Z515 Encounter for palliative care: Secondary | ICD-10-CM | POA: Diagnosis present

## 2015-12-03 DIAGNOSIS — I10 Essential (primary) hypertension: Secondary | ICD-10-CM | POA: Diagnosis not present

## 2015-12-03 DIAGNOSIS — G40901 Epilepsy, unspecified, not intractable, with status epilepticus: Secondary | ICD-10-CM | POA: Diagnosis not present

## 2015-12-03 DIAGNOSIS — F329 Major depressive disorder, single episode, unspecified: Secondary | ICD-10-CM | POA: Diagnosis present

## 2015-12-03 DIAGNOSIS — G20 Parkinson's disease: Secondary | ICD-10-CM | POA: Diagnosis present

## 2015-12-03 DIAGNOSIS — Z8673 Personal history of transient ischemic attack (TIA), and cerebral infarction without residual deficits: Secondary | ICD-10-CM | POA: Diagnosis not present

## 2015-12-03 DIAGNOSIS — G40911 Epilepsy, unspecified, intractable, with status epilepticus: Principal | ICD-10-CM | POA: Diagnosis present

## 2015-12-03 DIAGNOSIS — R569 Unspecified convulsions: Secondary | ICD-10-CM | POA: Diagnosis not present

## 2015-12-03 MED ORDER — GLYCOPYRROLATE 0.2 MG/ML IJ SOLN
0.1000 mg | Freq: Three times a day (TID) | INTRAMUSCULAR | Status: DC
  Administered 2015-12-03 – 2015-12-05 (×6): 0.1 mg via INTRAVENOUS
  Filled 2015-12-03 (×6): qty 1

## 2015-12-03 MED ORDER — MIDAZOLAM BOLUS VIA INFUSION
2.0000 mg | INTRAVENOUS | Status: DC | PRN
  Filled 2015-12-03: qty 2

## 2015-12-03 MED ORDER — SODIUM CHLORIDE 0.9 % IV SOLN
500.0000 mg | Freq: Two times a day (BID) | INTRAVENOUS | Status: DC
  Administered 2015-12-03 – 2015-12-06 (×6): 500 mg via INTRAVENOUS
  Filled 2015-12-03 (×7): qty 5

## 2015-12-03 MED ORDER — SODIUM CHLORIDE 0.9 % IV SOLN
5.0000 mg/h | INTRAVENOUS | Status: DC
  Administered 2015-12-04: 2 mg/h via INTRAVENOUS
  Filled 2015-12-03: qty 10

## 2015-12-03 MED ORDER — SODIUM CHLORIDE 0.9 % IV SOLN
1.0000 mg/h | INTRAVENOUS | Status: DC
  Administered 2015-12-05: 2 mg/h via INTRAVENOUS
  Filled 2015-12-03 (×2): qty 10

## 2015-12-03 MED ORDER — MORPHINE BOLUS VIA INFUSION
2.0000 mg | INTRAVENOUS | Status: DC | PRN
  Administered 2015-12-04 – 2015-12-05 (×4): 2 mg via INTRAVENOUS
  Filled 2015-12-03 (×5): qty 2

## 2015-12-03 MED ORDER — SODIUM CHLORIDE 0.9 % IV SOLN
100.0000 mg | Freq: Two times a day (BID) | INTRAVENOUS | Status: DC
  Administered 2015-12-03 – 2015-12-06 (×6): 100 mg via INTRAVENOUS
  Filled 2015-12-03 (×13): qty 10

## 2015-12-03 NOTE — Clinical Social Work Note (Signed)
CSW signing off as patient now under GIP care.  Kathryn [name], [facility]: [phone] Surgical: [phone]

## 2015-12-03 NOTE — Care Management Note (Addendum)
Case Management Note  Patient Details  Name: Kathryn [name] MRN: [id number] Date of Birth: 1931-05-16  Subjective/Objective:                    Action/Plan: Referral for GIP from Dr Hilma Favors . Dr Hilma Favors willing to be attending . Spoke with patient's daughter in law Kathryn [name] . Choice provided , [facility] is the family's first choice , if they are unable to provide service Kathryn [name] wants to try [facility] . Referral given to [facility] at [facility] . Kathryn [name] will check on staffing and call CM back. Awaiting call back.  [place] again at [place] , she should know shortly if they can provide service. Awaiting back back   Expected Discharge Date:                  Expected Discharge Plan:     In-House Referral:     Discharge planning Services  CM Consult  Post Acute Care Choice:    Choice offered to:     DME Arranged:    DME Agency:     HH Arranged:    HH Agency:     Status of Service:  In process, will continue to follow  Medicare Important Message Given:  Yes Date Medicare IM Given:    Medicare IM give by:    Date Additional Medicare IM Given:    Additional Medicare Important Message give by:     If discussed at [place] of Stay Meetings, dates discussed:    Additional Comments:  Kathryn Favre, RN 12/13/2015, 11:41 AM

## 2015-12-03 NOTE — Care Management Important Message (Signed)
Important Message  Patient Details  Name: Kathryn [name] MRN: [id number] Date of Birth: 12-20-1930   Medicare Important Message Given:  Yes    Jaxton Casale P [place] 12/21/2015, 1:41 PM

## 2015-12-03 NOTE — Progress Notes (Signed)
I have discharged and readmitted patient under new hospice account. Medications already removed from discharged encounter- reordered in GIP encounter.   Lane Hacker, DO Palliative Medicine

## 2015-12-03 NOTE — Consult Note (Signed)
[facility]-Pt admitted under the Hospice Medicare Benefit effective today so Hospice will begin sponsoring pt's hospitalization with a focus on comfort at [facility].  Pt's Hospice dx is status epilepticus.  [place] was originally asked to evaluate pt for possible transfer to [place] at [facility] but after long consultation with Dr Estill Cotta, Medical Director, it was felt that pt's seizures could recur if IV Vimpat and IV Keppra were discontinued.  As a result, family and HOP Medical Director felt it would be in the pt's best interest not to try and relocate pt since IV piggyback Vimpat and Keppra were not available at [facility].  As a result, Son/HCPOA- Scarlette Slice and DIL-Susan met with Hospice today to initiate the Hospice sponsorship as a GIP pt.  Pt clearly meets GIP criteria due to her ongoing need for IV medications for seizure management that would be difficult to provide in another setting.  Scarlette Slice has elected for the following members of the Hospice team:  RN Tourist information centre manager, [place].  Randle's goals-of-care are for ongoing supportive care with a focus on symptom and seizure management.  Pt is of the [employer] and most recently attended [employer].  Family has chosen [facility] [employer] [facility] home in [place], [place] to take care of final arrangements.  For assessment today pt is verbally and physically unresponsive except for brief periods when she would open her eyes.  Pt has a regular snoring breathing pattern with puffing of lips upon expiration.  HR is irregular.  Lungs with rhonchi to all lung fields.  Abd soft and nontender.  She has a 14 french foley catheter draining cloudy, yellow urine.  She wears undergarments for incontinence of bowel.  Generalized skin is pale with silicone based dressings to bilat heals.  Pt is noted to have bilat foot drop.  Bilat arms are stiff and L arm appears contracted to her abd.  She has a NSL capped  to R forearm and PIV to R hand through which she is getting continuous infusions of Morphine and Versed.  During the assessment pt developed a tremor to L shoulder/UE that quickly abated once she was allowed to rest undisturbed.  Symptoms appear quite well-managed by the Palliative Care Team at this time .  No other symptom management recommendations to offer.  Will provide daily SN visits to reassess pt and SW and Chaplain will begin to offer support as well.  Please call [place] if there is a significant change in pt's condition or at TOD so that we can offer support to family and [facility] staff as indicated.  [phone]   Wynetta Fines, RN

## 2015-12-03 NOTE — Discharge Summary (Signed)
Physician Discharge Summary  Patient ID: Kathryn [name] MRN: [id number] DOB/AGE: 80/08/1931 80 y.o.  Admit date: 11/04/2015 Discharge date: 12/15/2015    Discharge Diagnoses:  Status Epilepticus and acute Encephalopathy                                                                       DISCHARGE PLAN BY DIAGNOSIS  Currently she is requiring three drugs to control seizure activity. Vimpat, versed and keppra. Due to the severity of her status epilepticus a decision has been made along with family to not attempt to discontinue the seizure medications. They do not want her to have a seizure this close to EOL.  Recommed GIP Admission (Hosp[facility] in Place) - Palliative will serve as attending once transition has been made. Due to seizures and IV anti-seizure medications she cannot be transported to a hospice facility.               DISCHARGE SUMMARY   Kathryn [name] is a 82 y.o. y/o female with a PMH of  Depression, TIA, CVA, Parkinson's diosease, seizure, HTN, OSA, Hypothyroidism, Polypharmacy, frequent falls was  Brought on 3/6 to [facility] with focal seizures and ongoing fever. Patient was treated withbroad spectrum antibiotics. Patient was placed on continuous EEG.  She did not appear to improve in her awareness and cognitive ability over multiple attempts over time.  EEG was concerning for left hemispheric PLEDS and worsening seizure activity of non conclusive status Epilepticus. Patient was  Electively intubated on 3/10 per Neurology recommendation to sedate the patient. Despite increased dose of antiepileptics and sedation patient continued to have seizures. Patient was in Hypothermia on 3/11 and she was on bear huggar warming blankets.  Despite of all the treatment measures patient continued to deteriorate. Family on 3/29 decided that they would like her to be comfort measures only.  On 3/30 patient was extubated and palliative care was contacted. Family was concerned regarding the seizure activity that she  has, therefore it was not in the best interest of the patient to be transferred to [place] as the care will be interrupted.               SIGNIFICANT DIAGNOSTIC STUDIES 3/10 Continuous EEG >> Left hemispheric PLEDS were present with maximum negativity in the left posterotemporal/parietal cortex, although less developed, and less organized essentially unchanged compare to day 3 of recording  There were no subclinical seizures   SIGNIFICANT EVENTS 3/04 Discharged after episode of status epilepticus 3/05 Readmitted   MICRO DATA  HIV 3/6 >> negative  BCx2 3/6 >>  UC 3/6 >> enterococcus >> pan sensitive    ANTIBIOTICS Aztreonam 3/6 >> 3/6  Fortaz 3/6 >> 3/8 Vanco 3/6 >>   CONSULTS 3/6 >Neurology 3/10> Pulmonary 3/17> WOC 3/30> Palliative care 4/1> Hospice  TUBES / LINES ETT 3/10 >>3/30   Discharge Exam:  Gen: Elderly lady, comfortable, minimally responsive. Neuro: Eyes closed. Does not follow commands. Heart: RRR. Lungs: Resps shallow.  Clear bilaterally. MSK: no deformities.    Filed Vitals:   12/01/15 0605 12/01/15 1244 12/02/15 0457 12/16/2015 0609  BP: 155/53  168/82 163/55  Pulse: 102 105 103 89  Temp: 97.5 F (36.4 C) 100.2 F (37.9 C) 100.2  F (37.9 C) 98.9 F (37.2 C)  TempSrc: Axillary Axillary Axillary Axillary  Resp: 15 15 18 12   Height:      Weight:      SpO2: 92% 94% 92% 97%     Discharge Labs  BMET  Recent Labs Lab 11/27/15 0327 11/27/15 1955 11/28/15 0230 11/28/15 0551  NA 139 139  --  138  K 2.9* 4.2  --  3.5  CL 104 105  --  104  CO2 26 24  --  24  GLUCOSE 120* 111*  --  117*  BUN 17 16  --  19  CREATININE 0.31* 0.33*  --  0.35*  CALCIUM 9.9 9.7  --  9.9  MG 1.5*  --  1.9  --   PHOS 2.8  --   --  3.4    CBC  Recent Labs Lab 11/27/15 0327 11/28/15 0551  HGB 9.8* 9.7*  HCT 29.9* 30.6*  WBC 6.6 6.5  PLT 273 275    Anti-Coagulation No results for input(s): INR in the last 168  hours.          Medication List    ASK your doctor about these medications        amantadine 100 MG capsule  Commonly known as:  SYMMETREL  Take 1 capsule (100 mg total) by mouth 2 (two) times daily.     amLODipine 5 MG tablet  Commonly known as:  NORVASC  Take 5 mg by mouth daily.     aspirin EC 81 MG tablet  Take 81 mg by mouth daily.     baclofen 10 MG tablet  Commonly known as:  LIORESAL  Take 1 tablet (10 mg total) by mouth at bedtime.     baclofen 10 MG tablet  Commonly known as:  LIORESAL  Take 0.5 tablets (5 mg total) by mouth daily after breakfast.     calcium-vitamin D 500-200 MG-UNIT tablet  Take 1 tablet by mouth daily.     estradiol 0.0375 MG/24HR  Commonly known as:  VIVELLE-DOT  Place 0.5 patches onto the skin 2 (two) times a week. Sunday and Wed     fluticasone 50 MCG/ACT nasal spray  Commonly known as:  FLONASE  Place 2 sprays into the nose daily as needed for allergies.     levETIRAcetam 500 MG tablet  Commonly known as:  KEPPRA  Take 1 tablet (500 mg total) by mouth 2 (two) times daily.     losartan 100 MG tablet  Commonly known as:  COZAAR  Take 100 mg by mouth daily.     nystatin-triamcinolone cream  Commonly known as:  MYCOLOG II  Apply 1 application topically 2 (two) times daily.     pantoprazole 40 MG tablet  Commonly known as:  PROTONIX  Take 1 tablet (40 mg total) by mouth every morning.     PROBIOTIC DAILY PO  Take 1 tablet by mouth daily.     propranolol 40 MG tablet  Commonly known as:  INDERAL  Take 40 mg by mouth 3 (three) times daily. 8am, 2pm, 8pm     sennosides 8.8 MG/5ML syrup  Commonly known as:  SENOKOT  Take 5 mLs by mouth 2 (two) times daily as needed for mild constipation.     simvastatin 40 MG tablet  Commonly known as:  ZOCOR  Take 40 mg by mouth every morning.     vitamin B-12 100 MCG tablet  Commonly known as:  CYANOCOBALAMIN  Take 100 mcg by  mouth daily.     vitamin C 500 MG tablet  Commonly  known as:  ASCORBIC ACID  Take 500 mg by mouth daily.     vitamin E 200 UNIT capsule  Take 200 Units by mouth daily.         Disposition:  GIP Hospice  Discharged Condition:  Kathryn [name] is terminally ill with very poor prognosis therefore family withdrew care on her and decided to make her comfort care.  Time spent on disposition:  Greater than 45 minutes.     [place]     Baltazar Apo, MD, PhD 12/16/2015, 5:12 PM [place] [facility] [phone] or if no answer [phone]

## 2015-12-03 NOTE — Progress Notes (Signed)
Daily Progress Note   Patient Name: Kathryn [name]       Date: 12/13/2015 DOB: 1931-07-11  Age: 82 y.o. MRN#: [id number] Attending Physician: Javier Glazier, MD Primary Care Physician: Lilian Coma, MD Admit Date: 11/04/2015  Reason for Consultation/Follow-up: Establishing goals of care and Terminal Care  Subjective: Reyhana is much weaker today. Not opening eyes. Decreased UOP. No agitation.  Interval Events: Intubated for airway protection 3/5 and again on 3/27. Transitioned to comfort care on 3/29  Length of Stay: 28 days  Current Medications: Scheduled Meds:  . lacosamide (VIMPAT) IV  100 mg Intravenous Q12H  . levETIRAcetam  1,000 mg Intravenous Q12H    Continuous Infusions: . sodium chloride 10 mL/hr at 11/28/15 2000  . midazolam (VERSED) infusion 1 mg/hr (12/19/2015 1006)  . morphine 2 mg/hr (11/30/15 1430)    PRN Meds: acetaminophen **OR** acetaminophen, antiseptic oral rinse, bisacodyl, glycopyrrolate **OR** glycopyrrolate **OR** glycopyrrolate, midazolam, morphine, polyvinyl alcohol  Physical Exam: Physical Exam              Vital Signs: BP 163/55 mmHg  Pulse 89  Temp(Src) 98.9 F (37.2 C) (Axillary)  Resp 12  Ht 5\' 3"  (1.6 m)  Wt 54.7 kg (120 lb 9.5 oz)  BMI 21.37 kg/m2  SpO2 97% SpO2: SpO2: 97 % O2 Device: O2 Device: Not Delivered O2 Flow Rate: O2 Flow Rate (L/min): 3 L/min  Intake/output summary:  Intake/Output Summary (Last 24 hours) at 12/23/2015 1059 Last data filed at 12/04/2015 0600  Gross per 24 hour  Intake [id number]  Output    125 ml  Net [id number] ml   LBM: Last BM Date: 11/25/15 Baseline Weight: Weight: 56.6 kg (124 lb 12.5 oz) Most recent weight: Weight: 54.7 kg (120 lb 9.5 oz)       Palliative Assessment/Data: Flowsheet Rows     Most Recent Value   Intake Tab    Referral Department  Critical care   Unit at Time of Referral  ICU   Palliative Care Primary Diagnosis  Neurology   Date Notified  11/26/15   Palliative Care Type  New Palliative care   Reason for referral  End of Life Care Assistance   Date of Admission  11/04/15   Date first seen by Palliative Care  12/27/15   # of days Palliative  referral response time  31 Day(s)   # of days IP prior to Palliative referral  22   Clinical Assessment    Palliative Performance Scale Score  10%   Pain Max last 24 hours  0   Pain Min Last 24 hours  0   Dyspnea Max Last 24 Hours  0   Dyspnea Min Last 24 hours  0   Nausea Max Last 24 Hours  0   Nausea Min Last 24 Hours  0   Anxiety Max Last 24 Hours  0   Anxiety Min Last 24 Hours  0   Other Max Last 24 Hours  0   Psychosocial & Spiritual Assessment    Palliative Care Outcomes    Patient/Family meeting held?  Yes   Palliative Care Outcomes  Changed to focus on comfort, Provided psychosocial or spiritual support, Provided end of life care assistance, Counseled regarding hospice, Clarified goals of care   Patient/Family wishes: Interventions discontinued/not started   Mechanical Ventilation      Additional Data Reviewed: CBC    Component Value Date/Time   WBC 6.5 11/28/2015 0551   RBC 3.19* 11/28/2015 0551   HGB 9.7* 11/28/2015 0551   HCT 30.6* 11/28/2015 0551   PLT 275 11/28/2015 0551   MCV 95.9 11/28/2015 0551   MCH 30.4 11/28/2015 0551   MCHC 31.7 11/28/2015 0551   RDW 15.4 11/28/2015 0551   LYMPHSABS 1.1 11/28/2015 0551   MONOABS 1.0 11/28/2015 0551   EOSABS 0.2 11/28/2015 0551   BASOSABS 0.0 11/28/2015 0551    CMP     Component Value Date/Time   NA 138 11/28/2015 0551   K 3.5 11/28/2015 0551   CL 104 11/28/2015 0551   CO2 24 11/28/2015 0551   GLUCOSE 117* 11/28/2015 0551   BUN 19 11/28/2015 0551   CREATININE 0.35* 11/28/2015 0551   CALCIUM 9.9 11/28/2015 0551   PROT 6.0* 11/26/2015 1510    ALBUMIN 2.6* 11/28/2015 0551   AST 35 11/26/2015 1510   ALT 32 11/26/2015 1510   ALKPHOS 68 11/26/2015 1510   BILITOT 0.4 11/26/2015 1510   GFRNONAA >60 11/28/2015 0551   GFRAA >60 11/28/2015 0551       Problem List:  Patient Active Problem List   Diagnosis Date Noted  . Pressure ulcer 11/11/2015  . Urinary tract infectious disease   . OSA (obstructive sleep apnea) 11/05/2015  . Depression 11/05/2015  . TIA (transient ischemic attack) 11/05/2015  . CVA (cerebral infarction) 11/05/2015  . Essential hypertension 11/05/2015  . Cerebrovascular accident (CVA) ([place])   . Parkinson's disease ([place])   . Cervical dystonia   . Muscle spasms of neck   . Seizures ([place])   . General weakness   . Seizure ([place])   . Acute respiratory failure ([place])   . Acute respiratory failure with hypoxia ([place]) 10/15/2015  . Encephalopathy   . Endotracheal tube present   . Status epilepticus ([place]) 10/11/2015  . Acute encephalopathy 10/09/2015  . History of stroke 10/09/2015  . Polypharmacy 10/09/2015  . Generalized ischemic cerebrovascular disease 10/09/2015  . Chronic abdominal pain 10/09/2015  . Delirium   . Numbness of foot   . Recurrent falls 05/21/2013  . Pre-syncope 05/20/2013  . Chronic daily headache 05/17/2013  . Essential hypertension, benign 02/01/2013  . Generalized anxiety disorder 01/17/2013  . GERD (gastroesophageal reflux disease) 01/17/2013  . Other and unspecified hyperlipidemia 01/17/2013  . Insomnia 01/17/2013  . Acute blood loss anemia 01/10/2013  . Spinal stenosis, lumbar region,  with neurogenic claudication 01/07/2013  . Bowel habit changes 12/22/2011  . Hemorrhoids 12/22/2011  . PROLAPSE, RECTAL 07/11/2008  . SLEEP APNEA 07/11/2008  . RECTAL INCONTINENCE 07/11/2008  . COLONIC POLYPS, ADENOMATOUS, HX OF 07/11/2008     Palliative Care Assessment & Plan    1.Code Status: DNR  Advance Directive Documentation        Most Recent Value   Type of Advance Directive  Out  of facility DNR (pink MOST or yellow form)   Pre-existing out of facility DNR order (yellow form or pink MOST form)  Yellow form placed in chart (order not valid for inpatient use)   "MOST" Form in Place?         2. Goals of Care/Additional Recommendations:  Currently she is requiring three drugs to control seizure activity. Vimpat, versed and keppra. Due to the severity of her status epilepticus a decision has been made along with family to not attempt to discontinue the seizure medications. They do not want her to have a seizure this close to EOL.  Seizure medications limit ability for her to transfer to hospice house therefore I am willing to transition her to [facility] hospice in place.  Limitations on Scope of Treatment: Full Comfort Care  Desire for further Chaplaincy support:yes  Psycho-social Needs: Compassionate Geneticist, molecular, Education on Hospice and Grief/Bereavement Support  3. Symptom Management: 1.Refractory Seizures/Status Epilepticus: Vent dependent with compassionate wean done on 3/30.Now on Versed, Keppra and Vimpat.   2. Secretions, requiring scheduled robinul.  4. Palliative Prophylaxis:   Aspiration, Bowel Regimen, Frequent Pain Assessment, Oral Care and Turn Reposition  5. Prognosis: Hours - Days  6. Discharge Planning:  Recommed GIP Admission (Hospice in Place) - Palliative will serve as attending once transition has been made. Due to seizures and IV anti-seizure medications she cannot be transported to a hospice facility.  Care plan was discussed with patient's sister, and DIL Manuela Schwartz.  Thank you for allowing the Palliative Medicine Team to assist in the care of this patient.   Time In: 1030 Time Out: 11:05 Total Time 35 Prolonged Time Billed no        Acquanetta Chain, DO  12/05/2015, 10:59 AM  Please contact Palliative Medicine Team phone at [phone] for questions and concerns.

## 2015-12-04 NOTE — Progress Notes (Signed)
Nutrition Brief Note  Chart reviewed. Pt now transitioning to comfort care.  No further nutrition interventions warranted at this time.  Please re-consult as needed.   Joby Richart A. Xaiver Roskelley, RD, LDN, CDE Pager: [phone] After hours Pager: [phone]  

## 2015-12-04 NOTE — Consult Note (Signed)
[facility]--T 101.8 ax, HR 100, RR 20.  Pt noted upon arrival at bedside to have facial flushing. Skin temp quite warm to touch.  Pt continues to be nonverbal, non responsive, except to intermittently open eyes briefly.  She appears peaceful with no overt s/s of pain/discomfort.  HR elevated today and irregular.  Respiratory effort appears weaker than yesterday.  Staff RN in to administer IV [name] for pulmonary congestion.  Call to son-Randle and his wife Manuela Schwartz was able to join the conversation over speaker phone.  Discussed the s/s of advancing dying process with fever, tachycardia, diminished respiratory effort, decreasing urine output, etc.  Offered emotional support to [place].  Answered their questions.  Will continue daily visits to reassess pt and offer support to the family.  Please call [place] at [phone] at time of death or if we can be of any assistance.  Thank you Wynetta Fines, RN

## 2015-12-04 NOTE — Progress Notes (Signed)
Palliative Medicine RN Note: Pt is in bed, eyes open, RR near 30. RN CJ will give morphine bolus. Smell of urine is strong; RN reports foley bag is leaking. PMT RN changed foley bag and secured tubing with new stat lock; 75 ml dark yellow, foul smelling urine discarded. Notified RN that pt has a blister to right inner thigh. Updated Dr Hilma Favors. Called DIL Manuela Schwartz [phone]); provided update. She verbalized gratitude for the care pt is getting and that she is very happy with how comfortable the pt is. Plan for PMT to see tomorrow and call family with update. Larina Earthly, RN, BSN, [facility] 12/04/2015 1:52 PM Cell [phone] 8:00-4:00 Monday-Friday Office [phone]

## 2015-12-05 MED ORDER — GLYCOPYRROLATE 0.2 MG/ML IJ SOLN
0.4000 mg | Freq: Four times a day (QID) | INTRAMUSCULAR | Status: DC | PRN
  Administered 2015-12-06: 0.4 mg via INTRAVENOUS

## 2015-12-05 MED ORDER — GLYCOPYRROLATE 0.2 MG/ML IJ SOLN
0.4000 mg | Freq: Three times a day (TID) | INTRAMUSCULAR | Status: DC
  Administered 2015-12-05 – 2015-12-06 (×3): 0.4 mg via INTRAVENOUS
  Filled 2015-12-05 (×3): qty 2

## 2015-12-05 MED ORDER — GLYCOPYRROLATE 0.2 MG/ML IJ SOLN
0.4000 mg | INTRAMUSCULAR | Status: AC
  Administered 2015-12-05: 0.4 mg via INTRAVENOUS
  Filled 2015-12-05: qty 2

## 2015-12-05 MED ORDER — MORPHINE BOLUS VIA INFUSION
4.0000 mg | INTRAVENOUS | Status: DC | PRN
  Filled 2015-12-05: qty 4

## 2015-12-05 MED ORDER — MIDAZOLAM BOLUS VIA INFUSION
3.0000 mg | INTRAVENOUS | Status: DC | PRN
  Filled 2015-12-05: qty 3

## 2015-12-05 NOTE — Progress Notes (Signed)
PMT RN: Afternoon pt check. Secretions are not controlled. Obtained new orders to increase [name]. Plan f/u tomorrow am by member of PMT.  Larina Earthly, RN, BSN, [facility] 12/05/2015 3:59 PM Cell [phone] 8:00-4:00 Monday-Friday Office [phone]

## 2015-12-05 NOTE — Progress Notes (Signed)
PMT RN Note: Pt is resting with eyes closed. No s/s distress, relaxed. Hospice SW is at bedside meeting with pt's son and DIL. Pt's mouth open and very dry. Attempted to provide mouth care, but pt clamped down on swab; it had to be removed by gently pulling on it. Plan for PMT member to visit again this afternoon.  Larina Earthly, RN, BSN, [facility] 12/05/2015 9:34 AM Cell [phone] 8:00-4:00 Monday-Friday Office [phone]

## 2015-12-06 DIAGNOSIS — G40901 Epilepsy, unspecified, not intractable, with status epilepticus: Secondary | ICD-10-CM

## 2015-12-06 MED ORDER — SODIUM CHLORIDE 0.9 % IV SOLN
3.0000 mg/h | INTRAVENOUS | Status: DC
  Filled 2015-12-06: qty 10

## 2015-12-31 NOTE — Discharge Summary (Signed)
Physician Discharge Summary  Patient ID: Kathryn [name] MRN: [id number] DOB/AGE: 04-18-1931 82 y.o.  Admit date: 12/05/2015 Discharge date: 12/24/2015  Admission Diagnoses:  Discharge Diagnoses:  Active Problems:   Status epilepticus [facility])  Hospital Course: Kathryn [name] was an 82 y.o. y/o woman with a PMH of Depression, TIA, CVA, Parkinson's disease and frequent falls originally admitted on 3/6 to [facility] with new onset focal seizures and fever. She required intubation for airway protection in setting of worsening seizure activity. Despite increased dose of antiepileptics and sedation the patient continued to deteriorate. Family on 3/29 decided that they would like her to be comfort measures only. On 3/30 patient was extubated and palliative care was contacted for terminal care. Given refractory seizures it was determined she was too unstable to move out of the hospital and she was admitted under GIP hospice care with HOTP. Palliative Care team assumed her care.   She was managed until EOL with continuous infusion of morphine, versed and maintained on IV Keppra, Vimpat to control seizure activity.   She expired on 2015-12-31 at 12/26/36. Death Summary was completed at bedside. Pronounced by myself and RN. Provided support to the family.  Consults: Palliative Care, Neurology, Critical Care Medicine, Neurosurgery  Significant Diagnostic Studies: EEG, MRI, CT Head  Disposition: 20-Expired  Time Total: 35 minutes Time: 1900-1935  Signed: Rontavious Albright 12/24/2015, 8:48 AM

## 2015-12-31 NOTE — Progress Notes (Signed)
Wasted 67.5cc of morphine and versed=10cc wasted, witness by Evangeline Dakin and Skipper Cliche

## 2015-12-31 NOTE — Progress Notes (Signed)
MD, Douglass Rivers, notified of patient's passing away. Family in room. Dr. Hilma Favors said she will sign the death certificate. Passed on this info. To night nurse.

## 2015-12-31 NOTE — Consult Note (Signed)
[facility]--Daily SN visit made to bedside and pt appears to have transitioned to the active dying stage.  She has more effortful respiratory pattern with a resp rate 30 bpm.  Morphine and Versed infusions have been titrated up.  Skin remains quite warm to touch.  Call to son-Randle to update him on imminent dying process and emotional support provided.  HOP SW was able to visit and support family yesterday.  RN will continue daily visits for support.  Please call HOP at time of death or if we can assist in any way.  [phone]

## 2015-12-31 NOTE — Progress Notes (Signed)
Palliative Medicine RN f/u: patient with dry, cracked mouth. Cheyne Stokes with 15 seconds of apnea. Hot to touch to hands. Attempted mouth care; pt fought this and bit down on sponge. Family refused acetaminophen suppository, as pt would not have liked PR meds; removed blanket. Discussed s/s decline r/t resp and temp changes with family (DIL's sister). She verbalized understanding and appreciation for PMT, [facility], and [facility]OP care. Plan f/u by member of PMT later today, as patient's symptom management needs are changing several times a day. Family and RN Lauran have contact information for PMT RN.  Larina Earthly, RN, BSN, [facility] Dec 21, 2015 9:16 AM Cell [phone] 8:00-4:00 Monday-Friday Office [phone]

## 2015-12-31 NOTE — Consult Note (Signed)
Late Entry--[facility]--Nursing visit made on 12/05/15 at 5pm.  Pt resting comfortably with easy unlabored respirations.  She had just had her hair washed prior to visit.  IV Morphine and Versed continues along with Q 12 hr dosing of IV Vimpat and Keppra.  Call placed to son-Randle for update on pt's status and to provide emotional support.  Will continue daily f/u.  Please call [facility] at time of death or if we could assist pt in any way.  [phone]  Wynetta Fines, RN

## 2015-12-31 NOTE — Progress Notes (Signed)
Palliative Medicine RN Note: Pt with RR 24+. Respiratory effort increased since this am with some gasping respirations. Pt is very unstable at this time, and transport out of the hospital for any reason would cause physical harm to the patient d/t rapidly changing respiratory status. No family present. Dr Hilma Favors d/c Keppra d/t concerns about fluid overload. Will call DIL to discuss changes and pt status.  Larina Earthly, RN, BSN, [facility] 15-Dec-2015 3:47 PM Cell [phone] 8:00-4:00 Monday-Friday Office [phone]

## 2015-12-31 DEATH — deceased

## 2016-03-06 DIAGNOSIS — Z515 Encounter for palliative care: Secondary | ICD-10-CM | POA: Insufficient documentation

## 2016-03-06 NOTE — Consult Note (Addendum)
Consultation Note Date: 03/06/2016   Patient Name: Kathryn [name]  DOB: Mar 07, 1931  MRN: [id number]  Age / Sex: 82 y.o., female  PCP: Jonathon Jordan, MD Referring Physician: No att. providers found  Reason for Consultation: Establishing goals of care and Terminal Care  HPI/Patient Profile: 82 y.o. female  with past medical history of stroke admitted on 11/04/2015 with refractory seizures and status epilepticus she has required prolonged deep sedation and continues to have seizure activity on continuous EEG despite high doses of AEDs.   Clinical Assessment and Goals of Care: I met with patient's son Tommie Raymond and his wife Manuela Schwartz to discuss her goals of care-sh a specific advance directive that outlined she would not want to be maintained on life support if her chances for recovery were poor and if her QOL would be severely affected. Family were able to confidently discuss her wishes and a decision was made to extubate to comfort care in AM.  HCPOA- son Virdell Hoiland    SUMMARY OF RECOMMENDATIONS   Begin comfort measures while on the ventilator. Palliative team will assist with compassionate extubation in AM when family arrives.  Discharge Planning: Anticipated Hospital Death      Primary Diagnoses: Present on Admission:  . OSA (obstructive sleep apnea) . Depression . TIA (transient ischemic attack) . CVA (cerebral infarction) . Essential hypertension . Parkinson's disease ([place])  I have reviewed the medical record, interviewed the patient and family, and examined the patient. The following aspects are pertinent.  Past Medical History  Diagnosis Date  . Depression   . Hypertension   . High cholesterol   . Rectal prolapse   . Hx of adenomatous colonic polyps   . Internal hemorrhoids   . Hypothyroidism   . Fecal incontinence   . TIA (transient ischemic attack)   . History of frequent urinary tract  infections     recent  . Migraine   . Fatty liver 03/20/13  . Generalized ischemic cerebrovascular disease 10/09/2015  . Stroke [facility]) Sept 1, 2010; Sept 12, 2011   Social History   Social History  . Marital Status: Widowed    Spouse Name: N/A  . Number of Children: 1  . Years of Education: HS   Occupational History  . Retired    Social History Main Topics  . Smoking status: Never Smoker   . Smokeless tobacco: Never Used  . Alcohol Use: No  . Drug Use: No  . Sexual Activity: Not Asked   Other Topics Concern  . None   Social History Narrative   Patient lives at home alone.   Caffeine Use: quit in 1959   Family History  Problem Relation Age of Onset  . Stroke Father   . Migraines Father   . CVA Father   . Heart attack Father   . Hypertension Maternal Aunt     x3  . Colon cancer Neg Hx   . CVA Mother    Scheduled Meds: Continuous Infusions: PRN Meds:. Medications Prior to Admission:  Prior to  Admission medications   Not on File   Allergies  Allergen Reactions  . Augmentin [Amoxicillin-Pot Clavulanate] Swelling and Diarrhea    Throat   . Citalopram Swelling    Eyes ears and throat swelling Throat and eyes   . Codeine Anaphylaxis and Shortness Of Breath  . Fish-Derived Products Anaphylaxis  . Hyoscyamine Anaphylaxis and Swelling  . Ibuprofen Swelling    Throat and eyes   . Latex Swelling and Rash    Swelling to troat  . Lipitor [Atorvastatin] Swelling    Muscle aches throat  . Septra [Bactrim] Anaphylaxis  . Shellfish Allergy Anaphylaxis  . Miconazole Rash  . Keflet [Cephalexin] Diarrhea    Upset stomach  . Meloxicam Diarrhea and Nausea And Vomiting  . Verapamil Other (See Comments)    Tachycardia and flushing  . Vicodin [Hydrocodone-Acetaminophen] Other (See Comments)    stroke  . Zoloft [Sertraline Hcl] Swelling    Red spots all over face, swelling of tongue and legs.   . Ciprofloxacin Nausea And Vomiting    Other reaction(s): Abdominal Pain   . Depakene [Valproic Acid] Hives    All over body   . Isoptin Sr [Verapamil Hcl Er] Cough  . Lisinopril Cough    Fatigue and cough  . Sulfa Antibiotics Rash    Rash and also throat was tight  . Telmisartan-Hctz Cough   Review of Systems  Physical Exam  Vital Signs: BP 163/55 mmHg  Pulse 89  Temp(Src) 98.9 F (37.2 C) (Axillary)  Resp 12  Ht _0  (1.6 m)  Wt 54.7 kg (120 lb 9.5 oz)  BMI 21.37 kg/m2  SpO2 97% Pain Assessment: PAINAD   Pain Score: 0-No pain   SpO2: SpO2: 97 % O2 Device:SpO2: 97 % O2 Flow Rate: .O2 Flow Rate (L/min): 3 L/min  IO: Intake/output summary: No intake or output data in the 24 hours ending 03/06/16 1000  LBM: Last BM Date: 11/25/15 Baseline Weight: Weight: 56.6 kg (124 lb 12.5 oz) Most recent weight: Weight: 54.7 kg (120 lb 9.5 oz)     Palliative Assessment/Data:   Flowsheet Rows        Most Recent Value   Intake Tab    Referral Department  Critical care   Unit at Time of Referral  ICU   Palliative Care Primary Diagnosis  Neurology   Date Notified  11/26/15   Palliative Care Type  New Palliative care   Reason for referral  End of Life Care Assistance   Date of Admission  11/04/15   Date first seen by Palliative Care  11/26/15   # of days Palliative referral response time  0 Day(s)   # of days IP prior to Palliative referral  22   Clinical Assessment    Palliative Performance Scale Score  10%   Pain Max last 24 hours  0   Pain Min Last 24 hours  0   Dyspnea Max Last 24 Hours  0   Dyspnea Min Last 24 hours  0   Nausea Max Last 24 Hours  0   Nausea Min Last 24 Hours  0   Anxiety Max Last 24 Hours  0   Anxiety Min Last 24 Hours  0   Other Max Last 24 Hours  0   Psychosocial & Spiritual Assessment    Palliative Care Outcomes    Patient/Family meeting held?  Yes   Palliative Care Outcomes  Changed to focus on comfort, Provided psychosocial or spiritual support, Provided end of life  care assistance, Counseled regarding hospice,  Clarified goals of care   Patient/Family wishes: Interventions discontinued/not started   Mechanical Ventilation   Actual Discharge Date  12/02/2015 [GIP]      Time In: 900 Time Out: 1000  Time Total: 60 minutes Greater than 50%  of this time was spent counseling and coordinating care related to the above assessment and plan.  Signed by: Lane Hacker, DO   Please contact Palliative Medicine Team phone at [phone] for questions and concerns.  For individual provider: See Shea Evans

## 2016-09-03 IMAGING — MR MR HEAD WO/W CM
11 of 14 series · 29 of 48 positions shown · IV contrast (Yes)
Comparison: MRI brain 10/16/2015.

CLINICAL DATA: Encephalopathy. Seizures. Previous abnormal MRI of
the brain.

EXAM:
MRI HEAD WITHOUT AND WITH CONTRAST
TECHNIQUE: Multiplanar, multiecho pulse sequences of the brain and surrounding
structures were obtained without and with intravenous contrast.
CONTRAST:  1 MULTIHANCE GADOBENATE DIMEGLUMINE 529 MG/ML IV SOLN

[Series 2: FLAIR · sagittal · 5.0mm · 0.47mm/px · 1 of 23 slices shown (1 of 2)]
[im 1/23]
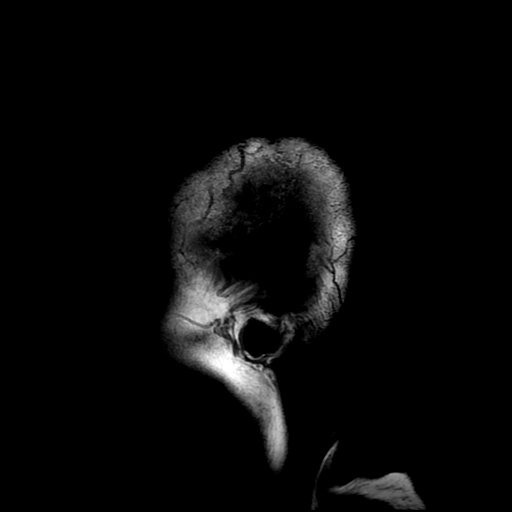

[Series 4: DWI · axial · 3.0mm · 0.94mm/px · z∈[-63,+72]mm · 5 of 91 slices shown (1 of 4)]
[im 1/91]
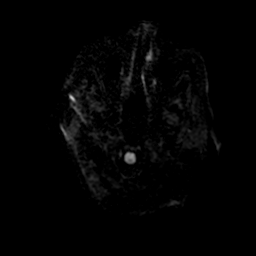
[im 23/91]
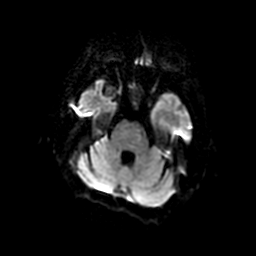
[im 46/91]
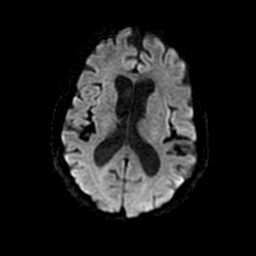
[im 68/91]
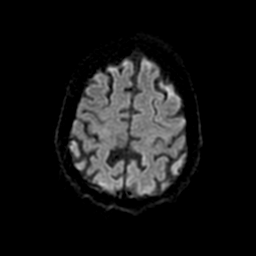
[im 91/91]
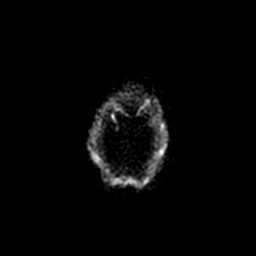

[Series 5: T2 · axial · 5.0mm · 0.47mm/px · z∈[-85,+58]mm · 2 of 25 slices shown (1 of 2)]
[im 1/25]
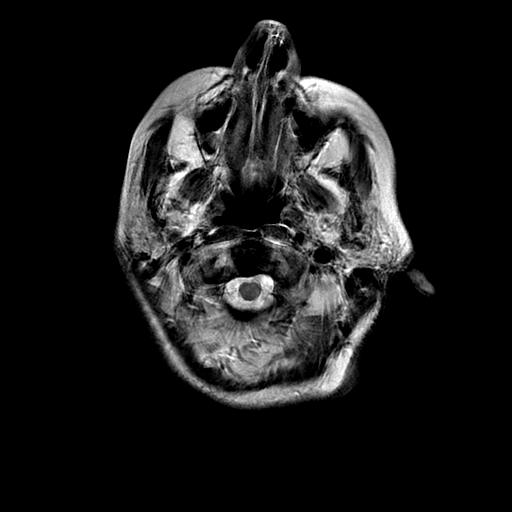
[im 25/25]
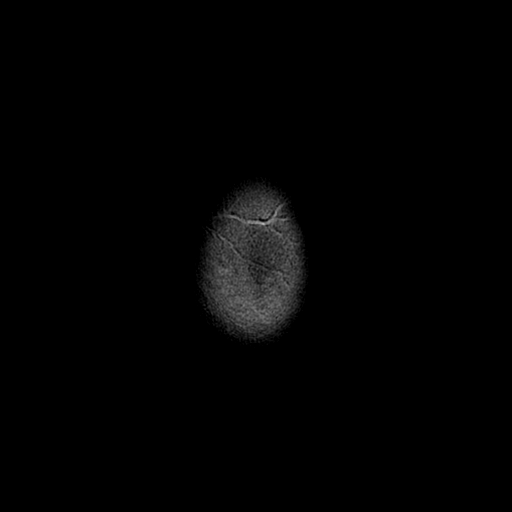

[Series 6: FLAIR · axial · 5.0mm · 0.47mm/px · z∈[-85,+58]mm · 2 of 25 slices shown (2 of 2)]
[im 1/25]
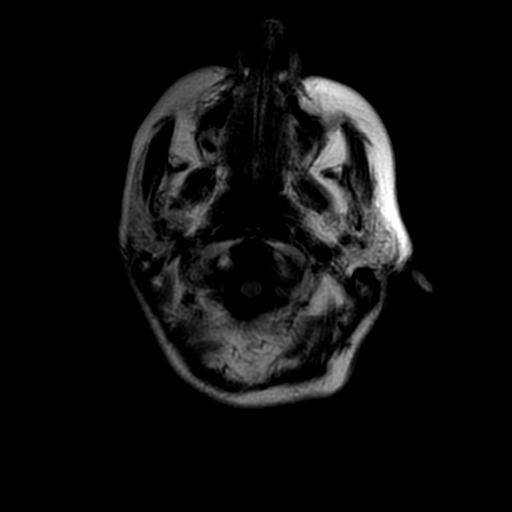
[im 25/25]
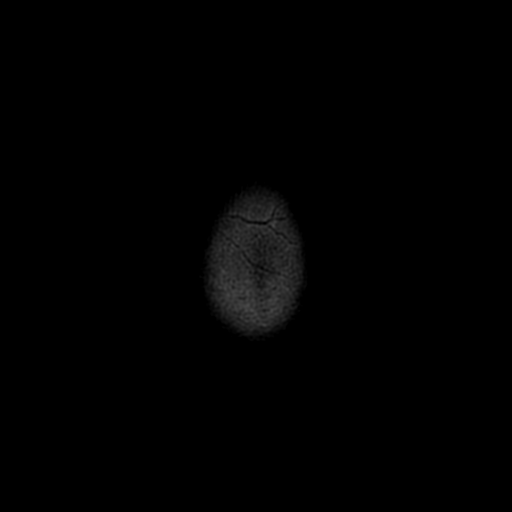

[Series 7: DWI · coronal · 4.0mm · 0.94mm/px · 5 of 72 slices shown (2 of 4)]
[im 1/72]
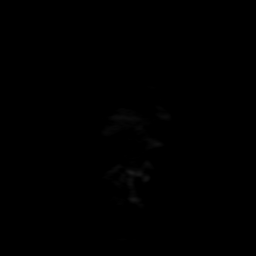
[im 18/72]
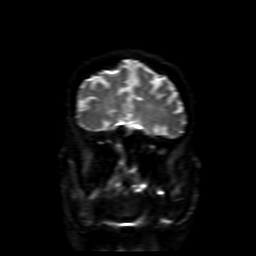
[im 36/72]
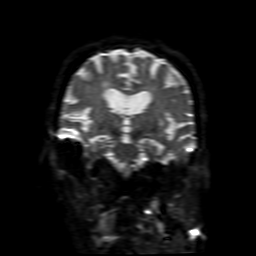
[im 54/72]
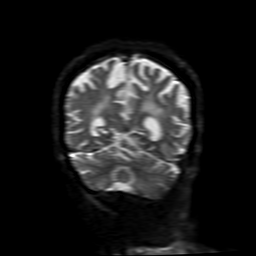
[im 72/72]
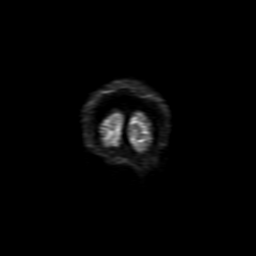

[Series 8: (person_name) · axial · 3.0mm · 0.47mm/px · z∈[-87,-30]mm · 3 of 100 slices shown]
[im 1/100]
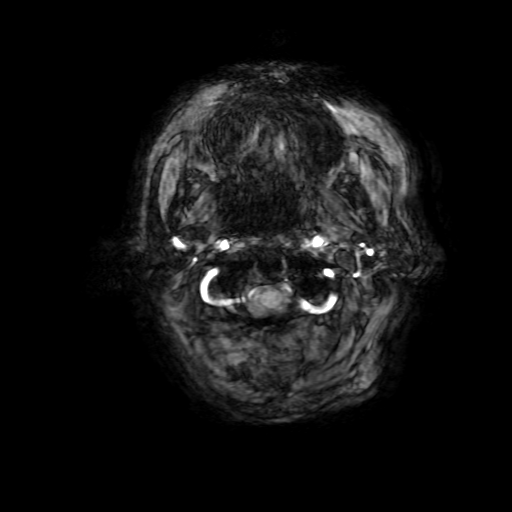
[im 20/100]
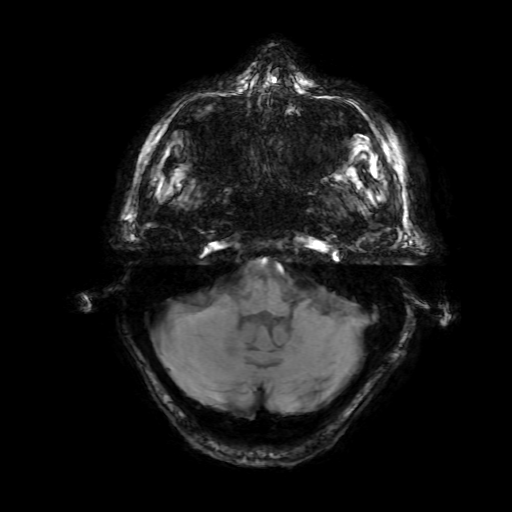
[im 40/100]
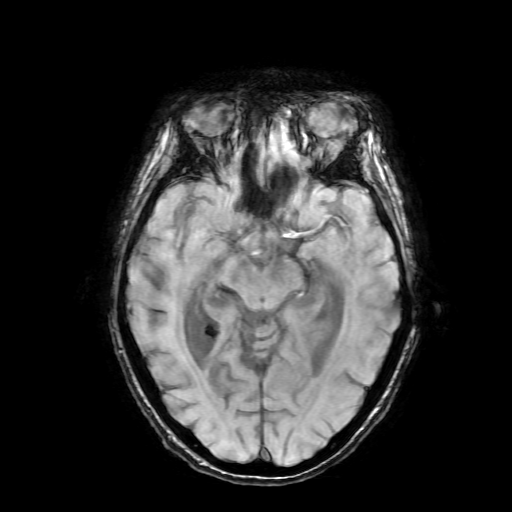

[Series 10: T2 fat-sat · oblique · 3.0mm · 0.43mm/px · 2 of 25 slices shown]
[im 1/25]
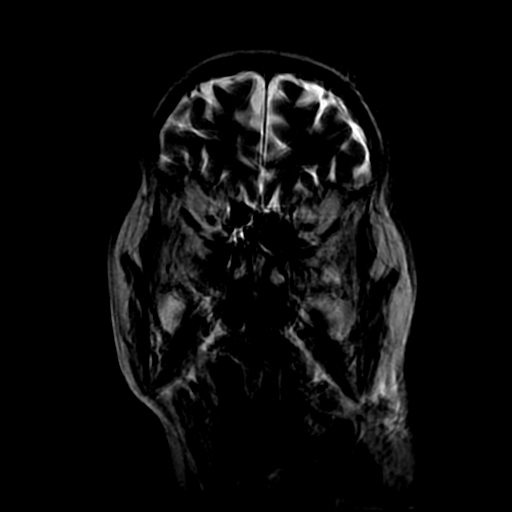
[im 25/25]
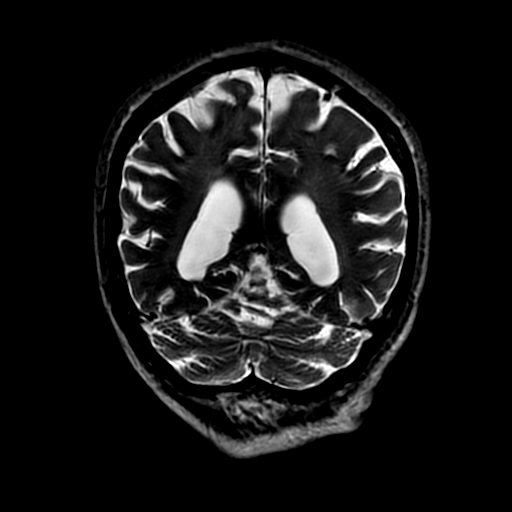

[Series 11: T2 · coronal · 5.0mm · 0.39mm/px · 2 of 28 slices shown (2 of 2)]
[im 1/28]
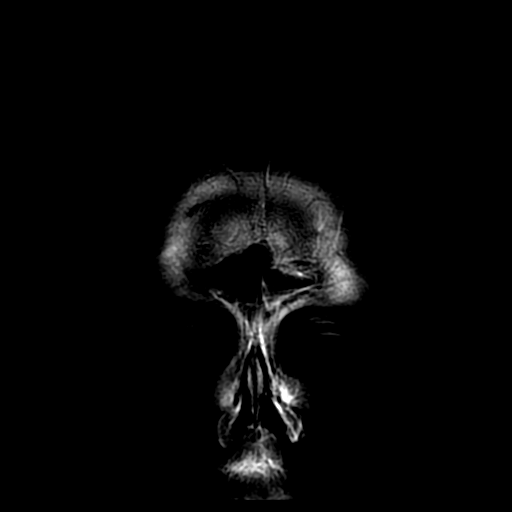
[im 28/28]
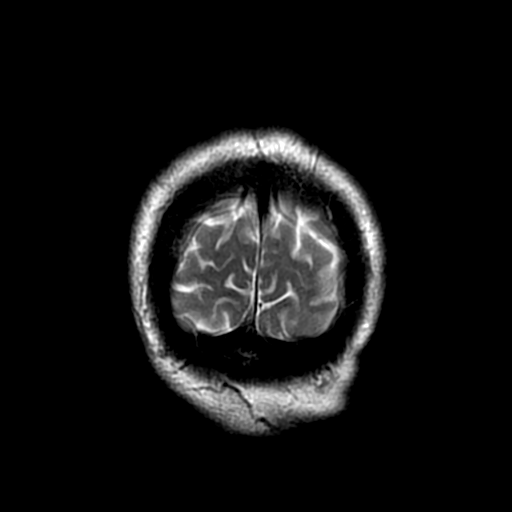

[Series 13: T1 · coronal · 5.0mm · 0.47mm/px · 2 of 30 slices shown]
[im 1/30]
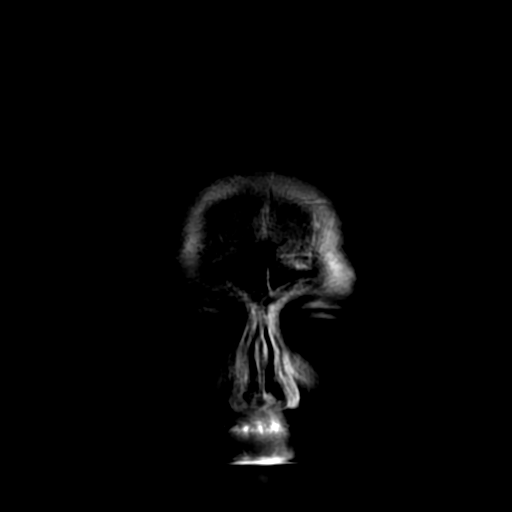
[im 30/30]
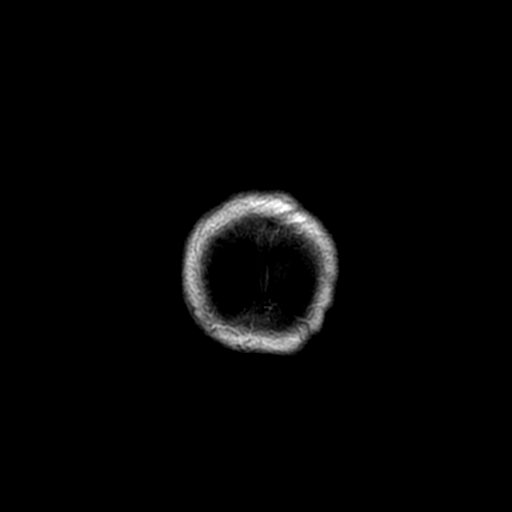

[Series 400: DWI · axial · 3.0mm · 0.94mm/px · z∈[-63,+72]mm · 3 of 46 slices shown (3 of 4)]
[im 1/46]
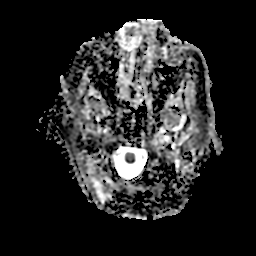
[im 23/46]
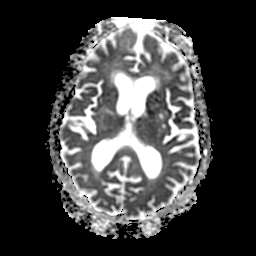
[im 46/46]
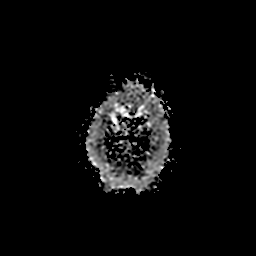

[Series 700: DWI · coronal · 4.0mm · 0.94mm/px · 2 of 36 slices shown (4 of 4)]
[im 1/36]
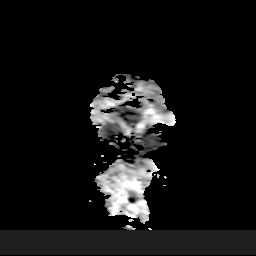
[im 36/36]
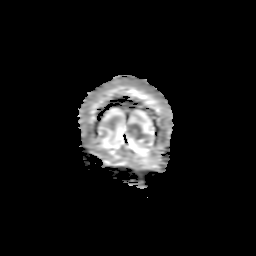

[29 of 48 positions shown; findings below may reference images not displayed]

FINDINGS: T2 changes within the posterior left thalamus have improved. There
is no longer restricted diffusion in the posterior left thalamus.

Previously noted cortical and subcortical T2 changes over the left
temporal and parietal lobe have resolved. The brain is more
atrophic. Remote lacunar infarcts of the basal ganglia and centrum
semi ovale are otherwise stable. Periventricular white matter
changes are similar to the prior exam.

White matter changes of the brainstem are stable. Cerebellum is
unremarkable.

The internal auditory canals are within normal limits bilaterally.
Bilateral lens replacements are noted. The globes and orbits are
intact.

A prominent fluid level is noted in the left sphenoid sinus.
Asymmetric anterior left ethmoid sinus disease is present. A small
fluid level is present in the left maxillary sinus. Bilateral
mastoid effusions are evident, left greater than right. No
obstructing nasopharyngeal lesion is present.

The postcontrast images demonstrate no pathologic enhancement.
IMPRESSION: 1. Interval decrease and T2 signal within the posterior left
thalamus and resolution of previously noted restricted diffusion.
2. Resolution of previously seen cortical and subcortical T2 changes
over the left temporal and parietal lobe.
3. The findings are compatible with resolution of previously noted
status epilepticus.
4. No other focal lesion to explain the patient's seizures.
5. Atrophy and white matter disease is otherwise noted. Cerebral
atrophy is similar to the studies from 2 years ago suggesting there
was some diffuse cerebral edema on the most recent study.
6. Diffuse sinus disease.

## 2016-09-05 IMAGING — CR DG CHEST 1V PORT
1 series · 1 of 1 positions shown · non-contrast
Comparison: 10/23/2015

CLINICAL DATA: Seizures. Coarse breath sounds. Fever. Altered
mental status.

EXAM:
PORTABLE CHEST 1 VIEW

[AP]
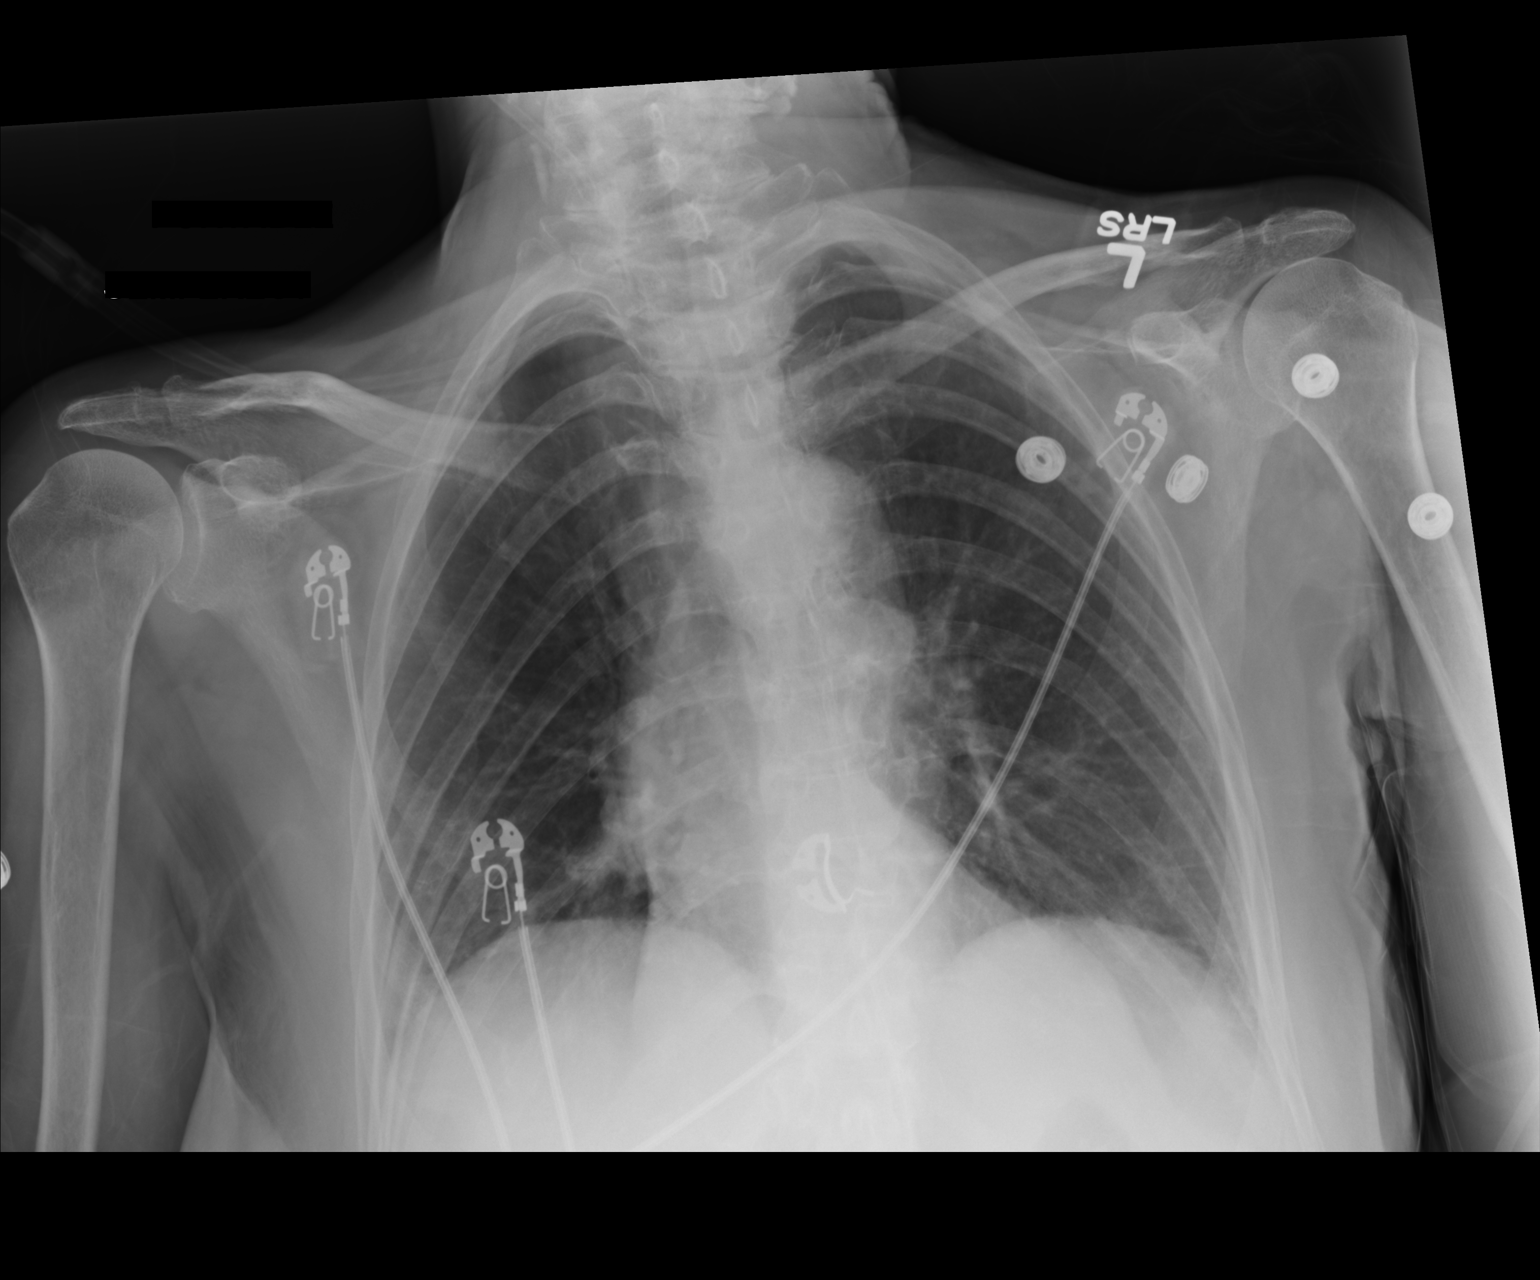

[1 of 1 positions shown; findings below may reference images not displayed]

FINDINGS: Interval removal of endotracheal tube, enteric tube, and right
central venous catheters. Shallow inspiration with slight
atelectasis in the lung bases. Normal heart size and pulmonary
vascularity. No focal consolidation in the lungs. No blunting of
costophrenic angles. No pneumothorax. Mediastinal contours appear
intact.
IMPRESSION: Shallow inspiration with slight atelectasis in the lung bases.

## 2016-09-06 IMAGING — CR DG CHEST 1V PORT
1 series · 1 of 1 positions shown · non-contrast
Comparison: Portable chest x-ray November 04, 2015

CLINICAL DATA: Fever, seizure activity.

EXAM:
PORTABLE CHEST 1 VIEW

[AP]
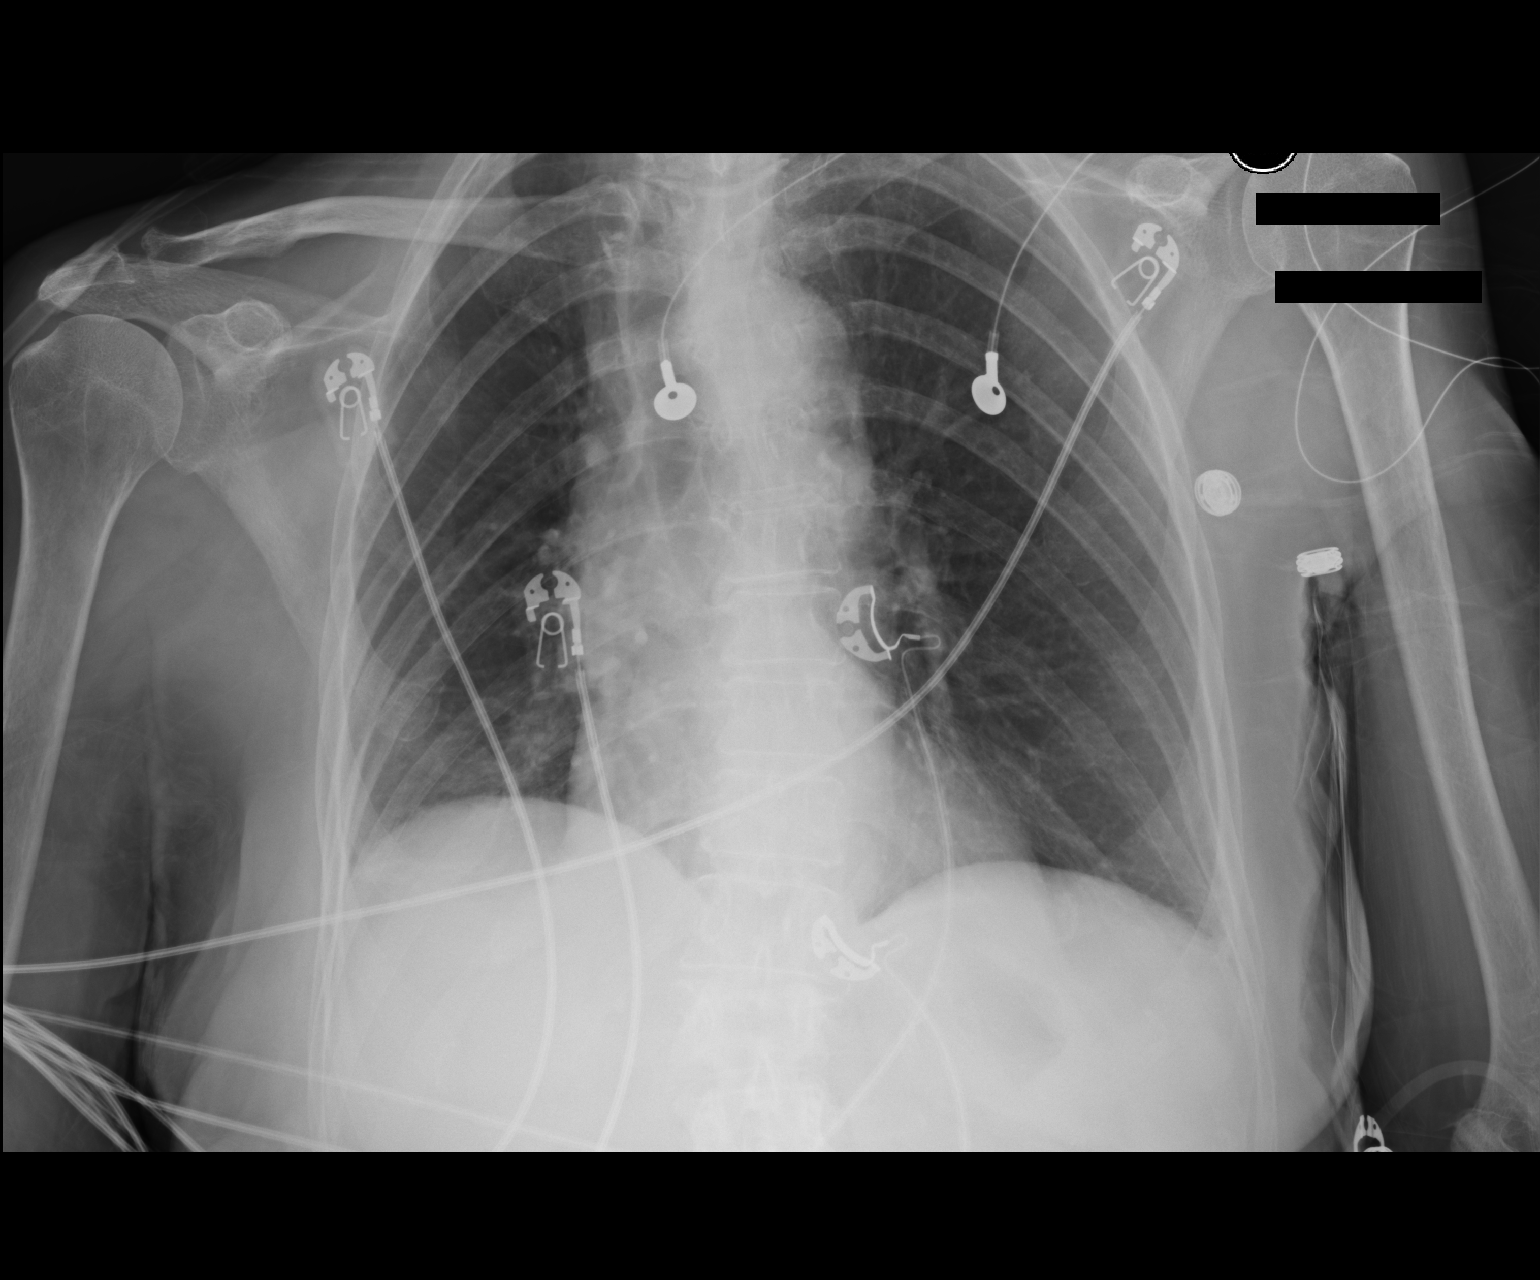

[1 of 1 positions shown; findings below may reference images not displayed]

FINDINGS: The lungs are better inflated today. There is persistent hazy
density in the right infrahilar region. No air bronchograms are
observed. There is no pleural effusion or pneumothorax. The left
lung is clear. The heart and pulmonary vascularity are normal. The
bony thorax is unremarkable.
IMPRESSION: Minimal atelectasis or early pneumonia in the right infrahilar
region. A follow-up PA and lateral chest x-ray would be useful if
the patient's symptoms warrant this.
